# Patient Record
Sex: Female | Born: 1980 | Hispanic: No
Health system: Southern US, Academic
[De-identification: ages and names within clinical notes are randomized; demographics above are authoritative.]

## PROBLEM LIST (undated history)

## (undated) ENCOUNTER — Telehealth

## (undated) ENCOUNTER — Ambulatory Visit

## (undated) ENCOUNTER — Encounter

## (undated) ENCOUNTER — Encounter: Attending: Dermatology | Primary: Dermatology

## (undated) ENCOUNTER — Ambulatory Visit: Payer: MEDICAID | Attending: Dermatology | Primary: Dermatology

## (undated) ENCOUNTER — Ambulatory Visit: Payer: PRIVATE HEALTH INSURANCE

## (undated) ENCOUNTER — Ambulatory Visit: Payer: Medicaid (Managed Care)

## (undated) ENCOUNTER — Encounter
Attending: Student in an Organized Health Care Education/Training Program | Primary: Student in an Organized Health Care Education/Training Program

## (undated) ENCOUNTER — Ambulatory Visit: Attending: Gastroenterology | Primary: Gastroenterology

## (undated) ENCOUNTER — Telehealth: Attending: Dermatology | Primary: Dermatology

## (undated) ENCOUNTER — Ambulatory Visit: Payer: MEDICAID

## (undated) ENCOUNTER — Encounter: Attending: Internal Medicine | Primary: Internal Medicine

## (undated) ENCOUNTER — Encounter: Attending: Family | Primary: Family

## (undated) ENCOUNTER — Ambulatory Visit: Attending: Neurology | Primary: Neurology

## (undated) ENCOUNTER — Ambulatory Visit: Payer: MEDICAID | Attending: Retina Specialist | Primary: Retina Specialist

## (undated) ENCOUNTER — Telehealth: Attending: Ambulatory Care | Primary: Ambulatory Care

## (undated) ENCOUNTER — Ambulatory Visit
Payer: PRIVATE HEALTH INSURANCE | Attending: Student in an Organized Health Care Education/Training Program | Primary: Student in an Organized Health Care Education/Training Program

## (undated) ENCOUNTER — Ambulatory Visit
Payer: Medicaid (Managed Care) | Attending: Student in an Organized Health Care Education/Training Program | Primary: Student in an Organized Health Care Education/Training Program

## (undated) ENCOUNTER — Ambulatory Visit: Payer: MEDICAID | Attending: Rheumatology | Primary: Rheumatology

## (undated) ENCOUNTER — Ambulatory Visit: Payer: Medicaid (Managed Care) | Attending: Adult Health | Primary: Adult Health

## (undated) ENCOUNTER — Telehealth: Attending: Adult Health | Primary: Adult Health

## (undated) ENCOUNTER — Encounter: Attending: Neurology | Primary: Neurology

## (undated) ENCOUNTER — Ambulatory Visit: Attending: Physical Medicine & Rehabilitation | Primary: Physical Medicine & Rehabilitation

## (undated) ENCOUNTER — Telehealth
Attending: Student in an Organized Health Care Education/Training Program | Primary: Student in an Organized Health Care Education/Training Program

## (undated) ENCOUNTER — Encounter: Attending: Ambulatory Care | Primary: Ambulatory Care

## (undated) DIAGNOSIS — E079 Disorder of thyroid, unspecified: Secondary | ICD-10-CM

## (undated) DIAGNOSIS — F909 Attention-deficit hyperactivity disorder, unspecified type: Secondary | ICD-10-CM

## (undated) DIAGNOSIS — C9590 Leukemia, unspecified not having achieved remission: Secondary | ICD-10-CM

## (undated) DIAGNOSIS — Z22322 Carrier or suspected carrier of Methicillin resistant Staphylococcus aureus: Secondary | ICD-10-CM

## (undated) DIAGNOSIS — D649 Anemia, unspecified: Secondary | ICD-10-CM

## (undated) DIAGNOSIS — J45909 Unspecified asthma, uncomplicated: Secondary | ICD-10-CM

## (undated) DIAGNOSIS — M549 Dorsalgia, unspecified: Secondary | ICD-10-CM

## (undated) DIAGNOSIS — Z90710 Acquired absence of both cervix and uterus: Secondary | ICD-10-CM

## (undated) DIAGNOSIS — G43909 Migraine, unspecified, not intractable, without status migrainosus: Secondary | ICD-10-CM

## (undated) DIAGNOSIS — M419 Scoliosis, unspecified: Secondary | ICD-10-CM

## (undated) DIAGNOSIS — IMO0002 Reserved for concepts with insufficient information to code with codable children: Secondary | ICD-10-CM

## (undated) DIAGNOSIS — L732 Hidradenitis suppurativa: Secondary | ICD-10-CM

## (undated) DIAGNOSIS — R110 Nausea: Secondary | ICD-10-CM

## (undated) DIAGNOSIS — M329 Systemic lupus erythematosus, unspecified: Secondary | ICD-10-CM

## (undated) DIAGNOSIS — R51 Headache: Secondary | ICD-10-CM

## (undated) DIAGNOSIS — M1990 Unspecified osteoarthritis, unspecified site: Secondary | ICD-10-CM

## (undated) DIAGNOSIS — F32A Depression, unspecified: Secondary | ICD-10-CM

## (undated) DIAGNOSIS — F329 Major depressive disorder, single episode, unspecified: Secondary | ICD-10-CM

## (undated) DIAGNOSIS — R197 Diarrhea, unspecified: Secondary | ICD-10-CM

## (undated) DIAGNOSIS — R222 Localized swelling, mass and lump, trunk: Secondary | ICD-10-CM

## (undated) DIAGNOSIS — I10 Essential (primary) hypertension: Secondary | ICD-10-CM

## (undated) HISTORY — DX: Localized swelling, mass and lump, trunk: R22.2

## (undated) HISTORY — DX: Attention-deficit hyperactivity disorder, unspecified type: F90.9

## (undated) HISTORY — DX: Unspecified osteoarthritis, unspecified site: M19.90

## (undated) HISTORY — DX: Leukemia, unspecified not having achieved remission: C95.90

## (undated) HISTORY — DX: Diarrhea, unspecified: R19.7

## (undated) HISTORY — DX: Essential (primary) hypertension: I10

## (undated) HISTORY — PX: AXILLARY HIDRADENITIS EXCISION: SUR522

## (undated) HISTORY — DX: Headache: R51

## (undated) HISTORY — DX: Scoliosis, unspecified: M41.9

## (undated) HISTORY — DX: Migraine, unspecified, not intractable, without status migrainosus: G43.909

## (undated) HISTORY — DX: Dorsalgia, unspecified: M54.9

## (undated) HISTORY — DX: Nausea: R11.0

## (undated) HISTORY — DX: Acquired absence of both cervix and uterus: Z90.710

## (undated) HISTORY — DX: Anemia, unspecified: D64.9

## (undated) HISTORY — DX: Hidradenitis suppurativa: L73.2

---
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
## 2004-02-01 ENCOUNTER — Encounter (INDEPENDENT_AMBULATORY_CARE_PROVIDER_SITE_OTHER): Payer: Self-pay | Admitting: *Deleted

## 2004-02-01 LAB — CONVERTED CEMR LAB

## 2004-03-27 ENCOUNTER — Encounter: Admission: RE | Admit: 2004-03-27 | Discharge: 2004-03-27 | Payer: Self-pay | Admitting: Family Medicine

## 2004-04-25 ENCOUNTER — Encounter: Admission: RE | Admit: 2004-04-25 | Discharge: 2004-04-25 | Payer: Self-pay | Admitting: Sports Medicine

## 2004-04-27 ENCOUNTER — Encounter: Admission: RE | Admit: 2004-04-27 | Discharge: 2004-04-27 | Payer: Self-pay | Admitting: Family Medicine

## 2004-05-04 ENCOUNTER — Ambulatory Visit (HOSPITAL_COMMUNITY): Admission: RE | Admit: 2004-05-04 | Discharge: 2004-05-04 | Payer: Self-pay | Admitting: Sports Medicine

## 2004-05-29 ENCOUNTER — Encounter: Admission: RE | Admit: 2004-05-29 | Discharge: 2004-05-29 | Payer: Self-pay | Admitting: Family Medicine

## 2004-06-05 ENCOUNTER — Emergency Department (HOSPITAL_COMMUNITY): Admission: EM | Admit: 2004-06-05 | Discharge: 2004-06-05 | Payer: Self-pay | Admitting: Family Medicine

## 2004-06-07 ENCOUNTER — Encounter: Admission: RE | Admit: 2004-06-07 | Discharge: 2004-06-07 | Payer: Self-pay | Admitting: Family Medicine

## 2004-06-08 ENCOUNTER — Ambulatory Visit (HOSPITAL_COMMUNITY): Admission: RE | Admit: 2004-06-08 | Discharge: 2004-06-08 | Payer: Self-pay | Admitting: Sports Medicine

## 2004-06-13 ENCOUNTER — Encounter: Admission: RE | Admit: 2004-06-13 | Discharge: 2004-06-13 | Payer: Self-pay | Admitting: Family Medicine

## 2004-06-19 ENCOUNTER — Ambulatory Visit (HOSPITAL_COMMUNITY): Admission: RE | Admit: 2004-06-19 | Discharge: 2004-06-19 | Payer: Self-pay

## 2004-06-26 ENCOUNTER — Emergency Department (HOSPITAL_COMMUNITY): Admission: EM | Admit: 2004-06-26 | Discharge: 2004-06-26 | Payer: Self-pay | Admitting: Family Medicine

## 2004-07-10 ENCOUNTER — Encounter: Admission: RE | Admit: 2004-07-10 | Discharge: 2004-07-10 | Payer: Self-pay | Admitting: Sports Medicine

## 2004-07-11 ENCOUNTER — Emergency Department (HOSPITAL_COMMUNITY): Admission: EM | Admit: 2004-07-11 | Discharge: 2004-07-11 | Payer: Self-pay | Admitting: Family Medicine

## 2004-08-12 ENCOUNTER — Emergency Department (HOSPITAL_COMMUNITY): Admission: EM | Admit: 2004-08-12 | Discharge: 2004-08-13 | Payer: Self-pay

## 2005-01-22 ENCOUNTER — Emergency Department (HOSPITAL_COMMUNITY): Admission: EM | Admit: 2005-01-22 | Discharge: 2005-01-22 | Payer: Self-pay | Admitting: Family Medicine

## 2005-04-17 ENCOUNTER — Emergency Department (HOSPITAL_COMMUNITY): Admission: EM | Admit: 2005-04-17 | Discharge: 2005-04-17 | Payer: Self-pay | Admitting: Family Medicine

## 2005-07-27 ENCOUNTER — Emergency Department (HOSPITAL_COMMUNITY): Admission: EM | Admit: 2005-07-27 | Discharge: 2005-07-27 | Payer: Self-pay | Admitting: Family Medicine

## 2005-09-18 ENCOUNTER — Emergency Department (HOSPITAL_COMMUNITY): Admission: EM | Admit: 2005-09-18 | Discharge: 2005-09-18 | Payer: Self-pay | Admitting: Emergency Medicine

## 2005-10-30 ENCOUNTER — Emergency Department (HOSPITAL_COMMUNITY): Admission: EM | Admit: 2005-10-30 | Discharge: 2005-10-31 | Payer: Self-pay | Admitting: Emergency Medicine

## 2005-11-23 ENCOUNTER — Ambulatory Visit (HOSPITAL_COMMUNITY): Admission: RE | Admit: 2005-11-23 | Discharge: 2005-11-23 | Payer: Self-pay | Admitting: *Deleted

## 2006-01-07 ENCOUNTER — Ambulatory Visit (HOSPITAL_COMMUNITY): Admission: RE | Admit: 2006-01-07 | Discharge: 2006-01-07 | Payer: Self-pay | Admitting: *Deleted

## 2006-01-23 ENCOUNTER — Emergency Department (HOSPITAL_COMMUNITY): Admission: EM | Admit: 2006-01-23 | Discharge: 2006-01-23 | Payer: Self-pay | Admitting: Family Medicine

## 2006-04-14 ENCOUNTER — Emergency Department (HOSPITAL_COMMUNITY): Admission: AD | Admit: 2006-04-14 | Discharge: 2006-04-14 | Payer: Self-pay | Admitting: Family Medicine

## 2006-04-22 ENCOUNTER — Emergency Department (HOSPITAL_COMMUNITY): Admission: EM | Admit: 2006-04-22 | Discharge: 2006-04-22 | Payer: Self-pay | Admitting: *Deleted

## 2006-05-23 ENCOUNTER — Emergency Department (HOSPITAL_COMMUNITY): Admission: EM | Admit: 2006-05-23 | Discharge: 2006-05-23 | Payer: Self-pay | Admitting: Family Medicine

## 2006-06-16 ENCOUNTER — Inpatient Hospital Stay (HOSPITAL_COMMUNITY): Admission: AD | Admit: 2006-06-16 | Discharge: 2006-06-16 | Payer: Self-pay | Admitting: Obstetrics

## 2006-07-17 ENCOUNTER — Emergency Department (HOSPITAL_COMMUNITY): Admission: EM | Admit: 2006-07-17 | Discharge: 2006-07-17 | Payer: Self-pay | Admitting: Emergency Medicine

## 2006-07-28 ENCOUNTER — Inpatient Hospital Stay (HOSPITAL_COMMUNITY): Admission: AD | Admit: 2006-07-28 | Discharge: 2006-07-28 | Payer: Self-pay | Admitting: Obstetrics

## 2006-08-16 ENCOUNTER — Inpatient Hospital Stay (HOSPITAL_COMMUNITY): Admission: AD | Admit: 2006-08-16 | Discharge: 2006-08-17 | Payer: Self-pay | Admitting: Obstetrics

## 2006-08-30 ENCOUNTER — Inpatient Hospital Stay (HOSPITAL_COMMUNITY): Admission: AD | Admit: 2006-08-30 | Discharge: 2006-09-05 | Payer: Self-pay | Admitting: Obstetrics

## 2006-08-31 ENCOUNTER — Encounter (INDEPENDENT_AMBULATORY_CARE_PROVIDER_SITE_OTHER): Payer: Self-pay | Admitting: *Deleted

## 2006-11-05 ENCOUNTER — Emergency Department (HOSPITAL_COMMUNITY): Admission: EM | Admit: 2006-11-05 | Discharge: 2006-11-05 | Payer: Self-pay | Admitting: Family Medicine

## 2007-02-28 ENCOUNTER — Encounter (INDEPENDENT_AMBULATORY_CARE_PROVIDER_SITE_OTHER): Payer: Self-pay | Admitting: *Deleted

## 2007-03-31 ENCOUNTER — Emergency Department (HOSPITAL_COMMUNITY): Admission: EM | Admit: 2007-03-31 | Discharge: 2007-03-31 | Payer: Self-pay | Admitting: Family Medicine

## 2007-05-12 ENCOUNTER — Emergency Department (HOSPITAL_COMMUNITY): Admission: EM | Admit: 2007-05-12 | Discharge: 2007-05-12 | Payer: Self-pay | Admitting: Emergency Medicine

## 2007-10-26 ENCOUNTER — Emergency Department (HOSPITAL_COMMUNITY): Admission: EM | Admit: 2007-10-26 | Discharge: 2007-10-26 | Payer: Self-pay | Admitting: Emergency Medicine

## 2008-02-05 ENCOUNTER — Emergency Department (HOSPITAL_COMMUNITY): Admission: EM | Admit: 2008-02-05 | Discharge: 2008-02-05 | Payer: Self-pay | Admitting: Family Medicine

## 2008-03-26 ENCOUNTER — Emergency Department (HOSPITAL_COMMUNITY): Admission: EM | Admit: 2008-03-26 | Discharge: 2008-03-26 | Payer: Self-pay | Admitting: Family Medicine

## 2008-06-13 ENCOUNTER — Emergency Department (HOSPITAL_COMMUNITY): Admission: EM | Admit: 2008-06-13 | Discharge: 2008-06-13 | Payer: Self-pay | Admitting: Emergency Medicine

## 2008-06-17 ENCOUNTER — Other Ambulatory Visit: Admission: RE | Admit: 2008-06-17 | Discharge: 2008-06-17 | Payer: Self-pay | Admitting: Obstetrics & Gynecology

## 2008-06-23 ENCOUNTER — Emergency Department (HOSPITAL_COMMUNITY): Admission: EM | Admit: 2008-06-23 | Discharge: 2008-06-23 | Payer: Self-pay | Admitting: Emergency Medicine

## 2008-06-30 ENCOUNTER — Encounter: Payer: Self-pay | Admitting: Obstetrics & Gynecology

## 2008-06-30 ENCOUNTER — Inpatient Hospital Stay (HOSPITAL_COMMUNITY): Admission: RE | Admit: 2008-06-30 | Discharge: 2008-07-02 | Payer: Self-pay | Admitting: Obstetrics & Gynecology

## 2008-06-30 HISTORY — PX: ABDOMINAL HYSTERECTOMY: SHX81

## 2008-07-03 ENCOUNTER — Inpatient Hospital Stay (HOSPITAL_COMMUNITY): Admission: AD | Admit: 2008-07-03 | Discharge: 2008-07-03 | Payer: Self-pay | Admitting: Obstetrics & Gynecology

## 2008-07-24 ENCOUNTER — Emergency Department (HOSPITAL_COMMUNITY): Admission: EM | Admit: 2008-07-24 | Discharge: 2008-07-24 | Payer: Self-pay | Admitting: Emergency Medicine

## 2008-07-25 ENCOUNTER — Emergency Department (HOSPITAL_COMMUNITY): Admission: EM | Admit: 2008-07-25 | Discharge: 2008-07-26 | Payer: Self-pay | Admitting: Emergency Medicine

## 2008-08-30 ENCOUNTER — Emergency Department (HOSPITAL_COMMUNITY): Admission: EM | Admit: 2008-08-30 | Discharge: 2008-08-30 | Payer: Self-pay | Admitting: Family Medicine

## 2008-09-21 ENCOUNTER — Emergency Department (HOSPITAL_COMMUNITY): Admission: EM | Admit: 2008-09-21 | Discharge: 2008-09-21 | Payer: Self-pay | Admitting: Family Medicine

## 2008-10-12 ENCOUNTER — Emergency Department (HOSPITAL_COMMUNITY): Admission: EM | Admit: 2008-10-12 | Discharge: 2008-10-12 | Payer: Self-pay | Admitting: Emergency Medicine

## 2008-12-07 ENCOUNTER — Emergency Department (HOSPITAL_COMMUNITY): Admission: EM | Admit: 2008-12-07 | Discharge: 2008-12-07 | Payer: Self-pay | Admitting: Emergency Medicine

## 2008-12-31 DIAGNOSIS — Z22322 Carrier or suspected carrier of Methicillin resistant Staphylococcus aureus: Secondary | ICD-10-CM

## 2008-12-31 HISTORY — DX: Carrier or suspected carrier of methicillin resistant Staphylococcus aureus: Z22.322

## 2009-02-07 ENCOUNTER — Emergency Department (HOSPITAL_COMMUNITY): Admission: EM | Admit: 2009-02-07 | Discharge: 2009-02-07 | Payer: Self-pay | Admitting: Emergency Medicine

## 2009-02-11 ENCOUNTER — Inpatient Hospital Stay (HOSPITAL_COMMUNITY): Admission: EM | Admit: 2009-02-11 | Discharge: 2009-02-15 | Payer: Self-pay | Admitting: Internal Medicine

## 2009-02-12 ENCOUNTER — Ambulatory Visit: Payer: Self-pay | Admitting: Internal Medicine

## 2009-05-24 ENCOUNTER — Emergency Department (HOSPITAL_COMMUNITY): Admission: EM | Admit: 2009-05-24 | Discharge: 2009-05-24 | Payer: Self-pay | Admitting: Family Medicine

## 2009-07-19 ENCOUNTER — Emergency Department (HOSPITAL_COMMUNITY): Admission: EM | Admit: 2009-07-19 | Discharge: 2009-07-19 | Payer: Self-pay | Admitting: Emergency Medicine

## 2009-07-26 ENCOUNTER — Emergency Department (HOSPITAL_COMMUNITY): Admission: EM | Admit: 2009-07-26 | Discharge: 2009-07-26 | Payer: Self-pay | Admitting: Emergency Medicine

## 2009-08-25 ENCOUNTER — Emergency Department (HOSPITAL_COMMUNITY): Admission: EM | Admit: 2009-08-25 | Discharge: 2009-08-25 | Payer: Self-pay | Admitting: Emergency Medicine

## 2009-10-04 ENCOUNTER — Emergency Department (HOSPITAL_COMMUNITY): Admission: EM | Admit: 2009-10-04 | Discharge: 2009-10-04 | Payer: Self-pay | Admitting: Emergency Medicine

## 2009-12-02 ENCOUNTER — Ambulatory Visit (HOSPITAL_COMMUNITY): Admission: RE | Admit: 2009-12-02 | Discharge: 2009-12-02 | Payer: Self-pay | Admitting: General Surgery

## 2010-01-19 ENCOUNTER — Encounter: Payer: Self-pay | Admitting: Family Medicine

## 2010-02-15 ENCOUNTER — Ambulatory Visit (HOSPITAL_BASED_OUTPATIENT_CLINIC_OR_DEPARTMENT_OTHER): Admission: RE | Admit: 2010-02-15 | Discharge: 2010-02-15 | Payer: Self-pay | Admitting: General Surgery

## 2010-02-19 ENCOUNTER — Emergency Department (HOSPITAL_COMMUNITY): Admission: EM | Admit: 2010-02-19 | Discharge: 2010-02-19 | Payer: Self-pay | Admitting: Emergency Medicine

## 2010-03-17 ENCOUNTER — Encounter: Payer: Self-pay | Admitting: Family Medicine

## 2010-03-17 ENCOUNTER — Ambulatory Visit: Payer: Self-pay | Admitting: Family Medicine

## 2010-03-17 DIAGNOSIS — K219 Gastro-esophageal reflux disease without esophagitis: Secondary | ICD-10-CM | POA: Insufficient documentation

## 2010-04-13 ENCOUNTER — Encounter: Payer: Self-pay | Admitting: Family Medicine

## 2010-04-18 ENCOUNTER — Encounter: Payer: Self-pay | Admitting: Family Medicine

## 2010-04-18 ENCOUNTER — Ambulatory Visit: Payer: Self-pay | Admitting: Family Medicine

## 2010-04-18 DIAGNOSIS — F17200 Nicotine dependence, unspecified, uncomplicated: Secondary | ICD-10-CM | POA: Insufficient documentation

## 2010-04-18 LAB — CONVERTED CEMR LAB
BUN: 14 mg/dL (ref 6–23)
CO2: 21 meq/L (ref 19–32)
Calcium: 8.9 mg/dL (ref 8.4–10.5)
Chlamydia, DNA Probe: NEGATIVE
Chloride: 103 meq/L (ref 96–112)
Creatinine, Ser: 0.66 mg/dL (ref 0.40–1.20)
GC Probe Amp, Genital: NEGATIVE
Glucose, Bld: 91 mg/dL (ref 70–99)
HCV Ab: NEGATIVE
Hep B S Ab: NEGATIVE
Hepatitis B Surface Ag: NEGATIVE
Pap Smear: NEGATIVE
Potassium: 3.7 meq/L (ref 3.5–5.3)
Sodium: 135 meq/L (ref 135–145)

## 2010-04-20 ENCOUNTER — Ambulatory Visit: Payer: Self-pay | Admitting: Family Medicine

## 2010-04-23 ENCOUNTER — Emergency Department (HOSPITAL_COMMUNITY): Admission: EM | Admit: 2010-04-23 | Discharge: 2010-04-23 | Payer: Self-pay | Admitting: Emergency Medicine

## 2010-04-25 ENCOUNTER — Encounter: Payer: Self-pay | Admitting: Family Medicine

## 2010-05-01 ENCOUNTER — Encounter: Payer: Self-pay | Admitting: Family Medicine

## 2010-05-01 ENCOUNTER — Ambulatory Visit: Payer: Self-pay | Admitting: Family Medicine

## 2010-05-01 DIAGNOSIS — F909 Attention-deficit hyperactivity disorder, unspecified type: Secondary | ICD-10-CM | POA: Insufficient documentation

## 2010-05-08 ENCOUNTER — Telehealth: Payer: Self-pay | Admitting: *Deleted

## 2010-05-15 ENCOUNTER — Telehealth: Payer: Self-pay | Admitting: *Deleted

## 2010-05-31 ENCOUNTER — Ambulatory Visit: Payer: Self-pay | Admitting: Family Medicine

## 2010-06-28 ENCOUNTER — Ambulatory Visit: Payer: Self-pay | Admitting: Family Medicine

## 2010-06-28 DIAGNOSIS — F341 Dysthymic disorder: Secondary | ICD-10-CM | POA: Insufficient documentation

## 2010-07-17 ENCOUNTER — Ambulatory Visit: Payer: Self-pay | Admitting: Family Medicine

## 2010-07-24 ENCOUNTER — Telehealth: Payer: Self-pay | Admitting: Family Medicine

## 2010-08-15 ENCOUNTER — Ambulatory Visit: Payer: Self-pay | Admitting: Family Medicine

## 2010-08-15 ENCOUNTER — Telehealth: Payer: Self-pay | Admitting: Family Medicine

## 2010-08-15 DIAGNOSIS — M549 Dorsalgia, unspecified: Secondary | ICD-10-CM

## 2010-08-15 DIAGNOSIS — G8929 Other chronic pain: Secondary | ICD-10-CM | POA: Insufficient documentation

## 2010-08-28 ENCOUNTER — Telehealth: Payer: Self-pay | Admitting: Family Medicine

## 2010-08-31 ENCOUNTER — Telehealth: Payer: Self-pay | Admitting: Family Medicine

## 2010-09-01 ENCOUNTER — Ambulatory Visit: Payer: Self-pay | Admitting: Family Medicine

## 2010-09-01 ENCOUNTER — Encounter: Payer: Self-pay | Admitting: Family Medicine

## 2010-09-01 DIAGNOSIS — R609 Edema, unspecified: Secondary | ICD-10-CM | POA: Insufficient documentation

## 2010-09-01 DIAGNOSIS — R000 Tachycardia, unspecified: Secondary | ICD-10-CM | POA: Insufficient documentation

## 2010-09-01 DIAGNOSIS — I10 Essential (primary) hypertension: Secondary | ICD-10-CM | POA: Insufficient documentation

## 2010-09-01 LAB — CONVERTED CEMR LAB
ALT: <8 units/L (ref 0–35)
AST: 11 units/L (ref 0–37)
Albumin: 4.3 g/dL (ref 3.5–5.2)
Alkaline Phosphatase: 62 units/L (ref 39–117)
BUN: 13 mg/dL (ref 6–23)
CO2: 22 meq/L (ref 19–32)
Calcium: 9.1 mg/dL (ref 8.4–10.5)
Chloride: 106 meq/L (ref 96–112)
Cholesterol: 141 mg/dL (ref 0–200)
Creatinine, Ser: 0.76 mg/dL (ref 0.40–1.20)
Glucose, Bld: 86 mg/dL (ref 70–99)
HCT: 44.8 % (ref 36.0–46.0)
HDL: 29 mg/dL — ABNORMAL LOW (ref >39)
Hemoglobin: 15.1 g/dL — ABNORMAL HIGH (ref 12.0–15.0)
LDL Cholesterol: 93 mg/dL (ref 0–99)
MCHC: 33.7 g/dL (ref 30.0–36.0)
MCV: 85.7 fL (ref 78.0–100.0)
Platelets: 176 10*3/uL (ref 150–400)
Potassium: 3.9 meq/L (ref 3.5–5.3)
RBC: 5.23 M/uL — ABNORMAL HIGH (ref 3.87–5.11)
RDW: 14.1 % (ref 11.5–15.5)
Sodium: 136 meq/L (ref 135–145)
TSH: 1.906 microintl units/mL (ref 0.350–4.500)
Total Bilirubin: 0.3 mg/dL (ref 0.3–1.2)
Total CHOL/HDL Ratio: 4.9
Total Protein: 7 g/dL (ref 6.0–8.3)
Triglycerides: 96 mg/dL (ref <150)
VLDL: 19 mg/dL (ref 0–40)
WBC: 6.3 10*3/uL (ref 4.0–10.5)

## 2010-09-06 ENCOUNTER — Encounter: Payer: Self-pay | Admitting: Family Medicine

## 2010-09-06 ENCOUNTER — Ambulatory Visit: Payer: Self-pay | Admitting: Family Medicine

## 2010-09-06 LAB — CONVERTED CEMR LAB
BUN: 12 mg/dL (ref 6–23)
CO2: 22 meq/L (ref 19–32)
Calcium: 8.9 mg/dL (ref 8.4–10.5)
Chloride: 105 meq/L (ref 96–112)
Creatinine, Ser: 0.76 mg/dL (ref 0.40–1.20)
Glucose, Bld: 73 mg/dL (ref 70–99)
Potassium: 4 meq/L (ref 3.5–5.3)
Sodium: 137 meq/L (ref 135–145)

## 2010-09-08 ENCOUNTER — Telehealth: Payer: Self-pay | Admitting: Family Medicine

## 2010-09-11 ENCOUNTER — Encounter
Admission: RE | Admit: 2010-09-11 | Discharge: 2010-11-01 | Payer: Self-pay | Source: Home / Self Care | Admitting: Family Medicine

## 2010-09-27 ENCOUNTER — Encounter: Payer: Self-pay | Admitting: Family Medicine

## 2010-09-28 ENCOUNTER — Ambulatory Visit: Payer: Self-pay | Admitting: Family Medicine

## 2010-11-16 ENCOUNTER — Emergency Department (HOSPITAL_COMMUNITY): Admission: EM | Admit: 2010-11-16 | Discharge: 2010-11-16 | Payer: Self-pay | Admitting: Emergency Medicine

## 2010-12-05 ENCOUNTER — Encounter: Payer: Self-pay | Admitting: Family Medicine

## 2010-12-07 ENCOUNTER — Ambulatory Visit: Payer: Self-pay

## 2010-12-25 ENCOUNTER — Emergency Department (HOSPITAL_COMMUNITY)
Admission: EM | Admit: 2010-12-25 | Discharge: 2010-12-26 | Payer: Self-pay | Source: Home / Self Care | Admitting: Emergency Medicine

## 2011-01-08 ENCOUNTER — Ambulatory Visit: Admit: 2011-01-08 | Payer: Self-pay

## 2011-01-21 ENCOUNTER — Encounter: Payer: Self-pay | Admitting: Sports Medicine

## 2011-01-30 NOTE — Progress Notes (Signed)
Summary: Lab Res  Phone Note Call from Patient Call back at Home Phone [phone]   Caller: Patient Summary of Call: Checking on lab work from Wednesday. Initial call taken by: Clydell Hakim,  September 08, 2010 9:26 AM  Follow-up for Phone Call        will forward to MD. Follow-up by: Theresia Lo RN,  September 08, 2010 12:53 PM  Additional Follow-up for Phone Call Additional follow up Details #1::        all normal. Additional Follow-up by: Helane Rima DO,  September 08, 2010 3:21 PM    Additional Follow-up for Phone Call Additional follow up Details #2::    patient notified. Follow-up by: Theresia Lo RN,  September 11, 2010 10:17 AM

## 2011-01-30 NOTE — Miscellaneous (Signed)
Summary: ROI  ROI   Imported By: De Nurse 05/19/2010 16:42:33  _____________________________________________________________________  External Attachment:    Type:   Image     Comment:   External Document

## 2011-01-30 NOTE — Progress Notes (Signed)
Summary: Rx Prob  Phone Note Call from Patient   Caller: Patient Summary of Call: Pt at pharmacy and says that rx is not there that Dr. Edmonia James sent in to [facility]. Initial call taken by: Clydell Hakim,  August 15, 2010 4:32 PM  Follow-up for Phone Call        to pcp to complete Follow-up by: Golden Circle RN,  August 15, 2010 4:56 PM  Additional Follow-up for Phone Call Additional follow up Details #1::        Rx sent electronically- now signed.

## 2011-01-30 NOTE — Miscellaneous (Signed)
  Clinical Lists Changes  Problems: Removed problem of ANXIETY STATE, UNSPECIFIED (ICD-300.00) Removed problem of FATIGUE (ICD-780.79) Removed problem of ANXIETY (ICD-300.00)      Allergies: 1)  ! Sulfa 2)  Percocet
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
## 2011-01-30 NOTE — Progress Notes (Signed)
Summary: triage  Phone Note Call from Patient Call back at Home Phone [phone]   Caller: Patient Summary of Call: feet and ankles swollen x 1 wk.  not sure if she needs to come in Initial call taken by: De Nurse,  August 31, 2010 11:51 AM  Follow-up for Phone Call        LM Follow-up by: Golden Circle RN,  August 31, 2010 12:10 PM  Additional Follow-up for Phone Call Additional follow up Details #1::        states both feet & ankles are swollen. her bp was up she states. cannot come in today. appt at 8:30am. advised no salt or salty foods. drink water not soda & keep feet elevated Additional Follow-up by: Golden Circle RN,  August 31, 2010 12:28 PM

## 2011-01-30 NOTE — Consult Note (Signed)
Summary: [facility]   Imported By: Clydell Hakim 04/20/2010 13:46:13  _____________________________________________________________________  External Attachment:    Type:   Image     Comment:   External Document

## 2011-01-30 NOTE — Progress Notes (Signed)
Summary: rx req  Phone Note Call from Patient Call back at Home Phone [phone]   Caller: Patient Summary of Call: Needs new rx for Ritalin dosage was upped to 10mg s. Initial call taken by: Clydell Hakim,  July 24, 2010 10:25 AM  Follow-up for Phone Call        fwd. to dr.Milah Recht Follow-up by: Arlyss Repress CMA,,  July 24, 2010 11:22 AM  Additional Follow-up for Phone Call Additional follow up Details #1::        rx refilled.New Rx: METHYLPHENIDATE HCL 10 MG TABS (METHYLPHENIDATE HCL) Take 1 tablet in AM and 1 tablet at 2pm for ADHD  #60 x 0      New/Updated Medications: METHYLPHENIDATE HCL 10 MG TABS (METHYLPHENIDATE HCL) Take 1 tablet in AM and 1 tablet at 2pm for ADHD Prescriptions: METHYLPHENIDATE HCL 10 MG TABS (METHYLPHENIDATE HCL) Take 1 tablet in AM and 1 tablet at 2pm for ADHD  #60 x 0   Entered and Authorized by:   Ellin Mayhew MD   Signed by:   Ellin Mayhew MD on 07/27/2010   Method used:   Print then Give to Patient   RxID:   [id number]

## 2011-01-30 NOTE — Progress Notes (Signed)
Summary: refill request  Phone Note Refill Request Message from:  Patient  Refills Requested: Medication #1:  METHYLPHENIDATE HCL 10 MG TABS Take 1 tablet in AM and 1 tablet at 2pm for ADHD Please call when ready  Initial call taken by: De Nurse,  August 28, 2010 9:06 AM  Follow-up for Phone Call        dosage decreased at visit on 9/2. Rx refilled at that visit. Ellin Mayhew MD  September 07, 2010 4:25 PM

## 2011-01-30 NOTE — Consult Note (Signed)
Summary: [facility]   Imported By: De Nurse 11/21/2010 10:50:22  _____________________________________________________________________  External Attachment:    Type:   Image     Comment:   External Document
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
## 2011-01-30 NOTE — Assessment & Plan Note (Signed)
Summary: ADHD/anxiety   Vital Signs:  Patient profile:   30 year old female Height:      60.25 inches Weight:      141.9 pounds BMI:     27.58 Temp:     98.2 degrees F oral Pulse rate:   98 / minute BP sitting:   123 / 88  (left arm) Cuff size:   regular  Vitals Entered By: Garen Grams LPN (September 28, 2010 9:56 AM) CC: f/u meds Is Patient Diabetic? No Pain Assessment Patient in pain? no        Primary Care Provider:  Ellin Mayhew MD  CC:  f/u meds.  History of Present Illness: ADHD: Pt states that she initially thought that the medicine was helping but now she is convinced that it is not really helping her concentration.  Since starting the ritilan she has increased anxiety, especially at school.  She has racing thoughts and has a harder time focusing.  She also endorses irritablility.  Her spouse wants her to stop taking the medicine because he feels like this all got worse when she started on the ritalin. She is concerned that the ritalin is the medicine causing the increase in the heart rate.  Pt would like to stop the ritalin.  Anxiety/depression: Pt states that she has always had an anxiety and depression problem.  Has never taking any medication except for the amitriptyline.  She feels like the amitriptyline has really helped her depression and that her depression is really not an issue for her right now.  Yet, her anxiety has become acutely worsened since she started school and in particular since she started the ritalin.  pt has never had therapy for her anxiety or depression.  Habits & Providers  Alcohol-Tobacco-Diet     Alcohol drinks/day: 0     Tobacco Status: current     Tobacco Counseling: to quit use of tobacco products     Cigarette Packs/Day: 1.0  Current Medications (verified): 1)  Fioricet 50-325-40 Mg Tabs (Butalbital-Apap-Caffeine) .Marland Kitchen.. 1 Tab By Mouth Q 6 Hrs As Needed Headache 2)  Amitriptyline Hcl 50 Mg Tabs (Amitriptyline Hcl) .Marland Kitchen.. 1 Tab By  Mouth Qhs For Depression 3)  Flexeril 5 Mg Tabs (Cyclobenzaprine Hcl) .... Take 1 Tablet Three Times A Day As Needed For Back Pain 4)  Lisinopril 10 Mg  Tabs (Lisinopril) .... Take 1 Tab By Mouth Daily  Allergies (verified): 1)  ! Sulfa 2)  Percocet  Review of Systems       No fever. No SOB. No Si or Hi. as per hpi  Physical Exam  General:  VSS Well-developed,well-nourished,in no acute distress; alert,appropriate and cooperative throughout examination Lungs:  Normal respiratory effort, chest expands symmetrically.  Heart:  Normal rate and regular rhythm. S1 and S2 normal without gallop, murmur, click, rub or other extra sounds. Extremities:  no edema Skin:  Intact without suspicious lesions or rashes Psych:  Cognition and judgment appear intact. Alert and cooperative with normal attention span and concentration. No apparent delusions, illusions, hallucinations   Impression & Recommendations:  Problem # 1:  ADHD (ICD-314.01) Pt requests to stop ritalin.  I think that this may be a good idea since she is having increased anxiety, increased hr (98 today, also see previous visits), and increased irratability with very minimal improvement in concentration per pt.  Talked with pharmacy who said it would be safe for pt to stop without wean at this dosage.  Pt to stop taking today.  Pt  plans to contact Dr. Pascal Lux for an appt to discuss coping skills for test anxiety.  And for general mental health eval since she has never seen a psychologist for anxiety/depression or ADHD diagnosis.   Pt would like to focus on behavioral change since she feels her situation has worsened with the ritalin.   Orders: [facility]- Est  Level 4 ([address])  Problem # 2:  ANXIETY (ICD-300.00) Pt will continue to take amitriptyline since she feels this has helped her depression that she has had in the past.  If elevated hr continues may consider switching her to another antidepressant.   Her updated medication list for this  problem includes:    Amitriptyline Hcl 50 Mg Tabs (Amitriptyline hcl) .Marland Kitchen... 1 tab by mouth qhs for depression  Orders: [facility]- Est  Level 4 ([address])  Complete Medication List: 1)  Fioricet 50-325-40 Mg Tabs (Butalbital-apap-caffeine) .Marland Kitchen.. 1 tab by mouth q 6 hrs as needed headache 2)  Amitriptyline Hcl 50 Mg Tabs (Amitriptyline hcl) .Marland Kitchen.. 1 tab by mouth qhs for depression 3)  Flexeril 5 Mg Tabs (Cyclobenzaprine hcl) .... Take 1 tablet three times a day as needed for back pain 4)  Lisinopril 10 Mg Tabs (Lisinopril) .... Take 1 tab by mouth daily  Patient Instructions: 1)  I recommend you make an appt with Dr. Pascal Lux, our therapist here at the clinic,  to work on study skills in the setting of your ADHD, anxiety, and depression. 2)  GET A GOOD NIGHTS REST!!! 3)  Stop taking medication for ADHD.

## 2011-01-30 NOTE — Assessment & Plan Note (Signed)
Summary: f/up ritalin   Vital Signs:  Patient profile:   30 year old female Weight:      141.4 pounds Pulse rate:   88 / minute BP sitting:   116 / 80  (right arm)  Vitals Entered By: Arlyss Repress CMA, (July 17, 2010 1:33 PM) CC: discuss Ritalin. started on 5mg  06-28-10. feels no difference. Is Patient Diabetic? No Pain Assessment Patient in pain? no        Primary Care Provider:  Ardeen Garland  MD  CC:  discuss Ritalin. started on 5mg  06-28-10. feels no difference.Marland Kitchen  History of Present Illness: Pt states that she is here to follow up on ritalin dosage.  She statest that she has not had any improvement in focus or ability to concentrate.  She also denies any adverse side effect of the medication. No h/a, no insomnia, no irritablity, no decrease in appetite.   Habits & Providers  Alcohol-Tobacco-Diet     Tobacco Status: current     Tobacco Counseling: to quit use of tobacco products  Current Medications (verified): 1)  Fioricet 50-325-40 Mg Tabs (Butalbital-Apap-Caffeine) .Marland Kitchen.. 1 Tab By Mouth Q 6 Hrs As Needed Headache 2)  Amitriptyline Hcl 50 Mg Tabs (Amitriptyline Hcl) .Marland Kitchen.. 1 Tab By Mouth Qhs For Depression 3)  Methylphenidate Hcl 5 Mg Tabs (Methylphenidate Hcl) .Marland Kitchen.. 1 Tab By Mouth Q Am and 1 Tab By Mouth Q 2pm For Adhd  Allergies (verified): No Known Drug Allergies  Review of Systems       ros per hpi  Physical Exam  General:  VSS Well-developed,well-nourished,in no acute distress; alert,appropriate and cooperative throughout examination Lungs:  Normal respiratory effort, chest expands symmetrically. Msk:  normal gait Extremities:  no edema Psych:  Oriented X3, memory intact for recent and remote, normally interactive, good eye contact, not anxious appearing, not depressed appearing, not agitated, not suicidal, and not homicidal.     Impression & Recommendations:  Problem # 1:  ADHD (ICD-314.01) Increased doseage of ritalin to 10mg  at 8am and again mid  afternoon.  Will have pt return in 2-3 weeks to reassess how she is doing with this dosage.  If we are able to find a dosage that works well for her, i will consider switching her to something more long acting like concerta.  Will reconsider this once we find a dosage that works for her.   Orders: [facility]- Est Level  3 ([address])  Complete Medication List: 1)  Fioricet 50-325-40 Mg Tabs (Butalbital-apap-caffeine) .Marland Kitchen.. 1 tab by mouth q 6 hrs as needed headache 2)  Amitriptyline Hcl 50 Mg Tabs (Amitriptyline hcl) .Marland Kitchen.. 1 tab by mouth qhs for depression 3)  Methylphenidate Hcl 5 Mg Tabs (Methylphenidate hcl) .Marland Kitchen.. 1 tab by mouth q am and 1 tab by mouth q 2pm for adhd  Patient Instructions: 1)  return in 2-3 weeks for follow up appt. 2)  Take ritalin 10mg - once at 8am and again mid afternoon. 3)  Look out for any side effects or benefits in order to report back to me.  4)  Very nice meeting you today!

## 2011-01-30 NOTE — Assessment & Plan Note (Signed)
Summary: back pain/adhd   Vital Signs:  Patient profile:   30 year old female Height:      60.25 inches Weight:      141 pounds BMI:     27.41 Temp:     98.4 degrees F oral Pulse rate:   92 / minute BP sitting:   134 / 97  (left arm) Cuff size:   regular  Vitals Entered By: Tessie Fass CMA (August 15, 2010 2:10 PM) CC: F/U back pain Is Patient Diabetic? No Pain Assessment Patient in pain? yes     Location: back Intensity: 8   Primary Care Provider:  Ardeen Garland  MD  CC:  F/U back pain.  History of Present Illness: chronic back pain: Pt reports back pain x years.  Recently moved to another home and back pain increased.  Has seen Dr. Georgiana Shore in past and it was mentioned that she might be a good canidate for PT.  Pt is requesting a PT consult today.  no fever.  No urinary or stool incontience.    Habits & Providers  Alcohol-Tobacco-Diet     Tobacco Status: current     Tobacco Counseling: to quit use of tobacco products     Cigarette Packs/Day: 1.5  Current Medications (verified): 1)  Fioricet 50-325-40 Mg Tabs (Butalbital-Apap-Caffeine) .Marland Kitchen.. 1 Tab By Mouth Q 6 Hrs As Needed Headache 2)  Amitriptyline Hcl 50 Mg Tabs (Amitriptyline Hcl) .Marland Kitchen.. 1 Tab By Mouth Qhs For Depression 3)  Methylphenidate Hcl 10 Mg Tabs (Methylphenidate Hcl) .... Take 1 Tablet in Am and 1 Tablet At 2pm For Adhd 4)  Flexeril 5 Mg Tabs (Cyclobenzaprine Hcl) .... Take 1 Tablet Three Times A Day As Needed For Back Pain  Allergies (verified): No Known Drug Allergies  Social History: Packs/Day:  1.5  Review of Systems       as per hpi  Physical Exam  General:  Well-developed,well-nourished,in no acute distress; alert,appropriate and cooperative throughout examination Lungs:  Normal respiratory effort, chest expands symmetrically. Lungs are clear to auscultation, no crackles or wheezes. Heart:  Normal rate and regular rhythm. S1 and S2 normal without gallop, murmur, click, rub or other extra  sounds. Msk:  tenderness to palpation over lower back paraspinal muscles.  normal rom.  strength, sensation, and reflexes equal bilateral.   Impression & Recommendations:  Problem # 1:  BACK PAIN (ICD-724.5) pt can take motrin for pain as she has been as well as flexeril for muscle relaxation.  reviewed side effects of flexeril.  Pt states understanding.  This is for short term use only for acute relief until pt can be established in PT and work on Therapist, art the weakened areas.  Pt consult placed.   Her updated medication list for this problem includes:    Fioricet 50-325-40 Mg Tabs (Butalbital-apap-caffeine) .Marland Kitchen... 1 tab by mouth q 6 hrs as needed headache    Flexeril 5 Mg Tabs (Cyclobenzaprine hcl) .Marland Kitchen... Take 1 tablet three times a day as needed for back pain  Orders: [facility]- Est  Level 4 ([address]) Physical Therapy Referral (PT)  Problem # 2:  TOBACCO USER (ICD-305.1) Pt couseled to stop smoking.  Showed some interest in smoking cessation classes.  pt given handout.  Orders: [facility]- Est  Level 4 ([address]8119)  Problem # 3:  ADHD (ICD-314.01) pt doing well with current ritalin dosage.  Talked about switching to long acting concerta but pt concerned that insurance won't cover and it will be more expensive.  Since pt is happy  with symptom relief currently will continue with current regimen.    Orders: [facility]- Est  Level 4 ([address])  Complete Medication List: 1)  Fioricet 50-325-40 Mg Tabs (Butalbital-apap-caffeine) .Marland Kitchen.. 1 tab by mouth q 6 hrs as needed headache 2)  Amitriptyline Hcl 50 Mg Tabs (Amitriptyline hcl) .Marland Kitchen.. 1 tab by mouth qhs for depression 3)  Methylphenidate Hcl 10 Mg Tabs (Methylphenidate hcl) .... Take 1 tablet in am and 1 tablet at 2pm for adhd 4)  Flexeril 5 Mg Tabs (Cyclobenzaprine hcl) .... Take 1 tablet three times a day as needed for back pain  Patient Instructions: 1)  Will contact you with physical therapy appt. 2)  Take flexeril as directed until your PT appt.  Remember  side effects of drowsiness.  3)  return as needed for follow up. Prescriptions: FLEXERIL 5 MG TABS (CYCLOBENZAPRINE HCL) take 1 tablet three times a day as needed for back pain  #45 x 0   Entered and Authorized by:   Ellin Mayhew MD   Signed by:   Ellin Mayhew MD on 08/16/2010   Method used:   Electronically to        [facility] (retail)       [address]       [place], [place]  [address]       Ph: [phone]       Fax: [phone]   RxID:   [id number]    Prevention & Chronic Care Immunizations   Influenza vaccine: Not documented    Tetanus booster: 07/01/2003: Done.    Pneumococcal vaccine: Not documented  Other Screening   Pap smear: NEGATIVE FOR INTRAEPITHELIAL LESIONS OR MALIGNANCY.  (04/18/2010)   Pap smear due: 04/19/2011   Smoking status: current  (08/15/2010)   Smoking cessation counseling: yes  (08/15/2010)

## 2011-01-30 NOTE — Assessment & Plan Note (Signed)
Summary: needs meds,df   Vital Signs:  Patient profile:   30 year old female Weight:      137 pounds Pulse rate:   83 / minute BP sitting:   129 / 93  (right arm)  Vitals Entered By: Arlyss Repress CMA, (May 01, 2010 11:13 AM) CC: f/up psychologist. recommendation for meds. ADHD. very stressed due to job loss. Is Patient Diabetic? No Pain Assessment Patient in pain? no        Primary Care Provider:  Ardeen Garland  MD  CC:  f/up psychologist. recommendation for meds. ADHD. very stressed due to job loss.Marland Kitchen  History of Present Illness: [name] comes in to discuss ADHD, follow-up BP and poison ivy. 1) ADHD - states was diagnosed with this at [facility] by [employer] Cowboy.  States she called their office asking them to fax records here but they have not yet been received.  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 2) BP - hypertensive after last pregnancy.  Lisinopril-HCTZ stopped at last appt because she hadn't been taking it and her BP was good.  Here for recheck.  Diastolic borderline but otherwise okay. 3) Poison ivy - seen her for poison ivy.  GIven 5 d steroid burst.  Helped some but not completely so went to urgent care where she got a "shot" and it is almost gone now.   Habits & Providers  Alcohol-Tobacco-Diet     Tobacco Status: current     Tobacco Counseling: to quit use of tobacco products     Cigarette Packs/Day: 1.0  Allergies: No Known Drug Allergies  Physical Exam  General:  VS noted. thin, alert, NAD Skin:  resolving erythematous rash on flexor areas of forearms.  FAce clear Psych:  Oriented X3, memory intact for recent and remote, normally interactive, good eye contact, not anxious appearing, and not depressed appearing.     Impression & Recommendations:  Problem # 1:  ADHD (ICD-314.01) Assessment New  Still awaiting records.  SHe filled out ROI which will be faxed to their office.  She will erturn for  discussion of medication after I receive the records.   Orders: [facility]- Est Level  3 ([address])  Problem # 2:  CONTACT DERMATITIS&OTHER ECZEMA DUE TO PLANTS (ICD-692.6) Assessment: Improved  IMproved Her updated medication list for this problem includes:    Prednisone 50 Mg Tabs (Prednisone) .Marland Kitchen... 1 by mouth daily x 5 days  Orders: [facility]- Est Level  3 ([address])  Problem # 3:  ELEVATED BLOOD PRESSURE (ICD-796.2) Assessment: Unchanged  Still at normal range.  REcheck again at next visit.   Orders: [facility]- Est Level  3 ([address])  Complete Medication List: 1)  Omeprazole 40 Mg Cpdr (Omeprazole) .Marland Kitchen.. 1 tab by mouth daily 2)  Ondansetron Hcl 4 Mg Tabs (Ondansetron hcl) .Marland Kitchen.. 1 tab by mouth q 6 hrs as needed nausea 3)  Prednisone 50 Mg Tabs (Prednisone) .Marland Kitchen.. 1 by mouth daily x 5 days

## 2011-01-30 NOTE — Assessment & Plan Note (Signed)
Summary: F/U VISIT/[facility]   Vital Signs:  Patient profile:   30 year old female Height:      60.25 inches Weight:      133 pounds BMI:     25.85 Temp:     98.4 degrees F oral Pulse rate:   103 / minute BP sitting:   116 / 85  (left arm) Cuff size:   regular  Vitals Entered By: San Morelle, SMA CC: cpe and pap smear Pain Assessment Patient in pain? yes     Location: back Intensity: 5   Primary Care Provider:  Ardeen Garland  MD  CC:  cpe and pap smear.  History of Present Illness: [name] comes in today for CPE with pap.  She was seen as a new patient last month.  Her stomach was bothering her then.  She was started on omeprazole and zofran and she is feeling better.  She has been taking HCTZ 12.5 since her last child for high blood pressure but she has not taken it in several days and her BP is good and would like to know if she can come off of it. 1) CPE - Had hysterectomy because her uterus was "worn out".  Cannot tell me more about this.  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  Her cousin was diagnosed with cervical cancer.  No pap since her hyst.  Wants checked for all StDs.   Habits & Providers  Alcohol-Tobacco-Diet     Tobacco Status: current     Tobacco Counseling: to quit use of tobacco products     Cigarette Packs/Day: 1.0  Past History:  Past Medical History: Last updated: 03/17/2010 Depression - sees Rob Programmer, multimedia at [employer] H/O kidney stones H/O ulcers Migraines H/O Hidradenitis ADHD anxiety Leukemia at age 8.5  Past Surgical History: Last updated: 03/17/2010 exploratory lap 6/03 - endometriosis - 03/27/2004, I+D - groin abscess 3/05 - 03/27/2004, pelvic CT - wnl - 06/20/2004, renal U/S - simple cyst  R kidney - 06/20/2004  BTL in 2008 Hysterectomy for endometriosis in 2009  Family History: Last updated: 02/27/2007 brother - MI age 85 from crack use, dad - CAD MI age 70, HTN, mom - CAD, CABG  age 40, DM, HTN, sister - crack use  Social History: Last updated: 03/17/2010 Lives w/ husband and her two kids.  +tob - 1.5ppd since 1999.  No EtOH.  No street drugs but h/o ecstasy use.  Social History: Packs/Day:  1.0  Physical Exam  General:  thin, alert, NAD vitals reviewed Eyes:  pupils equal, pupils round, corneas and lenses clear, and no injection.   Ears:  R ear normal and L ear normal.   Nose:  mucosa edematous and pale. clear rhinorrhea Mouth:  OP clear and moist Neck:  No deformities, masses, or tenderness noted. Lungs:  Normal respiratory effort, chest expands symmetrically. Lungs are clear to auscultation, no crackles or wheezes. Heart:  Normal rate and regular rhythm. S1 and S2 normal without gallop, murmur, click, rub or other extra sounds. Abdomen:  Bowel sounds positive,abdomen soft and non-tender without masses, organomegaly or hernias noted. Genitalia:  scar tissue evident at vaginal cuff.  No cervix.  No uterus.  Unable to palpate adnexae.  No pain with exam.  Pulses:  2+ radial and dp Extremities:  no edema Cervical Nodes:  No lymphadenopathy noted   Impression & Recommendations:  Problem # 1:  ROUTINE GYNECOLOGICAL EXAMINATION (ICD-V72.31)  Pap obtained from  vaginal cuff.  No need to do yearly.  Needs pelvic exam yearly, but pap infrequently, if at all as long as pelvic exam normal.    Orders: [facility] - Est  18-39 yrs ([address])  Problem # 2:  COMMUNICABLE DISEASE, EXPOSURE TO (ICD-V01.9) STD tests pending.  Orders: GC/Chlamydia-[facility] ([id number]) Wet Prep- [facility] [id number]) RPR-[facility] [phone]) HIV-[facility] [phone]) Hep Bs Ab-[facility] [phone]) Hep Bs Ag-[facility] ([id number]) Hep C Ab-[facility] ([id number])  Problem # 3:  ENCOUNTER FOR LONG-TERM USE OF OTHER MEDICATIONS (ICD-V58.69) Has been taking HCTZ.  Will check BMET today.   Orders: Basic Met-[facility] ([id number])  Problem # 4:  HTN H/O hypertension - likely related to pregnancy.  Okay to stop meds as BP is  normal off of them.  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  Will monitor herself and if begins to elevate she will return.   Problem # 5:  TOBACCO USER (ICD-305.1) Desires to quit.  Applauded!!  REferred to our free smoking cessation class.   Complete Medication List: 1)  Omeprazole 40 Mg Cpdr (Omeprazole) .Marland Kitchen.. 1 tab by mouth daily 2)  Ondansetron Hcl 4 Mg Tabs (Ondansetron hcl) .Marland Kitchen.. 1 tab by mouth q 6 hrs as needed nausea  Other Orders: Pap Smear-[facility] ([id number])  Appended Document: wet prep    Lab Visit  Laboratory Results  Date/Time Received: April 18, 2010 4:31 PM  Date/Time Reported: April 18, 2010 4:50 PM   [employer] Source: vag WBC/hpf: rare Bacteria/hpf: 3+  Rods Clue cells/hpf: none  Negative whiff Yeast/hpf: none Trichomonas/hpf: none Comments: ...............test performed by......Marland KitchenBonnie A. [place], MLS (ASCP)cm   Orders Today:

## 2011-01-30 NOTE — Progress Notes (Signed)
Summary: re: records from [employer] Note Call from Patient Call back at [facility] Phone [phone]   Caller: Patient Summary of Call: Checking to see if Dr. Roslynn Amble got her records from [facility]. Initial call taken by: Clydell Hakim,  May 08, 2010 12:22 PM  Follow-up for Phone Call        FWD. TO DR.MAYANS Follow-up by: Arlyss Repress CMA,,  May 08, 2010 5:10 PM  Additional Follow-up for Phone Call Additional follow up Details #1::        Not yet.  We faxed request.  She could call their office again. Give her our fax number so she can give it to them.   Additional Follow-up by: Lamar Laundry, MD May 10th, 2011    Additional Follow-up for Phone Call Additional follow up Details #2::    called pt and lmvm .. see message from dr.mayans. Follow-up by: Arlyss Repress CMA,,  May 12, 2010 11:56 AM

## 2011-01-30 NOTE — Letter (Signed)
Summary: Generic Letter  [facility]  [address]   [place], [place] [address]   Phone: [phone]  Fax: [phone]    04/25/2010  [name] [address] [place], [place]  [address]  Dear Ms. [name],  I am happy to inform you that your pap smear was normal.  Given you have a hysterectomy, you will not need another pap smear next year, just a pelvic exam and the rest of your complete physical. Also, all of your STD tests were negative.  If you have any questions, please call our office.          Sincerely,   Ardeen Garland  MD  Appended Document: Generic Letter mailed

## 2011-01-30 NOTE — Assessment & Plan Note (Signed)
Summary: poison ivey,tcb   Vital Signs:  Patient profile:   30 year old female Height:      60.25 inches Weight:      135.2 pounds Temp:     98.4 degrees F oral BP sitting:   119 / 89  (right arm) Cuff size:   regular  Vitals Entered By: San Morelle, SMA CC: Pt believe she has been exposed to poision oak or ivey. It is on both arms and eye lids Pain Assessment Patient in pain? yes     Location: head Intensity: 10   Primary Care Provider:  Ardeen Garland  MD  CC:  Pt believe she has been exposed to poision oak or ivey. It is on both arms and eye lids.  History of Present Illness: RASH Location: Bilateral flexeral elbows, and forarms, as well as bilateral eyelids. Onset: This morning Description: Itchy and red Modifying factors: Has not been directly exposed to poision ivy. However hier children's clothes may have been contanimated.   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 1 Tab By Mouth Daily 2)  Ondansetron Hcl 4 Mg Tabs (Ondansetron Hcl) .Marland Kitchen.. 1 Tab By Mouth Q 6 Hrs As Needed Nausea 3)  Prednisone 50 Mg Tabs (Prednisone) .Marland Kitchen.. 1 By Mouth Daily X 5  Days  Allergies (verified): No Known Drug Allergies  Past History:  Past Medical History: Last updated: 03/17/2010 Depression - sees Rob Programmer, multimedia at [facility] H/O kidney stones H/O ulcers Migraines H/O Hidradenitis ADHD anxiety Leukemia at age [age]  Past Surgical History: Last updated: 03/17/2010 exploratory lap 6/03 - endometriosis - 03/27/2004, I+D - groin abscess 3/05 - 03/27/2004, pelvic CT - wnl - 06/20/2004, renal U/S - simple cyst  R kidney - 06/20/2004  BTL in 2008 Hysterectomy for endometriosis in 2009  Family History: Last updated: 02/27/2007 brother - MI age 88 from crack use, dad - CAD MI age 40, HTN, mom - CAD, CABG age 70, DM, HTN, sister - crack use  Social History: Last updated: 03/17/2010 Lives w/ husband and her two kids.  +tob - 1.5ppd since 1999.  No EtOH.  No street drugs but h/o ecstasy use.  Risk Factors: Smoking Status: current (04/20/2010) Packs/Day: 1.0 (04/20/2010)  Review of Systems       The patient complains of headaches.  The patient denies fever, weight loss, weight gain, vision loss, hoarseness, chest pain, syncope, peripheral edema, abdominal pain, severe indigestion/heartburn, muscle weakness, and suspicious skin lesions.    Physical Exam  General:  VS noted. Uncomfortable appearing woman Eyes:  Upper eyelids are mildly erythemetus and edemetus.  PERRL, No conjenctival or scleral injection or inflimation. Mouth:  OP clear and moist Neck:  No deformities, masses, or tenderness  noted. Lungs:  Normal respiratory effort, chest expands symmetrically. Lungs are clear to auscultation, no crackles or wheezes. Msk:  No elbow, hand or knee tenderness to palpation. Skin:  Eythemia in the flexural elbows and decending down into the volar forarms.  Some linear streaks and individual areas. No vesicular lesions noted. No other rashes.    Impression & Recommendations:  Problem # 1:  CONTACT DERMATITIS&OTHER ECZEMA DUE TO PLANTS  (ICD-692.6)  Likely was exposed to poision ivy via her children's clothing.  As she has involvement of the eyelids will avoid topicals.  Will use a steroid burst for 5 days.  If symptoms return following the burst plan for a trial of a 2-3 week taper.  Will also use PPIs or H2 blockers with steroids to avoid gastritis or ulcers.  Red flags provided.  Pt will return to clinic or go to the hospital for trouble breathing or systemic illness.    Her updated medication list for this problem includes:    Prednisone 50 Mg Tabs (Prednisone) .Marland Kitchen... 1 by mouth daily x 5 days  Orders: [facility]- Est Level  3 (09811)  Complete Medication List: 1)  Omeprazole 40 Mg Cpdr (Omeprazole) .Marland Kitchen.. 1 tab by mouth daily 2)  Ondansetron Hcl 4 Mg Tabs (Ondansetron hcl) .Marland Kitchen.. 1 tab by mouth q 6 hrs as needed nausea 3)  Prednisone 50 Mg Tabs (Prednisone) .Marland Kitchen.. 1 by mouth daily x 5 days  Patient Instructions: 1)  Thank you for seeing me today. 2)  Please take the prednisone for 5 days. 3)  If you are not feeling better in 1-2 days or get worse or it comes back after the meds please come back. 4)  If you have trouble breathing please go to the hospital.  5)  If you start feeling really bad please let me know.  6)  Please continue take omeprazole or ranitidine (Zantac) while taking the staroids.  Prescriptions: PREDNISONE 50 MG TABS (PREDNISONE) 1 by mouth daily x 5 days  #5 x 0   Entered and Authorized by:   Clementeen Graham MD   Signed by:   Clementeen Graham MD on 04/20/2010   Method used:   Print then Give to Patient   RxID:   [id number]
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
## 2011-01-30 NOTE — Progress Notes (Signed)
Summary: pt needs appt/ts  ---- Converted from flag ---- ---- 05/15/2010 3:07 PM, Ardeen Garland  MD wrote: Can you please let Ms. Andrey Campanile know we have received the records from [facility] and she may schedule an appt to discuss these resutls at any time.  THanks! ------------------------------ called pt and lmvm to sched. ov with dr.[email or web address] Repress CMA,  May 15, 2010 4:07 PM

## 2011-01-30 NOTE — Assessment & Plan Note (Signed)
Summary: NP,tcb   Vital Signs:  Patient profile:   30 year old female Height:      60.25 inches Temp:     97.9 degrees F oral Pulse rate:   105 / minute BP sitting:   121 / 89  (left arm) Cuff size:   regular  Vitals Entered By: Tessie Fass CMA (March 17, 2010 1:58 PM) CC: NP Is Patient Diabetic? No Pain Assessment Patient in pain? no        CC:  NP.  History of Present Illness: Ms. [name] is a new patient to our clinic.  She was being seen at the [facility] but has lost her medicaid.  She is in the process of applying with [facility].  She is here today to establish care as well as discuss stomach issues.  Meds, PMH, FH, SH all updated in [facility].   1) Stomach- History of bleeding ulcers at age 30.  Has problems with heartburn off and on but has not been on anything.  Occassionally takes her husband's "acid reducer" which helps teh heartburn sensation but still feels nauseated often.  Heartburn is worse with spicy foods.  Has been bothering her again for 6 months, almost everyday.  Occassionally has diarrhea but no blood or mucus.  Occassionally vomits but no blood.   Habits & Providers  Alcohol-Tobacco-Diet     Tobacco Status: current     Cigarette Packs/Day: 1.5  Past History:  Past Medical History: Depression - sees Lucky Cowboy at [facility] H/O kidney stones H/O ulcers Migraines H/O Hidradenitis ADHD anxiety Leukemia at age 30  Past Surgical History: exploratory lap 6/03 - endometriosis - 03/27/2004, I+D - groin abscess 3/05 - 03/27/2004, pelvic CT - wnl - 06/20/2004, renal U/S - simple cyst  R kidney - 06/20/2004  BTL in 2008 Hysterectomy for endometriosis in 2009  Social History: Lives w/ husband and her two kids.  +tob - 1.5ppd since 1999.  No EtOH.  No street drugs but h/o ecstasy use.Smoking Status:  current Packs/Day:  1.5   Impression & Recommendations:  Problem # 1:  GERD (ICD-530.81) Assessment New Try  Omeprazole.  Her updated medication list for this problem includes:    Omeprazole 40 Mg Cpdr (Omeprazole) .Marland Kitchen... 1 tab by mouth daily  Problem # 2:  NAUSEA (ICD-787.02) Assessment: New Zofran as needed until omeprazole kicks in.  Her updated medication list for this problem includes:    Ondansetron Hcl 4 Mg Tabs (Ondansetron hcl) .Marland Kitchen... 1 tab by mouth q 6 hrs as needed nausea  Complete Medication List: 1)  Omeprazole 40 Mg Cpdr (Omeprazole) .Marland Kitchen.. 1 tab by mouth daily 2)  Ondansetron Hcl 4 Mg Tabs (Ondansetron hcl) .Marland Kitchen.. 1 tab by mouth q 6 hrs as needed nausea  Patient Instructions: 1)  It was nice to meet you today. 2)  Please try the omeprazole for your stomach and the ondansetron for you nausea. 3)  I would like to see you back in 4-6 weeks for a full physical and to see how your stomach is doing.  4)  In the meantime, please make an appointment with [facility].  If you need help from [place] to get set up back over at [facility] please let me know.  They may still let you keep your previous referral/appointment though.  Prescriptions: ONDANSETRON HCL 4 MG TABS (ONDANSETRON HCL) 1 tab by mouth q 6 hrs as needed nausea  #21 x 2   Entered and  Authorized by:   Ardeen Garland  MD   Signed by:   Ardeen Garland  MD on 03/17/2010   Method used:   Print then Give to Patient   RxID:   [id number] OMEPRAZOLE 40 MG CPDR (OMEPRAZOLE) 1 tab by mouth daily  #30 x 2   Entered and Authorized by:   Ardeen Garland  MD   Signed by:   Ardeen Garland  MD on 03/17/2010   Method used:   Print then Give to Patient   RxID:   [id number]

## 2011-02-02 NOTE — Miscellaneous (Signed)
Summary: ROI  ROI   Imported By: Clydell Hakim 03/21/2010 16:11:36  _____________________________________________________________________  External Attachment:    Type:   Image     Comment:   External Document

## 2011-02-02 NOTE — Consult Note (Signed)
Summary: Pysch Eval  Pysch Eval   Imported By: De Nurse 05/19/2010 16:42:16  _____________________________________________________________________  External Attachment:    Type:   Image     Comment:   External Document

## 2011-02-06 ENCOUNTER — Encounter: Payer: Self-pay | Admitting: *Deleted

## 2011-03-07 ENCOUNTER — Ambulatory Visit: Payer: Self-pay | Admitting: Family Medicine

## 2011-03-12 ENCOUNTER — Ambulatory Visit: Payer: Self-pay | Admitting: Family Medicine

## 2011-03-12 LAB — CBC
MCH: 28.2 pg (ref 26.0–34.0)
Platelets: 177 10*3/uL (ref 150–400)
RBC: 5.24 MIL/uL — ABNORMAL HIGH (ref 3.87–5.11)
WBC: 6.4 10*3/uL (ref 4.0–10.5)

## 2011-03-12 LAB — URINALYSIS, ROUTINE W REFLEX MICROSCOPIC
Hgb urine dipstick: NEGATIVE
Nitrite: NEGATIVE
Specific Gravity, Urine: 1.017 (ref 1.005–1.030)
Urobilinogen, UA: 0.2 mg/dL (ref 0.0–1.0)

## 2011-03-12 LAB — POCT I-STAT, CHEM 8
Creatinine, Ser: 0.9 mg/dL (ref 0.4–1.2)
HCT: 49 % — ABNORMAL HIGH (ref 36.0–46.0)
Hemoglobin: 16.7 g/dL — ABNORMAL HIGH (ref 12.0–15.0)
Potassium: 4 mEq/L (ref 3.5–5.1)
Sodium: 138 mEq/L (ref 135–145)

## 2011-03-12 LAB — DIFFERENTIAL
Basophils Absolute: 0.1 10*3/uL (ref 0.0–0.1)
Eosinophils Absolute: 0.1 10*3/uL (ref 0.0–0.7)
Lymphs Abs: 2.3 10*3/uL (ref 0.7–4.0)
Neutrophils Relative %: 54 % (ref 43–77)

## 2011-03-13 LAB — POCT URINALYSIS DIPSTICK
Nitrite: POSITIVE — AB
Protein, ur: NEGATIVE mg/dL
pH: 5.5 (ref 5.0–8.0)

## 2011-03-13 LAB — URINE CULTURE: Culture  Setup Time: 201111172243

## 2011-03-13 LAB — URINALYSIS, ROUTINE W REFLEX MICROSCOPIC
Glucose, UA: NEGATIVE mg/dL
Protein, ur: NEGATIVE mg/dL
Specific Gravity, Urine: 1.019 (ref 1.005–1.030)
pH: 6 (ref 5.0–8.0)

## 2011-03-13 LAB — URINE MICROSCOPIC-ADD ON

## 2011-03-21 LAB — DIFFERENTIAL
Lymphs Abs: 1.9 10*3/uL (ref 0.7–4.0)
Monocytes Relative: 7 % (ref 3–12)
Neutro Abs: 5.2 10*3/uL (ref 1.7–7.7)
Neutrophils Relative %: 67 % (ref 43–77)

## 2011-03-21 LAB — CBC
MCV: 86.5 fL (ref 78.0–100.0)
RBC: 4.37 MIL/uL (ref 3.87–5.11)
WBC: 7.8 10*3/uL (ref 4.0–10.5)

## 2011-03-21 LAB — BASIC METABOLIC PANEL
Calcium: 8.8 mg/dL (ref 8.4–10.5)
Chloride: 101 mEq/L (ref 96–112)
Creatinine, Ser: 0.63 mg/dL (ref 0.4–1.2)
GFR calc Af Amer: >60 mL/min (ref >60)

## 2011-03-21 LAB — PROTIME-INR: INR: 1.11 (ref 0.00–1.49)

## 2011-03-27 ENCOUNTER — Inpatient Hospital Stay (INDEPENDENT_AMBULATORY_CARE_PROVIDER_SITE_OTHER)
Admission: RE | Admit: 2011-03-27 | Discharge: 2011-03-27 | Disposition: A | Discharge status: Home / Self Care | Payer: Self-pay | Source: Ambulatory Visit | Attending: Family Medicine | Admitting: Family Medicine

## 2011-03-27 DIAGNOSIS — S93409A Sprain of unspecified ligament of unspecified ankle, initial encounter: Secondary | ICD-10-CM

## 2011-03-28 ENCOUNTER — Ambulatory Visit (INDEPENDENT_AMBULATORY_CARE_PROVIDER_SITE_OTHER): Payer: Self-pay | Admitting: Family Medicine

## 2011-03-28 ENCOUNTER — Encounter: Payer: Self-pay | Admitting: Family Medicine

## 2011-03-28 VITALS — BP 120/86 | HR 88 | Temp 98.2°F | Wt 139.5 lb

## 2011-03-28 DIAGNOSIS — S93409A Sprain of unspecified ligament of unspecified ankle, initial encounter: Secondary | ICD-10-CM

## 2011-03-28 DIAGNOSIS — S93402A Sprain of unspecified ligament of left ankle, initial encounter: Secondary | ICD-10-CM | POA: Insufficient documentation

## 2011-03-28 MED ORDER — TRAMADOL HCL 50 MG PO TABS: 50.0000 mg | ORAL_TABLET | Freq: Four times a day (QID) | ORAL | Status: DC | PRN

## 2011-03-28 NOTE — Progress Notes (Signed)
  Subjective:    Patient ID: [name], female    DOB: 24-Dec-1981, 30 y.o.   MRN: [id number]  HPI 1. Left ankle injury:  Pt fell though a vent at home yesterday.  The vent fell down quite a bit and she scraped the inside of her knee on the edge.  She thinks that she twisted her ankle when her leg fell through the vent.  She describes an eversion injury but she is unsure.  [facility] where [facility] her with "an ankle sprain and torn ligaments".  She was put in an ankle brace and advised to follow up here in 2-3 weeks.  She was not given anything for pain.  She comes to [facility] today because she is still in pain.  The pain is in the left ankle.  Located in the lateral / posterior part of the ankle.  She has been wearing the ankle brace and has been able to ambulate, but it has been painful.   Review of Systems Denies bruising, cyanosis, foot pain, swelling, redness    Objective:   Physical Exam  Constitutional: She appears well-nourished. No distress.  Cardiovascular: Normal rate and regular rhythm.   Pulmonary/Chest: Effort normal and breath sounds normal.  Musculoskeletal:       Left ankle:  No swelling, redness, or bruising.  Normal ROM.  Tendons appear intact.  Full strength in all direction.  N/V intact.  No bony tenderness.  Able to ambulate with an antalgic gait.  Left knee:  Small scrape on the medial part of her knee.  Full ROM.  Non tender  Left hip:  Full ROM  Skin:       No open areas          Assessment & Plan:

## 2011-03-28 NOTE — Patient Instructions (Signed)
I think that you sprained your ankle There is no sign of torn ligaments from what I can tell.  If there is, they will definitely heal on their own I have provided a prescription for crutches because of the pain with walking.  I think that you will feel a lot better in 1 week Please schedule a follow up appointment in 1 week

## 2011-03-28 NOTE — Assessment & Plan Note (Signed)
Appears to be a mild sprain.  Full ROM, full strength.  No bruising, swelling or redness.  Nontender.  Able to ambulate.  I think that she is exaggerating her pain.  Her main problem is with ambulation so will write for crutches for the next week or so.  Will also give her some Ultram for the pain.

## 2011-04-03 ENCOUNTER — Ambulatory Visit (INDEPENDENT_AMBULATORY_CARE_PROVIDER_SITE_OTHER): Payer: Self-pay | Admitting: Family Medicine

## 2011-04-03 ENCOUNTER — Encounter: Payer: Self-pay | Admitting: Family Medicine

## 2011-04-03 DIAGNOSIS — S93409A Sprain of unspecified ligament of unspecified ankle, initial encounter: Secondary | ICD-10-CM

## 2011-04-03 DIAGNOSIS — T7491XA Unspecified adult maltreatment, confirmed, initial encounter: Secondary | ICD-10-CM

## 2011-04-03 DIAGNOSIS — IMO0002 Reserved for concepts with insufficient information to code with codable children: Secondary | ICD-10-CM

## 2011-04-03 DIAGNOSIS — S93402A Sprain of unspecified ligament of left ankle, initial encounter: Secondary | ICD-10-CM

## 2011-04-03 LAB — DIFFERENTIAL
Eosinophils Absolute: 0.1 10*3/uL (ref 0.0–0.7)
Eosinophils Relative: 2 % (ref 0–5)
Lymphs Abs: 1.8 10*3/uL (ref 0.7–4.0)
Monocytes Relative: 10 % (ref 3–12)

## 2011-04-03 LAB — CBC
HCT: 46.3 % — ABNORMAL HIGH (ref 36.0–46.0)
MCV: 84.4 fL (ref 78.0–100.0)
RBC: 5.49 MIL/uL — ABNORMAL HIGH (ref 3.87–5.11)
WBC: 5.6 10*3/uL (ref 4.0–10.5)

## 2011-04-03 LAB — BASIC METABOLIC PANEL
Chloride: 105 mEq/L (ref 96–112)
GFR calc Af Amer: >60 mL/min (ref >60)
Potassium: 4.3 mEq/L (ref 3.5–5.1)
Sodium: 135 mEq/L (ref 135–145)

## 2011-04-03 NOTE — Patient Instructions (Signed)
Continue to move ankle- do alphabet exercises Use the ankle brace as needed for a couple of more days then try to walk without it. Use motrin or tylenol as needed.

## 2011-04-04 DIAGNOSIS — IMO0002 Reserved for concepts with insufficient information to code with codable children: Secondary | ICD-10-CM | POA: Insufficient documentation
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
## 2011-04-04 NOTE — Assessment & Plan Note (Signed)
Healing well.  Use air cast only 1-2 more days then walk/ambulate without if able.  Pt to do ankle PT at home (see pt instructions).  Return if new or worsening symptoms
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
## 2011-04-07 LAB — CBC
MCHC: 33.2 g/dL (ref 30.0–36.0)
MCV: 83.7 fL (ref 78.0–100.0)
RBC: 4.83 MIL/uL (ref 3.87–5.11)

## 2011-04-07 LAB — DIFFERENTIAL
Basophils Relative: 1 % (ref 0–1)
Eosinophils Absolute: 0.1 10*3/uL (ref 0.0–0.7)
Eosinophils Relative: 2 % (ref 0–5)
Monocytes Relative: 5 % (ref 3–12)
Neutrophils Relative %: 64 % (ref 43–77)

## 2011-04-07 LAB — POCT I-STAT, CHEM 8
Calcium, Ion: 1.11 mmol/L — ABNORMAL LOW (ref 1.12–1.32)
Glucose, Bld: 82 mg/dL (ref 70–99)
HCT: 44 % (ref 36.0–46.0)
Hemoglobin: 15 g/dL (ref 12.0–15.0)
Potassium: 3.5 mEq/L (ref 3.5–5.1)
TCO2: 22 mmol/L (ref 0–100)

## 2011-04-10 LAB — CULTURE, ROUTINE-ABSCESS

## 2011-04-17 LAB — WOUND CULTURE: Gram Stain: NONE SEEN

## 2011-04-17 LAB — DIFFERENTIAL
Basophils Relative: 1 % (ref 0–1)
Eosinophils Absolute: 0.2 10*3/uL (ref 0.0–0.7)
Lymphs Abs: 1.8 10*3/uL (ref 0.7–4.0)
Monocytes Absolute: 0.2 10*3/uL (ref 0.1–1.0)
Monocytes Relative: 4 % (ref 3–12)

## 2011-04-17 LAB — CULTURE, BLOOD (ROUTINE X 2)
Culture: NO GROWTH
Culture: NO GROWTH

## 2011-04-17 LAB — BASIC METABOLIC PANEL
CO2: 25 mEq/L (ref 19–32)
Chloride: 107 mEq/L (ref 96–112)
GFR calc Af Amer: >60 mL/min (ref >60)
Potassium: 3.3 mEq/L — ABNORMAL LOW (ref 3.5–5.1)
Sodium: 138 mEq/L (ref 135–145)

## 2011-04-17 LAB — CBC
HCT: 39.4 % (ref 36.0–46.0)
Hemoglobin: 12.8 g/dL (ref 12.0–15.0)
MCHC: 32.5 g/dL (ref 30.0–36.0)
MCV: 82 fL (ref 78.0–100.0)
RBC: 4.81 MIL/uL (ref 3.87–5.11)
WBC: 5.5 10*3/uL (ref 4.0–10.5)

## 2011-05-15 NOTE — Discharge Summary (Signed)
[name]               ACCOUNT NO.:  [id number]   MEDICAL RECORD NO.:  [id number]          PATIENT TYPE:  INP   LOCATION:  1307                         FACILITY:  [facility]   PHYSICIAN:  Charlestine Massed, MDDATE OF BIRTH:  Apr 03, 1981   DATE OF ADMISSION:  02/11/2009  DATE OF DISCHARGE:  02/14/2009                               DISCHARGE SUMMARY   REASON FOR ADMISSION:  Drainage from right ear, sent by PMD, Dr.Harvette  jenkins   HISTORY OF PRESENTING ILLNESS:  Ms. [name] is a 30 year old  female who came to the emergency room after being sent by the primary  care doctor, Dr. Della Goo, for complaints of worsening redness,  swelling, and drainage from her right TM which started in September  2009.  Patient has had a total of 4 episodes of drainage from the right  ear and every time she takes antibiotics it decreases but it comes back.  She denies any fever, no shortness of breath.  There is tenderness on  earlobe but patient does not have any hearing deficiency in both ears.   Patient has prior history of hidradenitis suppurativa on the body for  which she has had antibiotics and has been treated before.  She was  suspected to have MRSA cellulitis and so she was sent to the emergency  room.   In the emergency room, patient did not have any fever.  She had some  generalized weakness.  She had fevers and chills on and off as per the  history and physical but she did not have any fevers in the hospital.   Patient was evaluated and admitted for otitis externa, recurrent, to  rule out any ear issues.  She was started on vancomycin and  ciprofloxacin and then Ciprodex drops were also started on her.  She  improved considerably on that.   ID consult was sought in view of this existing illness, was seen by Dr.  Orvan Falconer who advised to continue the current medications and follow up  the cultures.   Patient had one of the cultures coming back as one of the ears  showing  few Staphylococcus aureus but sensitivity is still pending.   I discussed this with Dr. Orvan Falconer who said in view of the patient  having lack of insurance and improper followup possibility we will  discharge the patient on doxycycline p.o. and there is no need for Zyvox  as the patient can have high side effects and diminished proper followup  all the time so patient will be discharged on doxycycline.  I am still  waiting for the sensitivity of the organism and once that is seen  tomorrow we will decide on either doxycycline versus if it is  methicillin-sensitive type can be discharged on Augmentin.  We will  decide on that tomorrow.   DISCHARGE DIAGNOSES:  1. Otitis externa, right ear.  2. Hidradenitis suppurativa.  3. History of anemia and migraine.  4. History of hysterectomy and C-section.   ALLERGIES:  SHE IS ALLERGIC TO:  1. SULFA.  2. PERCOCET.   DISCHARGE MEDICATIONS:  Will be dictated  at the time of discharge.   DISCHARGE INSTRUCTIONS:  1. To follow up with primary care doctor in 1 week.  2. To see Dr. Pollyann Kennedy at [facility] as outpatient before Wednesday.      I have spoken to Dr. Pollyann Kennedy and he has instructed patient to call      and see as soon as she gets out because ENT consult was called and      because of the unavailability of her advanced instruments to be for      evaluation here.  Dr. Pollyann Kennedy has agreed to see her in the office and      he feels that office visit is enough for this and there is no      urgent necessity for an inpatient consult so to call [phone] and      as secretary to let Dr. Pollyann Kennedy see her and patient will be      instructed at the time of discharge.   FOLLOWUP:  1. To follow up with Dr. Della Goo.  2. To follow up with Dr. Pollyann Kennedy after discharge and antibiotics for a      total of 14 days.      Charlestine Massed, MD  Electronically Signed     UT/MEDQ  D:  02/14/2009  T:  02/14/2009  Job:  [id number]   cc:    Della Goo, M.D.  Fax: [phone]

## 2011-05-15 NOTE — H&P (Signed)
[name], [name]               ACCOUNT NO.:  [id number]   MEDICAL RECORD NO.:  [id number]          PATIENT TYPE:  INP   LOCATION:  1307                         FACILITY:  [facility]   PHYSICIAN:  Della Goo, M.D. DATE OF BIRTH:  07/29/1981   DATE OF ADMISSION:  02/11/2009  DATE OF DISCHARGE:                              HISTORY & PHYSICAL   PRIMARY CARE PHYSICIAN:  Dr. Della Goo.   CHIEF COMPLAINT:  Worsening infection, right ear and all over body.   HISTORY OF PRESENT ILLNESS:  This is a 30 year old female who presents  to my office and was seen with complaints of worsening redness,  swelling, drainage from her right ear.  The patient had been seen at the  emergency department recently was placed on doxycycline therapy for  treatment and told that she had a MRSA cellulitis.  The patient reports  the redness, swelling and pain have increased.  She also reports having  areas on her body, back, and chest areas which are pustular and also has  an increasing area under her right arm pit.  The patient reports having  generalized weakness and malaise and states that she has had fevers and  chills off and on.   The patient states that she has had problems with the right ear since  September 2009 and has been on multiple courses of antibiotics as an  outpatient and states that temporarily the symptoms will improve, but  then return.   PAST MEDICAL HISTORY:  Significant for history of recurrent ear  infections of the right ear, history of anemia and migraine headaches.   PAST SURGICAL HISTORY:  History of a hysterectomy in 2007, 2 previous C-  sections in 2007 and 2008, laparoscopic surgery in 2008, and open  reduction and internal fixation of the left arm at age 78.   Her medications at this time include doxycycline 100 mg 1 p.o. b.i.d.   ALLERGIES:  To SULFA and to PERCOCET.   SOCIAL HISTORY:  The patient is married with 2 children ages, 16-1/2  years old and 01/01/28 years  old.  Positive tobacco history 1 pack daily.  Alcohol history, denies. Illicit drug usage, the patient denies.   FAMILY HISTORY:  Positive for hypertension and diabetes mellitus type 2  and coronary artery disease in her mother and cancer in her maternal  grandmother, who had pancreatic cancer.   REVIEW OF SYSTEMS:  Pertinents are mentioned above.  The patient does  report having headache, chills, dizziness. Also reports having chest  discomfort, constipation and arthralgias and myalgias..  She also  reports having nausea, no vomiting.  No diarrhea.  No constipation.  No  dysuria.  No urinary retention.  She denies having any syncope or  seizures.   PHYSICAL EXAMINATION FINDINGS:  GENERAL APPEARANCE:  This is a 27-year-  old, sickly-appearing, well-nourished, well-developed female in  discomfort but no acute distress currently.  VITAL SIGNS:  Her vital signs in the office were 98.4, blood pressure  120/72, heart rate 86, respirations 20.  Her height is 62 inches.  Her  weight is 126 pounds.  HEENT: Normocephalic, atraumatic.  Pupils are equally round and reactive  to light.  Extraocular movements are intact. Funduscopic benign.  There  is no scleral icterus. The right external ear is erythematous and  inflamed and scaling with purulent drainage.  Tympanic membranes  visualized and revealed no visual perforation. Nares are patent  bilaterally.  Oropharynx is clear.  NECK: Supple with full range of motion.  No thyromegaly, adenopathy, or  jugular venous distention.  CARDIOVASCULAR:  Regular rate and rhythm.  No murmurs, gallops or rubs.  LUNGS: Clear to auscultation bilaterally.  ABDOMEN: Positive bowel sounds.  There are sparse, discrete, pustular  areas across the abdomen. Also on the back area.  Soft, nontender,  nondistended.  There is a surgical scar which is horizontal consistent  with her GYN surgeries.  NEUROLOGIC:  Alert and oriented x3.  There are no focal deficits on   examination.   ASSESSMENT:  A 30 year old female being directly admitted for treatment  for methicillin-resistant Staphylococcus aureus  cellulitis which is  worsening despite outpatient antibiotic therapy of doxycycline.   PLAN:  The patient will be admitted and IV vancomycin and IV Unasyn  therapy will be ordered. Case management will be consulted to assist  this patient and getting antibiotic therapy as an outpatient which will  have to be continued.  Consultations by ear, nose, throat and possibly  infectious diseases will be considered.  The patient will be placed on  IV fluids, and DVT and GI prophylaxis have been ordered along with other  p.r.n. medications for pain or fever.  Further workup will ensue pending  results of the patient's clinical course.      Della Goo, M.D.  Electronically Signed     HJ/MEDQ  D:  02/11/2009  T:  02/11/2009  Job:  [id number]   cc:   Della Goo, M.D.  Fax: [phone]

## 2011-05-15 NOTE — Op Note (Signed)
[name], [name]               ACCOUNT NO.:  [id number]   MEDICAL RECORD NO.:  [id number]          PATIENT TYPE:  INP   LOCATION:  A313                          FACILITY:  [facility]   PHYSICIAN:  Lazaro Arms, M.D.   DATE OF BIRTH:  1981/10/21   DATE OF PROCEDURE:  06/30/2008  DATE OF DISCHARGE:                               OPERATIVE REPORT   PREOPERATIVE DIAGNOSES:  1. Midline chronic pelvic pain with no lateral pain.  2. Midline dyspareunia.  3. Menometrorrhagia.  4. Dysmenorrhea.   POSTOPERATIVE DIAGNOSES:  1. Midline chronic pelvic pain with no lateral pain.  2. Midline dyspareunia.  3. Menometrorrhagia.  4. Dysmenorrhea.   PROCEDURE:  Abdominal hysterectomy.   SURGEON:  Lazaro Arms, MD   ANESTHESIA:  General endotracheal.   FINDINGS:  The patient had some adhesions of the bladder to the lower  uterine segment.  There was a little bit of old scar and endometriosis  in that.  There was no active endometriosis anywhere.  The ovaries  appeared perfectly normal.  There was no peritoneal endometriosis.  There was no posterior cul-de-sac endometriosis.   DESCRIPTION OF OPERATION:  The patient was taken to the operating room  and placed in supine position where she underwent general endotracheal  anesthesia.  The vagina was prepped.  Foley catheter was placed.  Abdomen was prepped and draped in usual sterile fashion.  A transverse  minilap incision was made and carried down sharply to rectus fascia,  which was scored in midline and extended laterally.  Fascia was taken  off the muscle superiorly and inferiorly without difficulty.  The  muscles were divided.  Peritoneal cavity was entered.  An Alexis self-  retaining wound retractor was placed.  The upper abdomen was packed  away.  The uterine cornu were grasped.  The left round ligament was  suture ligated and cut.  The utero-ovarian ligament on the left was  clamped, cut, and suture ligated.  The vesicouterine serosal  flap was  created on the left.  The right round ligament was suture ligated and  cut.  The utero-ovarian ligament was clamped, cut, and suture ligated.  The vesicouterine serosal flap was created on the right and the bladder  was taken off the lower uterine segment without difficulty.  The uterine  vessels were clamped, cut, and suture ligated bilaterally.  Serial  pedicles were taken through the cardinal ligament down the cervix with  pedicle being clamped, cut, and suture ligated.  Vagina was  crossclamped.  Specimen was removed.  Vaginal angle sutures were placed  and the vagina was closed with interrupted figure-of-eight sutures.  Pelvis was irrigated vigorously.  All pedicles were found to be  hemostatic.  Interceed was placed over the vaginal cuff to prevent  postoperative adhesions.  The Alexis self-retaining wound retractor and  the packs were removed.  The muscles and peritoneum were reapproximated  loosely.  The fascia was closed using 0 Vicryl running subcutaneous  tissues, made hemostatic and irrigated.  The skin was closed using skin  staples.  The patient tolerated the procedure well.  She experienced 100  mL of blood loss and was taken to the recovery room in good and stable  condition.  All counts were correct x3.      Lazaro Arms, M.D.  Electronically Signed     LHE/MEDQ  D:  06/30/2008  T:  06/30/2008  Job:  [id number]

## 2011-05-15 NOTE — Discharge Summary (Signed)
NAMEMATEYA, TORTI               ACCOUNT NO.:  [id number]   MEDICAL RECORD NO.:  [id number]          PATIENT TYPE:  INP   LOCATION:  A313                          FACILITY:  [facility]   PHYSICIAN:  Lazaro Arms, M.D.   DATE OF BIRTH:  [birth year]   DATE OF ADMISSION:  06/30/2008  DATE OF DISCHARGE:  07/03/2009LH                               DISCHARGE SUMMARY   DISCHARGE DIAGNOSES:  1. Status post total abdominal hysterectomy.  2. Unremarkable post hospital course.   PROCEDURE:  TAH.   Please refer to the admission physical as well as the op note for  details of admission to the hospital.   HOSPITAL COURSE:  The patient was admitted postop to tolerate clear  liquids and a regular diet.  She voided without symptoms and was  increasing ambulatory.  Her incision was clean, dry, and intact.  Her  abdominal exam was benign.  Normal postop.  She was passing flatus,  voiding without problems.  Hemoglobin and hematocrit were stable.  She  was 11 postop day #1, 10.7 postop day #2.  As a result she was  discharged to home on the morning of postop day #2 in good stable  condition to follow up next week to have her incision evaluated.  She  was discharged on Lortab 5/500 #30 1-2 every six hours as needed for  pain and also Chantix for smoking cessation.      Lazaro Arms, M.D.  Electronically Signed     LHE/MEDQ  D:  07/02/2008  T:  07/02/2008  Job:  [id number]

## 2011-05-18 NOTE — H&P (Signed)
NAMESMITH, MCNICHOLAS               ACCOUNT NO.:  [id number]   MEDICAL RECORD NO.:  [id number]          PATIENT TYPE:  INP   LOCATION:  9169                          FACILITY:  [facility]   PHYSICIAN:  Kathreen Cosier, M.D.DATE OF BIRTH:  [birth year]   DATE OF ADMISSION:  08/30/2006  DATE OF DISCHARGE:                                HISTORY & PHYSICAL   HISTORY OF PRESENT ILLNESS:  The patient is a 30 year old gravida 1, EDC  September 21, 2006, who came with contractions and with a blood pressure of  170/112, hemoglobin 13, platelets 190,000, uric acid 5, cervix 1 cm, 50%,  vertex -2.  Membranes were ruptured artificially; fluid was clear.  IUPC was  inserted.  She has a positive GBS and was started on ampicillin.  She was  started on low-dose Pitocin and also magnesium sulfate, 4 g loading, 2 g per  hour.  The patient received labetalol 4 times, 20 mg IV, because of  diastolic blood pressures between 112 and 118.  She progressed  satisfactorily to 4 cm and with adequate labor, did not progress beyond 4 cm  over 4 hours and it was decided she would be delivered by C-section because  of failure to progress in labor.   PHYSICAL EXAMINATION:  GENERAL:  Physical exam revealed a well-developed  female in labor.  HEENT:  Negative.  LUNGS:  Clear.  HEART:  Regular rhythm.  No murmurs.  No gallops.  BREASTS:  No masses.  ABDOMEN:  Thirty-six-week size.  EXTREMITIES:  Edema 1+.           ______________________________  Kathreen Cosier, M.D.     BAM/MEDQ  D:  08/31/2006  T:  08/31/2006  Job:  [id number]

## 2011-05-18 NOTE — Discharge Summary (Signed)
[name], [name]               ACCOUNT NO.:  [id number]   MEDICAL RECORD NO.:  [id number]          PATIENT TYPE:  INP   LOCATION:  9124                          FACILITY:  [facility]   PHYSICIAN:  Kathreen Cosier, M.D.DATE OF BIRTH:  November 13, 1981   DATE OF ADMISSION:  08/30/2006  DATE OF DISCHARGE:  09/05/2006                                 DISCHARGE SUMMARY   The patient is a 30 year old primigravida, [facility] September 21, 2006, admitted  with a blood pressure of 170/112, hemoglobin 13, platelets 190, uric acid 5.  Cervix 1 cm, 50% vertex, -2. Membranes were ruptured artificially. Fluid was  clear. IUPC was inserted. She had a positive GBS. She was started on  penicillin and low-dose Pitocin. She was also on magnesium sulfate 4 g  loading, 2 g per hour, and received labetalol 20 mg IV on admission.  Estimated fetal weight was 5 pounds 5 ounces, and the patient had to receive  3 doses of labetalol because of her blood pressures. The patient was  developed by cesarean section for failure to progress. She had female,  Apgars 8 and 8, from the OP position, weight 5 pounds 14 ounces.  Postoperatively, she was continued on magnesium sulfate, and she was also  started on labetalol postoperative day #2 at 200 mg p.o. b.i.d.,  hydrochlorothiazide 50 mg p.o. daily, and by day #3, she had been spiking a  temperature, and her diastolics were between 94 and 104. She had 2+ edema,  hemoglobin of 3.8. Chest x-ray, EKG, CMET normal. The patient was discharged  day #5 on hydrochlorothiazide 50 mg p.o. daily, labetalol 200 mg p.o.  b.i.d., Norvasc 10 mg daily, Tylox for pain, ferrous sulfate for anemia to  see me in one week.   DISCHARGE DIAGNOSES:  Primary low transverse cesarean section for failure to  progress at 36 weeks because of severe preeclampsia.           ______________________________  Kathreen Cosier, M.D.     BAM/MEDQ  D:  10/02/2006  T:  10/03/2006  Job:  [id number]

## 2011-05-18 NOTE — Op Note (Signed)
NAMESHAQUELA, WEICHERT               ACCOUNT NO.:  [id number]   MEDICAL RECORD NO.:  [id number]          PATIENT TYPE:  INP   LOCATION:  9124                          FACILITY:  [facility]   PHYSICIAN:  Kathreen Cosier, M.D.DATE OF BIRTH:  [birth year]   DATE OF PROCEDURE:  08/31/2006  DATE OF DISCHARGE:                                 OPERATIVE REPORT   PREOPERATIVE DIAGNOSES:  1. Failure to progress in labor.  2. Preeclampsia.   POSTOPERATIVE DIAGNOSES:  1. Failure to progress in labor.  2. Preeclampsia.   OPERATION PERFORMED:   SURGEON:  Kathreen Cosier, M.D.   ANESTHESIA:  Epidural.   DESCRIPTION OF THE OPERATION:  The patient was placed on the operating table  in the supine position.  The abdomen was prepped and draped.  The bladder  was emptied with a Foley catheter.  A transverse suprapubic incision was  made and carried down to the rectus fascia.  The fascia was incised the  length of the incision.  The rectus muscles were retracted laterally.  The  peritoneum was incised.  A transverse incision was made in the visceral  peritoneum above the bladder.  The bladder was mobilized.  A transverse  lower uterine incision was made.   The patient infant was delivered from the occiput posterior position, female  with Apgars 8 and 8, and weight 5 pounds 14 ounces. The team was in  attendance.   The placenta was anterior and removed manually, and was sent to pathology.   The uterine cavity was cleaned with a dry lap.  The uterine incision was  closed in one layer with  __________  suture of #1 chromic.  Hemostasis was  satisfactory.  Bladder flap was closed with retention sutures of 2-0  chromic.  The uterus was intact.  The tubes and ovaries were normal.  The  abdomen was closed in layers; peritoneum with 2-0 chromic, fascia was  reapproximated with 2-0 Dexon and the skin was closed with a subcuticular  stitch of 4-0 Monocryl.   ESTIMATED BLOOD LOSS:  The blood loss was 800  mL.           ______________________________  Kathreen Cosier, M.D.    BAM/MEDQ  D:  08/31/2006  T:  09/02/2006  Job:  [id number]

## 2011-05-18 NOTE — Discharge Summary (Signed)
[name], [name]               ACCOUNT NO.:  [id number]   MEDICAL RECORD NO.:  [id number]          PATIENT TYPE:  INP   LOCATION:  1307                         [facility]ACILITY[facility]   PHYSICIAN:  Charlestine Massed, MDDATE OF BIRTH:  [birth year]   DATE OF ADMISSION:  02/11/2009  DATE OF DISCHARGE:  02/15/2009                               DISCHARGE SUMMARY   ADDENDUM:  Patient was discharged on February 15, 2009.  There were no  issues within the past 24 hours after the previous discharge summary.   DISCHARGE MEDICATIONS:  1. Doxycycline 100 mg p.o. b.i.d. for 7 days.  2. Ciprodex Otic drops 4 drops each ear b.i.d. for 3 weeks.  3. Nicotine patch 20 mg per day for 5 days and then 14 mg per day for      1 week and then 7 mg per day for 1 week.   Contact precautions to be observed by patient at home and outside.   Proper body hygiene advised.   Patient was advised to see Dr. Pollyann Kennedy, ENT physician, as soon as she is  discharged.  I spoke with Dr. Lucky Rathke office and they were ready to see  her at 1:30 p.m. that day so patient was discharged to see ENT and  follow up with primary care doctor.  Instructions for primary care were  given.  Patient was advised to follow up with Dr. Della Goo who  is her primary care doctor.      Charlestine Massed, MD  Electronically Signed     UT/MEDQ  D:  03/12/2009  T:  03/12/2009  Job:  [id number]   cc:   Della Goo, M.D.  Fax: [phone]   Jefry H. Pollyann Kennedy, MD  Fax: [phone]

## 2011-06-06 ENCOUNTER — Encounter: Payer: Self-pay | Admitting: Family Medicine

## 2011-06-06 ENCOUNTER — Ambulatory Visit (INDEPENDENT_AMBULATORY_CARE_PROVIDER_SITE_OTHER): Payer: Self-pay | Admitting: Family Medicine

## 2011-06-06 VITALS — BP 132/94 | HR 75 | Temp 98.9°F | Ht 61.0 in | Wt 145.0 lb

## 2011-06-06 DIAGNOSIS — S93402A Sprain of unspecified ligament of left ankle, initial encounter: Secondary | ICD-10-CM

## 2011-06-06 DIAGNOSIS — S93409A Sprain of unspecified ligament of unspecified ankle, initial encounter: Secondary | ICD-10-CM

## 2011-06-06 DIAGNOSIS — T7491XA Unspecified adult maltreatment, confirmed, initial encounter: Secondary | ICD-10-CM

## 2011-06-06 DIAGNOSIS — I10 Essential (primary) hypertension: Secondary | ICD-10-CM

## 2011-06-06 DIAGNOSIS — IMO0002 Reserved for concepts with insufficient information to code with codable children: Secondary | ICD-10-CM

## 2011-06-06 DIAGNOSIS — M549 Dorsalgia, unspecified: Secondary | ICD-10-CM

## 2011-06-06 MED ORDER — ACETAMINOPHEN ER 650 MG PO TBCR: 650.0000 mg | EXTENDED_RELEASE_TABLET | Freq: Two times a day (BID) | ORAL | Status: AC

## 2011-06-06 MED ORDER — TRAMADOL HCL 50 MG PO TABS: 50.0000 mg | ORAL_TABLET | Freq: Three times a day (TID) | ORAL | Status: DC | PRN

## 2011-06-06 NOTE — Patient Instructions (Signed)
Take tylenol 650mg  by mouth 2 x day- for pain control Then take tramadol up to 3 x per day as needed for breakthrough pain not controlled by tylenol.  Make a f/up appointment for full physical.

## 2011-06-06 NOTE — Assessment & Plan Note (Signed)
Chronic back pain- prescribed tylenol bid for controller and tramadol for breakthrough

## 2011-06-06 NOTE — Assessment & Plan Note (Signed)
Pt has not been taking bp medication.  sbp 130 today.  Will recheck at full physical exam.  If bp elevated will consider restart of bp medication.
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
## 2011-06-25 ENCOUNTER — Ambulatory Visit (INDEPENDENT_AMBULATORY_CARE_PROVIDER_SITE_OTHER): Payer: Self-pay | Admitting: Family Medicine

## 2011-06-25 ENCOUNTER — Encounter: Payer: Self-pay | Admitting: Family Medicine

## 2011-06-25 VITALS — BP 126/86 | HR 87 | Temp 99.0°F | Ht 61.0 in | Wt 142.0 lb

## 2011-06-25 DIAGNOSIS — IMO0002 Reserved for concepts with insufficient information to code with codable children: Secondary | ICD-10-CM | POA: Insufficient documentation

## 2011-06-25 DIAGNOSIS — Z202 Contact with and (suspected) exposure to infections with a predominantly sexual mode of transmission: Secondary | ICD-10-CM

## 2011-06-25 DIAGNOSIS — F17200 Nicotine dependence, unspecified, uncomplicated: Secondary | ICD-10-CM

## 2011-06-25 DIAGNOSIS — M549 Dorsalgia, unspecified: Secondary | ICD-10-CM

## 2011-06-25 DIAGNOSIS — I10 Essential (primary) hypertension: Secondary | ICD-10-CM

## 2011-06-25 DIAGNOSIS — N760 Acute vaginitis: Secondary | ICD-10-CM

## 2011-06-25 DIAGNOSIS — Z124 Encounter for screening for malignant neoplasm of cervix: Secondary | ICD-10-CM

## 2011-06-25 DIAGNOSIS — C9591 Leukemia, unspecified, in remission: Secondary | ICD-10-CM | POA: Insufficient documentation

## 2011-06-25 DIAGNOSIS — Z90710 Acquired absence of both cervix and uterus: Secondary | ICD-10-CM

## 2011-06-25 LAB — BASIC METABOLIC PANEL
Calcium: 9 mg/dL (ref 8.4–10.5)
Sodium: 139 mEq/L (ref 135–145)

## 2011-06-25 LAB — CBC
MCH: 27.8 pg (ref 26.0–34.0)
MCHC: 32.2 g/dL (ref 30.0–36.0)
Platelets: 187 10*3/uL (ref 150–400)

## 2011-06-25 NOTE — Assessment & Plan Note (Signed)
Encouraged to stop smoking

## 2011-06-25 NOTE — Progress Notes (Signed)
  Subjective:    Patient ID: [name], female    DOB: Jan 07, 1981, 30 y.o.   MRN: [id number]  HPI 1)BP- Not currently on bp medication.  bp 126/86.  2)back pain- "about the same"- improves with muscle relaxers.  Tylenol- wasn't able to afford at pharmacy.  Taking tramadol 1 x day.  No loss of bowel or bladder control.  No fever.  3)pain with intercourse - has "always had" x 13 years.  Sometimes hurts, sometimes no pain.  Tried lubrication and seemed to help some.  Uses cetaphil soap for bathing- concerned that this may be causing dryness.   SH: pt states she has been having sex with husband who has been unfaithful in past. Would like to be checked for std's  PMH: updated pmh tab- h/o childhood leukemia.   Review of Systems As per above. No fever. No n/v/d.  No vaginal discharge. No easy bruising.     Objective:   Physical Exam  Constitutional: She is oriented to person, place, and time. She appears well-developed and well-nourished.  Cardiovascular: Normal rate, regular rhythm and normal heart sounds.   No murmur heard. Pulmonary/Chest: Effort normal and breath sounds normal. She has no wheezes.  Genitourinary:       External GU exam normal.  Internal exam showed normal vaginal wall.  Unable to visualize cervix.  On bimanual exam felt end to cervix with line of thickening through middle (possible suture line s/p hysterectomy?)  Musculoskeletal: Normal range of motion. She exhibits no edema.       + tenderness to palp in lower back bilateral.  Neurological: She is alert and oriented to person, place, and time.  Skin: No rash noted.  Psychiatric: She has a normal mood and affect. Her behavior is normal. Judgment and thought content normal.          Assessment & Plan:

## 2011-06-25 NOTE — Assessment & Plan Note (Signed)
BP wnl today.  Do not consider this an active issue.

## 2011-06-25 NOTE — Assessment & Plan Note (Signed)
Will order CBC at this time.

## 2011-06-25 NOTE — Assessment & Plan Note (Signed)
Pt requests to be checked for STD's.  Appropriate labs ordered.  Pt to return in 6-8 weeks for HIV recheck.

## 2011-06-25 NOTE — Assessment & Plan Note (Signed)
Reinforced treatment plan of tylenol and tramadol for chronic back pain. Discussed with pt that  I will not prescribe flexeril for chronic back pain issues at this time.  Pt states understanding.

## 2011-06-25 NOTE — Patient Instructions (Signed)
For back pain: Take tylenol 650mg  po bid.   Take tramadol 50mg  po 3 x day.  For pain with sex: Use lubrication- [place] silky-- Most likely your pain is coming from surgical scar from hysterectomy.  If no improvement, I can send you for eval to [facility].

## 2011-06-26 ENCOUNTER — Encounter: Payer: Self-pay | Admitting: Family Medicine

## 2011-06-26 DIAGNOSIS — Z90710 Acquired absence of both cervix and uterus: Secondary | ICD-10-CM

## 2011-06-26 HISTORY — DX: Acquired absence of both cervix and uterus: Z90.710

## 2011-06-26 LAB — GC/CHLAMYDIA PROBE AMP, URINE: Chlamydia, Swab/Urine, PCR: NEGATIVE

## 2011-06-26 LAB — RPR

## 2011-06-26 NOTE — Assessment & Plan Note (Signed)
No longer indicated.  S/P total abdominal hysterectomy in 2009.

## 2011-07-11 ENCOUNTER — Ambulatory Visit: Payer: Self-pay | Admitting: Family Medicine

## 2011-07-13 ENCOUNTER — Ambulatory Visit (INDEPENDENT_AMBULATORY_CARE_PROVIDER_SITE_OTHER): Payer: Self-pay | Admitting: Family Medicine

## 2011-07-13 VITALS — BP 137/98 | HR 84 | Temp 98.2°F | Wt 142.6 lb

## 2011-07-13 DIAGNOSIS — L732 Hidradenitis suppurativa: Secondary | ICD-10-CM | POA: Insufficient documentation

## 2011-07-13 DIAGNOSIS — L210 Seborrhea capitis: Secondary | ICD-10-CM

## 2011-07-13 DIAGNOSIS — L218 Other seborrheic dermatitis: Secondary | ICD-10-CM

## 2011-07-13 HISTORY — DX: Hidradenitis suppurativa: L73.2

## 2011-07-13 MED ORDER — DOXYCYCLINE HYCLATE 100 MG PO TABS: 100.0000 mg | ORAL_TABLET | Freq: Two times a day (BID) | ORAL | Status: AC

## 2011-07-13 NOTE — Progress Notes (Signed)
  Subjective:    Patient ID: [name], female    DOB: [birth year], 30 y.o.   MRN: [id number]  HPI  Boils- long hx of them, now have recurred x 2 weeks.  Has had surgeries in past to drain, remove affected tissue.  No fevers or sweats.  No drainage.    Itching scalp-  Mom with tinea, concerned she has the same.  Has not tried anything for it.  Some hair loss, diffuse.  Review of Systems Denies CP, SOB, HA, N/V/D, fever     Objective:   Physical Exam    Vital signs reviewed General appearance - alert, well appearing, and in no distress and oriented to person, place, and time--flat affect Heart - normal rate, regular rhythm, normal S1, S2, no murmurs, rubs, clicks or gallops Chest - clear to auscultation, no wheezes, rales or rhonchi, symmetric air entry, no tachypnea, retractions or cyanosis SKin- dime sized non flucuant lesion left under arm.  2 small (1cm) lesions under right arm, one red lesion with some crust under left breast.   Hair-  Diffuse areas of redness and scale in scalp. No localized areas of hair loss.  Hair does not appear thin     Assessment & Plan:

## 2011-07-13 NOTE — Assessment & Plan Note (Signed)
Pt concerned for tinea but on exam looks more like dandruff.  Asked her to try dandruff shampoo, rtc if no better.

## 2011-07-13 NOTE — Patient Instructions (Signed)
For your hair: Try the dandruff shampoo, leave it on about 5 minutes while you are in the shower If the itching isn't better in 2 weeks, call us back and we will take another look  For the boils: Take doxycycline for 10 days twice a day If any of them look increasingly red and streaky, call for an appt

## 2011-07-13 NOTE — Assessment & Plan Note (Signed)
Dime sized boil under left arm, small boils under right arm and one small boil on left breast.  Treat with doxy x 10 days.  Consider suppression with doxy if PCP agrees.

## 2011-07-19 ENCOUNTER — Ambulatory Visit (INDEPENDENT_AMBULATORY_CARE_PROVIDER_SITE_OTHER): Payer: Self-pay | Admitting: Family Medicine

## 2011-07-19 ENCOUNTER — Encounter: Payer: Self-pay | Admitting: Family Medicine

## 2011-07-19 DIAGNOSIS — F341 Dysthymic disorder: Secondary | ICD-10-CM

## 2011-07-19 DIAGNOSIS — Z638 Other specified problems related to primary support group: Secondary | ICD-10-CM

## 2011-07-19 DIAGNOSIS — Z6379 Other stressful life events affecting family and household: Secondary | ICD-10-CM

## 2011-07-19 MED ORDER — CITALOPRAM HYDROBROMIDE 20 MG PO TABS: 20.0000 mg | ORAL_TABLET | Freq: Every day | ORAL | Status: DC
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
## 2011-07-19 NOTE — Patient Instructions (Addendum)
For the abscesses: Finish course of doxy-   follow up in 1 month for recheck of areas ADD: Get an appointment before school starts to reevaluate Stressors: Take celexa as directed.  Consider meeting with Dr. Pascal Lux.

## 2011-07-22 DIAGNOSIS — Z6379 Other stressful life events affecting family and household: Secondary | ICD-10-CM | POA: Insufficient documentation

## 2011-07-22 NOTE — Assessment & Plan Note (Signed)
Pt started on celexa. Pt given Dr. Carola Rhine card - pt is to consider therapy.  Pt is to return in 2 weeks for follow up visit.

## 2011-07-22 NOTE — Assessment & Plan Note (Signed)
encouraged pt to seek out therapy in order to develop coping mechanisms- gave pt Dr. Carola Rhine card.

## 2011-08-01 ENCOUNTER — Telehealth: Payer: Self-pay | Admitting: Family Medicine

## 2011-08-01 NOTE — Telephone Encounter (Signed)
Message left to return call.

## 2011-08-01 NOTE — Telephone Encounter (Signed)
States she took first celexa last Thursday  07/26/2011. The next day she felt chest pain and felt her heart racing. She called pharmacist and was told to stop medication. She has continued having some chest pain and she is not sure but feels it may be anxiety. She has a follow up appointment with MD 08/15/2011. She is concerned about having a heart attack. Advised her to go to ED if she is having chest pain. Offered work in appointment tomorrow but she declines. States she will probably go to ED

## 2011-08-01 NOTE — Telephone Encounter (Signed)
Patient called back and I attempted to return her call and again  Left message on voicemail.

## 2011-08-01 NOTE — Telephone Encounter (Signed)
Pt stopped taking Celexa since it gives her chest pains and pharm told her to stop taking.  Would like to talk to nurse

## 2011-08-06 ENCOUNTER — Inpatient Hospital Stay (INDEPENDENT_AMBULATORY_CARE_PROVIDER_SITE_OTHER)
Admission: RE | Admit: 2011-08-06 | Discharge: 2011-08-06 | Disposition: A | Discharge status: Home / Self Care | Payer: Self-pay | Source: Ambulatory Visit | Attending: Emergency Medicine | Admitting: Emergency Medicine

## 2011-08-06 DIAGNOSIS — T6391XA Toxic effect of contact with unspecified venomous animal, accidental (unintentional), initial encounter: Secondary | ICD-10-CM

## 2011-08-13 ENCOUNTER — Telehealth: Payer: Self-pay | Admitting: Family Medicine

## 2011-08-13 NOTE — Telephone Encounter (Signed)
Ms. [name] is needing to know what month and year she started taking Lexapro.  I did not see that medication listed for her.  She would like the nurse to call her back.

## 2011-08-13 NOTE — Telephone Encounter (Signed)
Called patient and told her I do not see where she has ever taking lexapro, she says it was several years ago. Told patient to call her pharmacy and or insurance company to see if they would have the information.Cherilynn Schomburg, [name]

## 2011-08-15 ENCOUNTER — Ambulatory Visit (INDEPENDENT_AMBULATORY_CARE_PROVIDER_SITE_OTHER): Payer: Self-pay | Admitting: Family Medicine

## 2011-08-15 ENCOUNTER — Other Ambulatory Visit: Payer: Self-pay

## 2011-08-15 ENCOUNTER — Encounter: Payer: Self-pay | Admitting: Family Medicine

## 2011-08-15 ENCOUNTER — Ambulatory Visit (HOSPITAL_COMMUNITY)
Admission: RE | Admit: 2011-08-15 | Discharge: 2011-08-15 | Disposition: A | Discharge status: Home / Self Care | Payer: Self-pay | Source: Ambulatory Visit | Attending: Family Medicine | Admitting: Family Medicine

## 2011-08-15 DIAGNOSIS — R079 Chest pain, unspecified: Secondary | ICD-10-CM | POA: Insufficient documentation

## 2011-08-15 DIAGNOSIS — R0789 Other chest pain: Secondary | ICD-10-CM

## 2011-08-15 DIAGNOSIS — F341 Dysthymic disorder: Secondary | ICD-10-CM

## 2011-08-15 MED ORDER — FLUOXETINE HCL 20 MG PO CAPS: 20.0000 mg | ORAL_CAPSULE | Freq: Every day | ORAL | Status: DC

## 2011-08-15 NOTE — Patient Instructions (Signed)
Take prozac as directed- must take it daily to work.  Please return in 2 weeks so we can follow up on depression and chest pain.  Or sooner if needed

## 2011-08-22 DIAGNOSIS — R0789 Other chest pain: Secondary | ICD-10-CM | POA: Insufficient documentation

## 2011-08-22 NOTE — Assessment & Plan Note (Signed)
Pt did not like the way she felt on celexa.  Requested medication change.  Started on prozac.  Pt given Dr. Carola Rhine card - pt is to consider therapy.  PHQ-9: 08/15/11---21

## 2011-08-22 NOTE — Progress Notes (Signed)
  Subjective:    Patient ID: [name], female    DOB: 29-Aug-1981, 29 y.o.   MRN: [id number]  HPI Chest pain: Comes and goes during past month, located in left chest, is a pressure, sometimes sharp in nature.  Has occurred 3-4 x this week.  Sometimes lasts a few minutes sometimes lasts up to an hour.  No sob with chest pain.  Not worsened with walking or relived with rest.  Usually has h/a at the same time with chest pain. CP seems to occur when she has increased stress during he day.  States she is currently having a lot of stress, problems at work. No chest pain at today's appt.  No diaphoresis. No n/v with cp.    Depression: PHQ-9 score of 21.  Little interest in hobbies, feels depressed, unable to sleep, decreased energy, decreased appetite, problems with focus.  + feelings of guilt.  Finds it very difficult to function in the setting of these symptoms.        Review of Systems    as per above Objective:   Physical Exam  Constitutional: She is oriented to person, place, and time. She appears well-developed and well-nourished.  HENT:  Head: Normocephalic and atraumatic.  Cardiovascular: Normal rate, regular rhythm and normal heart sounds.   No murmur heard. Pulmonary/Chest: Effort normal and breath sounds normal. No respiratory distress. She has no wheezes. She has no rales.  Musculoskeletal: She exhibits no edema.  Neurological: She is alert and oriented to person, place, and time.  Skin: No rash noted.  Psychiatric: She has a normal mood and affect. Her behavior is normal.       PHQ9 score of 21.  No SI or HI.          Assessment & Plan:

## 2011-08-22 NOTE — Assessment & Plan Note (Signed)
Most likely part of stress reaction, EKG -NSR with no st segment changes.  Gave pt red flags for return.  Pt does have mother with history of heart disease in 40's.  Pt to return in 1-2 weeks for follow up on this issue.

## 2011-08-24 ENCOUNTER — Encounter: Payer: Self-pay | Admitting: Family Medicine

## 2011-08-24 ENCOUNTER — Telehealth: Payer: Self-pay | Admitting: Family Medicine

## 2011-08-24 ENCOUNTER — Ambulatory Visit (INDEPENDENT_AMBULATORY_CARE_PROVIDER_SITE_OTHER): Payer: Self-pay | Admitting: Family Medicine

## 2011-08-24 VITALS — BP 119/81 | HR 99 | Temp 98.0°F | Wt 139.0 lb

## 2011-08-24 DIAGNOSIS — H6690 Otitis media, unspecified, unspecified ear: Secondary | ICD-10-CM

## 2011-08-24 MED ORDER — ACETIC ACID 2 % OT SOLN: 4.0000 [drp] | Freq: Every day | OTIC | Status: AC

## 2011-08-24 MED ORDER — NEOMYCIN-POLYMYXIN-HC 1 % OT SOLN: 3.0000 [drp] | Freq: Four times a day (QID) | OTIC | Status: DC

## 2011-08-24 NOTE — Telephone Encounter (Signed)
Will forward to [facility]

## 2011-08-24 NOTE — Telephone Encounter (Signed)
Ms. [name] was in earlier and needs a note for when she can go back to work due to having MRSA.  Please give her a call when it is ready.

## 2011-08-24 NOTE — Assessment & Plan Note (Signed)
Will start on drops, will have pt back in 1 month,  Consider suppression drops.  Consider ENT consult but no insurance

## 2011-08-24 NOTE — Patient Instructions (Addendum)
I am giving you two drops Antibiotic to use until you stop the discharge and the acetic acid to use daily to help you not getting this again.  See your regular doctor in 1 month to make sure everything is better.

## 2011-08-24 NOTE — Telephone Encounter (Signed)
Ms. [name] has called back and wanted to be sure that the MD knew that she needed the note in order to return to work tomorrow.  She did not mention that before.

## 2011-08-25 NOTE — Progress Notes (Signed)
  Subjective:    Patient ID: [name], female    DOB: 11-22-1981, 30 y.o.   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 Pt doe snot take any immunocompromised medicines, does have long nails.  Does have some pain with the ears as well. Pt denies any swimming.    Review of Systems Denies headache, visual changes, trouble swallowing,  No hx f having ear tubes in the past.     Objective:   Physical Exam Gen: NAD, pleasant Ears: right TM has mild rupture in the 4 oclock position with what appears to be dried blood and possible impetigo. Mild erythema of the TM, no active draining occuring. Left TM intact with no true discoloration only mild erythema of the external canal.     Assessment & Plan:

## 2011-08-27 ENCOUNTER — Emergency Department (HOSPITAL_COMMUNITY)
Admission: EM | Admit: 2011-08-27 | Discharge: 2011-08-27 | Disposition: A | Discharge status: Home / Self Care | Payer: Self-pay | Attending: Emergency Medicine | Admitting: Emergency Medicine

## 2011-08-27 DIAGNOSIS — R51 Headache: Secondary | ICD-10-CM | POA: Insufficient documentation

## 2011-08-27 DIAGNOSIS — R5381 Other malaise: Secondary | ICD-10-CM | POA: Insufficient documentation

## 2011-08-27 DIAGNOSIS — R5383 Other fatigue: Secondary | ICD-10-CM | POA: Insufficient documentation

## 2011-08-27 DIAGNOSIS — R110 Nausea: Secondary | ICD-10-CM | POA: Insufficient documentation

## 2011-08-27 DIAGNOSIS — J3489 Other specified disorders of nose and nasal sinuses: Secondary | ICD-10-CM | POA: Insufficient documentation

## 2011-08-27 DIAGNOSIS — H9210 Otorrhea, unspecified ear: Secondary | ICD-10-CM | POA: Insufficient documentation

## 2011-08-27 DIAGNOSIS — H9209 Otalgia, unspecified ear: Secondary | ICD-10-CM | POA: Insufficient documentation

## 2011-08-27 LAB — POCT I-STAT, CHEM 8
BUN: 8 mg/dL (ref 6–23)
Calcium, Ion: 1.23 mmol/L (ref 1.12–1.32)
TCO2: 28 mmol/L (ref 0–100)

## 2011-08-27 NOTE — Telephone Encounter (Signed)
If she needs note for work when she was seen that would be fine, but pt can go back to work now.

## 2011-08-27 NOTE — Telephone Encounter (Signed)
To MD. Leontae Bostock Dawn  

## 2011-08-27 NOTE — Telephone Encounter (Signed)
Unable to reach patient at any of her numbers.  If she calls back please inform her she can have a note for the 24 when she had a appt, but per MD she should miss no other work.  I will write and place up front if this is what she wants.

## 2011-08-31 ENCOUNTER — Encounter: Payer: Self-pay | Admitting: Family Medicine

## 2011-08-31 ENCOUNTER — Ambulatory Visit (INDEPENDENT_AMBULATORY_CARE_PROVIDER_SITE_OTHER): Payer: Self-pay | Admitting: Family Medicine

## 2011-08-31 DIAGNOSIS — H6690 Otitis media, unspecified, unspecified ear: Secondary | ICD-10-CM

## 2011-08-31 DIAGNOSIS — F17200 Nicotine dependence, unspecified, uncomplicated: Secondary | ICD-10-CM

## 2011-08-31 NOTE — Patient Instructions (Signed)
It was nice to meet you.  I am sorry you have been through so much with your ears.  Keep taking the antibiotic drops until the drainage stops.  The best way to help your immune system is to quit smoking.  Please be sure to follow up with your regular doctor in about one month to make sure everything is getting better.

## 2011-09-03 LAB — MRSA CULTURE: Organism ID, Bacteria: NO GROWTH

## 2011-09-05 ENCOUNTER — Telehealth: Payer: Self-pay | Admitting: Family Medicine

## 2011-09-05 NOTE — Telephone Encounter (Signed)
Please call patient back with lab results

## 2011-09-05 NOTE — Assessment & Plan Note (Signed)
Discussed smoking as bad for the immune system, pt unreceptive to advise.

## 2011-09-05 NOTE — Assessment & Plan Note (Signed)
Seems to be improving.  Will swab ears but low yield considering already on antibiotics.  Explained to pt that even if ear swabs were + for MRSA she has had it before, and people carry the bacteria, it would be impossible to prove she got it at work.  She insists she wants the swabs done anyway.

## 2011-09-05 NOTE — Telephone Encounter (Signed)
Ms. [name] is calling back because she has not heard back from anyone on her lab results.

## 2011-09-05 NOTE — Progress Notes (Signed)
  Subjective:    Patient ID: [name], female    DOB: [birth year], 30 y.o.   MRN: [id number]  HPI  Pt presents for follow up because her ears are still draining pus.  She says she saw Dr. Katrinka Blazing a few days ago and got antibiotic drops to put in her ears.  She says she was seen at urgent care and had her ears swabbed for MRSA because she felt she got the MRSA ear infections from work.  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  She has quit her job and is trying to get work-man's comp for getting MRSA from work.  She says that the urgent care swabbed her ears and it was negative, but she wants it done again because she does not trust them.  Prior to this incident at the nursing home, pt has had MRSA infections.  She says she needs to know if she has it because she has children.    Patient wants to know why her immune system is so bad and she get sick so much.  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  Pt smokes 1ppd.   Review of Systems Negative except HPI.     Objective:   Physical Exam BP 113/80  Pulse 85  Temp(Src) 98 F (36.7 C) (Oral)  Wt 142 lb (64.411 kg) General appearance: alert, cooperative and no distress Eyes: conjunctivae/corneas clear. PERRL, EOM's intact. Fundi benign. Ears: TM's without erythema, but do have clear drainage from inside.  No canal erythema.  Nose: Nares normal. Septum midline. Mucosa normal. No drainage or sinus tenderness. Throat: lips, mucosa, and tongue normal; teeth and gums normal Neck: no adenopathy, supple, symmetrical, trachea midline and thyroid not enlarged, symmetric, no tenderness/mass/nodules Lungs: clear to auscultation bilaterally Heart: regular rate and rhythm, S1, S2 normal, no murmur, click, rub or gallop       Assessment & Plan:  Ear infection Seems to be improving.  Will swab  ears but low yield considering already on antibiotics.  Explained to pt that even if ear swabs were + for MRSA she has had it before, and people carry the bacteria, it would be impossible to prove she got it at work.  She insists she wants the swabs done anyway.   TOBACCO USER Discussed smoking as bad for the immune system, pt unreceptive to advise.     ? History of Leukemia as child- no records of this in our system.  Did advise patient to talk with parents and find out more about this.

## 2011-09-06 NOTE — Telephone Encounter (Signed)
Pt informed. .[name]  

## 2011-09-06 NOTE — Telephone Encounter (Signed)
Pt is calling again to see about her results- she is concerned about it being MRSA

## 2011-09-06 NOTE — Telephone Encounter (Signed)
Called pt and left message to call back. Please tell pt: No MERSA on culture. Thanks. Lorenda Hatchet, Renato Battles

## 2011-09-17 ENCOUNTER — Encounter: Payer: Self-pay | Admitting: Family Medicine

## 2011-09-19 ENCOUNTER — Ambulatory Visit: Payer: Self-pay | Admitting: Family Medicine

## 2011-09-24 LAB — POCT URINALYSIS DIP (DEVICE)
Bilirubin Urine: NEGATIVE
Nitrite: NEGATIVE
Specific Gravity, Urine: 1.01
pH: 6.5

## 2011-09-27 LAB — URINALYSIS, ROUTINE W REFLEX MICROSCOPIC
Bilirubin Urine: NEGATIVE
Glucose, UA: NEGATIVE
Glucose, UA: NEGATIVE
Hgb urine dipstick: NEGATIVE
Hgb urine dipstick: NEGATIVE
Hgb urine dipstick: NEGATIVE
Ketones, ur: NEGATIVE
Protein, ur: NEGATIVE
Protein, ur: NEGATIVE
Specific Gravity, Urine: 1.01
Specific Gravity, Urine: 1.01
Urobilinogen, UA: 0.2
pH: 5.5

## 2011-09-27 LAB — DIFFERENTIAL
Basophils Absolute: 0
Basophils Absolute: 0
Basophils Relative: 1
Eosinophils Relative: 4
Eosinophils Relative: 4
Lymphocytes Relative: 27
Lymphocytes Relative: 22
Lymphocytes Relative: 22
Lymphs Abs: 1.7
Lymphs Abs: 1.6
Monocytes Absolute: 0.3
Monocytes Relative: 5
Monocytes Relative: 8
Neutro Abs: 3.6
Neutro Abs: 4.7
Neutro Abs: 4.8
Neutrophils Relative %: 65

## 2011-09-27 LAB — COMPREHENSIVE METABOLIC PANEL
ALT: 8
AST: 14
AST: 14
Albumin: 3.7
Albumin: 3.4 — ABNORMAL LOW
Alkaline Phosphatase: 62
BUN: 8
Chloride: 109
Chloride: 103
Creatinine, Ser: 0.6
GFR calc Af Amer: >60
GFR calc Af Amer: >60
Potassium: 3.9
Total Bilirubin: 0.4
Total Bilirubin: 0.8
Total Protein: 6.7

## 2011-09-27 LAB — CBC
HCT: 33 — ABNORMAL LOW
HCT: 33.9 — ABNORMAL LOW
Hemoglobin: 12.1
MCV: 74.3 — ABNORMAL LOW
Platelets: 225
Platelets: 196
Platelets: 222
Platelets: 246
RBC: 4.8
RBC: 4.55
RDW: 15.9 — ABNORMAL HIGH
RDW: 15.4
WBC: 6.3
WBC: 7.4
WBC: 7.3
WBC: 8.7

## 2011-09-27 LAB — WET PREP, GENITAL
Trich, Wet Prep: NONE SEEN
Yeast Wet Prep HPF POC: NONE SEEN

## 2011-09-27 LAB — RPR: RPR Ser Ql: NONREACTIVE

## 2011-09-27 LAB — BASIC METABOLIC PANEL
CO2: 27
Calcium: 8.9
GFR calc Af Amer: >60
GFR calc non Af Amer: >60
Sodium: 138

## 2011-09-27 LAB — GC/CHLAMYDIA PROBE AMP, GENITAL
Chlamydia, DNA Probe: NEGATIVE
GC Probe Amp, Genital: NEGATIVE

## 2011-09-27 LAB — TYPE AND SCREEN
ABO/RH(D): A POS
Antibody Screen: NEGATIVE

## 2011-09-28 LAB — URINALYSIS, ROUTINE W REFLEX MICROSCOPIC
Glucose, UA: NEGATIVE
Ketones, ur: NEGATIVE
Protein, ur: NEGATIVE

## 2011-09-28 LAB — URINE MICROSCOPIC-ADD ON

## 2011-10-01 LAB — POCT URINALYSIS DIP (DEVICE)
Bilirubin Urine: NEGATIVE
Ketones, ur: NEGATIVE
Nitrite: NEGATIVE
Operator id: 235561
Protein, ur: NEGATIVE

## 2011-10-09 ENCOUNTER — Encounter: Payer: Self-pay | Admitting: Family Medicine

## 2011-10-09 ENCOUNTER — Ambulatory Visit (INDEPENDENT_AMBULATORY_CARE_PROVIDER_SITE_OTHER): Payer: Self-pay | Admitting: Family Medicine

## 2011-10-09 DIAGNOSIS — I10 Essential (primary) hypertension: Secondary | ICD-10-CM

## 2011-10-09 DIAGNOSIS — K0889 Other specified disorders of teeth and supporting structures: Secondary | ICD-10-CM

## 2011-10-09 DIAGNOSIS — K089 Disorder of teeth and supporting structures, unspecified: Secondary | ICD-10-CM

## 2011-10-09 DIAGNOSIS — T7491XA Unspecified adult maltreatment, confirmed, initial encounter: Secondary | ICD-10-CM

## 2011-10-09 DIAGNOSIS — F909 Attention-deficit hyperactivity disorder, unspecified type: Secondary | ICD-10-CM

## 2011-10-09 DIAGNOSIS — F341 Dysthymic disorder: Secondary | ICD-10-CM

## 2011-10-09 DIAGNOSIS — IMO0002 Reserved for concepts with insufficient information to code with codable children: Secondary | ICD-10-CM

## 2011-10-09 NOTE — Patient Instructions (Signed)
[facility] [phone] Crisis line- [phone]  Call today to make an appointment with a psychiatrist.   Return in 1 month for follow up.  I will send a referral to the dental clinic- I do not know how long the wait list is.

## 2011-10-09 NOTE — Progress Notes (Signed)
  Subjective:    Patient ID: [name], female    DOB: 1981-12-14, 30 y.o.   MRN: [id number]  HPI tooth pain: Had filling in right lower back to in February, fill out 30 days later. Now having pain. Cannot go back to Dr. Uvaldo Rising since she had had a pocket and no longer has any money for this. No pus. No drainage. No pain along gumline. Pain with touching tooth.  Question of hypertension: Blood pressure trends in 140s per patient. Blood pressure 126/88. Not taking any medication.  Depression/anxiety: Started on Prozac. Did not return for followup. After taking 4 weeks of 20 mg of Prozac felt like it was not helping. So she stopped the medication. Did not return for titration or followup. Patient continues to have troubles with sleep. Decreased energy at times. Excess energy at other times it keeps her from sleeping. Poor appetite. Feels bad about herself. Problems concentrating. Feels that she's moving faster than others. Sometimes has flight of ideas. Sometimes have lots of anger. Increase talkativeness. Denies auditory or visual hallucinations. Denies any grandiose ideation. Has lots of stress in life between husband who she is separated from, other family issues, son with ADHD and possible autism. Has not seen a therapist. PHQ-9 of20. No SI or HI.  Review of Systems As per above. No fever    Objective:   Physical Exam  Constitutional: She is oriented to person, place, and time. She appears well-developed and well-nourished.  HENT:  Head: Normocephalic and atraumatic.       Dental exam: Multiple metal fillings present, no gum edema, fluctuance, no areas of tenderness along gumline, + tenderness with pressure on right lower back tooth. No drainage, no pus, no redness, no edema- of gums  Cardiovascular: Normal rate, regular rhythm and normal heart sounds.   No murmur heard. Pulmonary/Chest: Effort normal. No respiratory distress.  Abdominal: Soft. She exhibits no distension.    Musculoskeletal: Normal range of motion. She exhibits no edema.  Neurological: She is alert and oriented to person, place, and time.  Skin: No rash noted.  Psychiatric: She has a normal mood and affect. Her behavior is normal.       PHQ-9 score of 20.           Assessment & Plan:

## 2011-10-10 LAB — URINALYSIS, ROUTINE W REFLEX MICROSCOPIC
Ketones, ur: NEGATIVE
Nitrite: NEGATIVE
Specific Gravity, Urine: 1.008
Urobilinogen, UA: 1
pH: 7

## 2011-10-10 LAB — I-STAT 8, (EC8 V) (CONVERTED LAB)
BUN: 9
Bicarbonate: 18 — ABNORMAL LOW
Glucose, Bld: 83
Hemoglobin: 10.9 — ABNORMAL LOW
pCO2, Ven: 20.8 — ABNORMAL LOW

## 2011-10-10 LAB — PROTIME-INR: Prothrombin Time: 15.1

## 2011-10-10 LAB — URINE MICROSCOPIC-ADD ON

## 2011-10-10 LAB — CBC
HCT: 28.1 — ABNORMAL LOW
Hemoglobin: 8.6 — ABNORMAL LOW
MCHC: 30.7
Platelets: 235
RDW: 22.7 — ABNORMAL HIGH

## 2011-10-10 LAB — DIFFERENTIAL
Basophils Absolute: 0
Basophils Relative: 1
Eosinophils Absolute: 0.2
Eosinophils Relative: 4
Lymphocytes Relative: 32
Monocytes Absolute: 0.4

## 2011-10-10 LAB — WET PREP, GENITAL
Clue Cells Wet Prep HPF POC: NONE SEEN
WBC, Wet Prep HPF POC: NONE SEEN
Yeast Wet Prep HPF POC: NONE SEEN

## 2011-10-10 LAB — POCT I-STAT CREATININE: Creatinine, Ser: 0.7

## 2011-10-14 ENCOUNTER — Encounter: Payer: Self-pay | Admitting: Family Medicine

## 2011-10-14 DIAGNOSIS — K0889 Other specified disorders of teeth and supporting structures: Secondary | ICD-10-CM | POA: Insufficient documentation

## 2011-10-14 NOTE — Assessment & Plan Note (Signed)
bp wnl. No longer a problem. May have been medication induced.  Need to look back and see if 2/2 stimulants for adhd.

## 2011-10-14 NOTE — Assessment & Plan Note (Signed)
pt stopped Prozac after taking low dose x 4-6 weeks.  States that it didn't help, but she didn't come to f/up to allow for titration up of medication.  Also did not f/up with Dr. Pascal Lux or other therapist as directed.  Recommended that pt f/up with psychiatrist to rule out other mood disorders prior to attempt for other medical treatment. Gave pt a list of resources.

## 2011-10-14 NOTE — Assessment & Plan Note (Signed)
Will refer pt to adult dental services- may not see her since no antibiotics are narcotics needed.

## 2011-10-23 ENCOUNTER — Ambulatory Visit (INDEPENDENT_AMBULATORY_CARE_PROVIDER_SITE_OTHER): Payer: Self-pay | Admitting: Family Medicine

## 2011-10-23 VITALS — BP 144/92 | HR 100 | Ht 61.0 in | Wt 142.9 lb

## 2011-10-23 DIAGNOSIS — M62838 Other muscle spasm: Secondary | ICD-10-CM

## 2011-10-23 MED ORDER — KETOROLAC TROMETHAMINE 60 MG/2ML IJ SOLN
60.0000 mg | Freq: Once | INTRAMUSCULAR | Status: AC
  Administered 2011-10-23: 60 mg via INTRAMUSCULAR

## 2011-10-23 MED ORDER — CYCLOBENZAPRINE HCL 5 MG PO TABS: 5.0000 mg | ORAL_TABLET | Freq: Three times a day (TID) | ORAL | Status: DC | PRN

## 2011-10-23 NOTE — Patient Instructions (Signed)
Muscle Strain A muscle strain, or pulled muscle, occurs when a muscle is over-stretched. A small number of muscle fibers may also be torn. This is especially common in athletes. This happens when a sudden violent force placed on a muscle pushes it past its capacity. Usually, recovery from a pulled muscle takes 1 to 2 weeks. But complete healing will take 5 to 6 weeks. There are millions of muscle fibers. Following injury, your body will usually return to normal quickly. HOME CARE INSTRUCTIONS   While awake, apply ice to the sore muscle for 15 to 20 minutes each hour for the first 2 days. Put ice in a plastic bag and place a towel between the bag of ice and your skin.   Do not use the pulled muscle for several days. Do not use the muscle if you have pain.   You may wrap the injured area with an elastic bandage for comfort. Be careful not to bind it too tightly. This may interfere with blood circulation.   Only take over-the-counter or prescription medicines for pain, discomfort, or fever as directed by your caregiver. Do not use aspirin as this will increase bleeding (bruising) at injury site.   Warming up before exercise helps prevent muscle strains.  SEEK MEDICAL CARE IF:  There is increased pain or swelling in the affected area. MAKE SURE YOU:   Understand these instructions.   Will watch your condition.   Will get help right away if you are not doing well or get worse.  Document Released: 12/17/2005 Document Revised: 08/29/2011 Document Reviewed: 07/16/2007 [employer] Patient Information 2012 [employer]. 
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
## 2011-11-01 ENCOUNTER — Telehealth: Payer: Self-pay | Admitting: Family Medicine

## 2011-11-01 NOTE — Telephone Encounter (Signed)
Ms. [name] is having an outbreak of her hydradenitis and need refill on the doxycycline.  Please call [facility] on [address] and let pt know when sent

## 2011-11-01 NOTE — Telephone Encounter (Signed)
Fwd. To PCP for refill please. .Levon Boettcher  

## 2011-11-04 NOTE — Telephone Encounter (Signed)
Please let pt know that I would like to see her and examen her to determine the best medication/treatment for her.  Please have pt make an appointment.

## 2011-11-05 NOTE — Telephone Encounter (Signed)
Waiting for pt to call back. See message. Pt needs appt .[name]

## 2011-11-21 ENCOUNTER — Emergency Department (INDEPENDENT_AMBULATORY_CARE_PROVIDER_SITE_OTHER)
Admission: EM | Admit: 2011-11-21 | Discharge: 2011-11-21 | Disposition: A | Discharge status: Home / Self Care | Payer: Self-pay | Source: Home / Self Care | Attending: Emergency Medicine | Admitting: Emergency Medicine

## 2011-11-21 ENCOUNTER — Encounter (HOSPITAL_COMMUNITY): Payer: Self-pay | Admitting: *Deleted

## 2011-11-21 DIAGNOSIS — L089 Local infection of the skin and subcutaneous tissue, unspecified: Secondary | ICD-10-CM

## 2011-11-21 DIAGNOSIS — K0510 Chronic gingivitis, plaque induced: Secondary | ICD-10-CM

## 2011-11-21 MED ORDER — HYDROCODONE-ACETAMINOPHEN 5-325 MG PO TABS: 2.0000 | ORAL_TABLET | ORAL | Status: AC | PRN

## 2011-11-21 MED ORDER — PENICILLIN V POTASSIUM 500 MG PO TABS: 500.0000 mg | ORAL_TABLET | Freq: Four times a day (QID) | ORAL | Status: AC

## 2011-11-21 NOTE — ED Provider Notes (Addendum)
History     CSN: [id number] Arrival date & time: 11/21/2011  1:53 PM   First MD Initiated Contact with Patient 11/21/11 1359      Chief Complaint  Patient presents with  . Dental Pain    (Consider location/radiation/quality/duration/timing/severity/associated sxs/prior treatment) HPI Comments: The dentist removed my lower tooth, and its oozing this yellow stuff and throbbing in pain, he is not back until Wednesday"..  Patient is a 30 y.o. female presenting with tooth pain.  Dental PainThe primary symptoms include dental injury. Primary symptoms do not include oral bleeding or fever. The symptoms began 12 to 24 hours ago. The symptoms are worsening.  Additional symptoms include: gum swelling and gum tenderness. Additional symptoms do not include: pain with swallowing. Medical issues do not include: alcohol problem.    Past Medical History  Diagnosis Date  . Anemia   . ADHD (attention deficit hyperactivity disorder)   . Back pain   . Scoliosis   . 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 Headache   . Endometriosis   . Hydradenitis   . S/P total abdominal hysterectomy 06/26/2011    Past Surgical History  Procedure Date  . Abdominal hysterectomy   . Laparoscopic endometriosis fulguration   . Cesarean section     Family History  Problem Relation Age of Onset  . Heart disease Mother   . Diabetes Mother   . Hypertension Mother   . Heart disease Father   . Diabetes Father   . Hypertension Father   . Bipolar disorder Sister   . Bipolar disorder Brother   . Pancreatic cancer Maternal Grandmother     History  Substance Use Topics  . Smoking status: Current Everyday Smoker -- 1.0 packs/day    Types: Cigarettes  . Smokeless tobacco: Not on file  . Alcohol Use: No    OB History    Grav Para Term Preterm Abortions TAB SAB Ect Mult Living                  Review of Systems  Constitutional: Negative for fever.  HENT: Negative for neck pain.     Allergies  Oxycodone-acetaminophen  and Sulfonamide derivatives  Home Medications   Current Outpatient Rx  Name Route Sig Dispense Refill  . CYCLOBENZAPRINE HCL 5 MG PO TABS Oral Take 1 tablet (5 mg total) by mouth every 8 (eight) hours as needed for muscle spasms. 10 tablet 1  . FLUOXETINE HCL 20 MG PO CAPS Oral Take 1 capsule (20 mg total) by mouth daily. 30 capsule 1  . NEOMYCIN-POLYMYXIN-HC 1 % OT SOLN Both Ears Place 3 drops into both ears every 6 (six) hours. 10 mL 1  . TRAMADOL HCL 50 MG PO TABS Oral Take 1 tablet (50 mg total) by mouth every 8 (eight) hours as needed for pain. 50 tablet 3    BP 120/86  Pulse 96  Temp(Src) 99.3 F (37.4 C) (Oral)  Resp 20  SpO2 96%  Physical Exam  Constitutional: She appears well-developed and well-nourished. No distress.  HENT:  Mouth/Throat:    Neck: Carotid bruit is not present.    ED Course  Procedures (including critical care time)  Labs Reviewed - No data to display No results found.   No diagnosis found.    MDM  Dental-post-procedure, with marked erythema and active exudate-        Jimmie Molly, MD 11/21/11 1436  Jimmie Molly, MD 11/21/11 1732

## 2011-11-21 NOTE — ED Notes (Signed)
Pt  Had r  Lower  Molar  Extraction  6  Days  Ago    -  Developed  Toothace  X  2-3  Days    unreleived  By  [employer]     -  Unable  To  See  Dentist this  Week

## 2011-11-29 ENCOUNTER — Telehealth: Payer: Self-pay | Admitting: Clinical

## 2011-11-29 NOTE — Telephone Encounter (Signed)
Clinical Child psychotherapist (CSW) received referral to assist pt in finding a dentist for self pay pt. CSW contacted pt over the phone and informed pt of clinic in [place] that will begin accepting new pts in January. 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 Pt states she will stick with that dentist but appreciates CSW suggestion. No further needs addressed. Theresia Bough, MSW, Theresia Majors [phone]

## 2012-02-21 ENCOUNTER — Encounter (HOSPITAL_COMMUNITY): Payer: Self-pay | Admitting: Emergency Medicine

## 2012-02-21 ENCOUNTER — Emergency Department (HOSPITAL_COMMUNITY)
Admission: EM | Admit: 2012-02-21 | Discharge: 2012-02-21 | Disposition: A | Discharge status: Home / Self Care | Payer: Self-pay | Attending: Emergency Medicine | Admitting: Emergency Medicine

## 2012-02-21 DIAGNOSIS — F909 Attention-deficit hyperactivity disorder, unspecified type: Secondary | ICD-10-CM | POA: Insufficient documentation

## 2012-02-21 DIAGNOSIS — M4120 Other idiopathic scoliosis, site unspecified: Secondary | ICD-10-CM | POA: Insufficient documentation

## 2012-02-21 DIAGNOSIS — IMO0002 Reserved for concepts with insufficient information to code with codable children: Secondary | ICD-10-CM | POA: Insufficient documentation

## 2012-02-21 DIAGNOSIS — L0291 Cutaneous abscess, unspecified: Secondary | ICD-10-CM

## 2012-02-21 HISTORY — DX: Carrier or suspected carrier of methicillin resistant Staphylococcus aureus: Z22.322

## 2012-02-21 HISTORY — DX: Disorder of thyroid, unspecified: E07.9

## 2012-02-21 MED ORDER — HYDROCODONE-ACETAMINOPHEN 5-500 MG PO TABS: 1.0000 | ORAL_TABLET | Freq: Four times a day (QID) | ORAL | Status: AC | PRN

## 2012-02-21 MED ORDER — DOXYCYCLINE MONOHYDRATE 100 MG PO CAPS: 100.0000 mg | ORAL_CAPSULE | Freq: Two times a day (BID) | ORAL | Status: DC

## 2012-02-21 NOTE — Discharge Instructions (Signed)
Abscess An abscess (boil or furuncle) is an infected area that contains a collection of pus.  SYMPTOMS Signs and symptoms of an abscess include pain, tenderness, redness, or hardness. You may feel a moveable soft area under your skin. An abscess can occur anywhere in the body.  TREATMENT  A surgical cut (incision) may be made over your abscess to drain the pus. Gauze may be packed into the space or a drain may be looped through the abscess cavity (pocket). This provides a drain that will allow the cavity to heal from the inside outwards. The abscess may be painful for a few days, but should feel much better if it was drained.  Your abscess, if seen early, may not have localized and may not have been drained. If not, another appointment may be required if it does not get better on its own or with medications. HOME CARE INSTRUCTIONS   Only take over-the-counter or prescription medicines for pain, discomfort, or fever as directed by your caregiver.   Take your antibiotics as directed if they were prescribed. Finish them even if you start to feel better.   Keep the skin and clothes clean around your abscess.   If the abscess was drained, you will need to use gauze dressing to collect any draining pus. Dressings will typically need to be changed 3 or more times a day.   The infection may spread by skin contact with others. Avoid skin contact as much as possible.   Practice good hygiene. This includes regular hand washing, cover any draining skin lesions, and do not share personal care items.   If you participate in sports, do not share athletic equipment, towels, whirlpools, or personal care items. Shower after every practice or tournament.   If a draining area cannot be adequately covered:   Do not participate in sports.   Children should not participate in day care until the wound has healed or drainage stops.   If your caregiver has given you a follow-up appointment, it is very important  to keep that appointment. Not keeping the appointment could result in a much worse infection, chronic or permanent injury, pain, and disability. If there is any problem keeping the appointment, you must call back to this facility for assistance.  SEEK MEDICAL CARE IF:   You develop increased pain, swelling, redness, drainage, or bleeding in the wound site.   You develop signs of generalized infection including muscle aches, chills, fever, or a general ill feeling.   You have an oral temperature above 102 F (38.9 C).  MAKE SURE YOU:   Understand these instructions.   Will watch your condition.   Will get help right away if you are not doing well or get worse.  Document Released: 09/26/2005 Document Revised: 08/29/2011 Document Reviewed: 07/20/2008 [facility] Patient Information 2012 [place], [place].Abscess An abscess (boil or furuncle) is an infected area that contains a collection of pus.  SYMPTOMS Signs and symptoms of an abscess include pain, tenderness, redness, or hardness. You may feel a moveable soft area under your skin. An abscess can occur anywhere in the body.  TREATMENT  A surgical cut (incision) may be made over your abscess to drain the pus. Gauze may be packed into the space or a drain may be looped through the abscess cavity (pocket). This provides a drain that will allow the cavity to heal from the inside outwards. The abscess may be painful for a few days, but should feel much better if it was drained.  Your abscess, if seen early, may not have localized and may not have been drained. If not, another appointment may be required if it does not get better on its own or with medications. HOME CARE INSTRUCTIONS   Only take over-the-counter or prescription medicines for pain, discomfort, or fever as directed by your caregiver.   Take your antibiotics as directed if they were prescribed. Finish them even if you start to feel better.   Keep the skin and clothes clean around  your abscess.   If the abscess was drained, you will need to use gauze dressing to collect any draining pus. Dressings will typically need to be changed 3 or more times a day.   The infection may spread by skin contact with others. Avoid skin contact as much as possible.   Practice good hygiene. This includes regular hand washing, cover any draining skin lesions, and do not share personal care items.   If you participate in sports, do not share athletic equipment, towels, whirlpools, or personal care items. Shower after every practice or tournament.   If a draining area cannot be adequately covered:   Do not participate in sports.   Children should not participate in day care until the wound has healed or drainage stops.   If your caregiver has given you a follow-up appointment, it is very important to keep that appointment. Not keeping the appointment could result in a much worse infection, chronic or permanent injury, pain, and disability. If there is any problem keeping the appointment, you must call back to this facility for assistance.  SEEK MEDICAL CARE IF:   You develop increased pain, swelling, redness, drainage, or bleeding in the wound site.   You develop signs of generalized infection including muscle aches, chills, fever, or a general ill feeling.   You have an oral temperature above 102 F (38.9 C).  MAKE SURE YOU:   Understand these instructions.   Will watch your condition.   Will get help right away if you are not doing well or get worse.  Document Released: 09/26/2005 Document Revised: 08/29/2011 Document Reviewed: 07/20/2008 [facility] Patient Information 2012 [place], [place].

## 2012-02-21 NOTE — ED Notes (Signed)
EDP at bedside for I&D 

## 2012-02-21 NOTE — ED Notes (Signed)
Discharged home with rx and instructions. Verbalized understanding of same

## 2012-02-21 NOTE — ED Notes (Signed)
Pt has h/o MRSA, has a large abscess to left axilla, also has rash on face

## 2012-02-21 NOTE — ED Provider Notes (Signed)
History     CSN: [id number]  Arrival date & time 02/21/12  6578   First MD Initiated Contact with Patient 02/21/12 1117      Chief Complaint  Patient presents with  . Wound Infection    abcess under left axilla    (Consider location/radiation/quality/duration/timing/severity/associated sxs/prior treatment) HPI Patient with abscess under left arm for three days.  Patient with history of hydradenitis.  She states she has had surgery but still gets abscess.  No fever or chills.  Patient without any discharge.   Past Medical History  Diagnosis Date  . Anemia   . ADHD (attention deficit hyperactivity disorder)   . Back pain   . Scoliosis   . Leukemia at age 73  . Headache   . Endometriosis   . Hydradenitis   . S/P total abdominal hysterectomy 06/26/2011  . Thyroid disease   . MRSA (methicillin resistant staph aureus) culture positive     Past Surgical History  Procedure Date  . Abdominal hysterectomy   . Laparoscopic endometriosis fulguration   . Cesarean section     Family History  Problem Relation Age of Onset  . Heart disease Mother   . Diabetes Mother   . Hypertension Mother   . Heart disease Father   . Diabetes Father   . Hypertension Father   . Bipolar disorder Sister   . Bipolar disorder Brother   . Pancreatic cancer Maternal Grandmother     History  Substance Use Topics  . Smoking status: Current Everyday Smoker -- 1.0 packs/day    Types: Cigarettes  . Smokeless tobacco: Not on file  . Alcohol Use: No    OB History    Grav Para Term Preterm Abortions TAB SAB Ect Mult Living                  Review of Systems  All other systems reviewed and are negative.    Allergies  Oxycodone-acetaminophen and Sulfonamide derivatives  Home Medications  No current outpatient prescriptions on file.  BP 111/75  Pulse 79  Temp(Src) 97.5 F (36.4 C) (Oral)  Resp 18  SpO2 98%  Physical Exam  Nursing note and vitals reviewed. Constitutional: She is  oriented to person, place, and time. She appears well-developed and well-nourished.  HENT:  Head: Normocephalic and atraumatic.  Eyes: Conjunctivae are normal. Pupils are equal, round, and reactive to light.  Neck: Normal range of motion.  Cardiovascular: Normal rate.   Pulmonary/Chest: Effort normal.  Neurological: She is alert and oriented to person, place, and time.  Skin:       Swelling and tenderness 3x3 cm under left axilla with eythema, no adenopathy noted.     ED Course  INCISION AND DRAINAGE Date/Time: 02/21/2012 12:04 PM Performed by: Hilario Quarry Authorized by: Hilario Quarry Consent: Verbal consent obtained. Written consent not obtained. Risks and benefits: risks, benefits and alternatives were discussed Consent given by: patient Patient identity confirmed: verbally with patient and arm band Time out: Immediately prior to procedure a "time out" was called to verify the correct patient, procedure, equipment, support staff and site/side marked as required. Type: abscess Body area: upper extremity Anesthesia: local infiltration Local anesthetic: lidocaine 1% with epinephrine Anesthetic total: 2 ml Patient sedated: no Risk factor: underlying major vessel Scalpel size: 11 Needle gauge: 22 Incision type: single straight Complexity: simple Drainage: purulent Drainage amount: moderate Wound treatment: wound left open Patient tolerance: Patient tolerated the procedure well with no immediate complications.   (including  critical care time)  Labs Reviewed - No data to display No results found.   No diagnosis found.    MDM          Hilario Quarry, MD 02/21/12 [phone]

## 2012-03-11 ENCOUNTER — Emergency Department (HOSPITAL_COMMUNITY)
Admission: EM | Admit: 2012-03-11 | Discharge: 2012-03-11 | Disposition: A | Discharge status: Home Health | Payer: Self-pay | Attending: Emergency Medicine | Admitting: Emergency Medicine

## 2012-03-11 ENCOUNTER — Encounter (HOSPITAL_COMMUNITY): Payer: Self-pay | Admitting: Emergency Medicine

## 2012-03-11 DIAGNOSIS — Z8614 Personal history of Methicillin resistant Staphylococcus aureus infection: Secondary | ICD-10-CM | POA: Insufficient documentation

## 2012-03-11 DIAGNOSIS — T148XXA Other injury of unspecified body region, initial encounter: Secondary | ICD-10-CM

## 2012-03-11 DIAGNOSIS — E079 Disorder of thyroid, unspecified: Secondary | ICD-10-CM | POA: Insufficient documentation

## 2012-03-11 DIAGNOSIS — D649 Anemia, unspecified: Secondary | ICD-10-CM | POA: Insufficient documentation

## 2012-03-11 DIAGNOSIS — F909 Attention-deficit hyperactivity disorder, unspecified type: Secondary | ICD-10-CM | POA: Insufficient documentation

## 2012-03-11 DIAGNOSIS — M549 Dorsalgia, unspecified: Secondary | ICD-10-CM

## 2012-03-11 DIAGNOSIS — IMO0002 Reserved for concepts with insufficient information to code with codable children: Secondary | ICD-10-CM | POA: Insufficient documentation

## 2012-03-11 DIAGNOSIS — M4120 Other idiopathic scoliosis, site unspecified: Secondary | ICD-10-CM | POA: Insufficient documentation

## 2012-03-11 DIAGNOSIS — X58XXXA Exposure to other specified factors, initial encounter: Secondary | ICD-10-CM | POA: Insufficient documentation

## 2012-03-11 MED ORDER — CYCLOBENZAPRINE HCL 10 MG PO TABS: 10.0000 mg | ORAL_TABLET | Freq: Three times a day (TID) | ORAL | Status: AC | PRN

## 2012-03-11 MED ORDER — NAPROXEN 500 MG PO TABS: 500.0000 mg | ORAL_TABLET | Freq: Two times a day (BID) | ORAL | Status: DC

## 2012-03-11 NOTE — ED Provider Notes (Signed)
History     CSN: [id number]  Arrival date & time 03/11/12  1947   First MD Initiated Contact with Patient 03/11/12 2110      Chief Complaint  Patient presents with  . Back Pain     HPI  History provided by the patient. Patient is a 31 year old female with history of chronic back pains and scoliosis presents with increased soreness in back pain over the past several days. 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 Patient also reports having 2 young children at home that require a lot of lifting and extra work to increase her back pain and soreness. Patient has been taking over-the-counter Excedrin this regularly without significant improvements. Pain is made worse with certain positions and bending. Pain is improved some with rest and after sleep. Patient denies any other aggravating or alleviating factors. Patient denies any associated fever, chills, dysuria, urinary frequency or hematuria. She denies any abdominal pains, vaginal discharge or vaginal bleeding.    Past Medical History  Diagnosis Date  . Anemia   . ADHD (attention deficit hyperactivity disorder)   . Back pain   . Scoliosis   . Leukemia at age 46  . Headache   . Endometriosis   . Hydradenitis   . S/P total abdominal hysterectomy 06/26/2011  . Thyroid disease   . MRSA (methicillin resistant staph aureus) culture positive     Past Surgical History  Procedure Date  . Abdominal hysterectomy   . Laparoscopic endometriosis fulguration   . Cesarean section     Family History  Problem Relation Age of Onset  . Heart disease Mother   . Diabetes Mother   . Hypertension Mother   . Heart disease Father   . Diabetes Father   . Hypertension Father   . Bipolar disorder Sister   . Bipolar disorder Brother   . Pancreatic cancer Maternal Grandmother     History  Substance Use Topics  . Smoking status: Current Everyday Smoker -- 1.0 packs/day    Types: Cigarettes    . Smokeless tobacco: Not on file  . Alcohol Use: No    OB History    Grav Para Term Preterm Abortions TAB SAB Ect Mult Living                  Review of Systems  All other systems reviewed and are negative.    Allergies  Oxycodone-acetaminophen and Sulfonamide derivatives  Home Medications   Current Outpatient Rx  Name Route Sig Dispense Refill  . ASPIRIN-ACETAMINOPHEN-CAFFEINE 250-250-65 MG PO TABS Oral Take 1 tablet by mouth every 6 (six) hours as needed.    Marland Kitchen DOXYCYCLINE MONOHYDRATE 100 MG PO CAPS Oral Take 1 capsule (100 mg total) by mouth 2 (two) times daily. 14 capsule 0    BP 123/88  Pulse 83  Temp(Src) 98.5 F (36.9 C) (Oral)  Resp 16  SpO2 98%  Physical Exam  Nursing note and vitals reviewed. Constitutional: She is oriented to person, place, and time. She appears well-developed and well-nourished. No distress.  HENT:  Head: Normocephalic.  Cardiovascular: Normal rate and regular rhythm.   Pulmonary/Chest: Effort normal and breath sounds normal. No respiratory distress. She has no wheezes. She has no rales.  Abdominal: Soft. She exhibits no distension. There is no tenderness.  Musculoskeletal:       Cervical back: She exhibits no bony tenderness.       Thoracic back: She exhibits tenderness. She  exhibits no bony tenderness.       Lumbar back: She exhibits no bony tenderness.       Back:       Slight scoliosis  Neurological: She is alert and oriented to person, place, and time. She has normal strength.  Skin: Skin is warm and dry. No rash noted.  Psychiatric: She has a normal mood and affect. Her behavior is normal.    ED Course  Procedures    1. Muscle strain   2. Back pain       MDM  9:50 PM patient seen and evaluated. Patient no acute distress.        Angus Seller, [place] 03/12/12 [phone]

## 2012-03-11 NOTE — ED Notes (Signed)
PT. REPORTS MID/LOW BACK PAIN FOR SEVERAL YEARS ( SCOLIOSIS ) , DENIES RECENT INJURY OR FALL , PAIN WORSE PAST SEVERAL DAYS , AMBULATORY.

## 2012-03-11 NOTE — Discharge Instructions (Signed)
You were seen and evaluated today for your complaints of back pain. Your providers today do not feel your symptoms are caused from any emergent or concerning condition. Please followup with a primary care provider for continued evaluation and treatment of your symptoms.  Back Pain, Adult Low back pain is very common. About 1 in 5 people have back pain.The cause of low back pain is rarely dangerous. The pain often gets better over time.About half of people with a sudden onset of back pain feel better in just 2 weeks. About 8 in 10 people feel better by 6 weeks.  CAUSES Some common causes of back pain include:  Strain of the muscles or ligaments supporting the spine.   Wear and tear (degeneration) of the spinal discs.   Arthritis.   Direct injury to the back.  DIAGNOSIS Most of the time, the direct cause of low back pain is not known.However, back pain can be treated effectively even when the exact cause of the pain is unknown.Answering your caregiver's questions about your overall health and symptoms is one of the most accurate ways to make sure the cause of your pain is not dangerous. If your caregiver needs more information, he or she may order lab work or imaging tests (X-rays or MRIs).However, even if imaging tests show changes in your back, this usually does not require surgery. HOME CARE INSTRUCTIONS For many people, back pain returns.Since low back pain is rarely dangerous, it is often a condition that people can learn to [facility] their own.   Remain active. It is stressful on the back to sit or stand in one place. Do not sit, drive, or stand in one place for more than 30 minutes at a time. Take short walks on level surfaces as soon as pain allows.Try to increase the length of time you walk each day.   Do not stay in bed.Resting more than 1 or 2 days can delay your recovery.   Do not avoid exercise or work.Your body is made to move.It is not dangerous to be active, even though  your back may hurt.Your back will likely heal faster if you return to being active before your pain is gone.   Pay attention to your body when you bend and lift. Many people have less discomfortwhen lifting if they bend their knees, keep the load close to their bodies,and avoid twisting. Often, the most comfortable positions are those that put less stress on your recovering back.   Find a comfortable position to sleep. Use a firm mattress and lie on your side with your knees slightly bent. If you lie on your back, put a pillow under your knees.   Only take over-the-counter or prescription medicines as directed by your caregiver. Over-the-counter medicines to reduce pain and inflammation are often the most helpful.Your caregiver may prescribe muscle relaxant drugs.These medicines help dull your pain so you can more quickly return to your normal activities and healthy exercise.   Put ice on the injured area.   Put ice in a plastic bag.   Place a towel between your skin and the bag.   Leave the ice on for 15 to 20 minutes, 3 to 4 times a day for the first 2 to 3 days. After that, ice and heat may be alternated to reduce pain and spasms.   Ask your caregiver about trying back exercises and gentle massage. This may be of some benefit.   Avoid feeling anxious or stressed.Stress increases muscle tension and can  worsen back pain.It is important to recognize when you are anxious or stressed and learn ways to manage it.Exercise is a great option.  SEEK MEDICAL CARE IF:  You have pain that is not relieved with rest or medicine.   You have pain that does not improve in 1 week.   You have new symptoms.   You are generally not feeling well.  SEEK IMMEDIATE MEDICAL CARE IF:   You have pain that radiates from your back into your legs.   You develop new bowel or bladder control problems.   You have unusual weakness or numbness in your arms or legs.   You develop nausea or vomiting.    You develop abdominal pain.   You feel faint.  Document Released: 12/17/2005 Document Revised: 12/06/2011 Document Reviewed: 05/07/2011 [facility] Patient Information 2012 [place], [place].    Back Exercises Back exercises help treat and prevent back injuries. The goal of back exercises is to increase the strength of your abdominal and back muscles and the flexibility of your back. These exercises should be started when you no longer have back pain. Back exercises include:  Pelvic Tilt. Lie on your back with your knees bent. Tilt your pelvis until the lower part of your back is against the floor. Hold this position 5 to 10 sec and repeat 5 to 10 times.   Knee to Chest. Pull first 1 knee up against your chest and hold for 20 to 30 seconds, repeat this with the other knee, and then both knees. This may be done with the other leg straight or bent, whichever feels better.   Sit-Ups or Curl-Ups. Bend your knees 90 degrees. Start with tilting your pelvis, and do a partial, slow sit-up, lifting your trunk only 30 to 45 degrees off the floor. Take at least 2 to 3 seconds for each sit-up. Do not do sit-ups with your knees out straight. If partial sit-ups are difficult, simply do the above but with only tightening your abdominal muscles and holding it as directed.   Hip-Lift. Lie on your back with your knees flexed 90 degrees. Push down with your feet and shoulders as you raise your hips a couple inches off the floor; hold for 10 seconds, repeat 5 to 10 times.   Back arches. Lie on your stomach, propping yourself up on bent elbows. Slowly press on your hands, causing an arch in your low back. Repeat 3 to 5 times. Any initial stiffness and discomfort should lessen with repetition over time.   Shoulder-Lifts. Lie face down with arms beside your body. Keep hips and torso pressed to floor as you slowly lift your head and shoulders off the floor.  Do not overdo your exercises, especially in the beginning.  Exercises may cause you some mild back discomfort which lasts for a few minutes; however, if the pain is more severe, or lasts for more than 15 minutes, do not continue exercises until you see your caregiver. Improvement with exercise therapy for back problems is slow.  See your caregivers for assistance with developing a proper back exercise program. Document Released: 01/24/2005 Document Revised: 12/06/2011 Document Reviewed: 12/17/2005 [facility] Patient Information 2012 [place], [place].    RESOURCE GUIDE  Dental Problems  Patients with Medicaid: [facility] [phone] [address]  [address] Phone:  [phone]                                                  Phone:  [phone]  If unable to pay or uninsured, contact:  [facility] or [facility]. to become qualified for the adult dental clinic.  Chronic Pain Problems Contact [facility]  [phone] Patients need to be referred by their primary care doctor.  Insufficient Money for Medicine Contact [employer]:  call "211" or [facility] 418-871-7508.  No Primary Care Doctor Call [facility]  972-046-3288 Other agencies that provide inexpensive medical care    [facility]  [phone]    [facility]  [phone]    [facility]  [phone]    [facility]  [phone]    [facility]  437-848-8504    [facility]  [phone]  Psychological Services [facility]  [phone] [facility]  [phone] [facility] Mental Health   ([phone] (emergency services 507-052-8198)  Substance Abuse Resources [facility]  620-868-4874 [facility] 934-585-9413 [facility] [place] 367-875-1000 Floydene Flock [phone] Residential & Outpatient Substance Abuse Program  671-656-9534  Abuse/Neglect [facility] Child  Abuse Hotline 781-212-9935 [facility] Child Abuse Hotline (620) 531-9949 (After Hours)  Emergency Shelter [facility] 816-264-7427  M[facility] [phone] [facility] [phone]  MRSA Hotline #:   772-612-2764    [facility] Resources  [facility]     [employer]                          [facility] Dept. [address] [place]                       [address]      [address]  Blondell Reveal Phone:  [phone]                                   Phone:  [phone]                 Phone:  [phone]  [facility] Mental Health Phone:  [phone]  [facility] Child Abuse Hotline (281)776-4454 873-205-4374 (After Hours)

## 2012-03-13 NOTE — ED Provider Notes (Signed)
History/physical exam/procedure(s) were performed by non-physician practitioner and as supervising physician I was immediately available for consultation/collaboration. I have reviewed all notes and am in agreement with care and plan.   Abbas Beyene S Marybeth Dandy, MD 03/13/12 1302 

## 2012-03-22 ENCOUNTER — Emergency Department (HOSPITAL_COMMUNITY)
Admission: EM | Admit: 2012-03-22 | Discharge: 2012-03-22 | Disposition: A | Discharge status: Home / Self Care | Payer: Self-pay | Attending: Emergency Medicine | Admitting: Emergency Medicine

## 2012-03-22 ENCOUNTER — Emergency Department (HOSPITAL_COMMUNITY): Payer: Self-pay

## 2012-03-22 ENCOUNTER — Encounter (HOSPITAL_COMMUNITY): Payer: Self-pay

## 2012-03-22 DIAGNOSIS — R1900 Intra-abdominal and pelvic swelling, mass and lump, unspecified site: Secondary | ICD-10-CM | POA: Insufficient documentation

## 2012-03-22 DIAGNOSIS — F17200 Nicotine dependence, unspecified, uncomplicated: Secondary | ICD-10-CM | POA: Insufficient documentation

## 2012-03-22 DIAGNOSIS — Z856 Personal history of leukemia: Secondary | ICD-10-CM | POA: Insufficient documentation

## 2012-03-22 DIAGNOSIS — Z8614 Personal history of Methicillin resistant Staphylococcus aureus infection: Secondary | ICD-10-CM | POA: Insufficient documentation

## 2012-03-22 DIAGNOSIS — R222 Localized swelling, mass and lump, trunk: Secondary | ICD-10-CM

## 2012-03-22 DIAGNOSIS — M4120 Other idiopathic scoliosis, site unspecified: Secondary | ICD-10-CM | POA: Insufficient documentation

## 2012-03-22 DIAGNOSIS — R1031 Right lower quadrant pain: Secondary | ICD-10-CM | POA: Insufficient documentation

## 2012-03-22 DIAGNOSIS — Z9079 Acquired absence of other genital organ(s): Secondary | ICD-10-CM | POA: Insufficient documentation

## 2012-03-22 DIAGNOSIS — Z8742 Personal history of other diseases of the female genital tract: Secondary | ICD-10-CM | POA: Insufficient documentation

## 2012-03-22 LAB — URINALYSIS, ROUTINE W REFLEX MICROSCOPIC
Glucose, UA: NEGATIVE mg/dL
Hgb urine dipstick: NEGATIVE
Leukocytes, UA: NEGATIVE
Specific Gravity, Urine: 1.026 (ref 1.005–1.030)
Urobilinogen, UA: 1 mg/dL (ref 0.0–1.0)

## 2012-03-22 LAB — POCT I-STAT, CHEM 8
Creatinine, Ser: 0.7 mg/dL (ref 0.50–1.10)
Hemoglobin: 15 g/dL (ref 12.0–15.0)
Potassium: 3.7 mEq/L (ref 3.5–5.1)
Sodium: 142 mEq/L (ref 135–145)

## 2012-03-22 MED ORDER — HYDROCODONE-ACETAMINOPHEN 5-325 MG PO TABS
2.0000 | ORAL_TABLET | Freq: Once | ORAL | Status: AC
  Administered 2012-03-22: 2 via ORAL
  Filled 2012-03-22: qty 2

## 2012-03-22 MED ORDER — IOHEXOL 300 MG/ML  SOLN
100.0000 mL | Freq: Once | INTRAMUSCULAR | Status: AC | PRN
  Administered 2012-03-22: 100 mL via INTRAVENOUS

## 2012-03-22 MED ORDER — HYDROCODONE-ACETAMINOPHEN 5-500 MG PO TABS: 1.0000 | ORAL_TABLET | Freq: Four times a day (QID) | ORAL | Status: DC | PRN

## 2012-03-22 NOTE — Discharge Instructions (Signed)
Pelvic Mass  A "mass" is a lump that either your caregiver found during an examination or you found before seeing your caregiver. The "pelvis" is the lower portion of the trunk in between the hip bones. There are many possible reasons why a lump has appeared. Testing will help determine the cause and the steps to a solution.  CAUSES   Before complete testing is done, it may be difficult or impossible for your caregiver to know if the lump is truly in one of the pelvic organs (such as the uterus or ovaries) or is coming from one of many organs that are near the pelvis. Problems in the colon or kidney can also lead to a lump that might seem to be in the pelvis.  If testing shows that the mass is in the pelvis, there are still many possible causes:   Tumors and cancers. These problems are relatively common and are the greatest source of worry for patients. Cancerous lumps in the pelvis may be due to cancers that started in the uterus or ovaries or due to cancers that started in other areas and then spread to the pelvis. Many cancers are very treatable when found early.   Non-cancerous tumors and masses. There are a large number of common and uncommon non-cancerous problems that can lead to a mass in the pelvis. Two very common ones are fibroids of the uterus and ovarian cysts. Before testing and/or surgery, it may be impossible to tell the difference between these problems and a cancer.   Infection. Certain types of infections can produce a mass in the pelvis. The infection might be caused by bacteria. If there is an infection treatment might include antibiotics. Masses from infection can also be caused by certain viruses, and in rare cases, by fungi or parasites. If infection is the cause, your caregiver will be able to determine the type of germ responsible for the mass by doing appropriate testing.   Inflammatory bowel disease. These are diseases thought to be caused by a defect in the immune system of the  intestine. There are two inflammatory bowel diseases: Ulcerative Colitis and Crohn's Disease. They are lifelong problems with symptoms that can come and go. Sometimes, patients with these diseases will develop a mass in the lower part of the colon that can make it seem as though there is a mass in the pelvis.   Past Surgery. If there has been pelvic surgery in the past, and there is a lot of scarring that forms during the process of healing, this can eventually fell like a mass when examined by your caregiver. As with the other problems described above, this may or may not be associated with symptoms or feeling badly.   Ectopic Pregnancy. This is a condition where the growing fetus is growing outside of the uterus. This is a common cause for a pelvic mass and may become a serious or life-threatening problem that requires immediate surgery.  SYMPTOMS   In people with a pelvic mass, there may be a large variety of associated symptoms including:    No symptoms, other than the appearance of the mass itself.   Cramping, nausea, diarrhea.   Fever, vomiting, weakness.   Pelvic, side, and/or back pain.   Weight loss.   Constipation.   Problems with vaginal bleeding. This can be very variable. Bleeding might be very light or very heavy. Bleeding may be mixed with large clots. Menstruation may be very frequent and may seem to almost completely stop.   There may be varying levels of pain with menstruation.   Urinary symptoms including frequent urination, inability to empty the bladder completely, or urinating very small amounts.  DIAGNOSIS   Because of the large number of causes of a mass in the pelvis, your caregiver will ask you to undergo testing in order to get a clear diagnosis in a timely manner. The tests might include some or all of the following:   Blood tests such as a blood count, measurement of common minerals in the blood, kidney/liver/pancreas function, pregnancy test, and others.   X-rays. Plain x-rays  and special x-rays may be requested except if you are pregnant.   Ultrasound. This is a test that uses sound waves to "paint a picture" of the mass. The type of "sound picture" that is seen can help to narrow the diagnosis.   CAT scan and MRI imaging. Each can provide additional information as to the different characteristics of the mass and can help to develop a final plan for diagnostics and treatment. If cancer is suspected, these special tests can also help to show any spread of the cancer to other parts of the body. It is possible that these tests may not be ordered if you are pregnant.   Laparoscopy. This is a special exam of the inside of the pelvic area using a slim, flexible, lighted tube. This allows your caregiver to get a direct look at the mass. Sometimes, this allows getting a very small piece of the mass (a biopsy). This piece of tissue can then be examined in a lab that will frequently lead to a clear diagnosis. In some cases, your caregiver can use a laparoscope to completely remove the mass after it has been examined.   Surgery. Sometimes, a diagnosis can only be made by carrying out an operation and obtaining a biopsy (as noted above). Many times, the biopsy is obtained and the mass is removed during the same operation.  These are the most common ways for determining the exact cause of the mass. Your caregiver may recommend other tests that are not listed here.  TREATMENT   Treatment(s) can only be recommended after a diagnosis is made. Your caregiver will discuss your test results with you, the meanings of the tests, and the recommended steps to begin treatment. He/she will also recommend whether you need to be examined by specialists as you go through the steps of diagnosis and a treatment plan is developed.  HOME CARE INSTRUCTIONS    Test preparation. Carefully follow instructions when preparing for certain tests. This may involve many things such as:   Drinking fluids to fill the bladder  before a pelvic ultrasound.   Fasting before certain blood tests.   Drinking special "contrast" fluids that are necessary for obtaining the best CAT scan and MRI images.   Medications. Your caregiver may prescribe medications to help relieve symptoms while you undergo testing. It is important that your current medications (prescription, non-prescription, herbal, vitamins, etc.) be kept in mind when new prescriptions are recommended.   Diet. There may be a need for changes in diet in order to help with symptom relief while testing is being done. If this applies to you, your caregiver will discuss these changes with you.  SEEK MEDICAL CARE IF:    You cannot hold down any of the recommended fluids used to prepare for tests such as CAT scan MRI.   You feel that you are having trouble with any new prescriptions.   You   develop new symptoms of pain, vomiting, diarrhea, fever, or other problems that you did not feel since your last exam.   You experience inability to empty your bladder completely or develop painful and/or bloody urination.  SEEK IMMEDIATE MEDICAL CARE IF:    You vomit bright red blood, or a coffee ground appearing material.   You have blood in your stools, or the stools turn black and tarry.   You have an abnormal or increased amount of vaginal bleeding.   You have a fever.   You develop easy bruising or bleeding.   You develop pain that is not controlled by your medication.   You feel worsening weakness or you have a fainting episode.   You feel that the mass has suddenly gotten larger.   You develop severe bloating in the abdomen and/or pelvis.   You cannot pass any urine.  MAKE SURE YOU:    Understand these instructions.   Will watch your condition.   Will get help right away if you are not doing well or get worse.  Document Released: 03/26/2007 Document Revised: 12/06/2011 Document Reviewed: 12/02/2007  [employer] Patient Information 2012 [employer].

## 2012-03-22 NOTE — ED Provider Notes (Signed)
History     CSN: [id number]  Arrival date & time 03/22/12  1643   First MD Initiated Contact with Patient 03/22/12 1659      Chief Complaint  Patient presents with  . Groin Pain    growth to rt groin x 1 year PCP aware, increased pain x 2 days- norco 1/2 tablet 1hour ago    (Consider location/radiation/quality/duration/timing/severity/associated sxs/prior treatment) HPI Comments: Pt has had a knot in her right lower abd for about one year.  Over last week, has been enlarging and has become more painful.  No n/v.  No fevers.  No vag bleeding or discharge.  Patient is a 31 y.o. female presenting with abdominal pain. The history is provided by the patient.  Abdominal Pain The primary symptoms of the illness include abdominal pain. The primary symptoms of the illness do not include fever, fatigue, shortness of breath, nausea, vomiting or diarrhea. Episode onset: one year ago. The onset of the illness was gradual. The problem has been gradually worsening.  The patient states that she believes she is currently not pregnant. The patient has not had a change in bowel habit. Symptoms associated with the illness do not include chills, diaphoresis, constipation, urgency, hematuria, frequency or back pain.    Past Medical History  Diagnosis Date  . Anemia   . ADHD (attention deficit hyperactivity disorder)   . Back pain   . Scoliosis   . Leukemia at age 33  . Headache   . Endometriosis   . Hydradenitis   . S/P total abdominal hysterectomy 06/26/2011  . Thyroid disease   . MRSA (methicillin resistant staph aureus) culture positive     Past Surgical History  Procedure Date  . Abdominal hysterectomy   . Laparoscopic endometriosis fulguration   . Cesarean section     Family History  Problem Relation Age of Onset  . Heart disease Mother   . Diabetes Mother   . Hypertension Mother   . Heart disease Father   . Diabetes Father   . Hypertension Father   . Bipolar disorder Sister   .  Bipolar disorder Brother   . Pancreatic cancer Maternal Grandmother     History  Substance Use Topics  . Smoking status: Current Everyday Smoker -- 1.0 packs/day    Types: Cigarettes  . Smokeless tobacco: Not on file  . Alcohol Use: No    OB History    Grav Para Term Preterm Abortions TAB SAB Ect Mult Living                  Review of Systems  Constitutional: Negative for fever, chills, diaphoresis and fatigue.  HENT: Negative for congestion, rhinorrhea and sneezing.   Eyes: Negative.   Respiratory: Negative for cough, chest tightness and shortness of breath.   Cardiovascular: Negative for chest pain and leg swelling.  Gastrointestinal: Positive for abdominal pain. Negative for nausea, vomiting, diarrhea, constipation and blood in stool.  Genitourinary: Negative for urgency, frequency, hematuria, flank pain and difficulty urinating.  Musculoskeletal: Negative for back pain and arthralgias.  Skin: Negative for rash.  Neurological: Negative for dizziness, speech difficulty, weakness, numbness and headaches.    Allergies  Oxycodone-acetaminophen and Sulfonamide derivatives  Home Medications   Current Outpatient Rx  Name Route Sig Dispense Refill  . ASPIRIN-ACETAMINOPHEN-CAFFEINE 250-250-65 MG PO TABS Oral Take 1 tablet by mouth every 6 (six) hours as needed. For pain    . DOXYCYCLINE MONOHYDRATE 100 MG PO CAPS Oral Take 1 capsule (100 mg  total) by mouth 2 (two) times daily. 14 capsule 0  . HYDROCODONE-ACETAMINOPHEN 5-500 MG PO TABS Oral Take 1 tablet by mouth every 6 (six) hours as needed.    . CYCLOBENZAPRINE HCL 10 MG PO TABS Oral Take 1 tablet (10 mg total) by mouth 3 (three) times daily as needed for muscle spasms. 30 tablet 0  . NAPROXEN 500 MG PO TABS Oral Take 1 tablet (500 mg total) by mouth 2 (two) times daily. 30 tablet 0    BP 124/92  Pulse 92  Temp(Src) 98.9 F (37.2 C) (Oral)  Resp 16  Ht 5\' 1"  (1.549 m)  Wt 130 lb (58.968 kg)  BMI 24.56 kg/m2  SpO2  99%  Physical Exam  Constitutional: She is oriented to person, place, and time. She appears well-developed and well-nourished.  HENT:  Head: Normocephalic and atraumatic.  Eyes: Pupils are equal, round, and reactive to light.  Neck: Normal range of motion. Neck supple.  Cardiovascular: Normal rate, regular rhythm and normal heart sounds.   Pulmonary/Chest: Effort normal and breath sounds normal. No respiratory distress. She has no wheezes. She has no rales. She exhibits no tenderness.  Abdominal: Soft. Bowel sounds are normal. There is no tenderness. There is no rebound and no guarding.       2cm firm knot in right lower abd wall.  +TTP at this area  Musculoskeletal: Normal range of motion. She exhibits no edema.  Lymphadenopathy:    She has no cervical adenopathy.  Neurological: She is alert and oriented to person, place, and time.  Skin: Skin is warm and dry. No rash noted.  Psychiatric: She has a normal mood and affect.    ED Course  Procedures (including critical care time)  Results for orders placed during the hospital encounter of 03/22/12  URINALYSIS, ROUTINE W REFLEX MICROSCOPIC      Component Value Range   Color, Urine YELLOW  YELLOW    APPearance CLOUDY (*) CLEAR    Specific Gravity, Urine 1.026  1.005 - 1.030    pH 6.0  5.0 - 8.0    Glucose, UA NEGATIVE  NEGATIVE (mg/dL)   Hgb urine dipstick NEGATIVE  NEGATIVE    Bilirubin Urine NEGATIVE  NEGATIVE    Ketones, ur NEGATIVE  NEGATIVE (mg/dL)   Protein, ur NEGATIVE  NEGATIVE (mg/dL)   Urobilinogen, UA 1.0  0.0 - 1.0 (mg/dL)   Nitrite NEGATIVE  NEGATIVE    Leukocytes, UA NEGATIVE  NEGATIVE   POCT I-STAT, CHEM 8      Component Value Range   Sodium 142  135 - 145 (mEq/L)   Potassium 3.7  3.5 - 5.1 (mEq/L)   Chloride 106  96 - 112 (mEq/L)   BUN 8  6 - 23 (mg/dL)   Creatinine, Ser 1.61  0.50 - 1.10 (mg/dL)   Glucose, Bld 80  70 - 99 (mg/dL)   Calcium, Ion 0.96  0.45 - 1.32 (mmol/L)   TCO2 26  0 - 100 (mmol/L)    Hemoglobin 15.0  12.0 - 15.0 (g/dL)   HCT 40.9  81.1 - 91.4 (%)   Ct Abdomen Pelvis W Contrast  03/22/2012  *RADIOLOGY REPORT*  Clinical Data: Right lower quadrant pain and palpable abnormality. Previous hysterectomy.  CT ABDOMEN AND PELVIS WITH CONTRAST  Technique:  Multidetector CT imaging of the abdomen and pelvis was performed following the standard protocol during bolus administration of intravenous contrast.  Contrast:  100 ml Omnipaque-300 and oral contrast  Comparison: 07/03/2008  Findings: The abdominal  parenchymal organs are normal appearance except for a simple cyst in the upper pole of the right kidney measuring 3.7 cm which is decreased in size since previous study. Gallbladder is unremarkable.  No evidence of hydronephrosis.  No abdominal soft tissue masses or lymphadenopathy identified.  Previous hysterectomy noted.  A small cyst is seen in the right adnexa measuring 2.4 cm, likely representing a physiologic cyst in a reproductive age female. In addition, there is a solid enhancing mass seen in the subcutaneous tissues of the right lower quadrant anterior abdominal wall which measures 1.7 x 2.0 cm.  This is suspicious for endometriosis, at site of previous surgical incision.  No evidence of abdominal wall hernia.  No inflammatory process or lymphadenopathy identified within the abdomen or pelvis.  No evidence of abnormal fluid collections.  No evidence of bowel wall thickening or dilatation.  IMPRESSION:  1.  2 cm enhancing mass in the deep subcutaneous tissues of the right lower quadrant anterior abdominal wall, suspicious for endometriosis at site of previous surgical incision.  Consideration of biopsy or surgical removal is recommended. 2.  2.4 cm right adnexal cyst, most likely representing a physiologic ovarian cyst in a reproductive age female.  Original Report Authenticated By: Danae Orleans, M.D.     Ct Abdomen Pelvis W Contrast  03/22/2012  *RADIOLOGY REPORT*  Clinical Data: Right  lower quadrant pain and palpable abnormality. Previous hysterectomy.  CT ABDOMEN AND PELVIS WITH CONTRAST  Technique:  Multidetector CT imaging of the abdomen and pelvis was performed following the standard protocol during bolus administration of intravenous contrast.  Contrast:  100 ml Omnipaque-300 and oral contrast  Comparison: 07/03/2008  Findings: The abdominal parenchymal organs are normal appearance except for a simple cyst in the upper pole of the right kidney measuring 3.7 cm which is decreased in size since previous study. Gallbladder is unremarkable.  No evidence of hydronephrosis.  No abdominal soft tissue masses or lymphadenopathy identified.  Previous hysterectomy noted.  A small cyst is seen in the right adnexa measuring 2.4 cm, likely representing a physiologic cyst in a reproductive age female. In addition, there is a solid enhancing mass seen in the subcutaneous tissues of the right lower quadrant anterior abdominal wall which measures 1.7 x 2.0 cm.  This is suspicious for endometriosis, at site of previous surgical incision.  No evidence of abdominal wall hernia.  No inflammatory process or lymphadenopathy identified within the abdomen or pelvis.  No evidence of abnormal fluid collections.  No evidence of bowel wall thickening or dilatation.  IMPRESSION:  1.  2 cm enhancing mass in the deep subcutaneous tissues of the right lower quadrant anterior abdominal wall, suspicious for endometriosis at site of previous surgical incision.  Consideration of biopsy or surgical removal is recommended. 2.  2.4 cm right adnexal cyst, most likely representing a physiologic ovarian cyst in a reproductive age female.  Original Report Authenticated By: Danae Orleans, M.D.     1. Abdominal wall mass       MDM  Will refer to gyn for possible biopsy.        Rolan Bucco, MD 03/22/12 [id number]

## 2012-03-22 NOTE — ED Notes (Signed)
Rt groin pain R/T ? Growth to lower abdomin.  PCP aware x 1 year - recent increase in pain x 2 days.  Denies injury.

## 2012-03-22 NOTE — ED Notes (Signed)
MD at bedside. 

## 2012-03-24 ENCOUNTER — Telehealth: Payer: Self-pay | Admitting: Family Medicine

## 2012-03-24 NOTE — Telephone Encounter (Signed)
Please have pt come in to see me for a ER follow up appointment so we can discuss in more detail

## 2012-03-24 NOTE — Telephone Encounter (Signed)
Patient has been scheduled for this Wednesday.  She wanted to let you know that even though they gave her Hydrocodone, she is still in a lot of pain.

## 2012-03-24 NOTE — Telephone Encounter (Signed)
Patient is calling after going to ER at [facility].  She was told she had a mass on her ovary and referred her to an MD in [place].  She needs a referral to an OB/GYN that takes the [employer].

## 2012-03-24 NOTE — Telephone Encounter (Signed)
Patient calling back about referral for OB/Gyn.  She is very anxious about her diagnosis and would like a call as soon as possible.

## 2012-03-26 ENCOUNTER — Ambulatory Visit (INDEPENDENT_AMBULATORY_CARE_PROVIDER_SITE_OTHER): Payer: Self-pay | Admitting: Family Medicine

## 2012-03-26 ENCOUNTER — Encounter: Payer: Self-pay | Admitting: Family Medicine

## 2012-03-26 DIAGNOSIS — R109 Unspecified abdominal pain: Secondary | ICD-10-CM

## 2012-03-26 MED ORDER — HYDROCODONE-ACETAMINOPHEN 5-500 MG PO TABS: 1.0000 | ORAL_TABLET | Freq: Four times a day (QID) | ORAL | Status: DC | PRN

## 2012-03-26 NOTE — Progress Notes (Signed)
  Subjective:    Patient ID: [name], female    DOB: 1981/07/18, 31 y.o.   MRN: [id number]  HPI Abdominal pain followup: Patient evaluated in the ER earlier this week for abdominal pain. Abdominal pain has been constant x1 week. Located in right lower quadrant. CT scan in the ER found to have a subcutaneous mass 2 cm. Also found right adnexal cyst. Patient states she is concerned that she has cancer and wants this mass taken out. States the pain is 10 out of 10. Vicodin seems to help only a little bit. Nothing seems to make it better. Lifting and moving around the house seems to make it worse. States that she did have vaginal spotting for a few days approximately 3 months ago and none since then. In 2009 she had a hysterectomy. She is unsure if the surgeon removed her cervix. Denies dysuria. Occasionally has symptoms of retention and urinary frequency. Occasional back pain off and on-has chronic back pain. UA negative in ER. Lab work negative in the ER this week. No nausea. No vomiting. No diarrhea. No dark stools. No bloody stools. No vaginal bleeding currently.  Review of Systems As per above.    Objective:   Physical Exam  Constitutional: She appears well-developed and well-nourished.  HENT:  Head: Normocephalic and atraumatic.  Cardiovascular: Normal rate, regular rhythm and normal heart sounds.   No murmur heard. Pulmonary/Chest: Effort normal. No respiratory distress. She has no wheezes. She has no rales.  Abdominal: Soft. Bowel sounds are normal. She exhibits mass. There is no rebound and no guarding.       Small quarter sized round mass located subcutaneous tissue in lateral right lower quadrant. + tenderness to palpation in this area.  Otherwise, abdomen non tender.   Musculoskeletal: She exhibits no edema.          Assessment & Plan:

## 2012-03-26 NOTE — Telephone Encounter (Signed)
Discussed her pain at today's appointment- please see office visit note.

## 2012-03-27 ENCOUNTER — Telehealth: Payer: Self-pay | Admitting: Family Medicine

## 2012-03-27 ENCOUNTER — Encounter: Payer: Self-pay | Admitting: *Deleted

## 2012-03-27 ENCOUNTER — Other Ambulatory Visit: Payer: Self-pay | Admitting: Family Medicine

## 2012-03-27 NOTE — Telephone Encounter (Signed)
Patient is calling because the earliest she can get in for the Biopsy will be the beginning of May.  The mass is growing and she is in a lot of pain.  She has to take 2 of Hydrocodone and it does help, but she is concerned that the mass is cancerous and the pain is going to get unmanageable.

## 2012-03-27 NOTE — Telephone Encounter (Signed)
[facility] be [facility] up to within the next week.  I will change the order to stat/urgent.  Please let me know if I can facilitate this in anyway.

## 2012-03-27 NOTE — Telephone Encounter (Signed)
I called [id number] up appt for pt per Dr Edmonia James. They advised me that 5/2 was the !st they had as pt needs to see a surgeon and they only have 4. They advised pt to got to their [facility]U if pain got worse. Pt is advised and voiced understanding.

## 2012-03-28 DIAGNOSIS — R109 Unspecified abdominal pain: Secondary | ICD-10-CM | POA: Insufficient documentation

## 2012-03-28 NOTE — Assessment & Plan Note (Addendum)
Will refer patient to Ob/GYN for further evaluation of right subcutaneous mass- to evaluate for possible biopsy or removal asap.  Pt also to discuss abdominal pain and episode of vaginal spotting that occurred 3 months ago with OB/GYN consult.  Pt to return to see me in 2 weeks.  Will give vicodin for pain relief to get patient through until follow up with OB/GYN.   Greater than 50% of face time spent in counseling patient.

## 2012-04-01 NOTE — Telephone Encounter (Signed)
Patient is calling back asking for a refill on her pain medication.  She would like to speak to Dr. Edmonia James about below.

## 2012-04-01 NOTE — Telephone Encounter (Signed)
Forward to PCP for refill request on pain meds

## 2012-04-02 NOTE — Telephone Encounter (Signed)
Will not refill pain meds, will need to be seen if she needs more.  Please call pt and tell her.

## 2012-04-02 NOTE — Telephone Encounter (Signed)
Called patient and told her she would need office visit for pain meds, she agreed to come in on Friday with cross cover.Osie Merkin, [name]

## 2012-04-04 ENCOUNTER — Encounter: Payer: Self-pay | Admitting: Family Medicine

## 2012-04-04 ENCOUNTER — Ambulatory Visit (INDEPENDENT_AMBULATORY_CARE_PROVIDER_SITE_OTHER): Payer: Self-pay | Admitting: Family Medicine

## 2012-04-04 VITALS — BP 138/98 | HR 69 | Temp 98.4°F | Ht 61.0 in | Wt 142.9 lb

## 2012-04-04 DIAGNOSIS — R300 Dysuria: Secondary | ICD-10-CM

## 2012-04-04 DIAGNOSIS — R109 Unspecified abdominal pain: Secondary | ICD-10-CM

## 2012-04-04 LAB — CBC WITH DIFFERENTIAL/PLATELET
Eosinophils Absolute: 0.1 10*3/uL (ref 0.0–0.7)
Eosinophils Relative: 1 % (ref 0–5)
Hemoglobin: 15.1 g/dL — ABNORMAL HIGH (ref 12.0–15.0)
Lymphocytes Relative: 25 % (ref 12–46)
Lymphs Abs: 2.1 10*3/uL (ref 0.7–4.0)
MCH: 27.9 pg (ref 26.0–34.0)
MCV: 86.7 fL (ref 78.0–100.0)
Monocytes Relative: 7 % (ref 3–12)
Platelets: 194 10*3/uL (ref 150–400)
RBC: 5.42 MIL/uL — ABNORMAL HIGH (ref 3.87–5.11)
WBC: 8.5 10*3/uL (ref 4.0–10.5)

## 2012-04-04 LAB — COMPLETE METABOLIC PANEL WITH GFR
ALT: 10 U/L (ref 0–35)
AST: 14 U/L (ref 0–37)
Alkaline Phosphatase: 61 U/L (ref 39–117)
BUN: 9 mg/dL (ref 6–23)
Chloride: 107 mEq/L (ref 96–112)
Creat: 0.72 mg/dL (ref 0.50–1.10)
Total Bilirubin: 0.4 mg/dL (ref 0.3–1.2)

## 2012-04-04 LAB — POCT URINALYSIS DIPSTICK
Glucose, UA: NEGATIVE
Leukocytes, UA: NEGATIVE
Nitrite, UA: NEGATIVE
Protein, UA: NEGATIVE
Spec Grav, UA: 1.025
Urobilinogen, UA: 0.2

## 2012-04-04 MED ORDER — HYDROCODONE-ACETAMINOPHEN 5-500 MG PO TABS: 1.0000 | ORAL_TABLET | Freq: Four times a day (QID) | ORAL | Status: DC | PRN

## 2012-04-04 MED ORDER — DOXYCYCLINE MONOHYDRATE 100 MG PO CAPS: 100.0000 mg | ORAL_CAPSULE | Freq: Two times a day (BID) | ORAL | Status: DC

## 2012-04-04 NOTE — Assessment & Plan Note (Signed)
Seems several different causes of abdominal pain. 1) left flank pain: May be early kidney stone but urinalysis failed to show blood leukocyte Estrace or nitrite.  Plan that she would pain medicine and followup with primary care doctor. Additionally we'll culture her urine and obtain CBC and has a metabolic panel.   2) right lower cauldron/inguinal pain: Located at the subcutaneous mass. CT read said likely endometritis which I think is the most likely explanation here. I doubt this is a malignant tumor.  Regardless she is a biopsy scheduled in one month.  I see no reason at this point to accelerate or urgent refer to biopsy.  Masses not appear to be enlarging based on my exam and review of prior reports and CT scan.   3) possible hidradenitis versus cellulitis.  Possible perhaps doubtful. Regardless will initiate doxycycline therapy for one week Discussed warning signs for a return to health care. Patient expresses understanding.

## 2012-04-04 NOTE — Progress Notes (Signed)
[name] is a 31 y.o. female who presents to [facility] today for   1) left flank pain started this morning associated with bladder pressure and urinary frequency. No fevers or chills vomiting or diarrhea. Mild nausea. The pain radiates to the left groin. This is consistent with prior kidney stones.   2) exacerbation of chronic right lower quadrant pain. Patient has right lower quadrant pain localized to a subcutaneous mass that was evaluated recently on CT scan. She has an appointment scheduled in one month with OB/GYN for biopsy of this mass. The mass is mildly tender. And the patient feels like it is growing.  3) hidradenitis: Patient takes doxycycline intermittent for hidradenitis.  She thinks she is having a recurrence of groin hidradenitis and wishes to refill her doxycycline.    PMH, SH reviewed: Significant for chronic abdominal pain, anxiety and smoking ROS as above otherwise neg. No Chest pain, palpitations, SOB, Fever, Chills, , V/D.  Medications reviewed. Current Outpatient Prescriptions  Medication Sig Dispense Refill  . aspirin-acetaminophen-caffeine (EXCEDRIN MIGRAINE) 250-250-65 MG per tablet Take 1 tablet by mouth every 6 (six) hours as needed. For pain      . doxycycline (MONODOX) 100 MG capsule Take 1 capsule (100 mg total) by mouth 2 (two) times daily.  14 capsule  0  . naproxen (NAPROSYN) 500 MG tablet Take 1 tablet (500 mg total) by mouth 2 (two) times daily.  30 tablet  0  . HYDROcodone-acetaminophen (VICODIN) 5-500 MG per tablet Take 1-2 tablets by mouth every 6 (six) hours as needed for pain.  40 tablet  0    Exam:  BP 138/98  Pulse 69  Temp(Src) 98.4 F (36.9 C) (Oral)  Ht 5\' 1"  (1.549 m)  Wt 142 lb 14.4 oz (64.819 kg)  BMI 27.00 kg/m2 Gen: Well NAD, , anxious-appearing HEENT: EOMI,  MMM Lungs: CTABL Nl WOB Heart: RRR no MRG Abd: NABS, nondistended. Nontender to palpation.  No masses palpated in the abdomen.  Mild left costovertebral angle tenderness.   Skin:  Small subcutaneous mass approximately 1 cm palpated in the right inguinal area. Mildly tender.   Small erythema mildly tender in the groin superficially. No significant induration or fluctuance noted. Exts: Non edematous BL  LE, warm and well perfused.   Results for orders placed in visit on 04/04/12 (from the past 72 hour(s))  POCT URINALYSIS DIPSTICK     Status: Normal   Collection Time   04/04/12 10:03 AM      Component Value Range Comment   Color, UA yellow      Clarity, UA clear      Glucose, UA negative      Bilirubin, UA negative      Ketones, UA negative      Spec Grav, UA 1.025      Blood, UA negative      pH, UA 6.0      Protein, UA negative      Urobilinogen, UA 0.2      Nitrite, UA negative      Leukocytes, UA Negative

## 2012-04-04 NOTE — Patient Instructions (Signed)
Thank you for coming in today. I am not sure where your pain is coming from.  We are doing some tests today.  I am refilling your doxy and your pain medicine.  Follow up with Dr. Edmonia James within 1 month.  Let me know if your pain worsens or you develop vomiting, fever or chills.  Come back or go to the emergency room if you notice new weakness new numbness problems walking or bowel or bladder problems.

## 2012-04-06 LAB — URINE CULTURE
Colony Count: NO GROWTH
Organism ID, Bacteria: NO GROWTH

## 2012-04-10 ENCOUNTER — Encounter: Payer: Self-pay | Admitting: Family Medicine

## 2012-04-10 ENCOUNTER — Ambulatory Visit (INDEPENDENT_AMBULATORY_CARE_PROVIDER_SITE_OTHER): Payer: Self-pay | Admitting: Family Medicine

## 2012-04-10 DIAGNOSIS — IMO0002 Reserved for concepts with insufficient information to code with codable children: Secondary | ICD-10-CM

## 2012-04-10 DIAGNOSIS — T7491XA Unspecified adult maltreatment, confirmed, initial encounter: Secondary | ICD-10-CM

## 2012-04-10 DIAGNOSIS — R109 Unspecified abdominal pain: Secondary | ICD-10-CM

## 2012-04-10 MED ORDER — HYDROCODONE-ACETAMINOPHEN 5-500 MG PO TABS: 1.0000 | ORAL_TABLET | Freq: Two times a day (BID) | ORAL | Status: DC
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
## 2012-04-11 NOTE — Assessment & Plan Note (Addendum)
No changes on examination of small subcutaneous mass.  No changes in size. Will give vicodin- 2 weeks supply- to get patient through until f/up appointment.  Pt has appt scheduled at [facility] for evaluation of area first week of may. Return sooner if new or worsening of symptoms.

## 2012-04-17 ENCOUNTER — Encounter: Payer: Self-pay | Admitting: Family Medicine

## 2012-04-17 ENCOUNTER — Ambulatory Visit (INDEPENDENT_AMBULATORY_CARE_PROVIDER_SITE_OTHER): Payer: Self-pay | Admitting: Family Medicine

## 2012-04-17 VITALS — BP 138/100 | HR 77 | Temp 98.3°F | Ht 61.0 in | Wt 138.0 lb

## 2012-04-17 DIAGNOSIS — G43909 Migraine, unspecified, not intractable, without status migrainosus: Secondary | ICD-10-CM | POA: Insufficient documentation

## 2012-04-17 DIAGNOSIS — I10 Essential (primary) hypertension: Secondary | ICD-10-CM

## 2012-04-17 MED ORDER — KETOROLAC TROMETHAMINE 30 MG/ML IJ SOLN
30.0000 mg | Freq: Once | INTRAMUSCULAR | Status: AC
  Administered 2012-04-17: 30 mg via INTRAMUSCULAR

## 2012-04-17 MED ORDER — PROPRANOLOL HCL 40 MG PO TABS: 40.0000 mg | ORAL_TABLET | Freq: Two times a day (BID) | ORAL | Status: DC

## 2012-04-17 MED ORDER — PROMETHAZINE HCL 25 MG/ML IJ SOLN
25.0000 mg | Freq: Once | INTRAMUSCULAR | Status: AC
  Administered 2012-04-17: 25 mg via INTRAMUSCULAR

## 2012-04-17 MED ORDER — DEXAMETHASONE SODIUM PHOSPHATE 4 MG/ML IJ SOLN
10.0000 mg | Freq: Once | INTRAMUSCULAR | Status: DC
  Administered 2012-04-17: 10 mg via INTRAMUSCULAR

## 2012-04-17 MED ORDER — DEXAMETHASONE SOD PHOSPHATE PF 10 MG/ML IJ SOLN
10.0000 mg | Freq: Once | INTRAMUSCULAR | Status: AC
  Administered 2012-04-17: 10 mg via INTRAMUSCULAR

## 2012-04-17 NOTE — Patient Instructions (Signed)
Thank you for coming in today, it was good to see you I would like for you to try the propranolol to see if this helps with your migraines and your blood pressure. Please make an appointment with your PCP within the next couple of weeks to follow up your migraines

## 2012-04-20 NOTE — Assessment & Plan Note (Signed)
Diastolic has been persistently above 90 in the past few office visits.  Also getting high readings at home.  Start propranolol for migraine prevention, will likely have a positive effect on BP as well

## 2012-04-20 NOTE — Progress Notes (Signed)
  Subjective:    Patient ID: [name], female    DOB: 1981-09-12, 31 y.o.   MRN: [id number]  HPI 1. Headache:  Patient here with complaint of headache.  States that she began having headache last night and has been gradually worsening today.  States she has had migraines for years.  She has been using excedrin with no relief.  The pain is located on the L side of her head and she does have sensitivity to light along with nausea.  She states she vomited on the ride to the clinic.  She has never been on a preventative medicine  2.  Elevated blood pressure:  Concerned about elevated blood pressure.  Has been elevated in clinic the past few times and elevated to 140's/100 when she checks at [employer].  Thinks this may be contributing to her headaches.  She denies chest pain, weakness or change in sensation, palpitations.     Review of Systems Per HPI    Objective:   Physical Exam  Constitutional: She is oriented to person, place, and time. She appears well-nourished.       In darkened room on laying on exam table with sheet over her head, appears moderately uncomfortable   HENT:  Head: Normocephalic and atraumatic.  Mouth/Throat: Oropharynx is clear and moist.  Eyes: Conjunctivae and EOM are normal. Pupils are equal, round, and reactive to light.  Neck: Normal range of motion. No thyromegaly present.  Cardiovascular: Normal rate, regular rhythm and normal heart sounds.   Pulmonary/Chest: Effort normal and breath sounds normal.  Musculoskeletal: She exhibits no edema.  Neurological: She is alert and oriented to person, place, and time. No cranial nerve deficit. Coordination normal.  Psychiatric: She has a normal mood and affect.          Assessment & Plan:

## 2012-04-20 NOTE — Assessment & Plan Note (Signed)
Headache sounds typical of migraine.  Will treat acutely with toradol/decadron/phenergan.  Will also start propranolol for prevention, as well as blood pressure control

## 2012-04-23 ENCOUNTER — Ambulatory Visit (INDEPENDENT_AMBULATORY_CARE_PROVIDER_SITE_OTHER): Payer: Self-pay | Admitting: Family Medicine

## 2012-04-23 ENCOUNTER — Encounter: Payer: Self-pay | Admitting: Family Medicine

## 2012-04-23 DIAGNOSIS — R1031 Right lower quadrant pain: Secondary | ICD-10-CM

## 2012-04-23 DIAGNOSIS — G8929 Other chronic pain: Secondary | ICD-10-CM | POA: Insufficient documentation

## 2012-04-23 DIAGNOSIS — T7491XA Unspecified adult maltreatment, confirmed, initial encounter: Secondary | ICD-10-CM

## 2012-04-23 DIAGNOSIS — H538 Other visual disturbances: Secondary | ICD-10-CM | POA: Insufficient documentation

## 2012-04-23 DIAGNOSIS — IMO0002 Reserved for concepts with insufficient information to code with codable children: Secondary | ICD-10-CM

## 2012-04-23 MED ORDER — HYDROCODONE-ACETAMINOPHEN 5-500 MG PO TABS: 1.0000 | ORAL_TABLET | Freq: Two times a day (BID) | ORAL | Status: DC
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
## 2012-04-23 NOTE — Assessment & Plan Note (Addendum)
After assesing the patient's complaints and physical findings, the blurry vision may be related to her migraine.  Symptoms now resolved and CN exam wnl.  Although she has a history of migraines, she stated that this was the first time she's ever experienced blurry vision.  She was advised to continue monitoring any changes in vision and return for recheck if any return of vision problems.
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
## 2012-04-23 NOTE — Assessment & Plan Note (Addendum)
Patient referred to [facility] for a small mass in the rt lower quadrant of her abdomen.  The mass remains the same size but continues to give her pain.  She will receive a refill on her pain medication and see her OBGYN next week for treatment.  Pt states that she "just wants it cut out"... I told her that she will need to discuss her concerns with the surgeon.

## 2012-04-30 ENCOUNTER — Encounter: Payer: Self-pay | Admitting: Obstetrics & Gynecology

## 2012-05-01 ENCOUNTER — Ambulatory Visit (INDEPENDENT_AMBULATORY_CARE_PROVIDER_SITE_OTHER): Payer: Self-pay | Admitting: Obstetrics & Gynecology

## 2012-05-01 ENCOUNTER — Encounter: Payer: Self-pay | Admitting: Obstetrics & Gynecology

## 2012-05-01 VITALS — BP 120/87 | HR 86 | Temp 98.2°F | Ht 61.0 in | Wt 142.8 lb

## 2012-05-01 DIAGNOSIS — R1900 Intra-abdominal and pelvic swelling, mass and lump, unspecified site: Secondary | ICD-10-CM

## 2012-05-01 DIAGNOSIS — R222 Localized swelling, mass and lump, trunk: Secondary | ICD-10-CM

## 2012-05-01 NOTE — Patient Instructions (Signed)

## 2012-05-01 NOTE — Progress Notes (Signed)
Subjective:    Patient ID: [name], female    DOB: 1981/04/06, 31 y.o.   MRN: [id number]  [id number] No LMP recorded. Patient has had a hysterectomy. Patient was referred by Dr. Ellin Mayhew due to a tender right lower quadrant nodule that measured 2 cm on CT scan. Says that this has been growing over the last 2 years since she had a abdominal hysterectomy and 2009. She had a diagnosis of endometriosis but the pathology report from her hysterectomy does not confirmed endometriosis. Endometriosis was suspected on the CT report. The site is tender. She tries to wear loose clothing so as to not worsen the pain. Says she also has bloating and diarrhea. She has been a patient of Dr. Freida Busman for excision of hidadrenitis.  Past Medical History  Diagnosis Date  . Anemia   . ADHD (attention deficit hyperactivity disorder)   . Back pain   . Scoliosis   . Leukemia at age 65  . Headache   . Endometriosis   . Hydradenitis   . S/P total abdominal hysterectomy 06/26/2011  . Thyroid disease   . MRSA (methicillin resistant staph aureus) culture positive    Past Surgical History  Procedure Date  . Abdominal hysterectomy   . Laparoscopic endometriosis fulguration   . Cesarean section    Current Outpatient Prescriptions on File Prior to Visit  Medication Sig Dispense Refill  . aspirin-acetaminophen-caffeine (EXCEDRIN MIGRAINE) 250-250-65 MG per tablet Take 1 tablet by mouth every 6 (six) hours as needed. For pain      . doxycycline (MONODOX) 100 MG capsule Take 1 capsule (100 mg total) by mouth 2 (two) times daily.  14 capsule  0  . HYDROcodone-acetaminophen (VICODIN) 5-500 MG per tablet Take 1-2 tablets by mouth 2 (two) times daily.  60 tablet  0  . propranolol (INDERAL) 40 MG tablet Take 1 tablet (40 mg total) by mouth 2 (two) times daily.  60 tablet  1  . naproxen (NAPROSYN) 500 MG tablet Take 1 tablet (500 mg total) by mouth 2 (two) times daily.  30 tablet  0   Allergies  Allergen Reactions   . Oxycodone-Acetaminophen Itching  . Sulfonamide Derivatives Hives and Other (See Comments)    High fever.   History   Social History  . Marital Status: Married    Spouse Name: N/A    Number of Children: N/A  . Years of Education: N/A   Occupational History  . Not on file.   Social History Main Topics  . Smoking status: Current Everyday Smoker -- 1.0 packs/day for 11 years    Types: Cigarettes  . Smokeless tobacco: Never Used   Comment: too much stress  . Alcohol Use: No  . Drug Use: No  . Sexually Active: Not on file   Other Topics Concern  . 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  Plans to apply 2/13.     Family History  Problem Relation Age of Onset  . Heart disease Mother   . Diabetes Mother   . Hypertension Mother   . Heart disease Father   . Diabetes Father   . Hypertension Father   . Bipolar disorder Sister   . Bipolar disorder Brother   . Pancreatic cancer Maternal Grandmother      Review of Systems Right lower quadrant pain bloating and diarrhea. Palpable mass in the right lower quadrant abdominal wall. She feels that it is about  the size of a golf ball    Objective:   Physical Exam  Filed Vitals:   05/01/12 1327  BP: 120/87  Pulse: 86  Temp: 98.2 F (36.8 C)  Height: 5\' 1"  (1.549 m)  Weight: 142 lb 12.8 oz (64.774 kg)    No acute distress normal affect Abdomen: Nondistended, firm tender nodule in the abdominal wall the right lower quadrant approximately 1-1/2-2 cm. No defect consistent with a hernia is palpated. Pelvic was deferred  *RADIOLOGY REPORT*  Clinical Data: Right lower quadrant pain and palpable abnormality.  Previous hysterectomy.  CT ABDOMEN AND PELVIS WITH CONTRAST  Technique: Multidetector CT imaging of the abdomen and pelvis was  performed following the standard protocol during bolus  administration of intravenous contrast.  Contrast: 100 ml Omnipaque-300 and oral  contrast  Comparison: 07/03/2008  Findings: The abdominal parenchymal organs are normal appearance  except for a simple cyst in the upper pole of the right kidney  measuring 3.7 cm which is decreased in size since previous study.  Gallbladder is unremarkable. No evidence of hydronephrosis. No  abdominal soft tissue masses or lymphadenopathy identified.  Previous hysterectomy noted. A small cyst is seen in the right  adnexa measuring 2.4 cm, likely representing a physiologic cyst in  a reproductive age female. In addition, there is a solid enhancing  mass seen in the subcutaneous tissues of the right lower quadrant  anterior abdominal wall which measures 1.7 x 2.0 cm. This is  suspicious for endometriosis, at site of previous surgical  incision. No evidence of abdominal wall hernia.  No inflammatory process or lymphadenopathy identified within the  abdomen or pelvis. No evidence of abnormal fluid collections. No  evidence of bowel wall thickening or dilatation.  IMPRESSION:  1. 2 cm enhancing mass in the deep subcutaneous tissues of the  right lower quadrant anterior abdominal wall, suspicious for  endometriosis at site of previous surgical incision. Consideration  of biopsy or surgical removal is recommended.  2. 2.4 cm right adnexal cyst, most likely representing a  physiologic ovarian cyst in a reproductive age female.  Original Report Authenticated By: Danae Orleans, M.D.       Assessment & Plan:  2 cm mass in the subcutaneous tissues the right lower quadrant abdominal wall which may be involved in the previous surgical site. This this with the patient and recommend that this be evaluated by general surgeon. She would like to be referred back to Dr. Freida Busman.  [name] 05/01/2012 2:09 PM

## 2012-05-01 NOTE — Progress Notes (Signed)
Patient referred to [facility] with Dr. Carolynne Edouard ( Dr. Freida Busman no longer with them) on May 28th, 2013 at 11AM. Patient notified and agrees.

## 2012-05-06 ENCOUNTER — Encounter: Payer: Self-pay | Admitting: Family Medicine

## 2012-05-06 ENCOUNTER — Ambulatory Visit (INDEPENDENT_AMBULATORY_CARE_PROVIDER_SITE_OTHER): Payer: Self-pay | Admitting: Family Medicine

## 2012-05-06 VITALS — BP 100/62 | HR 76 | Temp 98.0°F | Ht 61.0 in | Wt 141.0 lb

## 2012-05-06 DIAGNOSIS — R05 Cough: Secondary | ICD-10-CM

## 2012-05-06 DIAGNOSIS — F17200 Nicotine dependence, unspecified, uncomplicated: Secondary | ICD-10-CM

## 2012-05-06 DIAGNOSIS — R222 Localized swelling, mass and lump, trunk: Secondary | ICD-10-CM

## 2012-05-06 DIAGNOSIS — R059 Cough, unspecified: Secondary | ICD-10-CM

## 2012-05-06 DIAGNOSIS — T7491XA Unspecified adult maltreatment, confirmed, initial encounter: Secondary | ICD-10-CM

## 2012-05-06 DIAGNOSIS — R1900 Intra-abdominal and pelvic swelling, mass and lump, unspecified site: Secondary | ICD-10-CM

## 2012-05-06 DIAGNOSIS — IMO0002 Reserved for concepts with insufficient information to code with codable children: Secondary | ICD-10-CM

## 2012-05-06 MED ORDER — HYDROCODONE-ACETAMINOPHEN 5-500 MG PO TABS: 1.0000 | ORAL_TABLET | Freq: Two times a day (BID) | ORAL | Status: DC

## 2012-05-06 MED ORDER — ALBUTEROL SULFATE HFA 108 (90 BASE) MCG/ACT IN AERS: 2.0000 | INHALATION_SPRAY | Freq: Four times a day (QID) | RESPIRATORY_TRACT | Status: DC | PRN

## 2012-05-06 NOTE — Assessment & Plan Note (Signed)
Encouraged smoking cessation.  Pt states that she is not ready to quit at this time.
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
## 2012-05-06 NOTE — Assessment & Plan Note (Signed)
Pt to f/up with surgery on May 28th as scheduled for evaluation and their recommendation.  Will continue to prescribe vicodin until appt with surgery for discomfort in this area.  Pt is aware that this will not be prescribed long term.
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
## 2012-05-06 NOTE — Assessment & Plan Note (Signed)
Most likely viral cause in the setting of tobacco abuse.  Will prescribe albuterol for any worsening cough, sob, or wheezing that may occur since pt is a smoker. None of these identified on exam today.  Pt to continue to take mucinex prn.  Return if no improvement or if new or worsening of symptoms.
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
## 2012-05-20 ENCOUNTER — Telehealth: Payer: Self-pay | Admitting: Family Medicine

## 2012-05-20 NOTE — Telephone Encounter (Signed)
Patient is calling for a refill on her Hydrocodone 500 mg.  Please call when ready for pick up.

## 2012-05-20 NOTE — Telephone Encounter (Signed)
Returned call to patient.  She is out of meds and does not have an appt with surgeon until 05/27/12.  Counseled patient on calling for refills 24-48 hours before she runs out of med.  Will page Dr. Edmonia James and call patient back.  Gaylene Brooks, RN

## 2012-05-20 NOTE — Telephone Encounter (Signed)
Patient is calling back to check on pain medication.

## 2012-05-20 NOTE — Telephone Encounter (Signed)
Patient is calling back again.

## 2012-05-21 ENCOUNTER — Other Ambulatory Visit: Payer: Self-pay | Admitting: Family Medicine

## 2012-05-21 ENCOUNTER — Encounter: Payer: Self-pay | Admitting: *Deleted

## 2012-05-21 MED ORDER — HYDROCODONE-ACETAMINOPHEN 5-500 MG PO TABS: 1.0000 | ORAL_TABLET | Freq: Two times a day (BID) | ORAL | Status: DC

## 2012-05-21 NOTE — Telephone Encounter (Signed)
Patient called to see if the Rx has been sent yet.  I did not see it, but Molly Maduro will check with Dr. Edmonia James when she comes out of the exam room.

## 2012-05-21 NOTE — Telephone Encounter (Signed)
Faxed Rx to pharmacy. Called and notified patient.[name], [name]

## 2012-05-21 NOTE — Telephone Encounter (Signed)
Will fax refill to [facility]- rx refill complete for 7 day supply of vicodin to get pt through until surgery appt/evaluation.  rx placed in the "to be faxed" pile in front office.

## 2012-05-27 ENCOUNTER — Encounter (INDEPENDENT_AMBULATORY_CARE_PROVIDER_SITE_OTHER): Payer: Self-pay | Admitting: General Surgery

## 2012-05-27 ENCOUNTER — Ambulatory Visit (INDEPENDENT_AMBULATORY_CARE_PROVIDER_SITE_OTHER): Payer: PRIVATE HEALTH INSURANCE | Admitting: General Surgery

## 2012-05-27 VITALS — Ht 61.0 in | Wt 141.5 lb

## 2012-05-27 DIAGNOSIS — R1900 Intra-abdominal and pelvic swelling, mass and lump, unspecified site: Secondary | ICD-10-CM

## 2012-05-27 DIAGNOSIS — R222 Localized swelling, mass and lump, trunk: Secondary | ICD-10-CM

## 2012-05-27 NOTE — Progress Notes (Signed)
Subjective:     Patient ID: [name], female   DOB: 01/09/1981, 30 y.o.   MRN: [id number]  HPI We are asked to see the patient in consultation by Dr. Caviness to evaluate her for a mass of her abdominal wall. The patient is a 30-year-old white female who has been experiencing pain and burning in her right lower quadrant for the last 6 months. She has been able to feel a mass in this area. She has also experienced some nausea and diarrhea. She denies any fevers or chills.  Review of Systems  Constitutional: Negative.   HENT: Negative.   Eyes: Negative.   Respiratory: Negative.   Cardiovascular: Negative.   Gastrointestinal: Positive for nausea and diarrhea.  Genitourinary: Positive for vaginal pain.  Musculoskeletal: Negative.   Skin: Negative.   Neurological: Negative.   Hematological: Negative.   Psychiatric/Behavioral: Negative.        Objective:   Physical Exam  Constitutional: She is oriented to person, place, and time. She appears well-developed and well-nourished.  HENT:  Head: Normocephalic and atraumatic.  Eyes: Conjunctivae and EOM are normal. Pupils are equal, round, and reactive to light.  Neck: Normal range of motion. Neck supple.  Cardiovascular: Normal rate, regular rhythm and normal heart sounds.   Pulmonary/Chest: Effort normal and breath sounds normal.  Abdominal: Soft. Bowel sounds are normal.       There is a small palpable mass about 2 cm in diameter in the right lower quadrant subcutaneous tissue. No overlying skin changes. The mass is tender.  Musculoskeletal: Normal range of motion.  Neurological: She is alert and oriented to person, place, and time.  Skin: Skin is warm and dry.  Psychiatric: She has a normal mood and affect. Her behavior is normal.       Assessment:     The patient is a small mass in the right lower quadrant subcutaneous tissue. It is not clear what the mass he is but it seems to be isolated in the subcutaneous tissue and  sitting on the rectus muscle. Since the mass is causing pain and should not be there I think it would be reasonable to remove it. I think this could be done in an outpatient setting since we should not have to enter the abdominal cavity. I discussed her in detail the risks and benefits of the operation remove this mass as well as some of the technical aspects and she understands and wishes to proceed.    Plan:     Plan for excision of mass in the subcutaneous tissue of the right lower quadrant abdominal wall.      

## 2012-05-29 ENCOUNTER — Encounter: Payer: Self-pay | Admitting: Family Medicine

## 2012-05-29 ENCOUNTER — Ambulatory Visit (INDEPENDENT_AMBULATORY_CARE_PROVIDER_SITE_OTHER): Payer: Self-pay | Admitting: Family Medicine

## 2012-05-29 VITALS — BP 127/79 | HR 73 | Ht 61.0 in | Wt 141.0 lb

## 2012-05-29 DIAGNOSIS — R222 Localized swelling, mass and lump, trunk: Secondary | ICD-10-CM

## 2012-05-29 DIAGNOSIS — R1900 Intra-abdominal and pelvic swelling, mass and lump, unspecified site: Secondary | ICD-10-CM

## 2012-05-29 MED ORDER — HYDROCODONE-ACETAMINOPHEN 5-500 MG PO TABS: 1.0000 | ORAL_TABLET | Freq: Four times a day (QID) | ORAL | Status: AC | PRN
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
## 2012-05-29 NOTE — Patient Instructions (Signed)
F/up with Dr. Carolynne Edouard for surgical intervention.  Dr. Carolynne Edouard will manage pain meds if any are needed post-op.   Return as needed.   I want you to return to see me after your surgery for f/up.

## 2012-05-30 ENCOUNTER — Other Ambulatory Visit (INDEPENDENT_AMBULATORY_CARE_PROVIDER_SITE_OTHER): Payer: Self-pay | Admitting: General Surgery

## 2012-06-02 NOTE — Assessment & Plan Note (Signed)
Will give refill of vicodin to get patient through to surgery-  Pt to have surgery done on June 20th by Dr. Carolynne Edouard.  Hopefully this will relieve pain and no further pain meds will be needed. Pt states that vicodin is controlling pain well and that it improves her ability to function.  Pt to return post/op for f/up.

## 2012-06-13 ENCOUNTER — Encounter (HOSPITAL_BASED_OUTPATIENT_CLINIC_OR_DEPARTMENT_OTHER): Payer: Self-pay | Admitting: *Deleted

## 2012-06-17 ENCOUNTER — Encounter (HOSPITAL_BASED_OUTPATIENT_CLINIC_OR_DEPARTMENT_OTHER)
Admission: RE | Admit: 2012-06-17 | Discharge: 2012-06-17 | Disposition: A | Discharge status: Home / Self Care | Payer: Self-pay | Source: Ambulatory Visit | Attending: Orthopedic Surgery | Admitting: Orthopedic Surgery

## 2012-06-17 LAB — BASIC METABOLIC PANEL
BUN: 10 mg/dL (ref 6–23)
GFR calc non Af Amer: >90 mL/min (ref >90)
Glucose, Bld: 92 mg/dL (ref 70–99)
Potassium: 4.3 mEq/L (ref 3.5–5.1)

## 2012-06-18 ENCOUNTER — Encounter: Payer: Self-pay | Admitting: Family Medicine

## 2012-06-18 ENCOUNTER — Other Ambulatory Visit (HOSPITAL_COMMUNITY)
Admission: RE | Admit: 2012-06-18 | Discharge: 2012-06-18 | Disposition: A | Discharge status: Home / Self Care | Payer: Self-pay | Source: Ambulatory Visit | Attending: Family Medicine | Admitting: Family Medicine

## 2012-06-18 ENCOUNTER — Ambulatory Visit (INDEPENDENT_AMBULATORY_CARE_PROVIDER_SITE_OTHER): Payer: Self-pay | Admitting: Family Medicine

## 2012-06-18 VITALS — BP 147/100 | HR 67 | Temp 98.4°F | Ht 61.0 in | Wt 144.0 lb

## 2012-06-18 DIAGNOSIS — Z113 Encounter for screening for infections with a predominantly sexual mode of transmission: Secondary | ICD-10-CM | POA: Insufficient documentation

## 2012-06-18 DIAGNOSIS — N760 Acute vaginitis: Secondary | ICD-10-CM

## 2012-06-18 DIAGNOSIS — Z20828 Contact with and (suspected) exposure to other viral communicable diseases: Secondary | ICD-10-CM

## 2012-06-18 LAB — POCT WET PREP (WET MOUNT)
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
## 2012-06-18 NOTE — Patient Instructions (Addendum)
I have tested you for infections today I will call if anything is abnormal or send a letter if everything is fine  Please followup with your regular doctor to discuss what to do about your pain

## 2012-06-18 NOTE — Progress Notes (Signed)
  Subjective:    Patient ID: [name], female    DOB: 02-08-1981, 31 y.o.   MRN: [id number]  HPI  Patient here today for STD checks. She states that she has some vaginal discharge x1 week. This is thin with some itching. She is concerned about STDs because her husband has been unfaithful multiple times. She says that in order to even the score, she was also unfaithful. She says that she use condoms with her second partner but does not think her husband is using condoms with his other partners. She says that she will use condoms in the future with her husband. She does not want to leave her husband at this time.  Review of Systems No nausea, vomiting, diarrhea, abdominal pain    Objective:   Physical Exam Vital signs reviewed General appearance - alert, well appearing, and in no distress GYN- external genetalia with small cystic lesion left labia.  Vagina normal color, rugations, without discharge.  No cervix present.        Assessment & Plan:

## 2012-06-19 ENCOUNTER — Ambulatory Visit (HOSPITAL_BASED_OUTPATIENT_CLINIC_OR_DEPARTMENT_OTHER): Payer: Self-pay | Admitting: *Deleted

## 2012-06-19 ENCOUNTER — Encounter (HOSPITAL_BASED_OUTPATIENT_CLINIC_OR_DEPARTMENT_OTHER)
Admission: RE | Disposition: A | Discharge status: Home / Self Care | Payer: Self-pay | Source: Ambulatory Visit | Attending: General Surgery

## 2012-06-19 ENCOUNTER — Encounter (HOSPITAL_BASED_OUTPATIENT_CLINIC_OR_DEPARTMENT_OTHER): Payer: Self-pay | Admitting: *Deleted

## 2012-06-19 ENCOUNTER — Ambulatory Visit (HOSPITAL_BASED_OUTPATIENT_CLINIC_OR_DEPARTMENT_OTHER)
Admission: RE | Admit: 2012-06-19 | Discharge: 2012-06-19 | Disposition: A | Discharge status: Home / Self Care | Payer: Self-pay | Source: Ambulatory Visit | Attending: General Surgery | Admitting: General Surgery

## 2012-06-19 DIAGNOSIS — N806 Endometriosis in cutaneous scar: Secondary | ICD-10-CM

## 2012-06-19 DIAGNOSIS — N803 Endometriosis of pelvic peritoneum, unspecified: Secondary | ICD-10-CM | POA: Insufficient documentation

## 2012-06-19 DIAGNOSIS — R222 Localized swelling, mass and lump, trunk: Secondary | ICD-10-CM

## 2012-06-19 DIAGNOSIS — Z01812 Encounter for preprocedural laboratory examination: Secondary | ICD-10-CM | POA: Insufficient documentation

## 2012-06-19 HISTORY — DX: Major depressive disorder, single episode, unspecified: F32.9

## 2012-06-19 HISTORY — DX: Depression, unspecified: F32.A

## 2012-06-19 HISTORY — DX: Unspecified asthma, uncomplicated: J45.909

## 2012-06-19 HISTORY — PX: MASS EXCISION: SHX2000

## 2012-06-19 SURGERY — EXCISION MASS
Anesthesia: General | Site: Abdomen | Laterality: Right | Wound class: Clean

## 2012-06-19 MED ORDER — FENTANYL CITRATE 0.05 MG/ML IJ SOLN
INTRAMUSCULAR | Status: DC | PRN
  Administered 2012-06-19 (×2): 50 ug via INTRAVENOUS

## 2012-06-19 MED ORDER — DEXAMETHASONE SODIUM PHOSPHATE 4 MG/ML IJ SOLN
INTRAMUSCULAR | Status: DC | PRN
  Administered 2012-06-19: 10 mg via INTRAVENOUS

## 2012-06-19 MED ORDER — CEFAZOLIN SODIUM 1-5 GM-% IV SOLN
1.0000 g | INTRAVENOUS | Status: AC
  Administered 2012-06-19: 1 g via INTRAVENOUS

## 2012-06-19 MED ORDER — LIDOCAINE HCL (CARDIAC) 20 MG/ML IV SOLN
INTRAVENOUS | Status: DC | PRN
  Administered 2012-06-19: 50 mg via INTRAVENOUS

## 2012-06-19 MED ORDER — BUPIVACAINE-EPINEPHRINE 0.25% -1:200000 IJ SOLN
INTRAMUSCULAR | Status: DC | PRN
  Administered 2012-06-19: 20 mL

## 2012-06-19 MED ORDER — HYDROCODONE-ACETAMINOPHEN 5-325 MG PO TABS: 1.0000 | ORAL_TABLET | ORAL | Status: AC | PRN

## 2012-06-19 MED ORDER — CHLORHEXIDINE GLUCONATE 4 % EX LIQD: 1.0000 "application " | Freq: Once | CUTANEOUS | Status: DC

## 2012-06-19 MED ORDER — FENTANYL CITRATE 0.05 MG/ML IJ SOLN
25.0000 ug | INTRAMUSCULAR | Status: DC | PRN
  Administered 2012-06-19: 25 ug via INTRAVENOUS

## 2012-06-19 MED ORDER — ONDANSETRON HCL 4 MG/2ML IJ SOLN: 4.0000 mg | Freq: Four times a day (QID) | INTRAMUSCULAR | Status: DC | PRN

## 2012-06-19 MED ORDER — MIDAZOLAM HCL 5 MG/5ML IJ SOLN
INTRAMUSCULAR | Status: DC | PRN
  Administered 2012-06-19: 2 mg via INTRAVENOUS

## 2012-06-19 MED ORDER — ONDANSETRON HCL 4 MG/2ML IJ SOLN
INTRAMUSCULAR | Status: DC | PRN
  Administered 2012-06-19: 4 mg via INTRAVENOUS

## 2012-06-19 MED ORDER — PROPOFOL 10 MG/ML IV EMUL
INTRAVENOUS | Status: DC | PRN
  Administered 2012-06-19: 200 mg via INTRAVENOUS

## 2012-06-19 MED ORDER — LACTATED RINGERS IV SOLN
INTRAVENOUS | Status: DC
  Administered 2012-06-19 (×2): via INTRAVENOUS

## 2012-06-19 SURGICAL SUPPLY — 50 items
ADH SKN CLS APL DERMABOND .7 (GAUZE/BANDAGES/DRESSINGS) ×1
BLADE SURG 10 STRL SS (BLADE) ×2 IMPLANT
BLADE SURG 15 STRL LF DISP TIS (BLADE) ×1 IMPLANT
BLADE SURG 15 STRL SS (BLADE) ×2
CANISTER SUCTION 1200CC (MISCELLANEOUS) ×1 IMPLANT
CHLORAPREP W/TINT 26ML (MISCELLANEOUS) ×2 IMPLANT
CLOTH BEACON ORANGE TIMEOUT ST (SAFETY) ×2 IMPLANT
COVER MAYO STAND STRL (DRAPES) ×2 IMPLANT
COVER TABLE BACK 60X90 (DRAPES) ×2 IMPLANT
DECANTER SPIKE VIAL GLASS SM (MISCELLANEOUS) IMPLANT
DERMABOND ADVANCED (GAUZE/BANDAGES/DRESSINGS) ×1
DERMABOND ADVANCED .7 DNX12 (GAUZE/BANDAGES/DRESSINGS) ×1 IMPLANT
DRAPE PED LAPAROTOMY (DRAPES) ×2 IMPLANT
DRAPE UTILITY XL STRL (DRAPES) ×2 IMPLANT
ELECT COATED BLADE 2.86 ST (ELECTRODE) ×2 IMPLANT
ELECT REM PT RETURN 9FT ADLT (ELECTROSURGICAL) ×2
ELECTRODE REM PT RTRN 9FT ADLT (ELECTROSURGICAL) ×1 IMPLANT
GAUZE SPONGE 4X4 16PLY XRAY LF (GAUZE/BANDAGES/DRESSINGS) IMPLANT
GLOVE BIO SURGEON STRL SZ7.5 (GLOVE) ×2 IMPLANT
GLOVE SKINSENSE NS SZ7.0 (GLOVE) ×1
GLOVE SKINSENSE STRL SZ7.0 (GLOVE) IMPLANT
GOWN PREVENTION PLUS XLARGE (GOWN DISPOSABLE) ×3 IMPLANT
NDL HYPO 25X1 1.5 SAFETY (NEEDLE) IMPLANT
NEEDLE HYPO 25X1 1.5 SAFETY (NEEDLE) ×2 IMPLANT
NS IRRIG 1000ML POUR BTL (IV SOLUTION) ×2 IMPLANT
PACK BASIN DAY SURGERY FS (CUSTOM PROCEDURE TRAY) ×2 IMPLANT
PENCIL BUTTON HOLSTER BLD 10FT (ELECTRODE) ×2 IMPLANT
SLEEVE SCD COMPRESS KNEE MED (MISCELLANEOUS) ×2 IMPLANT
SPONGE LAP 18X18 X RAY DECT (DISPOSABLE) ×2 IMPLANT
SUT CHROMIC 3 0 SH 27 (SUTURE) IMPLANT
SUT ETHILON 3 0 PS 1 (SUTURE) IMPLANT
SUT MON AB 4-0 PC3 18 (SUTURE) ×1 IMPLANT
SUT PROLENE 3 0 PS 2 (SUTURE) IMPLANT
SUT SILK 2 0 FS (SUTURE) IMPLANT
SUT VIC AB 2-0 SH 18 (SUTURE) ×1 IMPLANT
SUT VIC AB 3-0 54X BRD REEL (SUTURE) IMPLANT
SUT VIC AB 3-0 BRD 54 (SUTURE)
SUT VIC AB 3-0 FS2 27 (SUTURE) IMPLANT
SUT VIC AB 3-0 SH 27 (SUTURE)
SUT VIC AB 3-0 SH 27X BRD (SUTURE) IMPLANT
SUT VIC AB 4-0 RB1 27 (SUTURE)
SUT VIC AB 4-0 RB1 27X BRD (SUTURE) IMPLANT
SUT VICRYL 4-0 PS2 18IN ABS (SUTURE) IMPLANT
SUT VICRYL AB 3 0 TIES (SUTURE) IMPLANT
SYR CONTROL 10ML LL (SYRINGE) ×1 IMPLANT
TOWEL OR 17X24 6PK STRL BLUE (TOWEL DISPOSABLE) ×3 IMPLANT
TOWEL OR NON WOVEN STRL DISP B (DISPOSABLE) ×2 IMPLANT
TUBE CONNECTING 20X1/4 (TUBING) ×1 IMPLANT
WATER STERILE IRR 1000ML POUR (IV SOLUTION) ×2 IMPLANT
YANKAUER SUCT BULB TIP NO VENT (SUCTIONS) ×1 IMPLANT

## 2012-06-19 NOTE — Anesthesia Procedure Notes (Signed)
Procedure Name: LMA Insertion Date/Time: 06/19/2012 11:04 AM Performed by: Zenia Resides D Pre-anesthesia Checklist: Patient identified, Emergency Drugs available, Suction available, Patient being monitored and Timeout performed Patient Re-evaluated:Patient Re-evaluated prior to inductionOxygen Delivery Method: Circle System Utilized Preoxygenation: Pre-oxygenation with 100% oxygen Intubation Type: IV induction Ventilation: Mask ventilation without difficulty LMA: LMA inserted LMA Size: 4.0 Number of attempts: 1 Airway Equipment and Method: bite block Placement Confirmation: positive ETCO2 and breath sounds checked- equal and bilateral Tube secured with: Tape Dental Injury: Teeth and Oropharynx as per pre-operative assessment

## 2012-06-19 NOTE — Op Note (Signed)
06/19/2012  11:43 AM  PATIENT:  [name]  31 y.o. female  PRE-OPERATIVE DIAGNOSIS:  mass right abd wall  POST-OPERATIVE DIAGNOSIS:  mass right abd wall  PROCEDURE:  Procedure(s) (LRB): EXCISION MASS (Right)  SURGEON:  Surgeon(s) and Role:    * Robyne Askew, MD - Primary  PHYSICIAN ASSISTANT:   ASSISTANTS: none   ANESTHESIA:   general  EBL:  Total I/O In: 1100 [I.V.:1100] Out: -   BLOOD ADMINISTERED:none  DRAINS: none   LOCAL MEDICATIONS USED:  MARCAINE     SPECIMEN:  Source of Specimen:  mass of right abd wall  DISPOSITION OF SPECIMEN:  PATHOLOGY  COUNTS:  YES  TOURNIQUET:  * No tourniquets in log *  DICTATION: .Dragon Dictation After informed consent was obtained patient brought to the operating room placed in the supine position on the operating room table. After adequate induction of general anesthesia the patient's abdomen was prepped with ChloraPrep, wet-to-dry, and draped in usual sterile manner. The patient had a palpable mass about 2 cm in diameter in the right lower quadrant of the abdominal wall. The area overlying this mass was infiltrated with quarter percent Marcaine with epinephrine. A small transversely oriented incision was made with a 15 blade knife overlying the palpable mass. The incision was carried through the skin and subcutaneous tissue sharply with electrocautery until the mass was identified. The mass was then excised sharply with the electrocautery. Once the mass was removed it was sent to pathology for further evaluation. Hemostasis was achieved using the Bovie electrocautery. The wound was irrigated with copious amounts of saline. The wound was then infiltrated with 1/4% Marcaine. The deep layer the wound was closed with interrupted 2-0 Vicryl stitches. The skin was then closed with interrupted 4-0 Monocryl subcuticular stitches. Dermabond dressings were applied. The patient tolerated the procedure well. At the end of the case on needle  sponge and instrument counts were correct. The patient was then awakened and taken to recovery in stable condition.  PLAN OF CARE: Discharge to home after PACU  PATIENT DISPOSITION:  PACU - hemodynamically stable.   Delay start of Pharmacological VTE agent (>24hrs) due to surgical blood loss or risk of bleeding: not applicable

## 2012-06-19 NOTE — Anesthesia Preprocedure Evaluation (Signed)
Anesthesia Evaluation  Patient identified by MRN, date of birth, ID band Patient awake    Reviewed: Allergy & Precautions, H&P , NPO status , Patient's Chart, lab work & pertinent test results  Airway Mallampati: II  Neck ROM: full    Dental   Pulmonary asthma , Current Smoker,          Cardiovascular hypertension,     Neuro/Psych  Headaches,    GI/Hepatic GERD-  ,  Endo/Other    Renal/GU      Musculoskeletal   Abdominal   Peds  Hematology   Anesthesia Other Findings   Reproductive/Obstetrics                           Anesthesia Physical Anesthesia Plan  ASA: II  Anesthesia Plan: General   Post-op Pain Management:    Induction: Intravenous  Airway Management Planned: LMA  Additional Equipment:   Intra-op Plan:   Post-operative Plan:   Informed Consent: I have reviewed the patients History and Physical, chart, labs and discussed the procedure including the risks, benefits and alternatives for the proposed anesthesia with the patient or authorized representative who has indicated his/her understanding and acceptance.     Plan Discussed with: CRNA and Surgeon  Anesthesia Plan Comments:         Anesthesia Quick Evaluation

## 2012-06-19 NOTE — Transfer of Care (Signed)
Immediate Anesthesia Transfer of Care Note  Patient: [name]  Procedure(s) Performed: Procedure(s) (LRB): EXCISION MASS (Right)  Patient Location: PACU  Anesthesia Type: General  Level of Consciousness: awake, alert  and oriented  Airway & Oxygen Therapy: Patient Spontanous Breathing and Patient connected to face mask oxygen  Post-op Assessment: Report given to PACU RN and Post -op Vital signs reviewed and stable  Post vital signs: Reviewed and stable  Complications: No apparent anesthesia complications

## 2012-06-19 NOTE — H&P (View-Only) (Signed)
Subjective:     Patient ID: [name], female   DOB: 10/25/1981, 31 y.o.   MRN: [id number]  HPI We are asked to see the patient in consultation by Dr. Edmonia James to evaluate her for a mass of her abdominal wall. The patient is a 31 year old white female who has been experiencing pain and burning in her right lower quadrant for the last 6 months. She has been able to feel a mass in this area. She has also experienced some nausea and diarrhea. She denies any fevers or chills.  Review of Systems  Constitutional: Negative.   HENT: Negative.   Eyes: Negative.   Respiratory: Negative.   Cardiovascular: Negative.   Gastrointestinal: Positive for nausea and diarrhea.  Genitourinary: Positive for vaginal pain.  Musculoskeletal: Negative.   Skin: Negative.   Neurological: Negative.   Hematological: Negative.   Psychiatric/Behavioral: Negative.        Objective:   Physical Exam  Constitutional: She is oriented to person, place, and time. She appears well-developed and well-nourished.  HENT:  Head: Normocephalic and atraumatic.  Eyes: Conjunctivae and EOM are normal. Pupils are equal, round, and reactive to light.  Neck: Normal range of motion. Neck supple.  Cardiovascular: Normal rate, regular rhythm and normal heart sounds.   Pulmonary/Chest: Effort normal and breath sounds normal.  Abdominal: Soft. Bowel sounds are normal.       There is a small palpable mass about 2 cm in diameter in the right lower quadrant subcutaneous tissue. No overlying skin changes. The mass is tender.  Musculoskeletal: Normal range of motion.  Neurological: She is alert and oriented to person, place, and time.  Skin: Skin is warm and dry.  Psychiatric: She has a normal mood and affect. Her behavior is normal.       Assessment:     The patient is a small mass in the right lower quadrant subcutaneous tissue. It is not clear what the mass he is but it seems to be isolated in the subcutaneous tissue and  sitting on the rectus muscle. Since the mass is causing pain and should not be there I think it would be reasonable to remove it. I think this could be done in an outpatient setting since we should not have to enter the abdominal cavity. I discussed her in detail the risks and benefits of the operation remove this mass as well as some of the technical aspects and she understands and wishes to proceed.    Plan:     Plan for excision of mass in the subcutaneous tissue of the right lower quadrant abdominal wall.

## 2012-06-19 NOTE — Interval H&P Note (Signed)
History and Physical Interval Note:  06/19/2012 9:47 AM  [name]  has presented today for surgery, with the diagnosis of mass right abd wall  The various methods of treatment have been discussed with the patient and family. After consideration of risks, benefits and other options for treatment, the patient has consented to  Procedure(s) (LRB): EXCISION MASS (Right) as a surgical intervention .  The patient's history has been reviewed, patient examined, no change in status, stable for surgery.  I have reviewed the patients' chart and labs.  Questions were answered to the patient's satisfaction.     TOTH III,[name]

## 2012-06-19 NOTE — Discharge Instructions (Signed)

## 2012-06-19 NOTE — Anesthesia Postprocedure Evaluation (Signed)
Anesthesia Post Note  Patient: [name]  Procedure(s) Performed: Procedure(s) (LRB): EXCISION MASS (Right)  Anesthesia type: General  Patient location: PACU  Post pain: Pain level controlled and Adequate analgesia  Post assessment: Post-op Vital signs reviewed, Patient's Cardiovascular Status Stable, Respiratory Function Stable, Patent Airway and Pain level controlled  Last Vitals:  Filed Vitals:   06/19/12 1215  BP: 127/82  Pulse: 72  Temp:   Resp: 17    Post vital signs: Reviewed and stable  Level of consciousness: awake, alert  and oriented  Complications: No apparent anesthesia complications

## 2012-06-20 ENCOUNTER — Encounter: Payer: Self-pay | Admitting: Family Medicine

## 2012-06-20 LAB — POCT HEMOGLOBIN-HEMACUE: Hemoglobin: 14.8 g/dL (ref 12.0–15.0)

## 2012-06-23 ENCOUNTER — Telehealth (INDEPENDENT_AMBULATORY_CARE_PROVIDER_SITE_OTHER): Payer: Self-pay | Admitting: General Surgery

## 2012-06-23 ENCOUNTER — Encounter (HOSPITAL_BASED_OUTPATIENT_CLINIC_OR_DEPARTMENT_OTHER): Payer: Self-pay | Admitting: General Surgery

## 2012-06-23 NOTE — Telephone Encounter (Signed)
Called pt and gave her path over the phone and told her to keep appt with Dr Carolynne Edouard on 07-10-12

## 2012-06-24 ENCOUNTER — Encounter (HOSPITAL_BASED_OUTPATIENT_CLINIC_OR_DEPARTMENT_OTHER): Payer: Self-pay

## 2012-06-27 ENCOUNTER — Encounter: Payer: Self-pay | Admitting: Family Medicine

## 2012-06-27 ENCOUNTER — Ambulatory Visit (INDEPENDENT_AMBULATORY_CARE_PROVIDER_SITE_OTHER): Payer: Self-pay | Admitting: Family Medicine

## 2012-06-27 VITALS — BP 122/80 | HR 70 | Temp 99.5°F | Ht 61.0 in | Wt 141.0 lb

## 2012-06-27 DIAGNOSIS — H6093 Unspecified otitis externa, bilateral: Secondary | ICD-10-CM

## 2012-06-27 DIAGNOSIS — H60399 Other infective otitis externa, unspecified ear: Secondary | ICD-10-CM

## 2012-06-27 NOTE — Patient Instructions (Addendum)
Continue to use antibiotic drops as directed.  Use hydrocortisone cream on external ear canal to decrease inflammation 2 x per day.    Return in 1week for recheck

## 2012-06-27 NOTE — Progress Notes (Signed)
  Subjective:    Patient ID: [name], female    DOB: 03-02-1981, 31 y.o.   MRN: [id number]  HPI Ear discharge bilateral: Patient reports yellow/green drainage from ears bilateral x2-3 days. Some mild ear pain. Some mild itching. No fever. No headache. States she's been using ear antibiotics at she has had the past. Using drops 2 times a day. Not sure what the name of the medicine is. States no improvement after using the antibiotic drops x2 days to she came here for evaluation. On review of record has had ear infections and external otitis multiple times during the past 2 years. Patient usually responds to regular treatment regimen. Did see in chart in 2010 patient was admitted for MRSA in right ear area. Patient had to be admitted for antibiotics at that time. Patient states that the symptoms are not consistent with the time she was admitted.no problems with hearing.   Smoking status reviewed.   Review of Systems As per above.    Objective:   Physical Exam  Constitutional: She appears well-developed and well-nourished. No distress.  HENT:  Right Ear: External ear normal.  Left Ear: External ear normal.       Yellow thick discharge in ear canal bilateral.  TM appear intact bilateral, but + scarring of TM bilateral.  No bulging or erythema of TM bilateral.   Cardiovascular: Normal rate.   Pulmonary/Chest: Effort normal. No respiratory distress.  Neurological: She is alert.  Skin: No rash noted.  Psychiatric: She has a normal mood and affect.          Assessment & Plan:

## 2012-06-29 DIAGNOSIS — H6093 Unspecified otitis externa, bilateral: Secondary | ICD-10-CM | POA: Insufficient documentation

## 2012-06-29 NOTE — Assessment & Plan Note (Signed)
Pt to continue antibiotic drops as directed with medication that she has at home (pt states she believes that the drops are cipro- and this is the antibiotic that helped her last similar ear infection.  Pt to use these drops consistently x 1 week and return in 1 week if not improved, or sooner if new or worsening of symptoms.

## 2012-07-01 ENCOUNTER — Ambulatory Visit (INDEPENDENT_AMBULATORY_CARE_PROVIDER_SITE_OTHER): Payer: Self-pay | Admitting: Family Medicine

## 2012-07-01 ENCOUNTER — Ambulatory Visit: Payer: Self-pay

## 2012-07-01 ENCOUNTER — Encounter: Payer: Self-pay | Admitting: Family Medicine

## 2012-07-01 VITALS — BP 118/81 | HR 92 | Ht 61.0 in | Wt 141.3 lb

## 2012-07-01 DIAGNOSIS — H6093 Unspecified otitis externa, bilateral: Secondary | ICD-10-CM

## 2012-07-01 DIAGNOSIS — H60399 Other infective otitis externa, unspecified ear: Secondary | ICD-10-CM

## 2012-07-01 MED ORDER — CIPROFLOXACIN-DEXAMETHASONE 0.3-0.1 % OT SUSP: 4.0000 [drp] | Freq: Two times a day (BID) | OTIC | Status: AC

## 2012-07-01 NOTE — Patient Instructions (Addendum)
Take antibiotic drops as prescribed.   We will call you with your ENT referral appointment.

## 2012-07-01 NOTE — Assessment & Plan Note (Addendum)
Rx sent for patient to pharmacy for antibiotic/antiinflammatory drops- cipro/dexamethasone-  Since I feel that this is a chronic issue that did not respond well to first antibiotic drops and pt may have some antibiotic resistance since this is a recurrent issue, plus patient having significant discomfort with significant volume of discharge- will refer to ENT at this time.

## 2012-07-01 NOTE — Progress Notes (Signed)
  Subjective:    Patient ID: [name], female    DOB: September 28, 1981, 31 y.o.   MRN: [id number]  HPI Ear pain bilateral: Patient reports continued ear drainage bilateral. Also having pain in ears bilateral. Did not remember to bring antibiotic drops with her today's appointment- this patient has been using the antibiotic drops daily.   She states she is concerned because it does not seem to be getting better. Pain inside ear canal. Drainage yellow in color. No fever. No changes in hearing. No rash. No cold symptoms. Occasional cold chills.  Smoking status reviewed.   Review of Systems As per above.    Objective:   Physical Exam  Constitutional: She appears well-developed and well-nourished.  Cardiovascular: Normal rate.   Pulmonary/Chest: Effort normal. No respiratory distress.  Neurological: She is alert.  Skin: No rash noted.  Psychiatric: She has a normal mood and affect.  ear exam: Yellow thick discharge in ear canal bilateral. TM appear intact bilateral- but only partial view- posterior portion only, + scarring of TM bilateral. No bulging or erythema of TM bilateral.         Assessment & Plan:

## 2012-07-07 ENCOUNTER — Other Ambulatory Visit (INDEPENDENT_AMBULATORY_CARE_PROVIDER_SITE_OTHER): Payer: Self-pay | Admitting: General Surgery

## 2012-07-07 ENCOUNTER — Telehealth: Payer: Self-pay | Admitting: Family Medicine

## 2012-07-07 ENCOUNTER — Other Ambulatory Visit: Payer: Self-pay | Admitting: *Deleted

## 2012-07-07 DIAGNOSIS — G43909 Migraine, unspecified, not intractable, without status migrainosus: Secondary | ICD-10-CM

## 2012-07-07 MED ORDER — PROPRANOLOL HCL 40 MG PO TABS: 40.0000 mg | ORAL_TABLET | Freq: Two times a day (BID) | ORAL | Status: DC

## 2012-07-07 NOTE — Telephone Encounter (Signed)
Can this pt have this Rx 

## 2012-07-07 NOTE — Telephone Encounter (Signed)
1.) called ENT. Pt needs to pay her bill or make payments and they will schedule OV for her. 2.) called pt. Pt is aware of it and will pay her due of $86 and make an appointment with them. Fwd. To Dr.Caviness for info. Lorenda Hatchet, Renato Battles

## 2012-07-07 NOTE — Telephone Encounter (Signed)
Is asking about referral to ENT - her ears are still draining.

## 2012-07-08 ENCOUNTER — Ambulatory Visit: Payer: Self-pay | Admitting: Family Medicine

## 2012-07-09 ENCOUNTER — Telehealth: Payer: Self-pay | Admitting: Family Medicine

## 2012-07-09 NOTE — Telephone Encounter (Signed)
Pt cannot afford the ear drops and needs something cheaper.  [facility] [employer]

## 2012-07-10 ENCOUNTER — Encounter (INDEPENDENT_AMBULATORY_CARE_PROVIDER_SITE_OTHER): Payer: PRIVATE HEALTH INSURANCE | Admitting: General Surgery

## 2012-07-17 ENCOUNTER — Encounter (INDEPENDENT_AMBULATORY_CARE_PROVIDER_SITE_OTHER): Payer: PRIVATE HEALTH INSURANCE | Admitting: General Surgery

## 2012-07-23 ENCOUNTER — Encounter (HOSPITAL_COMMUNITY): Payer: Self-pay | Admitting: Emergency Medicine

## 2012-07-23 ENCOUNTER — Emergency Department (HOSPITAL_COMMUNITY)
Admission: EM | Admit: 2012-07-23 | Discharge: 2012-07-23 | Disposition: A | Discharge status: Home / Self Care | Payer: Self-pay | Attending: Emergency Medicine | Admitting: Emergency Medicine

## 2012-07-23 DIAGNOSIS — F909 Attention-deficit hyperactivity disorder, unspecified type: Secondary | ICD-10-CM | POA: Insufficient documentation

## 2012-07-23 DIAGNOSIS — L0291 Cutaneous abscess, unspecified: Secondary | ICD-10-CM

## 2012-07-23 DIAGNOSIS — Z833 Family history of diabetes mellitus: Secondary | ICD-10-CM | POA: Insufficient documentation

## 2012-07-23 DIAGNOSIS — F3289 Other specified depressive episodes: Secondary | ICD-10-CM | POA: Insufficient documentation

## 2012-07-23 DIAGNOSIS — M4120 Other idiopathic scoliosis, site unspecified: Secondary | ICD-10-CM | POA: Insufficient documentation

## 2012-07-23 DIAGNOSIS — Z90710 Acquired absence of both cervix and uterus: Secondary | ICD-10-CM | POA: Insufficient documentation

## 2012-07-23 DIAGNOSIS — Z8614 Personal history of Methicillin resistant Staphylococcus aureus infection: Secondary | ICD-10-CM | POA: Insufficient documentation

## 2012-07-23 DIAGNOSIS — F17200 Nicotine dependence, unspecified, uncomplicated: Secondary | ICD-10-CM | POA: Insufficient documentation

## 2012-07-23 DIAGNOSIS — Z8249 Family history of ischemic heart disease and other diseases of the circulatory system: Secondary | ICD-10-CM | POA: Insufficient documentation

## 2012-07-23 DIAGNOSIS — M129 Arthropathy, unspecified: Secondary | ICD-10-CM | POA: Insufficient documentation

## 2012-07-23 DIAGNOSIS — Z818 Family history of other mental and behavioral disorders: Secondary | ICD-10-CM | POA: Insufficient documentation

## 2012-07-23 DIAGNOSIS — J45909 Unspecified asthma, uncomplicated: Secondary | ICD-10-CM | POA: Insufficient documentation

## 2012-07-23 DIAGNOSIS — L02219 Cutaneous abscess of trunk, unspecified: Secondary | ICD-10-CM | POA: Insufficient documentation

## 2012-07-23 DIAGNOSIS — Z801 Family history of malignant neoplasm of trachea, bronchus and lung: Secondary | ICD-10-CM | POA: Insufficient documentation

## 2012-07-23 DIAGNOSIS — L02419 Cutaneous abscess of limb, unspecified: Secondary | ICD-10-CM | POA: Insufficient documentation

## 2012-07-23 DIAGNOSIS — F329 Major depressive disorder, single episode, unspecified: Secondary | ICD-10-CM | POA: Insufficient documentation

## 2012-07-23 DIAGNOSIS — I10 Essential (primary) hypertension: Secondary | ICD-10-CM | POA: Insufficient documentation

## 2012-07-23 MED ORDER — DOXYCYCLINE HYCLATE 100 MG PO CAPS: 100.0000 mg | ORAL_CAPSULE | Freq: Two times a day (BID) | ORAL | Status: AC

## 2012-07-23 MED ORDER — HYDROCODONE-ACETAMINOPHEN 5-325 MG PO TABS: 1.0000 | ORAL_TABLET | ORAL | Status: DC | PRN

## 2012-07-23 NOTE — ED Notes (Signed)
MD at bedside. 

## 2012-07-23 NOTE — ED Provider Notes (Signed)
Medical screening examination/treatment/procedure(s) were performed by non-physician practitioner and as supervising physician I was immediately available for consultation/collaboration.  Helaina Stefano K Linker, MD 07/23/12 1847 

## 2012-07-23 NOTE — ED Notes (Signed)
Pt presenting to ed with abscess to her left breast, left axilla, and left thigh x 3 days with positive chills. Pt states she has history of mrsa.

## 2012-07-23 NOTE — ED Provider Notes (Signed)
History     CSN: [id number]  Arrival date & time 07/23/12  1533   First MD Initiated Contact with Patient 07/23/12 1635      Chief Complaint  Patient presents with  . Abscess    (Consider location/radiation/quality/duration/timing/severity/associated sxs/prior treatment) Patient is a 31 y.o. female presenting with abscess. The history is provided by the patient.  Abscess  This is a recurrent problem. The current episode started less than one week ago. The onset was gradual. The problem occurs continuously. The problem has been gradually worsening. The abscess is present on the left upper leg (left axilla, left chest wall). The problem is moderate. The abscess is characterized by painfulness, swelling and redness. The abscess first occurred at home. Pertinent negatives include no fever and no vomiting. She has received no recent medical care.    Past Medical History  Diagnosis Date  . Anemia   . ADHD (attention deficit hyperactivity disorder)   . Back pain   . Scoliosis   . Leukemia at age 59  . Headache   . Endometriosis   . Hydradenitis   . S/P total abdominal hysterectomy 06/26/2011  . Thyroid disease   . MRSA (methicillin resistant staph aureus) culture positive   . Hydradenitis 07/13/2011  . Abdominal wall mass   . Arthritis   . Chest pain   . Migraines   . Diarrhea   . Nausea   . Asthma   . Hypertension     on inderal  . Depression     tried several meds, not on any now    Past Surgical History  Procedure Date  . Laparoscopic endometriosis fulguration 06/1999  . Axillary hidradenitis excision     bilateral  . Cesarean section 08/31/2006, 08/26/2007  . Abdominal hysterectomy 06/30/2008    partial  . Mass excision 06/19/2012    Procedure: EXCISION MASS;  Surgeon: Robyne Askew, MD;  Location: [facility];  Service: General;  Laterality: Right;  excision mass right abdominal wall    Family History  Problem Relation Age of Onset  . Heart disease  Mother   . Diabetes Mother   . Hypertension Mother   . Heart disease Father   . Diabetes Father   . Hypertension Father   . Bipolar disorder Sister   . Bipolar disorder Brother   . Cancer Brother     prostate  . Pancreatic cancer Maternal Grandmother   . Cancer Maternal Grandmother     pancreatic  . Cancer Maternal Uncle     lung  . Cancer Paternal Aunt     History  Substance Use Topics  . Smoking status: Current Everyday Smoker -- 1.0 packs/day for 11 years    Types: Cigarettes  . Smokeless tobacco: Never Used   Comment: too much stress  . Alcohol Use: No    OB History    Grav Para Term Preterm Abortions TAB SAB Ect Mult Living   2 2  2      2       Review of Systems  Constitutional: Negative for fever.  Gastrointestinal: Negative for nausea and vomiting.  Skin:       See HPI, otherwise negative    Allergies  Oxycodone-acetaminophen; Percocet; Sulfonamide derivatives; and Morphine and related  Home Medications   Current Outpatient Rx  Name Route Sig Dispense Refill  . ALBUTEROL SULFATE HFA 108 (90 BASE) MCG/ACT IN AERS Inhalation Inhale 2 puffs into the lungs every 6 (six) hours as needed. Wheezing.    Marland Kitchen  HYDROCODONE-ACETAMINOPHEN 5-325 MG PO TABS Oral Take 1-2 tablets by mouth every 4 (four) hours as needed. Pain.    Marland Kitchen OVER THE COUNTER MEDICATION Both Ears Place 4 drops into both ears 2 (two) times daily. Ear drops for ear infection.    Marland Kitchen PROPRANOLOL HCL 40 MG PO TABS Oral Take 40 mg by mouth 2 (two) times daily.      BP 104/72  Pulse 88  Temp 98.4 F (36.9 C) (Oral)  Resp 18  SpO2 98%  Physical Exam  Nursing note reviewed. Constitutional: She appears well-developed and well-nourished. No distress.       Vital signs are reviewed and are normal.  HENT:  Head: Normocephalic and atraumatic.       MMM  Eyes: Conjunctivae are normal.  Neck: Neck supple.  Cardiovascular: Normal rate and regular rhythm.   Pulmonary/Chest: Effort normal. No respiratory  distress.  Abdominal: Soft. She exhibits no distension. There is no tenderness.  Musculoskeletal: She exhibits no edema.  Neurological: She is alert.  Skin: Skin is warm and dry.       1cm area of induration with erythema to left axilla, no central fluctuance. 1 cm area of fluctuance to upper left thigh with small area surrounding erythema and induration 1cm area of fluctuance to left anterior inferior chest wall, 1cm surrounding erythema and induration    ED Course  Procedures (including critical care time) INCISION AND DRAINAGE Performed by: Lorenz Coaster Consent: Verbal consent obtained. Risks and benefits: risks, benefits and alternatives were discussed Type: abscess  Body area: left proximal anterior thigh  Anesthesia: local infiltration  Local anesthetic: lidocaine 1% with epinephrine  Anesthetic total: 0.5 ml  Complexity: complex Blunt dissection to break up loculations  Drainage: purulent  Drainage amount: small  Packing material: none  Patient tolerance: Patient tolerated the procedure well with no immediate complications.   INCISION AND DRAINAGE Performed by: Lorenz Coaster Consent: Verbal consent obtained. Risks and benefits: risks, benefits and alternatives were discussed Type: abscess  Body area: left lower anterior chest wall  Anesthesia: local infiltration  Local anesthetic: lidocaine 1% with epinephrine  Anesthetic total: 1 ml  Complexity: complex Blunt dissection to break up loculations  Drainage: purulent  Drainage amount: moderate  Packing material: none  Patient tolerance: Patient tolerated the procedure well with no immediate complications.       Labs Reviewed - No data to display No results found.   1. Abscess of multiple sites       MDM  Abscess x 3. 2 amenable to drainage at this time. Neither large enough to warrant packing. Given hx of poor recovery, will give abx though no cellulitis on exam and no signs of  systemic illness. Pain rx.        Shaaron Adler, [place] 07/23/12 Rickey Primus

## 2012-07-28 ENCOUNTER — Encounter (INDEPENDENT_AMBULATORY_CARE_PROVIDER_SITE_OTHER): Payer: Self-pay | Admitting: General Surgery

## 2012-07-28 ENCOUNTER — Ambulatory Visit (INDEPENDENT_AMBULATORY_CARE_PROVIDER_SITE_OTHER): Payer: PRIVATE HEALTH INSURANCE | Admitting: General Surgery

## 2012-07-28 VITALS — BP 115/64 | HR 82 | Temp 98.3°F | Resp 14 | Ht 61.0 in | Wt 139.6 lb

## 2012-07-28 DIAGNOSIS — R222 Localized swelling, mass and lump, trunk: Secondary | ICD-10-CM

## 2012-07-28 DIAGNOSIS — R1900 Intra-abdominal and pelvic swelling, mass and lump, unspecified site: Secondary | ICD-10-CM

## 2012-07-28 NOTE — Progress Notes (Signed)
Subjective:     Patient ID: [name], female   DOB: September 27, 1981, 31 y.o.   MRN: [id number]  HPI The patient is a 31 year old white female who is several weeks out from excision of an abdominal wall mass from her right lower quadrant. Her pathology revealed this to be an area of endometriosis. She feels much better and does not have any pain at that location anymore. Her only other complaint is that she may be having some areas of hidradenitis recur.  Review of Systems     Objective:   Physical Exam On physical exam her right lower quadrant incision has healed nicely. There is no sign of infection. The areas of hidradenitis did not appear to be acutely inflamed infected or draining.    Assessment:     Status post excision of endometriosis from her right lower quadrant abdominal wall    Plan:     At this point she can return on her normal activities without any restrictions. We will plan to see her back on a p.r.n. basis.

## 2012-07-28 NOTE — Patient Instructions (Signed)
Wash with Hibiclens soap regularly

## 2012-08-17 ENCOUNTER — Encounter (HOSPITAL_COMMUNITY): Payer: Self-pay | Admitting: Emergency Medicine

## 2012-08-17 ENCOUNTER — Emergency Department (HOSPITAL_COMMUNITY): Payer: Medicaid Other

## 2012-08-17 ENCOUNTER — Emergency Department (HOSPITAL_COMMUNITY)
Admission: EM | Admit: 2012-08-17 | Discharge: 2012-08-17 | Disposition: A | Discharge status: Home / Self Care | Payer: Medicaid Other | Attending: Emergency Medicine | Admitting: Emergency Medicine

## 2012-08-17 DIAGNOSIS — M4120 Other idiopathic scoliosis, site unspecified: Secondary | ICD-10-CM | POA: Insufficient documentation

## 2012-08-17 DIAGNOSIS — M25551 Pain in right hip: Secondary | ICD-10-CM

## 2012-08-17 DIAGNOSIS — M545 Low back pain, unspecified: Secondary | ICD-10-CM | POA: Insufficient documentation

## 2012-08-17 DIAGNOSIS — Z8614 Personal history of Methicillin resistant Staphylococcus aureus infection: Secondary | ICD-10-CM | POA: Insufficient documentation

## 2012-08-17 DIAGNOSIS — F17200 Nicotine dependence, unspecified, uncomplicated: Secondary | ICD-10-CM | POA: Insufficient documentation

## 2012-08-17 DIAGNOSIS — F909 Attention-deficit hyperactivity disorder, unspecified type: Secondary | ICD-10-CM | POA: Insufficient documentation

## 2012-08-17 DIAGNOSIS — M25559 Pain in unspecified hip: Secondary | ICD-10-CM | POA: Insufficient documentation

## 2012-08-17 DIAGNOSIS — Z886 Allergy status to analgesic agent status: Secondary | ICD-10-CM | POA: Insufficient documentation

## 2012-08-17 DIAGNOSIS — I10 Essential (primary) hypertension: Secondary | ICD-10-CM | POA: Insufficient documentation

## 2012-08-17 DIAGNOSIS — Z882 Allergy status to sulfonamides status: Secondary | ICD-10-CM | POA: Insufficient documentation

## 2012-08-17 DIAGNOSIS — Z856 Personal history of leukemia: Secondary | ICD-10-CM | POA: Insufficient documentation

## 2012-08-17 DIAGNOSIS — Z8742 Personal history of other diseases of the female genital tract: Secondary | ICD-10-CM | POA: Insufficient documentation

## 2012-08-17 MED ORDER — IBUPROFEN 600 MG PO TABS: 600.0000 mg | ORAL_TABLET | Freq: Four times a day (QID) | ORAL | Status: AC | PRN

## 2012-08-17 MED ORDER — IBUPROFEN 600 MG PO TABS: 600.0000 mg | ORAL_TABLET | Freq: Four times a day (QID) | ORAL | Status: DC | PRN

## 2012-08-17 MED ORDER — HYDROCODONE-ACETAMINOPHEN 5-500 MG PO TABS: 1.0000 | ORAL_TABLET | Freq: Four times a day (QID) | ORAL | Status: AC | PRN

## 2012-08-17 MED ORDER — HYDROCODONE-ACETAMINOPHEN 5-500 MG PO TABS: 1.0000 | ORAL_TABLET | Freq: Four times a day (QID) | ORAL | Status: DC | PRN

## 2012-08-17 MED ORDER — KETOROLAC TROMETHAMINE 30 MG/ML IJ SOLN
30.0000 mg | Freq: Once | INTRAMUSCULAR | Status: AC
  Administered 2012-08-17: 30 mg via INTRAMUSCULAR
  Filled 2012-08-17: qty 1

## 2012-08-17 NOTE — ED Notes (Signed)
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 Concerned that may be at risk for osteoporosis, due to partial hysterectomy at 31 y/o.
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
## 2012-08-19 NOTE — ED Provider Notes (Signed)
I saw and evaluated the patient, reviewed the resident's note and I agree with the findings and plan.   .Face to face Exam:  General:  Awake HEENT:  Atraumatic Resp:  Normal effort Abd:  Nondistended Neuro:No focal weakness Lymph: No adenopathy   [name], MD 08/19/12 2251

## 2012-08-26 IMAGING — CT CT ABD-PELV W/ CM
1 of 2 series · 15 of 32 positions shown, 19 images · IV contrast (APPLIED)
Comparison: 07/03/2008

CLINICAL DATA: Right lower quadrant pain and palpable abnormality.
Previous hysterectomy.

CT ABDOMEN AND PELVIS WITH CONTRAST
TECHNIQUE: Multidetector CT imaging of the abdomen and pelvis was
performed following the standard protocol during bolus
administration of intravenous contrast.
Contrast:  100 ml Lmnipaque-V55 and oral contrast

[Series 2: abd/pel with · axial · 0.69mm/px · z∈[+808,+1213]mm · 15 of 89 slices shown, 19 images]
[im 4/89  soft-tissue]
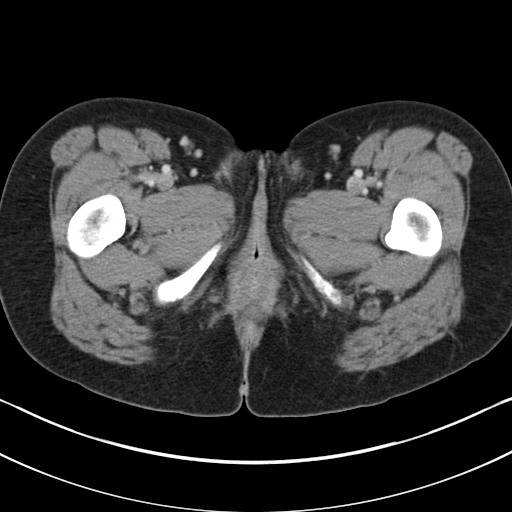
[im 4/89  bone]
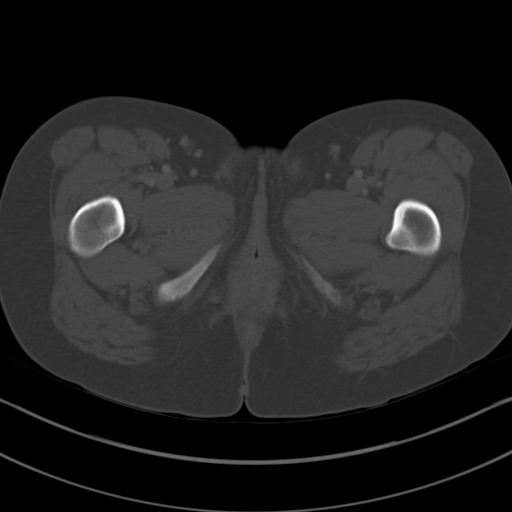
[im 11/89  soft-tissue]
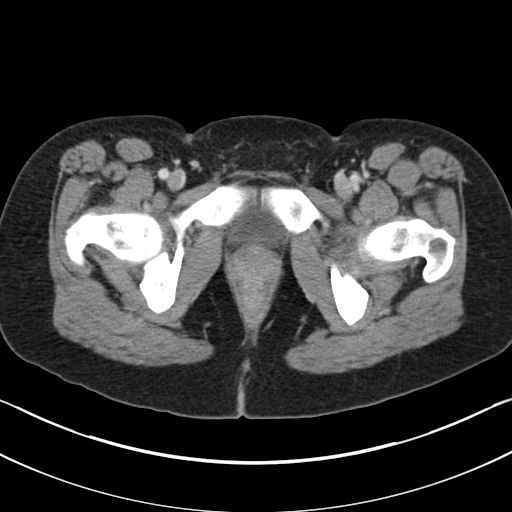
[im 17/89  soft-tissue]
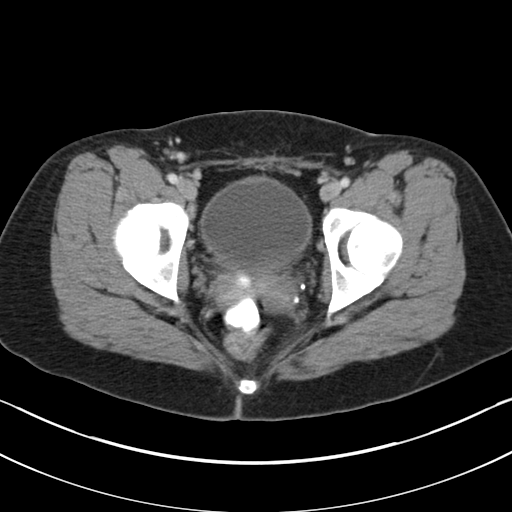
[im 24/89  soft-tissue]
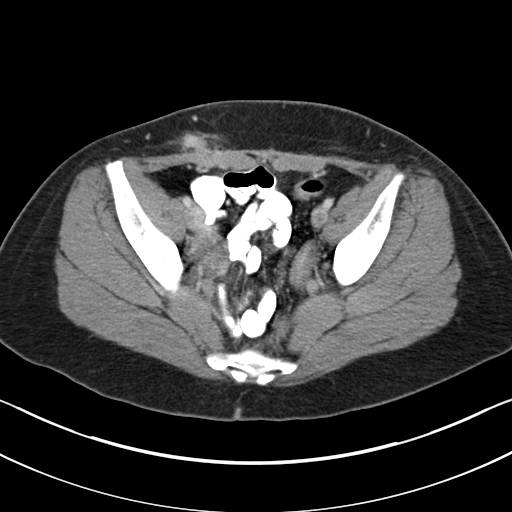
[im 31/89  soft-tissue]
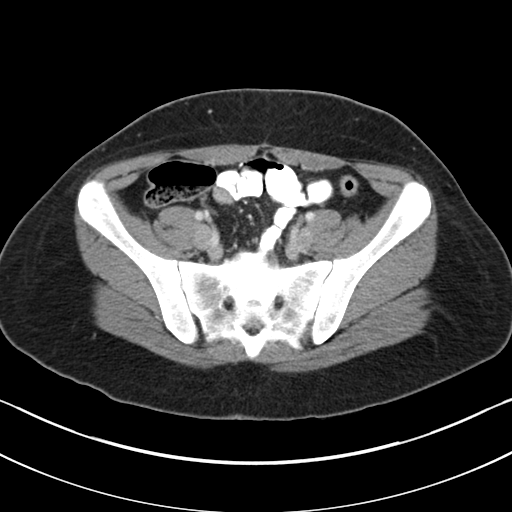
[im 38/89  soft-tissue]
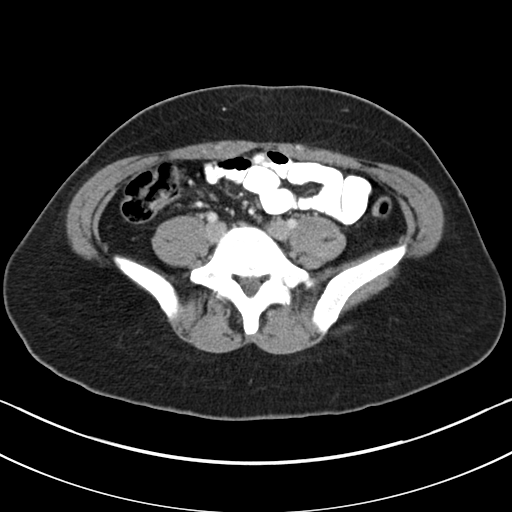
[im 45/89  soft-tissue]
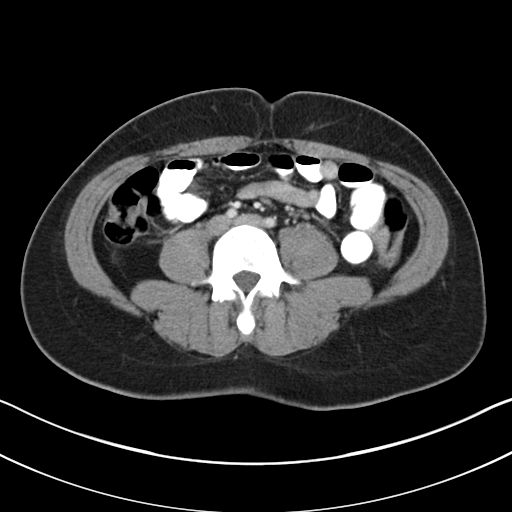
[im 51/89  soft-tissue]
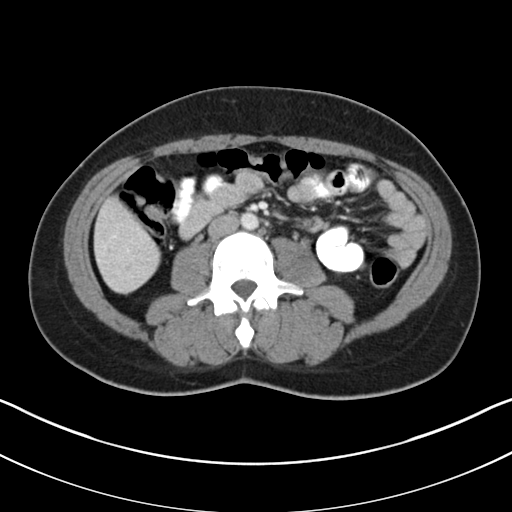
[im 58/89  soft-tissue]
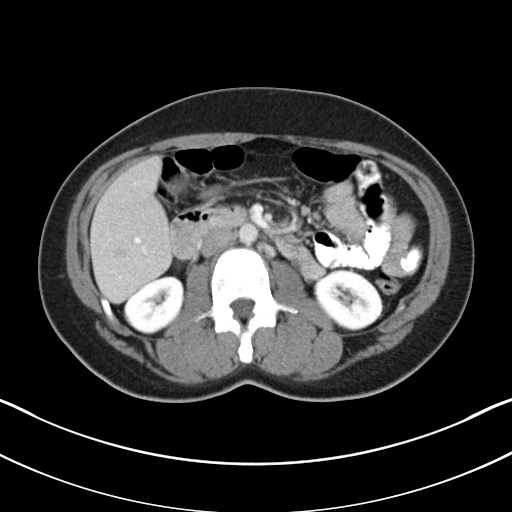
[im 58/89  bone]
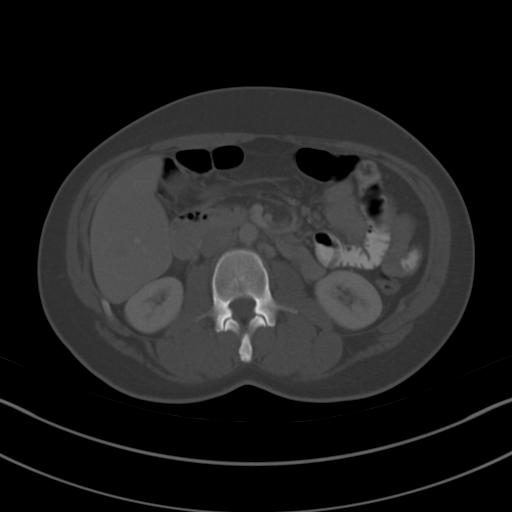
[im 65/89  soft-tissue]
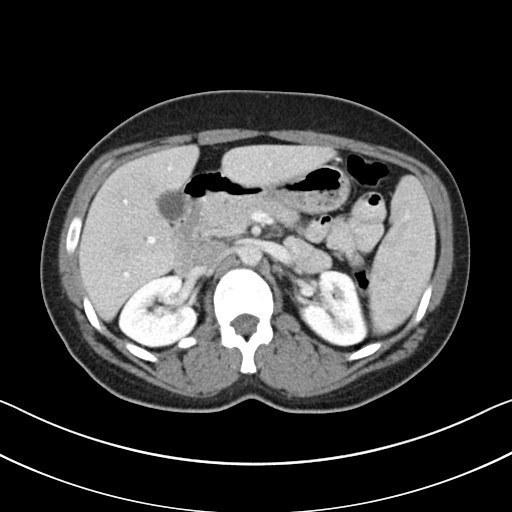
[im 72/89  soft-tissue]
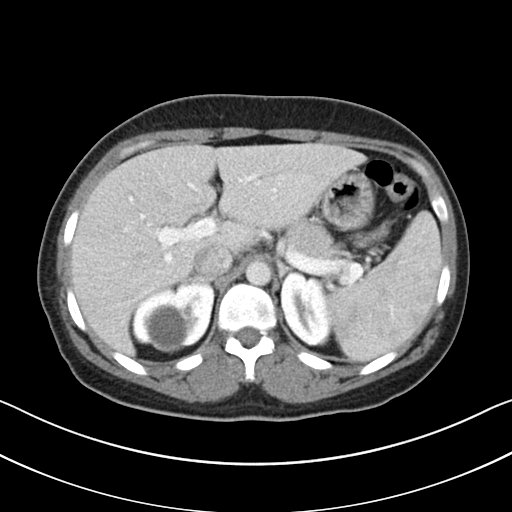
[im 75/89  lung]
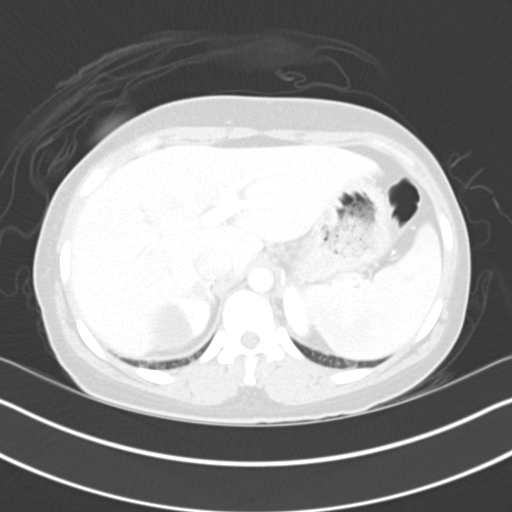
[im 78/89  soft-tissue]
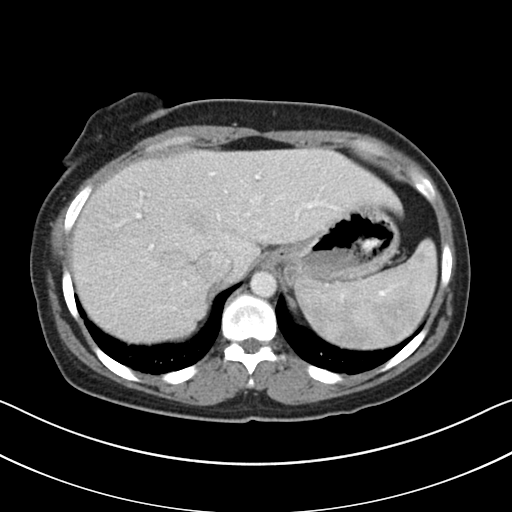
[im 78/89  lung]
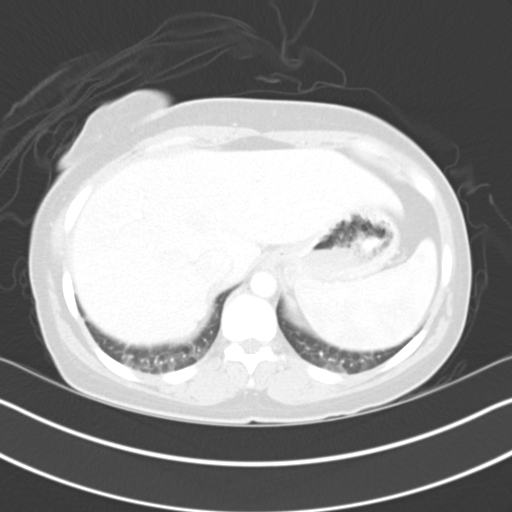
[im 82/89  lung]
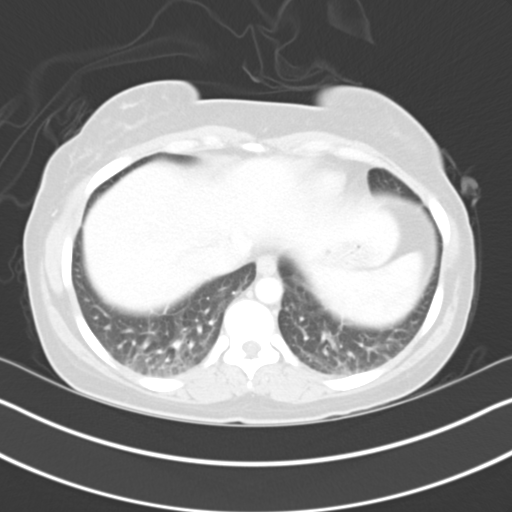
[im 85/89  soft-tissue]
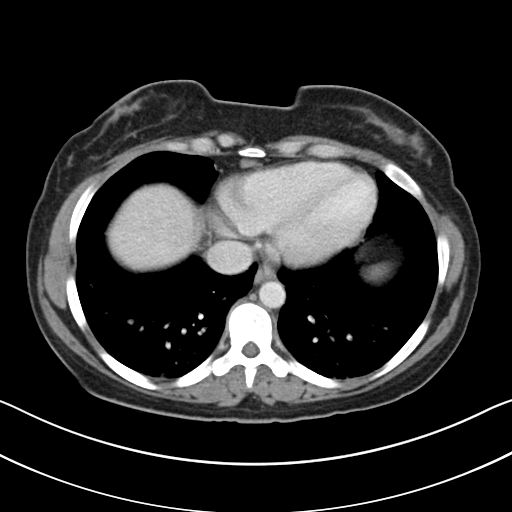
[im 85/89  lung]
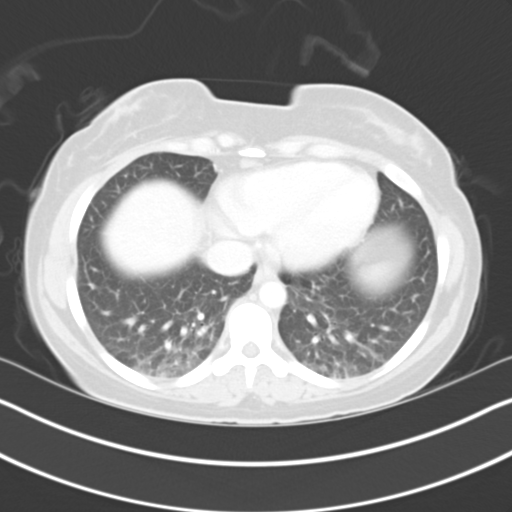

[15 of 32 positions shown; findings below may reference images not displayed]

FINDINGS: The abdominal parenchymal organs are normal appearance
except for a simple cyst in the upper pole of the right kidney
measuring 3.7 cm which is decreased in size since previous study.
Gallbladder is unremarkable.  No evidence of hydronephrosis.  No
abdominal soft tissue masses or lymphadenopathy identified.

Previous hysterectomy noted.  A small cyst is seen in the right
adnexa measuring 2.4 cm, likely representing a physiologic cyst in
a reproductive age female. In addition, there is a solid enhancing
mass seen in the subcutaneous tissues of the right lower quadrant
anterior abdominal wall which measures 1.7 x 2.0 cm.  This is
suspicious for endometriosis, at site of previous surgical
incision.  No evidence of abdominal wall hernia.

No inflammatory process or lymphadenopathy identified within the
abdomen or pelvis.  No evidence of abnormal fluid collections.  No
evidence of bowel wall thickening or dilatation.
IMPRESSION: 1.  2 cm enhancing mass in the deep subcutaneous tissues of the
right lower quadrant anterior abdominal wall, suspicious for
endometriosis at site of previous surgical incision.  Consideration
of biopsy or surgical removal is recommended.
2.  2.4 cm right adnexal cyst, most likely representing a
physiologic ovarian cyst in a reproductive age female.

## 2012-09-10 ENCOUNTER — Emergency Department (HOSPITAL_COMMUNITY): Payer: Self-pay

## 2012-09-10 ENCOUNTER — Emergency Department (HOSPITAL_COMMUNITY)
Admission: EM | Admit: 2012-09-10 | Discharge: 2012-09-10 | Disposition: A | Discharge status: Home / Self Care | Payer: Self-pay | Attending: Emergency Medicine | Admitting: Emergency Medicine

## 2012-09-10 ENCOUNTER — Encounter: Payer: Self-pay | Admitting: Family Medicine

## 2012-09-10 ENCOUNTER — Encounter (HOSPITAL_COMMUNITY): Payer: Self-pay | Admitting: *Deleted

## 2012-09-10 ENCOUNTER — Ambulatory Visit (INDEPENDENT_AMBULATORY_CARE_PROVIDER_SITE_OTHER): Payer: Self-pay | Admitting: Family Medicine

## 2012-09-10 VITALS — BP 148/100 | HR 86 | Ht 61.0 in | Wt 139.0 lb

## 2012-09-10 DIAGNOSIS — E079 Disorder of thyroid, unspecified: Secondary | ICD-10-CM | POA: Insufficient documentation

## 2012-09-10 DIAGNOSIS — D649 Anemia, unspecified: Secondary | ICD-10-CM | POA: Insufficient documentation

## 2012-09-10 DIAGNOSIS — IMO0002 Reserved for concepts with insufficient information to code with codable children: Secondary | ICD-10-CM

## 2012-09-10 DIAGNOSIS — S022XXA Fracture of nasal bones, initial encounter for closed fracture: Secondary | ICD-10-CM | POA: Insufficient documentation

## 2012-09-10 DIAGNOSIS — M129 Arthropathy, unspecified: Secondary | ICD-10-CM | POA: Insufficient documentation

## 2012-09-10 DIAGNOSIS — T7491XA Unspecified adult maltreatment, confirmed, initial encounter: Secondary | ICD-10-CM

## 2012-09-10 DIAGNOSIS — Z8614 Personal history of Methicillin resistant Staphylococcus aureus infection: Secondary | ICD-10-CM | POA: Insufficient documentation

## 2012-09-10 DIAGNOSIS — Z856 Personal history of leukemia: Secondary | ICD-10-CM | POA: Insufficient documentation

## 2012-09-10 DIAGNOSIS — F17200 Nicotine dependence, unspecified, uncomplicated: Secondary | ICD-10-CM | POA: Insufficient documentation

## 2012-09-10 DIAGNOSIS — M4120 Other idiopathic scoliosis, site unspecified: Secondary | ICD-10-CM | POA: Insufficient documentation

## 2012-09-10 DIAGNOSIS — I10 Essential (primary) hypertension: Secondary | ICD-10-CM | POA: Insufficient documentation

## 2012-09-10 DIAGNOSIS — F909 Attention-deficit hyperactivity disorder, unspecified type: Secondary | ICD-10-CM | POA: Insufficient documentation

## 2012-09-10 MED ORDER — TRAMADOL HCL 50 MG PO TABS: 50.0000 mg | ORAL_TABLET | Freq: Four times a day (QID) | ORAL | Status: AC | PRN

## 2012-09-10 MED ORDER — HYDROCODONE-ACETAMINOPHEN 5-500 MG PO TABS: 1.0000 | ORAL_TABLET | Freq: Three times a day (TID) | ORAL | Status: AC | PRN

## 2012-09-10 MED ORDER — NAPROXEN 500 MG PO TABS: 500.0000 mg | ORAL_TABLET | Freq: Two times a day (BID) | ORAL | Status: DC

## 2012-09-10 MED ORDER — IBUPROFEN 600 MG PO TABS: 600.0000 mg | ORAL_TABLET | Freq: Four times a day (QID) | ORAL | Status: AC | PRN

## 2012-09-10 MED ORDER — HYDROCODONE-ACETAMINOPHEN 5-325 MG PO TABS
1.0000 | ORAL_TABLET | Freq: Once | ORAL | Status: AC
  Administered 2012-09-10: 1 via ORAL
  Filled 2012-09-10: qty 1
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
## 2012-09-10 NOTE — Patient Instructions (Addendum)
Naproxen sent to your pharmacy  Hydrocodone short term  You are doing the right thing for yourself and your kids
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
## 2012-09-10 NOTE — ED Provider Notes (Signed)
Medical screening examination/treatment/procedure(s) were performed by non-physician practitioner and as supervising physician I was immediately available for consultation/collaboration.   Maryn Freelove H Valdez Brannan, MD 09/10/12 1851 
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
## 2012-10-08 ENCOUNTER — Other Ambulatory Visit: Payer: Self-pay | Admitting: Family Medicine

## 2012-11-07 ENCOUNTER — Encounter (HOSPITAL_COMMUNITY): Payer: Self-pay | Admitting: Emergency Medicine

## 2012-11-07 ENCOUNTER — Emergency Department (HOSPITAL_COMMUNITY)
Admission: EM | Admit: 2012-11-07 | Discharge: 2012-11-07 | Disposition: A | Discharge status: Home / Self Care | Payer: Medicaid Other | Attending: Emergency Medicine | Admitting: Emergency Medicine

## 2012-11-07 DIAGNOSIS — F988 Other specified behavioral and emotional disorders with onset usually occurring in childhood and adolescence: Secondary | ICD-10-CM | POA: Insufficient documentation

## 2012-11-07 DIAGNOSIS — I10 Essential (primary) hypertension: Secondary | ICD-10-CM | POA: Insufficient documentation

## 2012-11-07 DIAGNOSIS — R110 Nausea: Secondary | ICD-10-CM | POA: Insufficient documentation

## 2012-11-07 DIAGNOSIS — Z862 Personal history of diseases of the blood and blood-forming organs and certain disorders involving the immune mechanism: Secondary | ICD-10-CM | POA: Insufficient documentation

## 2012-11-07 DIAGNOSIS — M129 Arthropathy, unspecified: Secondary | ICD-10-CM | POA: Insufficient documentation

## 2012-11-07 DIAGNOSIS — G43909 Migraine, unspecified, not intractable, without status migrainosus: Secondary | ICD-10-CM

## 2012-11-07 DIAGNOSIS — E079 Disorder of thyroid, unspecified: Secondary | ICD-10-CM | POA: Insufficient documentation

## 2012-11-07 DIAGNOSIS — Z79899 Other long term (current) drug therapy: Secondary | ICD-10-CM | POA: Insufficient documentation

## 2012-11-07 DIAGNOSIS — Z856 Personal history of leukemia: Secondary | ICD-10-CM | POA: Insufficient documentation

## 2012-11-07 DIAGNOSIS — R209 Unspecified disturbances of skin sensation: Secondary | ICD-10-CM | POA: Insufficient documentation

## 2012-11-07 DIAGNOSIS — Z8614 Personal history of Methicillin resistant Staphylococcus aureus infection: Secondary | ICD-10-CM | POA: Insufficient documentation

## 2012-11-07 DIAGNOSIS — F3289 Other specified depressive episodes: Secondary | ICD-10-CM | POA: Insufficient documentation

## 2012-11-07 DIAGNOSIS — IMO0001 Reserved for inherently not codable concepts without codable children: Secondary | ICD-10-CM

## 2012-11-07 DIAGNOSIS — Z872 Personal history of diseases of the skin and subcutaneous tissue: Secondary | ICD-10-CM | POA: Insufficient documentation

## 2012-11-07 DIAGNOSIS — Z87448 Personal history of other diseases of urinary system: Secondary | ICD-10-CM | POA: Insufficient documentation

## 2012-11-07 DIAGNOSIS — J45909 Unspecified asthma, uncomplicated: Secondary | ICD-10-CM | POA: Insufficient documentation

## 2012-11-07 DIAGNOSIS — F17200 Nicotine dependence, unspecified, uncomplicated: Secondary | ICD-10-CM | POA: Insufficient documentation

## 2012-11-07 DIAGNOSIS — Z7982 Long term (current) use of aspirin: Secondary | ICD-10-CM | POA: Insufficient documentation

## 2012-11-07 DIAGNOSIS — R079 Chest pain, unspecified: Secondary | ICD-10-CM | POA: Insufficient documentation

## 2012-11-07 DIAGNOSIS — F329 Major depressive disorder, single episode, unspecified: Secondary | ICD-10-CM | POA: Insufficient documentation

## 2012-11-07 DIAGNOSIS — Z8739 Personal history of other diseases of the musculoskeletal system and connective tissue: Secondary | ICD-10-CM | POA: Insufficient documentation

## 2012-11-07 LAB — COMPREHENSIVE METABOLIC PANEL
ALT: 7 U/L (ref 0–35)
AST: 10 U/L (ref 0–37)
Albumin: 3.8 g/dL (ref 3.5–5.2)
Alkaline Phosphatase: 69 U/L (ref 39–117)
BUN: 8 mg/dL (ref 6–23)
CO2: 22 mEq/L (ref 19–32)
Calcium: 9 mg/dL (ref 8.4–10.5)
Chloride: 103 mEq/L (ref 96–112)
Creatinine, Ser: 0.63 mg/dL (ref 0.50–1.10)
GFR calc Af Amer: >90 mL/min (ref >90)
GFR calc non Af Amer: >90 mL/min (ref >90)
Glucose, Bld: 78 mg/dL (ref 70–99)
Potassium: 3.6 mEq/L (ref 3.5–5.1)
Sodium: 135 mEq/L (ref 135–145)
Total Bilirubin: 0.3 mg/dL (ref 0.3–1.2)
Total Protein: 7 g/dL (ref 6.0–8.3)

## 2012-11-07 LAB — URINALYSIS, ROUTINE W REFLEX MICROSCOPIC
Bilirubin Urine: NEGATIVE
Glucose, UA: NEGATIVE mg/dL
Hgb urine dipstick: NEGATIVE
Ketones, ur: NEGATIVE mg/dL
Leukocytes, UA: NEGATIVE
Nitrite: NEGATIVE
Protein, ur: NEGATIVE mg/dL
Specific Gravity, Urine: 1.012 (ref 1.005–1.030)
Urobilinogen, UA: 1 mg/dL (ref 0.0–1.0)
pH: 6 (ref 5.0–8.0)

## 2012-11-07 LAB — CBC WITH DIFFERENTIAL/PLATELET
Basophils Absolute: 0.1 10*3/uL (ref 0.0–0.1)
Basophils Relative: 1 % (ref 0–1)
Eosinophils Absolute: 0.2 10*3/uL (ref 0.0–0.7)
Eosinophils Relative: 2 % (ref 0–5)
HCT: 44.8 % (ref 36.0–46.0)
Hemoglobin: 15.4 g/dL — ABNORMAL HIGH (ref 12.0–15.0)
Lymphocytes Relative: 34 % (ref 12–46)
Lymphs Abs: 2.7 10*3/uL (ref 0.7–4.0)
MCH: 29.1 pg (ref 26.0–34.0)
MCHC: 34.4 g/dL (ref 30.0–36.0)
MCV: 84.5 fL (ref 78.0–100.0)
Monocytes Absolute: 0.7 10*3/uL (ref 0.1–1.0)
Monocytes Relative: 9 % (ref 3–12)
Neutro Abs: 4.4 10*3/uL (ref 1.7–7.7)
Neutrophils Relative %: 55 % (ref 43–77)
Platelets: 191 10*3/uL (ref 150–400)
RBC: 5.3 MIL/uL — ABNORMAL HIGH (ref 3.87–5.11)
RDW: 13 % (ref 11.5–15.5)
WBC: 7.9 10*3/uL (ref 4.0–10.5)

## 2012-11-07 MED ORDER — SODIUM CHLORIDE 0.9 % IV SOLN
Freq: Once | INTRAVENOUS | Status: AC
  Administered 2012-11-07: 16:00:00 via INTRAVENOUS

## 2012-11-07 MED ORDER — ONDANSETRON HCL 4 MG/2ML IJ SOLN
4.0000 mg | Freq: Once | INTRAMUSCULAR | Status: AC
  Administered 2012-11-07: 4 mg via INTRAVENOUS
  Filled 2012-11-07: qty 2

## 2012-11-07 NOTE — ED Notes (Signed)
Pt presenting to ed with c/o headache pain x 1 week pt states she was in [employer] and developed numbness and tingling in her bilateral hands. Pt states she has numbness on both sides of her face. Pt states positive nausea and no vomiting. Pt states history of high blood pressure and she has been taking her medications. Pt with no slurred speech, no facial droop noted at triage. Pt with bilateral equal grips. Pt with no neuro deficits noted at this time

## 2012-11-07 NOTE — ED Provider Notes (Signed)
History     CSN: [id number]  Arrival date & time 11/07/12  1414   First MD Initiated Contact with Patient 11/07/12 1540      Chief Complaint  Patient presents with  . Headache    (Consider location/radiation/quality/duration/timing/severity/associated sxs/prior treatment) HPI Comments: Patient presents today with a chief complaint of headache.  She reports that the headache has been intermittent over the past week.  Headache typically resolved with Excedrin.  Headache gradual in onset.  She reports that she has had similar headaches in the past.  PMH significant for migraine headaches.  She reports that earlier today she felt numbness of both of her hands and both sides of her face.  Symptoms lasted a few minutes and then resolved.  Her headache has also resolved at this time.  She reports that she has had some nausea associated with HA, but no vomiting.  Nausea has also resolved at this time.  Patient is a 31 y.o. female presenting with headaches. The history is provided by the patient.  Headache  Associated symptoms include nausea. Pertinent negatives include no fever, no chest pressure, no near-syncope, no palpitations, no syncope, no shortness of breath and no vomiting.    Past Medical History  Diagnosis Date  . Anemia   . ADHD (attention deficit hyperactivity disorder)   . Back pain   . Scoliosis   . Leukemia at age 84  . Headache   . Endometriosis   . Hydradenitis   . S/P total abdominal hysterectomy 06/26/2011  . Thyroid disease   . MRSA (methicillin resistant staph aureus) culture positive 2010    ear infection that cultured out MRSA   . Hydradenitis 07/13/2011  . Abdominal wall mass   . Arthritis   . Chest pain   . Migraines   . Diarrhea   . Nausea   . Asthma   . Hypertension     on inderal  . Depression     tried several meds, not on any now    Past Surgical History  Procedure Date  . Laparoscopic endometriosis fulguration 06/1999  . Axillary hidradenitis  excision     bilateral  . Cesarean section 08/31/2006, 08/26/2007  . Abdominal hysterectomy 06/30/2008    partial  . Mass excision 06/19/2012    Procedure: EXCISION MASS;  Surgeon: Robyne Askew, MD;  Location: [facility];  Service: General;  Laterality: Right;  excision mass right abdominal wall    Family History  Problem Relation Age of Onset  . Heart disease Mother   . Diabetes Mother   . Hypertension Mother   . Heart disease Father   . Diabetes Father   . Hypertension Father   . Bipolar disorder Sister   . Bipolar disorder Brother   . Cancer Brother     prostate  . Pancreatic cancer Maternal Grandmother   . Cancer Maternal Grandmother     pancreatic  . Cancer Maternal Uncle     lung  . Cancer Paternal Aunt     History  Substance Use Topics  . Smoking status: Current Every Day Smoker -- 1.0 packs/day for 11 years    Types: Cigarettes  . Smokeless tobacco: Never Used     Comment: too much stress  . Alcohol Use: No    OB History    Grav Para Term Preterm Abortions TAB SAB Ect Mult Living   2 2  2      2       Review of  Systems  Constitutional: Negative for fever and chills.  HENT: Negative for neck pain and neck stiffness.   Respiratory: Negative for shortness of breath.   Cardiovascular: Negative for palpitations, syncope and near-syncope.  Gastrointestinal: Positive for nausea. Negative for vomiting.  Skin: Negative for rash.  Neurological: Positive for numbness and headaches. Negative for dizziness, syncope, facial asymmetry, speech difficulty, weakness and light-headedness.  Psychiatric/Behavioral: Negative for confusion.    Allergies  Oxycodone-acetaminophen; Percocet; Sulfonamide derivatives; and Morphine and related  Home Medications   Current Outpatient Rx  Name  Route  Sig  Dispense  Refill  . ALBUTEROL SULFATE HFA 108 (90 BASE) MCG/ACT IN AERS   Inhalation   Inhale 2 puffs into the lungs every 6 (six) hours as needed. Wheezing.           . ASPIRIN-ACETAMINOPHEN-CAFFEINE 250-250-65 MG PO TABS   Oral   Take 1 tablet by mouth every 6 (six) hours as needed. Headache         . HYDROCODONE-ACETAMINOPHEN 5-325 MG PO TABS   Oral   Take 1 tablet by mouth every 6 (six) hours as needed. Pain         . PROPRANOLOL HCL 40 MG PO TABS   Oral   Take 40 mg by mouth 2 (two) times daily.           BP 126/83  Pulse 73  Temp 97.9 F (36.6 C) (Oral)  Resp 16  SpO2 100%  Physical Exam  Nursing note and vitals reviewed. Constitutional: She appears well-developed and well-nourished. No distress.  HENT:  Head: Normocephalic and atraumatic.  Mouth/Throat: Oropharynx is clear and moist.  Eyes: EOM are normal. Pupils are equal, round, and reactive to light.  Neck: Normal range of motion. Neck supple.  Cardiovascular: Normal rate, regular rhythm and normal heart sounds.   Pulmonary/Chest: Effort normal and breath sounds normal.  Abdominal: Soft. There is no tenderness.  Musculoskeletal: Normal range of motion.  Neurological: She is alert. She has normal strength. No cranial nerve deficit or sensory deficit. Coordination and gait normal.       Normal finger to nose testing No visual field defect Normal rapid alternating movements  Skin: Skin is warm and dry. No rash noted. She is not diaphoretic.    ED Course  Procedures (including critical care time)  Labs Reviewed  URINALYSIS, ROUTINE W REFLEX MICROSCOPIC - Abnormal; Notable for the following:    APPearance CLOUDY (*)     All other components within normal limits  CBC WITH DIFFERENTIAL - Abnormal; Notable for the following:    RBC 5.30 (*)     Hemoglobin 15.4 (*)     All other components within normal limits  COMPREHENSIVE METABOLIC PANEL   No results found.   No diagnosis found.    MDM  Pt presenting with a chief complaint of headache.  Headache had resolved by the time of my evaluation.  Presentation is like pts typical HA and non concerning for [facility],  ICH, or Meningitis.   Pt is afebrile with no focal neuro deficits, nuchal rigidity, or change in vision.   Normal neurological exam.  Pt is to follow up with PCP to discuss prophylactic medication. Pt verbalizes understanding and is agreeable with plan to dc.         Pascal Lux [place], PA-C 11/08/12 [phone]

## 2012-11-11 NOTE — ED Provider Notes (Signed)
Medical screening examination/treatment/procedure(s) were performed by non-physician practitioner and as supervising physician I was immediately available for consultation/collaboration.  Raeford Razor, MD 11/11/12 [phone]

## 2012-11-24 ENCOUNTER — Encounter: Payer: Self-pay | Admitting: Family Medicine

## 2012-11-24 ENCOUNTER — Ambulatory Visit (INDEPENDENT_AMBULATORY_CARE_PROVIDER_SITE_OTHER): Payer: Medicaid Other | Admitting: Family Medicine

## 2012-11-24 VITALS — BP 135/94 | HR 69 | Ht 61.0 in | Wt 145.8 lb

## 2012-11-24 DIAGNOSIS — I10 Essential (primary) hypertension: Secondary | ICD-10-CM

## 2012-11-24 DIAGNOSIS — F17200 Nicotine dependence, unspecified, uncomplicated: Secondary | ICD-10-CM

## 2012-11-24 DIAGNOSIS — G43909 Migraine, unspecified, not intractable, without status migrainosus: Secondary | ICD-10-CM

## 2012-11-24 NOTE — Patient Instructions (Signed)
It was nice to meet you today.  The best thing you can do for yourself is to quit smoking!  Your blood pressure looks good today.  Keep taking the propranolol for your blood pressure and headaches.  Come back as needed, we can recheck your blood pressure in a few weeks to months.  Watch your salt intake over the holidays!  Have a great Thanksgiving!

## 2012-11-24 NOTE — Progress Notes (Signed)
S: Pt comes in today for follow up.  HEADACHE Seen in ED on 11/8 for migraine.  Patient reports she felt like she was having a stroke (mouth and hands were tingling).  Symptoms have resolved since that time.  Feels like propranolol helps significantly with preventing headaches and Excedrin helps if she gets one.    HYPERTENSION BP: 135/94 today; has been checking BP at [facility]- lowest was 117/80, highest 140s/90s Meds: propranolol 40 BID (migraine ppx) Taking meds: Yes     # of doses missed/week: 0 Symptoms: Headache: Yes  Dizziness: No Vision changes: No SOB:  No Chest pain: No LE swelling: No Tobacco use: Yes- 1ppd     ROS: Per HPI  History  Smoking status  . Current Every Day Smoker -- 1.0 packs/day for 11 years  . Types: Cigarettes  Smokeless tobacco  . Never Used    Comment: too much stress    O:  Filed Vitals:   11/24/12 1122  BP: 135/94  Pulse: 69    Gen: NAD, flat affect CV: RRR, no murmur Pulm: CTA bilat, no wheezes or crackles   A/P: 31 y.o. female p/w migraines, HTN -See problem list -f/u in PRN

## 2012-11-24 NOTE — Assessment & Plan Note (Signed)
Diastolic mildly elevated at 94, taking propranolol for migraine ppx. Pt's biggest concern is heart disease. Advised pt that tobacco use is putting her at higher risk for MI/stroke than BP of 135/94. No need to start any other medicines at this time.

## 2012-11-24 NOTE — Assessment & Plan Note (Signed)
Resolved. Cont propranolol.  Consider adding Effexor/SNRI for ppx if migraines increase in frequency given pt appears to have a flat affect and reports large amount of stress.

## 2012-11-24 NOTE — Assessment & Plan Note (Signed)
Encouraged cessation. Smokes 1ppd. Not ready to quit.

## 2012-11-26 ENCOUNTER — Other Ambulatory Visit: Payer: Self-pay | Admitting: Family Medicine

## 2012-12-19 ENCOUNTER — Ambulatory Visit (INDEPENDENT_AMBULATORY_CARE_PROVIDER_SITE_OTHER): Payer: Medicaid Other | Admitting: Family Medicine

## 2012-12-19 ENCOUNTER — Telehealth: Payer: Self-pay | Admitting: Family Medicine

## 2012-12-19 ENCOUNTER — Encounter: Payer: Self-pay | Admitting: Family Medicine

## 2012-12-19 VITALS — BP 122/78 | HR 82 | Temp 98.1°F | Wt 143.0 lb

## 2012-12-19 DIAGNOSIS — H6093 Unspecified otitis externa, bilateral: Secondary | ICD-10-CM

## 2012-12-19 DIAGNOSIS — H60399 Other infective otitis externa, unspecified ear: Secondary | ICD-10-CM

## 2012-12-19 MED ORDER — NEOMYCIN-POLYMYXIN-HC 3.5-10000-1 OT SOLN: 3.0000 [drp] | Freq: Three times a day (TID) | OTIC | Status: AC

## 2012-12-19 MED ORDER — HYDROCODONE-ACETAMINOPHEN 7.5-750 MG PO TABS: 1.0000 | ORAL_TABLET | Freq: Four times a day (QID) | ORAL | Status: DC | PRN

## 2012-12-19 MED ORDER — DOXYCYCLINE HYCLATE 100 MG PO TABS: 100.0000 mg | ORAL_TABLET | Freq: Two times a day (BID) | ORAL | Status: DC

## 2012-12-19 MED ORDER — HYDROCODONE-ACETAMINOPHEN 5-500 MG PO CAPS: 1.0000 | ORAL_CAPSULE | Freq: Four times a day (QID) | ORAL | Status: DC | PRN

## 2012-12-19 NOTE — Patient Instructions (Addendum)
I believe you have both an outer ear infection (otitis externa or swimmers ear) and a middle ear infection.  I am putting you on antibiotics by mouth and antibiotic drops.  Also, I gave you some pain medications.  I know this hurts.

## 2012-12-19 NOTE — Assessment & Plan Note (Signed)
Doxy and cortisporin otic

## 2012-12-19 NOTE — Telephone Encounter (Signed)
Pharmacist has a question about Lorcet HD vs the strength that was sent in. The 5/500 is Vicodin, Lorcet HD is another strength. Dr. Jennette Kettle notified and she entered in RX again and I called to give to pharmacy but pharmacist states the acetaminophen dose is too high and it is recommended now not to give the 750 mg dose . Dr. Jennette Kettle states to give  Hydrocodone /acetaminophen 7.5/325 mg with same  directions and this is called in.

## 2012-12-19 NOTE — Progress Notes (Signed)
  Subjective:    Patient ID: [name], female    DOB: 1981-10-30, 30 y.o.   MRN: [id number]  HPI  Patient with chronic, recurrent ear infections.  Sounds like both otitis media and otitis externa in that she has had antibiotics PO and drops.  States at one point had a ruptured ear drum.  Hospitalized in past and had culture of ear drainage + for MRSA  Now bilateral ear pain.  No fever.  Feels she is catching early.  No water in ears.  Does use q tips.    Review of Systems     Objective:   Physical Exam Both canals mildly injected - had deep cerumen in both irrigated clear.  TMs with dull, blunted architecture.  Mild redness Throat normal Neck no sig adenopathy       Assessment & Plan:

## 2012-12-19 NOTE — Telephone Encounter (Signed)
[facility] on [address] needs to speak to someone for clarification on the Hydrocodone.  The # is [phone].

## 2012-12-29 ENCOUNTER — Other Ambulatory Visit: Payer: Self-pay | Admitting: Family Medicine

## 2013-02-08 ENCOUNTER — Emergency Department (HOSPITAL_COMMUNITY)
Admission: EM | Admit: 2013-02-08 | Discharge: 2013-02-08 | Disposition: A | Discharge status: Home / Self Care | Payer: No Typology Code available for payment source | Attending: Emergency Medicine | Admitting: Emergency Medicine

## 2013-02-08 ENCOUNTER — Encounter (HOSPITAL_COMMUNITY): Payer: Self-pay | Admitting: *Deleted

## 2013-02-08 DIAGNOSIS — Z79899 Other long term (current) drug therapy: Secondary | ICD-10-CM | POA: Insufficient documentation

## 2013-02-08 DIAGNOSIS — Z8639 Personal history of other endocrine, nutritional and metabolic disease: Secondary | ICD-10-CM | POA: Insufficient documentation

## 2013-02-08 DIAGNOSIS — J45909 Unspecified asthma, uncomplicated: Secondary | ICD-10-CM | POA: Insufficient documentation

## 2013-02-08 DIAGNOSIS — IMO0002 Reserved for concepts with insufficient information to code with codable children: Secondary | ICD-10-CM | POA: Insufficient documentation

## 2013-02-08 DIAGNOSIS — Z8614 Personal history of Methicillin resistant Staphylococcus aureus infection: Secondary | ICD-10-CM | POA: Insufficient documentation

## 2013-02-08 DIAGNOSIS — Z8742 Personal history of other diseases of the female genital tract: Secondary | ICD-10-CM | POA: Insufficient documentation

## 2013-02-08 DIAGNOSIS — Z856 Personal history of leukemia: Secondary | ICD-10-CM | POA: Insufficient documentation

## 2013-02-08 DIAGNOSIS — F17200 Nicotine dependence, unspecified, uncomplicated: Secondary | ICD-10-CM | POA: Insufficient documentation

## 2013-02-08 DIAGNOSIS — M4120 Other idiopathic scoliosis, site unspecified: Secondary | ICD-10-CM | POA: Insufficient documentation

## 2013-02-08 DIAGNOSIS — Y9389 Activity, other specified: Secondary | ICD-10-CM | POA: Insufficient documentation

## 2013-02-08 DIAGNOSIS — Y92410 Unspecified street and highway as the place of occurrence of the external cause: Secondary | ICD-10-CM | POA: Insufficient documentation

## 2013-02-08 DIAGNOSIS — M129 Arthropathy, unspecified: Secondary | ICD-10-CM | POA: Insufficient documentation

## 2013-02-08 DIAGNOSIS — M549 Dorsalgia, unspecified: Secondary | ICD-10-CM

## 2013-02-08 DIAGNOSIS — Z872 Personal history of diseases of the skin and subcutaneous tissue: Secondary | ICD-10-CM | POA: Insufficient documentation

## 2013-02-08 DIAGNOSIS — I10 Essential (primary) hypertension: Secondary | ICD-10-CM | POA: Insufficient documentation

## 2013-02-08 DIAGNOSIS — G43909 Migraine, unspecified, not intractable, without status migrainosus: Secondary | ICD-10-CM | POA: Insufficient documentation

## 2013-02-08 DIAGNOSIS — Z8659 Personal history of other mental and behavioral disorders: Secondary | ICD-10-CM | POA: Insufficient documentation

## 2013-02-08 DIAGNOSIS — Z862 Personal history of diseases of the blood and blood-forming organs and certain disorders involving the immune mechanism: Secondary | ICD-10-CM | POA: Insufficient documentation

## 2013-02-08 MED ORDER — DIAZEPAM 5 MG PO TABS: 5.0000 mg | ORAL_TABLET | Freq: Two times a day (BID) | ORAL | Status: DC
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
## 2013-02-09 NOTE — ED Provider Notes (Signed)
Medical screening examination/treatment/procedure(s) were performed by non-physician practitioner and as supervising physician I was immediately available for consultation/collaboration.   Julieta Rogalski, MD 02/09/13 2249 

## 2013-03-25 DIAGNOSIS — G8929 Other chronic pain: Secondary | ICD-10-CM | POA: Insufficient documentation

## 2013-05-11 ENCOUNTER — Emergency Department (HOSPITAL_COMMUNITY)
Admission: EM | Admit: 2013-05-11 | Discharge: 2013-05-11 | Disposition: A | Discharge status: Home / Self Care | Payer: Self-pay | Attending: Emergency Medicine | Admitting: Emergency Medicine

## 2013-05-11 ENCOUNTER — Encounter (HOSPITAL_COMMUNITY): Payer: Self-pay | Admitting: Emergency Medicine

## 2013-05-11 DIAGNOSIS — F909 Attention-deficit hyperactivity disorder, unspecified type: Secondary | ICD-10-CM | POA: Insufficient documentation

## 2013-05-11 DIAGNOSIS — G43909 Migraine, unspecified, not intractable, without status migrainosus: Secondary | ICD-10-CM | POA: Insufficient documentation

## 2013-05-11 DIAGNOSIS — Z8739 Personal history of other diseases of the musculoskeletal system and connective tissue: Secondary | ICD-10-CM | POA: Insufficient documentation

## 2013-05-11 DIAGNOSIS — Z8614 Personal history of Methicillin resistant Staphylococcus aureus infection: Secondary | ICD-10-CM | POA: Insufficient documentation

## 2013-05-11 DIAGNOSIS — J45909 Unspecified asthma, uncomplicated: Secondary | ICD-10-CM | POA: Insufficient documentation

## 2013-05-11 DIAGNOSIS — M129 Arthropathy, unspecified: Secondary | ICD-10-CM | POA: Insufficient documentation

## 2013-05-11 DIAGNOSIS — Z8659 Personal history of other mental and behavioral disorders: Secondary | ICD-10-CM | POA: Insufficient documentation

## 2013-05-11 DIAGNOSIS — F17200 Nicotine dependence, unspecified, uncomplicated: Secondary | ICD-10-CM | POA: Insufficient documentation

## 2013-05-11 DIAGNOSIS — I10 Essential (primary) hypertension: Secondary | ICD-10-CM | POA: Insufficient documentation

## 2013-05-11 DIAGNOSIS — Z872 Personal history of diseases of the skin and subcutaneous tissue: Secondary | ICD-10-CM | POA: Insufficient documentation

## 2013-05-11 DIAGNOSIS — Z862 Personal history of diseases of the blood and blood-forming organs and certain disorders involving the immune mechanism: Secondary | ICD-10-CM | POA: Insufficient documentation

## 2013-05-11 DIAGNOSIS — N764 Abscess of vulva: Secondary | ICD-10-CM | POA: Insufficient documentation

## 2013-05-11 DIAGNOSIS — Z79899 Other long term (current) drug therapy: Secondary | ICD-10-CM | POA: Insufficient documentation

## 2013-05-11 DIAGNOSIS — Z8742 Personal history of other diseases of the female genital tract: Secondary | ICD-10-CM | POA: Insufficient documentation

## 2013-05-11 MED ORDER — HYDROCODONE-ACETAMINOPHEN 5-325 MG PO TABS: 1.0000 | ORAL_TABLET | ORAL | Status: DC | PRN

## 2013-05-11 MED ORDER — IBUPROFEN 200 MG PO TABS
400.0000 mg | ORAL_TABLET | Freq: Once | ORAL | Status: AC
  Administered 2013-05-11: 400 mg via ORAL
  Filled 2013-05-11: qty 2

## 2013-05-11 MED ORDER — CLINDAMYCIN HCL 150 MG PO CAPS: 300.0000 mg | ORAL_CAPSULE | Freq: Three times a day (TID) | ORAL | Status: DC

## 2013-05-11 NOTE — ED Notes (Signed)
Pt. C/o abscess to left labia. Hx of hidradenitis suppurativa.

## 2013-05-11 NOTE — ED Notes (Signed)
Pt with labial abscess x 3 days with pain and swelling

## 2013-05-11 NOTE — ED Provider Notes (Signed)
History     CSN: [id number]  Arrival date & time 05/11/13  1702   None     Chief Complaint  Patient presents with  . Abscess    (Consider location/radiation/quality/duration/timing/severity/associated sxs/prior treatment) HPI History provided by pt.   Pt has a h/o hydradenitis.  Has had an abscess of her Left labia majora x 3 days.  Severely painful.  Has been sitting in a hot tub and it has not drained.  No associated fever.    Past Medical History  Diagnosis Date  . Anemia   . ADHD (attention deficit hyperactivity disorder)   . Back pain   . Scoliosis   . Leukemia at age 32  . Headache   . Endometriosis   . Hydradenitis   . S/P total abdominal hysterectomy 06/26/2011  . Thyroid disease   . MRSA (methicillin resistant staph aureus) culture positive 2010    ear infection that cultured out MRSA   . Hydradenitis 07/13/2011  . Abdominal wall mass   . Arthritis   . Chest pain   . Migraines   . Diarrhea   . Nausea   . Asthma   . Hypertension     on inderal  . Depression     tried several meds, not on any now    Past Surgical History  Procedure Laterality Date  . Laparoscopic endometriosis fulguration  06/1999  . Axillary hidradenitis excision      bilateral  . Cesarean section  08/31/2006, 08/26/2007  . Abdominal hysterectomy  06/30/2008    partial  . Mass excision  06/19/2012    Procedure: EXCISION MASS;  Surgeon: Robyne Askew, MD;  Location: [facility];  Service: General;  Laterality: Right;  excision mass right abdominal wall    Family History  Problem Relation Age of Onset  . Heart disease Mother   . Diabetes Mother   . Hypertension Mother   . Heart disease Father   . Diabetes Father   . Hypertension Father   . Bipolar disorder Sister   . Bipolar disorder Brother   . Cancer Brother     prostate  . Pancreatic cancer Maternal Grandmother   . Cancer Maternal Grandmother     pancreatic  . Cancer Maternal Uncle     lung  . Cancer Paternal  Aunt     History  Substance Use Topics  . Smoking status: Current Every Day Smoker -- 1.00 packs/day for 11 years    Types: Cigarettes  . Smokeless tobacco: Never Used     Comment: too much stress  . Alcohol Use: No    OB History   Grav Para Term Preterm Abortions TAB SAB Ect Mult Living   2 2  2      2       Review of Systems  All other systems reviewed and are negative.    Allergies  Oxycodone-acetaminophen; Percocet; Sulfonamide derivatives; and Morphine and related  Home Medications   Current Outpatient Rx  Name  Route  Sig  Dispense  Refill  . albuterol (PROVENTIL HFA;VENTOLIN HFA) 108 (90 BASE) MCG/ACT inhaler   Inhalation   Inhale 2 puffs into the lungs every 6 (six) hours as needed. Wheezing.         Marland Kitchen aspirin-acetaminophen-caffeine (EXCEDRIN MIGRAINE) 250-250-65 MG per tablet   Oral   Take 1 tablet by mouth every 6 (six) hours as needed. Headache         . diclofenac sodium (VOLTAREN) 1 % GEL  Topical   Apply 2 g topically 4 (four) times daily.         Marland Kitchen HYDROcodone-acetaminophen (VICODIN) 5-500 MG per tablet   Oral   Take 1 tablet by mouth every 6 (six) hours as needed for pain.         Marland Kitchen lisinopril (PRINIVIL,ZESTRIL) 10 MG tablet   Oral   Take 10 mg by mouth every morning.         . meloxicam (MOBIC) 15 MG tablet   Oral   Take 15 mg by mouth every morning.         . propranolol (INDERAL) 40 MG tablet   Oral   Take 40 mg by mouth 2 (two) times daily.           BP 100/70  Pulse 95  Temp(Src) 98.3 F (36.8 C) (Oral)  Resp 18  SpO2 99%  Physical Exam  Nursing note and vitals reviewed. Constitutional: She is oriented to person, place, and time. She appears well-developed and well-nourished. No distress.  HENT:  Head: Normocephalic and atraumatic.  Eyes:  Normal appearance  Neck: Normal range of motion.  Cardiovascular: Normal rate and regular rhythm.   Pulmonary/Chest: Effort normal and breath sounds normal. No  respiratory distress.  Genitourinary:  3cm erythematous and fluctuant abscess on upper left labia majora.  Very ttp.  No inguinal lymphadenopathy.  Musculoskeletal: Normal range of motion.  Neurological: She is alert and oriented to person, place, and time.  Skin: Skin is warm and dry. No rash noted.  Psychiatric: She has a normal mood and affect. Her behavior is normal.    ED Course  Procedures (including critical care time)  INCISION AND DRAINAGE Performed by: Ruby Cola E Consent: Verbal consent obtained. Risks and benefits: risks, benefits and alternatives were discussed Type: abscess  Body area: L labia majora  Anesthesia: local infiltration  Incision was made with a scalpel.  Local anesthetic: lidocaine 2% w/out epinephrine  Anesthetic total: 4 ml  Complexity: complex Blunt dissection to break up loculations  Drainage: purulent  Drainage amount: large  Packing material: none Patient tolerance: Patient tolerated the procedure well with no immediate complications.    Labs Reviewed - No data to display No results found.   1. Abscess of labia majora       MDM  32yo F w/ h/o hydradenitis presents w/ abscess of L labia majora.  I&D'd and d/c'd home w/ clindamycin (pt states the she always receives abx) as well as percocet.  She has a PCP to f/u with.  Return precautions discussed.         Otilio Miu, PA-C 05/12/13 [phone]  Medical screening examination/treatment/procedure(s) were performed by non-physician practitioner and as supervising physician I was immediately available for consultation/collaboration.  Derwood Kaplan, MD 05/18/13 [phone]

## 2013-05-14 ENCOUNTER — Emergency Department (HOSPITAL_COMMUNITY)
Admission: EM | Admit: 2013-05-14 | Discharge: 2013-05-14 | Disposition: A | Discharge status: Home / Self Care | Payer: Self-pay | Attending: Emergency Medicine | Admitting: Emergency Medicine

## 2013-05-14 ENCOUNTER — Encounter (HOSPITAL_COMMUNITY): Payer: Self-pay | Admitting: Emergency Medicine

## 2013-05-14 DIAGNOSIS — Z90710 Acquired absence of both cervix and uterus: Secondary | ICD-10-CM | POA: Insufficient documentation

## 2013-05-14 DIAGNOSIS — R509 Fever, unspecified: Secondary | ICD-10-CM | POA: Insufficient documentation

## 2013-05-14 DIAGNOSIS — N764 Abscess of vulva: Secondary | ICD-10-CM | POA: Insufficient documentation

## 2013-05-14 DIAGNOSIS — F17200 Nicotine dependence, unspecified, uncomplicated: Secondary | ICD-10-CM | POA: Insufficient documentation

## 2013-05-14 DIAGNOSIS — Z79899 Other long term (current) drug therapy: Secondary | ICD-10-CM | POA: Insufficient documentation

## 2013-05-14 DIAGNOSIS — Z862 Personal history of diseases of the blood and blood-forming organs and certain disorders involving the immune mechanism: Secondary | ICD-10-CM | POA: Insufficient documentation

## 2013-05-14 DIAGNOSIS — L732 Hidradenitis suppurativa: Secondary | ICD-10-CM | POA: Insufficient documentation

## 2013-05-14 DIAGNOSIS — Z8679 Personal history of other diseases of the circulatory system: Secondary | ICD-10-CM | POA: Insufficient documentation

## 2013-05-14 DIAGNOSIS — J45909 Unspecified asthma, uncomplicated: Secondary | ICD-10-CM | POA: Insufficient documentation

## 2013-05-14 DIAGNOSIS — M4120 Other idiopathic scoliosis, site unspecified: Secondary | ICD-10-CM | POA: Insufficient documentation

## 2013-05-14 DIAGNOSIS — R5381 Other malaise: Secondary | ICD-10-CM | POA: Insufficient documentation

## 2013-05-14 DIAGNOSIS — Z8742 Personal history of other diseases of the female genital tract: Secondary | ICD-10-CM | POA: Insufficient documentation

## 2013-05-14 DIAGNOSIS — F909 Attention-deficit hyperactivity disorder, unspecified type: Secondary | ICD-10-CM | POA: Insufficient documentation

## 2013-05-14 DIAGNOSIS — R5383 Other fatigue: Secondary | ICD-10-CM | POA: Insufficient documentation

## 2013-05-14 DIAGNOSIS — M129 Arthropathy, unspecified: Secondary | ICD-10-CM | POA: Insufficient documentation

## 2013-05-14 DIAGNOSIS — Z22322 Carrier or suspected carrier of Methicillin resistant Staphylococcus aureus: Secondary | ICD-10-CM | POA: Insufficient documentation

## 2013-05-14 DIAGNOSIS — I10 Essential (primary) hypertension: Secondary | ICD-10-CM | POA: Insufficient documentation

## 2013-05-14 DIAGNOSIS — E079 Disorder of thyroid, unspecified: Secondary | ICD-10-CM | POA: Insufficient documentation

## 2013-05-14 DIAGNOSIS — F329 Major depressive disorder, single episode, unspecified: Secondary | ICD-10-CM | POA: Insufficient documentation

## 2013-05-14 DIAGNOSIS — L02411 Cutaneous abscess of right axilla: Secondary | ICD-10-CM

## 2013-05-14 DIAGNOSIS — IMO0002 Reserved for concepts with insufficient information to code with codable children: Secondary | ICD-10-CM | POA: Insufficient documentation

## 2013-05-14 DIAGNOSIS — F3289 Other specified depressive episodes: Secondary | ICD-10-CM | POA: Insufficient documentation

## 2013-05-14 DIAGNOSIS — R112 Nausea with vomiting, unspecified: Secondary | ICD-10-CM | POA: Insufficient documentation

## 2013-05-14 MED ORDER — HYDROCODONE-ACETAMINOPHEN 5-325 MG PO TABS
2.0000 | ORAL_TABLET | Freq: Once | ORAL | Status: AC
  Administered 2013-05-14: 2 via ORAL
  Filled 2013-05-14: qty 2

## 2013-05-14 MED ORDER — ONDANSETRON HCL 4 MG PO TABS: 4.0000 mg | ORAL_TABLET | Freq: Three times a day (TID) | ORAL | Status: DC | PRN

## 2013-05-14 NOTE — ED Notes (Signed)
Pt presenting to ed with c/o abscess to right arm pt states she had abscess on her vagina x 3 days ago with swelling pt states she is here today for her right arm abscess. Pt states positive nausea and vomiting with chills.

## 2013-05-14 NOTE — ED Provider Notes (Signed)
History    This chart was scribed for non-physician, Glade Nurse PA-C practitioner working with Gilda Crease, * by Donne Anon, ED Scribe. This patient was seen in room WTR6/WTR6 and the patient's care was started at 1645.   CSN: [id number]  Arrival date & time 05/14/13  1631   First MD Initiated Contact with Patient 05/14/13 1645      Chief Complaint  Patient presents with  . Abscess     The history is provided by the patient. No language interpreter was used.   HPI Comments: [name] is a 32 y.o. female with hx of multiple abscesses, MRSA, bilateral axillary hidradenitis excision  and childhood leukemia, who presents to the Emergency Department complaining of gradual onset, constant, gradually worsening right axilla abscess which began 3 days ago. She reports she was seen in the ED 3 days ago on 05/11/13 and diagnosed with an abscess on her vagina. She was prescribed a 7 day prescription of Clindamycin and 20 pills of Vicodin. She reports associated nausea, vomiting, subjective fever, fatigue, and chills. She has tried warm compresses with moderate relief. She reports she was diagnosed with MRSA in 2010 when she had an abscess was in her ear.  Past Medical History  Diagnosis Date  . Anemia   . ADHD (attention deficit hyperactivity disorder)   . Back pain   . Scoliosis   . Leukemia at age 9  . Headache   . Endometriosis   . Hydradenitis   . S/P total abdominal hysterectomy 06/26/2011  . Thyroid disease   . MRSA (methicillin resistant staph aureus) culture positive 2010    ear infection that cultured out MRSA   . Hydradenitis 07/13/2011  . Abdominal wall mass   . Arthritis   . Chest pain   . Migraines   . Diarrhea   . Nausea   . Asthma   . Hypertension     on inderal  . Depression     tried several meds, not on any now    Past Surgical History  Procedure Laterality Date  . Laparoscopic endometriosis fulguration  06/1999  . Axillary hidradenitis  excision      bilateral  . Cesarean section  08/31/2006, 08/26/2007  . Abdominal hysterectomy  06/30/2008    partial  . Mass excision  06/19/2012    Procedure: EXCISION MASS;  Surgeon: Robyne Askew, MD;  Location: [facility];  Service: General;  Laterality: Right;  excision mass right abdominal wall    Family History  Problem Relation Age of Onset  . Heart disease Mother   . Diabetes Mother   . Hypertension Mother   . Heart disease Father   . Diabetes Father   . Hypertension Father   . Bipolar disorder Sister   . Bipolar disorder Brother   . Cancer Brother     prostate  . Pancreatic cancer Maternal Grandmother   . Cancer Maternal Grandmother     pancreatic  . Cancer Maternal Uncle     lung  . Cancer Paternal Aunt     History  Substance Use Topics  . Smoking status: Current Every Day Smoker -- 1.00 packs/day for 11 years    Types: Cigarettes  . Smokeless tobacco: Never Used     Comment: too much stress  . Alcohol Use: No    OB History   Grav Para Term Preterm Abortions TAB SAB Ect Mult Living   2 2  2  2      Review of Systems  Constitutional: Positive for fever, chills and fatigue. Negative for diaphoresis.       Subjective fever  HENT: Negative for neck pain and neck stiffness.   Eyes: Negative for visual disturbance.  Respiratory: Negative for apnea, chest tightness and shortness of breath.   Cardiovascular: Negative for chest pain and palpitations.  Gastrointestinal: Negative for diarrhea and constipation.  Genitourinary: Negative for dysuria and flank pain.  Musculoskeletal: Negative for gait problem.  Skin: Positive for wound.       Healing abscess on vagina. New abscess on right axilla. Red, swollen, painful.   Neurological: Negative for dizziness, weakness, light-headedness and numbness.    Allergies  Oxycodone-acetaminophen; Percocet; Sulfonamide derivatives; and Morphine and related  Home Medications   Current Outpatient Rx   Name  Route  Sig  Dispense  Refill  . albuterol (PROVENTIL HFA;VENTOLIN HFA) 108 (90 BASE) MCG/ACT inhaler   Inhalation   Inhale 2 puffs into the lungs every 6 (six) hours as needed. Wheezing.         Marland Kitchen aspirin-acetaminophen-caffeine (EXCEDRIN MIGRAINE) 250-250-65 MG per tablet   Oral   Take 1 tablet by mouth every 6 (six) hours as needed. Headache         . clindamycin (CLEOCIN) 150 MG capsule   Oral   Take 2 capsules (300 mg total) by mouth 3 (three) times daily.   42 capsule   0   . diclofenac sodium (VOLTAREN) 1 % GEL   Topical   Apply 2 g topically 4 (four) times daily.         Marland Kitchen HYDROcodone-acetaminophen (NORCO/VICODIN) 5-325 MG per tablet   Oral   Take 1 tablet by mouth every 4 (four) hours as needed for pain.   20 tablet   0   . HYDROcodone-acetaminophen (VICODIN) 5-500 MG per tablet   Oral   Take 1 tablet by mouth every 6 (six) hours as needed for pain.         Marland Kitchen lisinopril (PRINIVIL,ZESTRIL) 10 MG tablet   Oral   Take 10 mg by mouth every morning.         . meloxicam (MOBIC) 15 MG tablet   Oral   Take 15 mg by mouth every morning.         . propranolol (INDERAL) 40 MG tablet   Oral   Take 40 mg by mouth 2 (two) times daily.           BP 125/81  Pulse 89  Temp(Src) 99.2 F (37.3 C) (Oral)  Resp 20  SpO2 98%  Physical Exam  Nursing note and vitals reviewed. Constitutional: She is oriented to person, place, and time. She appears well-developed and well-nourished. No distress.  HENT:  Head: Normocephalic and atraumatic.  Eyes: Conjunctivae and EOM are normal.  Neck: Normal range of motion. Neck supple.  No meningeal signs  Cardiovascular: Normal rate, regular rhythm and normal heart sounds.  Exam reveals no gallop and no friction rub.   No murmur heard. Pulmonary/Chest: Effort normal and breath sounds normal. No respiratory distress. She has no wheezes. She has no rales. She exhibits no tenderness.  Abdominal: Soft. Bowel sounds are  normal. She exhibits no distension. There is no tenderness. There is no rebound and no guarding.  Musculoskeletal: Normal range of motion. She exhibits no edema and no tenderness.  Neurological: She is alert and oriented to person, place, and time. No cranial nerve deficit.  Skin: Skin is  warm and dry. She is not diaphoretic. No erythema.  2 cm erythematous, swollen and tender area to right axilla. Amendable to drainage. Healing abscess on left labia majora, no signs of increased infection  Psychiatric: She has a normal mood and affect.    ED Course  Procedures (including critical care time) DIAGNOSTIC STUDIES: Oxygen Saturation is 98% on room air, normal by my interpretation.    COORDINATION OF CARE: 5:01 PMDiscussed treatment plan which includes I&D and antibiotics with pt at bedside and pt agreed to plan. Advised pt of necessity of continuity of care with a PCP. Will provide a PCP resource guide.   5:16 PM INCISION AND DRAINAGE Performed by: Glade Nurse PA-C Consent: Verbal consent obtained. Risks and benefits: risks, benefits and alternatives were discussed Type: abscess  Body area: right axilla  Anesthesia: local infiltration  Incision was made with a scalpel.  Local anesthetic: lidocaine 1% with epinephrine  Anesthetic total: 3 ml  Complexity: complex Blunt dissection to break up loculations  Drainage: purulent  Drainage amount: copious  Packing material: no packing  Patient tolerance: Patient tolerated the procedure well with no immediate complications.   Medications  HYDROcodone-acetaminophen (NORCO/VICODIN) 5-325 MG per tablet 2 tablet (not administered)     Labs Reviewed - No data to display No results found.  Medications  HYDROcodone-acetaminophen (NORCO/VICODIN) 5-325 MG per tablet 2 tablet (not administered)    New Prescriptions   ONDANSETRON (ZOFRAN) 4 MG TABLET    Take 1 tablet (4 mg total) by mouth every 8 (eight) hours as needed for nausea.     1. Abscess of axilla, right       MDM  31yo F w/ h/o hydradenitis presents w/ abscess of right axilla amenable to incision and drainage. Pt was already on clindamycin after a visit on 05/11/13 for abscess to her vagina. Discussed additional coverage with Dr. Blinda Leatherwood who suggested Bactrim, but pt has sulfa allergy. Decision was made that converge was adequate. Pt was given a script for percocet on that date as well receiving 20 pills. Controlled pt pain in the ED, discussed importance of follow up as well as return precautions. Pt understands diagnosis, importance of follow up (she is re-filing her medicaid), and agrees with discharge.    I personally performed the services described in this documentation, which was scribed in my presence. The recorded information has been reviewed and is accurate.    Glade Nurse, PA-C 05/14/13 1806

## 2013-05-14 NOTE — ED Provider Notes (Signed)
Medical screening examination/treatment/procedure(s) were performed by non-physician practitioner and as supervising physician I was immediately available for consultation/collaboration.  Gilda Crease, MD 05/14/13 [phone]

## 2013-06-12 ENCOUNTER — Encounter (HOSPITAL_COMMUNITY): Payer: Self-pay | Admitting: Emergency Medicine

## 2013-06-12 ENCOUNTER — Emergency Department (HOSPITAL_COMMUNITY)
Admission: EM | Admit: 2013-06-12 | Discharge: 2013-06-12 | Disposition: A | Discharge status: Home / Self Care | Payer: Self-pay | Attending: Emergency Medicine | Admitting: Emergency Medicine

## 2013-06-12 DIAGNOSIS — I10 Essential (primary) hypertension: Secondary | ICD-10-CM | POA: Insufficient documentation

## 2013-06-12 DIAGNOSIS — Z79899 Other long term (current) drug therapy: Secondary | ICD-10-CM | POA: Insufficient documentation

## 2013-06-12 DIAGNOSIS — F329 Major depressive disorder, single episode, unspecified: Secondary | ICD-10-CM | POA: Insufficient documentation

## 2013-06-12 DIAGNOSIS — Z856 Personal history of leukemia: Secondary | ICD-10-CM | POA: Insufficient documentation

## 2013-06-12 DIAGNOSIS — Z90710 Acquired absence of both cervix and uterus: Secondary | ICD-10-CM | POA: Insufficient documentation

## 2013-06-12 DIAGNOSIS — Y939 Activity, unspecified: Secondary | ICD-10-CM | POA: Insufficient documentation

## 2013-06-12 DIAGNOSIS — X500XXA Overexertion from strenuous movement or load, initial encounter: Secondary | ICD-10-CM | POA: Insufficient documentation

## 2013-06-12 DIAGNOSIS — M542 Cervicalgia: Secondary | ICD-10-CM

## 2013-06-12 DIAGNOSIS — Z8614 Personal history of Methicillin resistant Staphylococcus aureus infection: Secondary | ICD-10-CM | POA: Insufficient documentation

## 2013-06-12 DIAGNOSIS — J45909 Unspecified asthma, uncomplicated: Secondary | ICD-10-CM | POA: Insufficient documentation

## 2013-06-12 DIAGNOSIS — Y929 Unspecified place or not applicable: Secondary | ICD-10-CM | POA: Insufficient documentation

## 2013-06-12 DIAGNOSIS — Z8639 Personal history of other endocrine, nutritional and metabolic disease: Secondary | ICD-10-CM | POA: Insufficient documentation

## 2013-06-12 DIAGNOSIS — G43909 Migraine, unspecified, not intractable, without status migrainosus: Secondary | ICD-10-CM | POA: Insufficient documentation

## 2013-06-12 DIAGNOSIS — Z862 Personal history of diseases of the blood and blood-forming organs and certain disorders involving the immune mechanism: Secondary | ICD-10-CM | POA: Insufficient documentation

## 2013-06-12 DIAGNOSIS — Z8659 Personal history of other mental and behavioral disorders: Secondary | ICD-10-CM | POA: Insufficient documentation

## 2013-06-12 DIAGNOSIS — F3289 Other specified depressive episodes: Secondary | ICD-10-CM | POA: Insufficient documentation

## 2013-06-12 DIAGNOSIS — Z8739 Personal history of other diseases of the musculoskeletal system and connective tissue: Secondary | ICD-10-CM | POA: Insufficient documentation

## 2013-06-12 DIAGNOSIS — Z872 Personal history of diseases of the skin and subcutaneous tissue: Secondary | ICD-10-CM | POA: Insufficient documentation

## 2013-06-12 DIAGNOSIS — F17200 Nicotine dependence, unspecified, uncomplicated: Secondary | ICD-10-CM | POA: Insufficient documentation

## 2013-06-12 DIAGNOSIS — S0993XA Unspecified injury of face, initial encounter: Secondary | ICD-10-CM | POA: Insufficient documentation

## 2013-06-12 DIAGNOSIS — Z8742 Personal history of other diseases of the female genital tract: Secondary | ICD-10-CM | POA: Insufficient documentation

## 2013-06-12 MED ORDER — CYCLOBENZAPRINE HCL 5 MG PO TABS: 5.0000 mg | ORAL_TABLET | Freq: Two times a day (BID) | ORAL | Status: DC | PRN

## 2013-06-12 MED ORDER — KETOROLAC TROMETHAMINE 30 MG/ML IJ SOLN
60.0000 mg | Freq: Once | INTRAMUSCULAR | Status: AC
  Administered 2013-06-12: 60 mg via INTRAMUSCULAR
  Filled 2013-06-12: qty 2

## 2013-06-12 MED ORDER — HYDROCODONE-ACETAMINOPHEN 5-325 MG PO TABS: 1.0000 | ORAL_TABLET | ORAL | Status: DC | PRN

## 2013-06-12 NOTE — ED Notes (Signed)
Per pt, woke up with neck pain/stiffness 3 weeks ago-increased pain-no relief with OTC meds, heat,etc-

## 2013-06-12 NOTE — ED Provider Notes (Signed)
History     CSN: [id number]  Arrival date & time 06/12/13  0272   First MD Initiated Contact with Patient 06/12/13 [phone]      Chief Complaint  Patient presents with  . 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 female presenting with neck pain. The history is provided by the patient. No language interpreter was used.  Neck Pain Pain location:  L side Quality:  Aching Pain radiates to:  Does not radiate Pain severity:  Moderate Pain is:  Same all the time Onset quality:  Unable to specify Duration:  3 weeks Timing:  Constant Progression:  Unchanged Context: lifting a heavy object   Context: not fall   Relieved by:  Nothing Worsened by:  Position and twisting Ineffective treatments:  Heat, ice and NSAIDs Associated symptoms: no fever, no tingling and no weakness     Past Medical History  Diagnosis Date  . Anemia   . ADHD (attention deficit hyperactivity disorder)   . Back pain   . Scoliosis   . Leukemia at age 38  . Headache(784.0)   . Endometriosis   . Hydradenitis   . S/P total abdominal hysterectomy 06/26/2011  . Thyroid disease   . MRSA (methicillin resistant staph aureus) culture positive 2010    ear infection that cultured out MRSA   . Hydradenitis 07/13/2011  . Abdominal wall mass   . Arthritis   . Chest pain   . Migraines   . Diarrhea   . Nausea   . Asthma   . Hypertension     on inderal  . Depression     tried several meds, not on any now    Past Surgical History  Procedure Laterality Date  . Laparoscopic endometriosis fulguration   06/1999  . Axillary hidradenitis excision      bilateral  . Cesarean section  08/31/2006, 08/26/2007  . Abdominal hysterectomy  06/30/2008    partial  . Mass excision  06/19/2012    Procedure: EXCISION MASS;  Surgeon: Robyne Askew, MD;  Location: [facility];  Service: General;  Laterality: Right;  excision mass right abdominal wall    Family History  Problem Relation Age of Onset  . Heart disease Mother   . Diabetes Mother   . Hypertension Mother   . Heart disease Father   . Diabetes Father   . Hypertension Father   . Bipolar disorder Sister   . Bipolar disorder Brother   . Cancer Brother     prostate  . Pancreatic cancer Maternal Grandmother   . Cancer Maternal Grandmother     pancreatic  . Cancer Maternal Uncle     lung  . Cancer Paternal Aunt     History  Substance Use Topics  . Smoking status: Current Every Day Smoker -- 1.00 packs/day for 11 years    Types: Cigarettes  . Smokeless tobacco: Never Used     Comment: too much stress  . Alcohol Use: No    OB History   Grav Para Term Preterm Abortions TAB SAB Ect Mult Living  2 2  2      2       Review of Systems  Constitutional: Negative for fever.  HENT: Positive for neck pain.   Respiratory: Negative.   Cardiovascular: Negative.   Neurological: Negative for tingling and weakness.    Allergies  Oxycodone-acetaminophen; Percocet; Sulfonamide derivatives; and Morphine and related  Home Medications   Current Outpatient Rx  Name  Route  Sig  Dispense  Refill  . albuterol (PROVENTIL HFA;VENTOLIN HFA) 108 (90 BASE) MCG/ACT inhaler   Inhalation   Inhale 2 puffs into the lungs every 6 (six) hours as needed. Wheezing.         Marland Kitchen aspirin-acetaminophen-caffeine (EXCEDRIN MIGRAINE) 250-250-65 MG per tablet   Oral   Take 1 tablet by mouth every 6 (six) hours as needed. Headache         . clindamycin (CLEOCIN) 150 MG capsule   Oral   Take 2 capsules (300 mg total) by mouth 3 (three) times  daily.   42 capsule   0   . diclofenac sodium (VOLTAREN) 1 % GEL   Topical   Apply 2 g topically 4 (four) times daily.         Marland Kitchen HYDROcodone-acetaminophen (NORCO/VICODIN) 5-325 MG per tablet   Oral   Take 1 tablet by mouth every 4 (four) hours as needed for pain.   20 tablet   0   . HYDROcodone-acetaminophen (VICODIN) 5-500 MG per tablet   Oral   Take 1 tablet by mouth every 6 (six) hours as needed for pain.         Marland Kitchen lisinopril (PRINIVIL,ZESTRIL) 10 MG tablet   Oral   Take 10 mg by mouth every morning.         . meloxicam (MOBIC) 15 MG tablet   Oral   Take 15 mg by mouth every morning.         . ondansetron (ZOFRAN) 4 MG tablet   Oral   Take 1 tablet (4 mg total) by mouth every 8 (eight) hours as needed for nausea.   10 tablet   0   . propranolol (INDERAL) 40 MG tablet   Oral   Take 40 mg by mouth 2 (two) times daily.           BP 127/90  Pulse 93  Temp(Src) 99.3 F (37.4 C) (Oral)  Resp 16  Ht 5\' 1"  (1.549 m)  Wt 143 lb (64.864 kg)  BMI 27.03 kg/m2  SpO2 100%  Physical Exam  Nursing note and vitals reviewed. Constitutional: She is oriented to person, place, and time. She appears well-developed and well-nourished.  Eyes: Conjunctivae and EOM are normal.  Neck: Normal range of motion. Neck supple.  Cardiovascular: Normal rate and regular rhythm.   Pulmonary/Chest: Effort normal and breath sounds normal.  Musculoskeletal:  Left cervical paraspinal tenderness:pt has full ZOX:WRUEA grip strength  Neurological: She is alert and oriented to person, place, and time. Coordination normal.  Skin: Skin is warm and dry.  Psychiatric: She has a normal mood and affect.    ED Course  Procedures (including critical care time)  Labs Reviewed - No data to display No results found.   1. Neck pain       MDM  Pt not having any neuro deficits:likely strain:doubt meningeal symptoms:will treat symptomatically for the headache        Teressa Lower, NP 06/12/13 1041

## 2013-06-12 NOTE — Progress Notes (Signed)
[facility] has seen patient and provided her with a oc application and a list of primary care resources. °

## 2013-06-12 NOTE — ED Provider Notes (Signed)
  Medical screening examination/treatment/procedure(s) were performed by non-physician practitioner and as supervising physician I was immediately available for consultation/collaboration.   Delfina Schreurs, MD 06/12/13 1630 

## 2013-08-24 ENCOUNTER — Emergency Department (HOSPITAL_COMMUNITY)
Admission: EM | Admit: 2013-08-24 | Discharge: 2013-08-24 | Disposition: A | Discharge status: Home / Self Care | Payer: Self-pay | Attending: Emergency Medicine | Admitting: Emergency Medicine

## 2013-08-24 ENCOUNTER — Encounter (HOSPITAL_COMMUNITY): Payer: Self-pay | Admitting: Emergency Medicine

## 2013-08-24 ENCOUNTER — Emergency Department (HOSPITAL_COMMUNITY): Payer: Self-pay

## 2013-08-24 DIAGNOSIS — J45909 Unspecified asthma, uncomplicated: Secondary | ICD-10-CM | POA: Insufficient documentation

## 2013-08-24 DIAGNOSIS — Z8639 Personal history of other endocrine, nutritional and metabolic disease: Secondary | ICD-10-CM | POA: Insufficient documentation

## 2013-08-24 DIAGNOSIS — F3289 Other specified depressive episodes: Secondary | ICD-10-CM | POA: Insufficient documentation

## 2013-08-24 DIAGNOSIS — N83209 Unspecified ovarian cyst, unspecified side: Secondary | ICD-10-CM | POA: Insufficient documentation

## 2013-08-24 DIAGNOSIS — Z862 Personal history of diseases of the blood and blood-forming organs and certain disorders involving the immune mechanism: Secondary | ICD-10-CM | POA: Insufficient documentation

## 2013-08-24 DIAGNOSIS — F17200 Nicotine dependence, unspecified, uncomplicated: Secondary | ICD-10-CM | POA: Insufficient documentation

## 2013-08-24 DIAGNOSIS — Z8739 Personal history of other diseases of the musculoskeletal system and connective tissue: Secondary | ICD-10-CM | POA: Insufficient documentation

## 2013-08-24 DIAGNOSIS — Z8742 Personal history of other diseases of the female genital tract: Secondary | ICD-10-CM | POA: Insufficient documentation

## 2013-08-24 DIAGNOSIS — Z872 Personal history of diseases of the skin and subcutaneous tissue: Secondary | ICD-10-CM | POA: Insufficient documentation

## 2013-08-24 DIAGNOSIS — Z791 Long term (current) use of non-steroidal anti-inflammatories (NSAID): Secondary | ICD-10-CM | POA: Insufficient documentation

## 2013-08-24 DIAGNOSIS — I10 Essential (primary) hypertension: Secondary | ICD-10-CM | POA: Insufficient documentation

## 2013-08-24 DIAGNOSIS — Z79899 Other long term (current) drug therapy: Secondary | ICD-10-CM | POA: Insufficient documentation

## 2013-08-24 DIAGNOSIS — Z8719 Personal history of other diseases of the digestive system: Secondary | ICD-10-CM | POA: Insufficient documentation

## 2013-08-24 DIAGNOSIS — Z856 Personal history of leukemia: Secondary | ICD-10-CM | POA: Insufficient documentation

## 2013-08-24 DIAGNOSIS — Z8679 Personal history of other diseases of the circulatory system: Secondary | ICD-10-CM | POA: Insufficient documentation

## 2013-08-24 DIAGNOSIS — Z8659 Personal history of other mental and behavioral disorders: Secondary | ICD-10-CM | POA: Insufficient documentation

## 2013-08-24 DIAGNOSIS — Z90710 Acquired absence of both cervix and uterus: Secondary | ICD-10-CM | POA: Insufficient documentation

## 2013-08-24 DIAGNOSIS — Z8614 Personal history of Methicillin resistant Staphylococcus aureus infection: Secondary | ICD-10-CM | POA: Insufficient documentation

## 2013-08-24 DIAGNOSIS — F329 Major depressive disorder, single episode, unspecified: Secondary | ICD-10-CM | POA: Insufficient documentation

## 2013-08-24 DIAGNOSIS — M129 Arthropathy, unspecified: Secondary | ICD-10-CM | POA: Insufficient documentation

## 2013-08-24 LAB — CBC WITH DIFFERENTIAL/PLATELET
Basophils Absolute: 0.1 10*3/uL (ref 0.0–0.1)
Basophils Relative: 1 % (ref 0–1)
Eosinophils Absolute: 0.2 10*3/uL (ref 0.0–0.7)
Eosinophils Relative: 2 % (ref 0–5)
HCT: 44.3 % (ref 36.0–46.0)
Hemoglobin: 15 g/dL (ref 12.0–15.0)
Lymphocytes Relative: 30 % (ref 12–46)
Lymphs Abs: 2.3 10*3/uL (ref 0.7–4.0)
MCH: 28.8 pg (ref 26.0–34.0)
MCHC: 33.9 g/dL (ref 30.0–36.0)
MCV: 85.2 fL (ref 78.0–100.0)
Monocytes Absolute: 0.6 10*3/uL (ref 0.1–1.0)
Monocytes Relative: 7 % (ref 3–12)
Neutro Abs: 4.5 10*3/uL (ref 1.7–7.7)
Neutrophils Relative %: 60 % (ref 43–77)
Platelets: 194 10*3/uL (ref 150–400)
RBC: 5.2 MIL/uL — ABNORMAL HIGH (ref 3.87–5.11)
RDW: 12.9 % (ref 11.5–15.5)
WBC: 7.6 10*3/uL (ref 4.0–10.5)

## 2013-08-24 LAB — BASIC METABOLIC PANEL WITH GFR
BUN: 11 mg/dL (ref 6–23)
CO2: 24 meq/L (ref 19–32)
Calcium: 9.1 mg/dL (ref 8.4–10.5)
Chloride: 103 meq/L (ref 96–112)
Creatinine, Ser: 0.6 mg/dL (ref 0.50–1.10)
GFR calc Af Amer: >90 mL/min
GFR calc non Af Amer: >90 mL/min
Glucose, Bld: 93 mg/dL (ref 70–99)
Potassium: 4.2 meq/L (ref 3.5–5.1)
Sodium: 136 meq/L (ref 135–145)

## 2013-08-24 LAB — URINALYSIS, ROUTINE W REFLEX MICROSCOPIC
Bilirubin Urine: NEGATIVE
Glucose, UA: NEGATIVE mg/dL
Hgb urine dipstick: NEGATIVE
Ketones, ur: NEGATIVE mg/dL
Leukocytes, UA: NEGATIVE
Nitrite: NEGATIVE
Protein, ur: NEGATIVE mg/dL
Specific Gravity, Urine: 1.017 (ref 1.005–1.030)
Urobilinogen, UA: 1 mg/dL (ref 0.0–1.0)
pH: 6.5 (ref 5.0–8.0)

## 2013-08-24 MED ORDER — HYDROCODONE-ACETAMINOPHEN 5-325 MG PO TABS: 2.0000 | ORAL_TABLET | ORAL | Status: DC | PRN

## 2013-08-24 MED ORDER — IOHEXOL 300 MG/ML  SOLN
50.0000 mL | Freq: Once | INTRAMUSCULAR | Status: AC | PRN
  Administered 2013-08-24: 50 mL via ORAL

## 2013-08-24 MED ORDER — IOHEXOL 300 MG/ML  SOLN
100.0000 mL | Freq: Once | INTRAMUSCULAR | Status: AC | PRN
  Administered 2013-08-24: 100 mL via INTRAVENOUS

## 2013-08-24 NOTE — ED Provider Notes (Signed)
CSN: [id number]     Arrival date & time 08/24/13  1202 History     First MD Initiated Contact with Patient 08/24/13 1248     Chief Complaint  Patient presents with  . Pelvic Pain   (Consider location/radiation/quality/duration/timing/severity/associated sxs/prior Treatment) HPI Comments: Patient presents to the ER for evaluation of left-sided pelvic pain. Symptoms have been present for several days. Patient reports that the pain is now constant, sharp moderate to severe. Pain worsens with attempting to have a bowel movement and intercourse.  Patient is a 32 y.o. female presenting with pelvic pain.  Pelvic Pain    Past Medical History  Diagnosis Date  . Anemia   . ADHD (attention deficit hyperactivity disorder)   . Back pain   . Scoliosis   . Leukemia at age 69  . Headache(784.0)   . Endometriosis   . Hydradenitis   . S/P total abdominal hysterectomy 06/26/2011  . Thyroid disease   . MRSA (methicillin resistant staph aureus) culture positive 2010    ear infection that cultured out MRSA   . Hydradenitis 07/13/2011  . Abdominal wall mass   . Arthritis   . Chest pain   . Migraines   . Diarrhea   . Nausea   . Asthma   . Hypertension     on inderal  . Depression     tried several meds, not on any now   Past Surgical History  Procedure Laterality Date  . Laparoscopic endometriosis fulguration  06/1999  . Axillary hidradenitis excision      bilateral  . Cesarean section  08/31/2006, 08/26/2007  . Abdominal hysterectomy  06/30/2008    partial  . Mass excision  06/19/2012    Procedure: EXCISION MASS;  Surgeon: Robyne Askew, MD;  Location: [facility];  Service: General;  Laterality: Right;  excision mass right abdominal wall   Family History  Problem Relation Age of Onset  . Heart disease Mother   . Diabetes Mother   . Hypertension Mother   . Heart disease Father   . Diabetes Father   . Hypertension Father   . Bipolar disorder Sister   . Bipolar disorder  Brother   . Cancer Brother     prostate  . Pancreatic cancer Maternal Grandmother   . Cancer Maternal Grandmother     pancreatic  . Cancer Maternal Uncle     lung  . Cancer Paternal Aunt    History  Substance Use Topics  . Smoking status: Current Every Day Smoker -- 1.00 packs/day for 11 years    Types: Cigarettes  . Smokeless tobacco: Never Used     Comment: too much stress  . Alcohol Use: No   OB History   Grav Para Term Preterm Abortions TAB SAB Ect Mult Living   2 2  2      2      Review of Systems  Genitourinary: Positive for pelvic pain.    Allergies  Oxycodone-acetaminophen; Percocet; Sulfonamide derivatives; and Morphine and related  Home Medications   Current Outpatient Rx  Name  Route  Sig  Dispense  Refill  . albuterol (PROVENTIL HFA;VENTOLIN HFA) 108 (90 BASE) MCG/ACT inhaler   Inhalation   Inhale 2 puffs into the lungs every 6 (six) hours as needed. Wheezing.         . diclofenac sodium (VOLTAREN) 1 % GEL   Topical   Apply 2 g topically 4 (four) times daily.         Marland Kitchen  ibuprofen (ADVIL,MOTRIN) 200 MG tablet   Oral   Take 400 mg by mouth every 6 (six) hours as needed for pain.         Marland Kitchen lisinopril (PRINIVIL,ZESTRIL) 10 MG tablet   Oral   Take 10 mg by mouth every morning.         . propranolol (INDERAL) 40 MG tablet   Oral   Take 40 mg by mouth 2 (two) times daily.          BP 103/76  Pulse 76  Temp(Src) 98.7 F (37.1 C) (Oral)  Resp 14  Ht 5\' 1"  (1.549 m)  Wt 145 lb (65.772 kg)  BMI 27.41 kg/m2  SpO2 100% Physical Exam  Abdominal: Hernia confirmed negative in the right inguinal area and confirmed negative in the left inguinal area.  Genitourinary: Vagina normal. There is no rash or tenderness on the right labia. There is no rash or tenderness on the left labia. Right adnexum displays no mass. Left adnexum displays tenderness. Left adnexum displays no mass.    ED Course   Procedures (including critical care time)  Labs  Reviewed  CBC WITH DIFFERENTIAL - Abnormal; Notable for the following:    RBC 5.20 (*)    All other components within normal limits  URINALYSIS, ROUTINE W REFLEX MICROSCOPIC  BASIC METABOLIC PANEL   Ct Abdomen Pelvis W Contrast  08/24/2013   *RADIOLOGY REPORT*  Clinical Data: Left sided pelvic pain.  3N urination with pressure and discomfort while voiding.  CT ABDOMEN AND PELVIS WITH CONTRAST  Technique:  Multidetector CT imaging of the abdomen and pelvis was performed following the standard protocol during bolus administration of intravenous contrast.  Contrast: 50mL OMNIPAQUE IOHEXOL 300 MG/ML  SOLN, OMNIPAQUE IOHEXOL 300 MG/ML  SOLN  Comparison: 03/22/2012.  Findings: Lung bases show chronic-appearing opacities in the lower lobes.  Heart size normal.  No pericardial or pleural effusion.  Liver and gallbladder are unremarkable.  Probable 1.0 x 1.2 cm low attenuation nodule in the right adrenal gland, unchanged from 03/22/2012.  Left adrenal glands unremarkable.  A 3.4 cm low attenuation lesion in the upper pole right kidney has decreased in size slightly from 3.7 cm.  Kidneys, spleen, pancreas, stomach and bowel are otherwise unremarkable.  Low attenuation lesion in the left adnexa appears to arise from the left ovary, measuring 3.9 cm.  Right ovary is visualized.  No pathologically enlarged lymph nodes.  Circumaortic left renal vein. No free fluid.  No worrisome lytic or sclerotic lesions.  IMPRESSION:  1.  Low attenuation left adnexal lesion likely arises from the left ovary and may represent a physiologic cyst.  No associated free fluid to indicate rupture. 2.  Probable low attenuation lesion in the right adrenal gland, unchanged from 03/22/2012, favoring an adenoma.   Original Report Authenticated By: Leanna Battles, M.D.   Diagnosis: Pelvic Pain; Ovarian Cyst  MDM  Patient presents to the ER with complaints of left sided pelvic pain. Patient reports continuous pain that worsens with  intercourse and bowel movements. Examination revealed mild tenderness in the left lower abdomen pelvic region. Pelvic exam revealed no adnexal masses but there is tenderness in the region of the adnexa. Because of the fact that she has had a hysterectomy, PID is not considered. CAT scan was performed to evaluate for proctitis, colitis, diverticulitis as well as ovary. CAT scan shows evidence of ovarian cyst without any other pathology. Patient will be treated with analgesia and followup with OB/GYN.  Canary Brim.  Blinda Leatherwood, MD 08/24/13 [phone]

## 2013-08-24 NOTE — Progress Notes (Signed)
[facility] provided patient with a [facility] [employer] Card application.  °

## 2013-08-24 NOTE — ED Notes (Addendum)
Pt reports 8/10 left sided pelvic pain that started five days ago. Pt denies vaginal bleeding or discharge. Pt states the pain has progressively gotten worse and feels pressure when she tries to void or cough. Pt reports frequent urination with pressure and discomfort while voiding.  Pt reports having a partial hysterectomy. Pt denies N/V/D.

## 2013-10-07 ENCOUNTER — Emergency Department (HOSPITAL_COMMUNITY): Payer: Medicaid Other

## 2013-10-07 ENCOUNTER — Emergency Department (HOSPITAL_COMMUNITY)
Admission: EM | Admit: 2013-10-07 | Discharge: 2013-10-07 | Disposition: A | Discharge status: Home / Self Care | Payer: Medicaid Other | Attending: Emergency Medicine | Admitting: Emergency Medicine

## 2013-10-07 ENCOUNTER — Encounter (HOSPITAL_COMMUNITY): Payer: Self-pay | Admitting: Emergency Medicine

## 2013-10-07 DIAGNOSIS — Z8639 Personal history of other endocrine, nutritional and metabolic disease: Secondary | ICD-10-CM | POA: Insufficient documentation

## 2013-10-07 DIAGNOSIS — J069 Acute upper respiratory infection, unspecified: Secondary | ICD-10-CM | POA: Diagnosis present

## 2013-10-07 DIAGNOSIS — R1110 Vomiting, unspecified: Secondary | ICD-10-CM | POA: Insufficient documentation

## 2013-10-07 DIAGNOSIS — Z862 Personal history of diseases of the blood and blood-forming organs and certain disorders involving the immune mechanism: Secondary | ICD-10-CM | POA: Insufficient documentation

## 2013-10-07 DIAGNOSIS — Z79899 Other long term (current) drug therapy: Secondary | ICD-10-CM | POA: Insufficient documentation

## 2013-10-07 DIAGNOSIS — Z8614 Personal history of Methicillin resistant Staphylococcus aureus infection: Secondary | ICD-10-CM | POA: Insufficient documentation

## 2013-10-07 DIAGNOSIS — Z8742 Personal history of other diseases of the female genital tract: Secondary | ICD-10-CM | POA: Insufficient documentation

## 2013-10-07 DIAGNOSIS — I10 Essential (primary) hypertension: Secondary | ICD-10-CM | POA: Insufficient documentation

## 2013-10-07 DIAGNOSIS — Z8659 Personal history of other mental and behavioral disorders: Secondary | ICD-10-CM | POA: Insufficient documentation

## 2013-10-07 DIAGNOSIS — J45901 Unspecified asthma with (acute) exacerbation: Secondary | ICD-10-CM | POA: Insufficient documentation

## 2013-10-07 DIAGNOSIS — F17200 Nicotine dependence, unspecified, uncomplicated: Secondary | ICD-10-CM | POA: Insufficient documentation

## 2013-10-07 DIAGNOSIS — Z8739 Personal history of other diseases of the musculoskeletal system and connective tissue: Secondary | ICD-10-CM | POA: Insufficient documentation

## 2013-10-07 MED ORDER — AZITHROMYCIN 250 MG PO TABS: ORAL_TABLET | ORAL | Status: DC

## 2013-10-07 NOTE — ED Notes (Signed)
Pt c/o cough, fever, Shob x 4 weeks, pt has been treating with OTC treatments. A & O.

## 2013-10-07 NOTE — ED Provider Notes (Signed)
CSN: [id number]     Arrival date & time 10/07/13  1913 History   First MD Initiated Contact with Patient 10/07/13 2109     Chief Complaint  Patient presents with  . Cough  . Fever  . Wheezing   (Consider location/radiation/quality/duration/timing/severity/associated sxs/prior Treatment) Patient is a 32 y.o. female presenting with cough, fever, and wheezing.  Cough Cough characteristics:  Productive Sputum characteristics:  Yellow Severity:  Moderate Onset quality:  Gradual Duration:  4 weeks Timing:  Constant Progression:  Unchanged Chronicity:  New Smoker: yes   Context: sick contacts   Relieved by:  Nothing Worsened by:  Nothing tried Ineffective treatments:  None tried Associated symptoms: fever, shortness of breath and wheezing   Associated symptoms: no chest pain and no headaches   Fever Associated symptoms: cough and vomiting (post tussive)   Associated symptoms: no chest pain, no congestion, no diarrhea, no dysuria, no headaches and no nausea   Wheezing Associated symptoms: cough, fever and shortness of breath   Associated symptoms: no chest pain, no fatigue and no headaches     Past Medical History  Diagnosis Date  . Anemia   . ADHD (attention deficit hyperactivity disorder)   . Back pain   . Scoliosis   . Leukemia at age 7  . Headache(784.0)   . Endometriosis   . Hydradenitis   . S/P total abdominal hysterectomy 06/26/2011  . Thyroid disease   . MRSA (methicillin resistant staph aureus) culture positive 2010    ear infection that cultured out MRSA   . Hydradenitis 07/13/2011  . Abdominal wall mass   . Arthritis   . Chest pain   . Migraines   . Diarrhea   . Nausea   . Asthma   . Hypertension     on inderal  . Depression     tried several meds, not on any now   Past Surgical History  Procedure Laterality Date  . Laparoscopic endometriosis fulguration  06/1999  . Axillary hidradenitis excision      bilateral  . Cesarean section  08/31/2006,  08/26/2007  . Abdominal hysterectomy  06/30/2008    partial  . Mass excision  06/19/2012    Procedure: EXCISION MASS;  Surgeon: Robyne Askew, MD;  Location: [facility];  Service: General;  Laterality: Right;  excision mass right abdominal wall   Family History  Problem Relation Age of Onset  . Heart disease Mother   . Diabetes Mother   . Hypertension Mother   . Heart disease Father   . Diabetes Father   . Hypertension Father   . Bipolar disorder Sister   . Bipolar disorder Brother   . Cancer Brother     prostate  . Pancreatic cancer Maternal Grandmother   . Cancer Maternal Grandmother     pancreatic  . Cancer Maternal Uncle     lung  . Cancer Paternal Aunt    History  Substance Use Topics  . Smoking status: Current Every Day Smoker -- 1.00 packs/day for 11 years    Types: Cigarettes  . Smokeless tobacco: Never Used     Comment: too much stress  . Alcohol Use: No   OB History   Grav Para Term Preterm Abortions TAB SAB Ect Mult Living   2 2  2      2      Review of Systems  Constitutional: Positive for fever. Negative for fatigue.  HENT: Negative for congestion and drooling.   Eyes: Negative for  pain.  Respiratory: Positive for cough, shortness of breath and wheezing.   Cardiovascular: Negative for chest pain.  Gastrointestinal: Positive for vomiting (post tussive). Negative for nausea, abdominal pain and diarrhea.  Genitourinary: Negative for dysuria and hematuria.  Musculoskeletal: Negative for back pain, gait problem and neck pain.  Skin: Negative for color change.  Neurological: Negative for dizziness and headaches.  Hematological: Negative for adenopathy.  Psychiatric/Behavioral: Negative for behavioral problems.  All other systems reviewed and are negative.    Allergies  Oxycodone-acetaminophen; Percocet; Sulfonamide derivatives; and Morphine and related  Home Medications   Current Outpatient Rx  Name  Route  Sig  Dispense  Refill  .  albuterol (PROVENTIL HFA;VENTOLIN HFA) 108 (90 BASE) MCG/ACT inhaler   Inhalation   Inhale 2 puffs into the lungs every 6 (six) hours as needed. Wheezing.         Marland Kitchen aspirin-acetaminophen-caffeine (EXCEDRIN MIGRAINE) 250-250-65 MG per tablet   Oral   Take 1 tablet by mouth every 6 (six) hours as needed for pain (pain).         Marland Kitchen lisinopril (PRINIVIL,ZESTRIL) 10 MG tablet   Oral   Take 10 mg by mouth every morning.         . propranolol (INDERAL) 40 MG tablet   Oral   Take 40 mg by mouth 2 (two) times daily.         Marland Kitchen azithromycin (ZITHROMAX Z-PAK) 250 MG tablet      2 po day one, then 1 daily x 4 days   6 tablet   0    BP 124/84  Pulse 72  Temp(Src) 98.5 F (36.9 C) (Oral)  Resp 20  Ht 5' (1.524 m)  Wt 145 lb (65.772 kg)  BMI 28.32 kg/m2  SpO2 97% Physical Exam  Nursing note and vitals reviewed. Constitutional: She is oriented to person, place, and time. She appears well-developed and well-nourished.  HENT:  Head: Normocephalic.  Mouth/Throat: Oropharynx is clear and moist. No oropharyngeal exudate.  Eyes: Conjunctivae and EOM are normal. Pupils are equal, round, and reactive to light.  Neck: Normal range of motion. Neck supple.  Cardiovascular: Normal rate, regular rhythm, normal heart sounds and intact distal pulses.  Exam reveals no gallop and no friction rub.   No murmur heard. Pulmonary/Chest: Effort normal and breath sounds normal. No respiratory distress. She has no wheezes.  Abdominal: Soft. Bowel sounds are normal. There is no tenderness. There is no rebound and no guarding.  Musculoskeletal: Normal range of motion. She exhibits no edema and no tenderness.  Neurological: She is alert and oriented to person, place, and time.  Skin: Skin is warm and dry.  Psychiatric: She has a normal mood and affect. Her behavior is normal.    ED Course  Procedures (including critical care time) Labs Review Labs Reviewed - No data to display Imaging Review Dg  Chest 2 View  10/07/2013   *RADIOLOGY REPORT*  Clinical Data: Cough and fever  CHEST - 2 VIEW  Comparison: 11/30/2009  Findings: There is no evidence of fracture or dislocation.  There is no evidence of arthropathy or other focal bone abnormality. Soft tissues are unremarkable.  IMPRESSION: Negative exam   Original Report Authenticated By: Signa Kell, M.D.    MDM   1. Viral URI    9:31 PM 32 y.o. female who presents with cough for one month. The patient notes recent worsening with body aches and cough now productive of yellow sputum. She is afebrile and vital signs  are unremarkable here. She appears well on exam. She also notes occasional wheezing. Likely a viral URI but in the setting of worsening cough and symptoms will cover with azithromycin for community-acquired pneumonia.  9:31 PM:  I have discussed the diagnosis/risks/treatment options with the patient and believe the pt to be eligible for discharge home to follow-up with pcp as needed. We also discussed returning to the ED immediately if new or worsening sx occur. We discussed the sx which are most concerning (e.g., sob, worsening cough) that necessitate immediate return. Any new prescriptions provided to the patient are listed below.  New Prescriptions   AZITHROMYCIN (ZITHROMAX Z-PAK) 250 MG TABLET    2 po day one, then 1 daily x 4 days       Junius Argyle, MD 10/08/13 1308

## 2013-11-10 ENCOUNTER — Encounter (HOSPITAL_COMMUNITY): Payer: Self-pay | Admitting: Emergency Medicine

## 2013-11-10 ENCOUNTER — Emergency Department (HOSPITAL_COMMUNITY)
Admission: EM | Admit: 2013-11-10 | Discharge: 2013-11-10 | Disposition: A | Discharge status: Home / Self Care | Payer: Medicaid Other | Attending: Emergency Medicine | Admitting: Emergency Medicine

## 2013-11-10 DIAGNOSIS — Z8614 Personal history of Methicillin resistant Staphylococcus aureus infection: Secondary | ICD-10-CM | POA: Insufficient documentation

## 2013-11-10 DIAGNOSIS — Y929 Unspecified place or not applicable: Secondary | ICD-10-CM | POA: Insufficient documentation

## 2013-11-10 DIAGNOSIS — I10 Essential (primary) hypertension: Secondary | ICD-10-CM | POA: Insufficient documentation

## 2013-11-10 DIAGNOSIS — W57XXXA Bitten or stung by nonvenomous insect and other nonvenomous arthropods, initial encounter: Secondary | ICD-10-CM

## 2013-11-10 DIAGNOSIS — J45909 Unspecified asthma, uncomplicated: Secondary | ICD-10-CM | POA: Insufficient documentation

## 2013-11-10 DIAGNOSIS — Y9389 Activity, other specified: Secondary | ICD-10-CM | POA: Insufficient documentation

## 2013-11-10 DIAGNOSIS — Z79899 Other long term (current) drug therapy: Secondary | ICD-10-CM | POA: Insufficient documentation

## 2013-11-10 DIAGNOSIS — F17200 Nicotine dependence, unspecified, uncomplicated: Secondary | ICD-10-CM | POA: Insufficient documentation

## 2013-11-10 DIAGNOSIS — R21 Rash and other nonspecific skin eruption: Secondary | ICD-10-CM | POA: Insufficient documentation

## 2013-11-10 MED ORDER — HYDROCORTISONE 2.5 % EX LOTN: TOPICAL_LOTION | Freq: Two times a day (BID) | CUTANEOUS | Status: DC

## 2013-11-10 NOTE — ED Notes (Signed)
Pain, swelling.itching, redness in l/lower arm x 24 hrs

## 2013-11-10 NOTE — ED Provider Notes (Signed)
CSN: [id number]     Arrival date & time 11/10/13  1440 History  This chart was scribed for non-physician practitioner working with Erika Shi, MD by [name], ED scribe. This patient was seen in room WTR7/WTR7 and the patient's care was started at 4:14 PM.   First MD Initiated Contact with Patient 11/10/13 1535     Chief Complaint  Patient presents with  . Rash  . Insect Bite    swelling and redness in l/forearm x 24 hrs   (Consider location/radiation/quality/duration/timing/severity/associated sxs/prior Treatment) The history is provided by the patient and medical records. No language interpreter was used.   HPI Comments: [name] is a 32 y.o. female who presents to the Emergency Department complaining of insect bite to her left forearm that it pruritic.  She reports that she noticed the area yesterday morning.   She states it is progressively worsening for the past 24 hours and this morning it was warm to the touch. She has not noticed any drainage from the area.  No treatment prior to arrival.   Pt denies headache, nausea, vomiting,and fever. Pt has allergies to oxycodone-acetaminophen, percocet, sulfonamide derivatives, and morphine. She currently smokes 1 pack of cigarettes per day and does not use alcohol.    Past Medical History  Diagnosis Date  . Anemia   . ADHD (attention deficit hyperactivity disorder)   . Back pain   . Scoliosis   . Leukemia at age 27  . Headache(784.0)   . Endometriosis   . Hydradenitis   . S/P total abdominal hysterectomy 06/26/2011  . Thyroid disease   . MRSA (methicillin resistant staph aureus) culture positive 2010    ear infection that cultured out MRSA   . Hydradenitis 07/13/2011  . Abdominal wall mass   . Arthritis   . Chest pain   . Migraines   . Diarrhea   . Nausea   . Asthma   . Hypertension     on inderal  . Depression     tried several meds, not on any now   Past Surgical History  Procedure Laterality Date  .  Laparoscopic endometriosis fulguration  06/1999  . Axillary hidradenitis excision      bilateral  . Cesarean section  08/31/2006, 08/26/2007  . Abdominal hysterectomy  06/30/2008    partial  . Mass excision  06/19/2012    Procedure: EXCISION MASS;  Surgeon: Robyne Askew, MD;  Location: [facility];  Service: General;  Laterality: Right;  excision mass right abdominal wall   Family History  Problem Relation Age of Onset  . Heart disease Mother   . Diabetes Mother   . Hypertension Mother   . Heart disease Father   . Diabetes Father   . Hypertension Father   . Bipolar disorder Sister   . Bipolar disorder Brother   . Cancer Brother     prostate  . Pancreatic cancer Maternal Grandmother   . Cancer Maternal Grandmother     pancreatic  . Cancer Maternal Uncle     lung  . Cancer Paternal Aunt    History  Substance Use Topics  . Smoking status: Current Every Day Smoker -- 1.00 packs/day for 11 years    Types: Cigarettes  . Smokeless tobacco: Never Used     Comment: too much stress  . Alcohol Use: No   OB History   Grav Para Term Preterm Abortions TAB SAB Ect Mult Living   2 2  2  2     Review of Systems  All other systems reviewed and are negative.    Allergies  Oxycodone-acetaminophen; Percocet; Sulfonamide derivatives; and Morphine and related  Home Medications   Current Outpatient Rx  Name  Route  Sig  Dispense  Refill  . albuterol (PROVENTIL HFA;VENTOLIN HFA) 108 (90 BASE) MCG/ACT inhaler   Inhalation   Inhale 2 puffs into the lungs every 6 (six) hours as needed. Wheezing.         Marland Kitchen lisinopril (PRINIVIL,ZESTRIL) 10 MG tablet   Oral   Take 10 mg by mouth every morning.         . propranolol (INDERAL) 40 MG tablet   Oral   Take 40 mg by mouth 2 (two) times daily.          Pulse 80  Temp(Src) 97.8 F (36.6 C) (Oral)  Resp 16  SpO2 96% Physical Exam  Nursing note and vitals reviewed. Constitutional: She is oriented to person, place,  and time. She appears well-developed and well-nourished. No distress.  HENT:  Head: Normocephalic and atraumatic.  No swelling of the lips, tongue, or throat.  Neck: Normal range of motion. Neck supple.  Cardiovascular: Normal rate, regular rhythm, normal heart sounds and intact distal pulses.   Pulmonary/Chest: Effort normal and breath sounds normal. She has no wheezes. She has no rales.  Musculoskeletal: Normal range of motion.       Left elbow: She exhibits normal range of motion and no swelling. No tenderness found.       Left wrist: She exhibits normal range of motion, no bony tenderness and no swelling.  Neurological: She is alert and oriented to person, place, and time.  Skin: Skin is warm and dry.  Circular erythematous wheal on the left forearm No drainage.  No fluctuance 2+ distal radial pulses bilaterally Distal sensation in all fingers are intact    Psychiatric: She has a normal mood and affect. Her behavior is normal.    ED Course  Procedures (including critical care time) DIAGNOSTIC STUDIES: Oxygen Saturation is 96% on room air, normal by my interpretation.    COORDINATION OF CARE: 4:20 PM Discussed course of care with pt . Pt understands and agrees.  Labs Review Labs Reviewed - No data to display Imaging Review No results found.  EKG Interpretation   None       MDM  No diagnosis found. Patient presenting with an erythematous wheal of her forearm.  No fluctuance or drainage.  Appearance most consistent with insect bite.  Patient is afebrile.  No SOB.  No swelling of the lips, tongue, or throat.  Feel that the patient is stable for discharge.  Return precautions given.    Santiago Glad, PA-C 11/11/13 1210  Santiago Glad, PA-C 11/11/13 [phone]

## 2013-11-11 NOTE — ED Provider Notes (Signed)
Medical screening examination/treatment/procedure(s) were performed by non-physician practitioner and as supervising physician I was immediately available for consultation/collaboration.   [name], MD 11/11/13 1640

## 2014-01-21 DIAGNOSIS — J4530 Mild persistent asthma, uncomplicated: Secondary | ICD-10-CM | POA: Insufficient documentation

## 2014-02-17 ENCOUNTER — Ambulatory Visit: Payer: Medicaid Other | Attending: Anesthesiology

## 2014-02-17 DIAGNOSIS — M545 Low back pain, unspecified: Secondary | ICD-10-CM | POA: Diagnosis not present

## 2014-02-17 DIAGNOSIS — M25569 Pain in unspecified knee: Secondary | ICD-10-CM | POA: Insufficient documentation

## 2014-02-17 DIAGNOSIS — M2560 Stiffness of unspecified joint, not elsewhere classified: Secondary | ICD-10-CM | POA: Diagnosis not present

## 2014-02-17 DIAGNOSIS — IMO0001 Reserved for inherently not codable concepts without codable children: Secondary | ICD-10-CM | POA: Diagnosis present

## 2014-05-14 ENCOUNTER — Encounter (HOSPITAL_COMMUNITY): Payer: Self-pay | Admitting: Emergency Medicine

## 2014-05-14 ENCOUNTER — Emergency Department (HOSPITAL_COMMUNITY)
Admission: EM | Admit: 2014-05-14 | Discharge: 2014-05-14 | Disposition: A | Discharge status: Home / Self Care | Payer: Medicaid Other | Attending: Emergency Medicine | Admitting: Emergency Medicine

## 2014-05-14 DIAGNOSIS — Z8659 Personal history of other mental and behavioral disorders: Secondary | ICD-10-CM | POA: Insufficient documentation

## 2014-05-14 DIAGNOSIS — J45909 Unspecified asthma, uncomplicated: Secondary | ICD-10-CM | POA: Insufficient documentation

## 2014-05-14 DIAGNOSIS — Z8639 Personal history of other endocrine, nutritional and metabolic disease: Secondary | ICD-10-CM | POA: Insufficient documentation

## 2014-05-14 DIAGNOSIS — Z79899 Other long term (current) drug therapy: Secondary | ICD-10-CM | POA: Insufficient documentation

## 2014-05-14 DIAGNOSIS — IMO0002 Reserved for concepts with insufficient information to code with codable children: Secondary | ICD-10-CM | POA: Insufficient documentation

## 2014-05-14 DIAGNOSIS — I10 Essential (primary) hypertension: Secondary | ICD-10-CM | POA: Insufficient documentation

## 2014-05-14 DIAGNOSIS — R42 Dizziness and giddiness: Secondary | ICD-10-CM | POA: Insufficient documentation

## 2014-05-14 DIAGNOSIS — F17200 Nicotine dependence, unspecified, uncomplicated: Secondary | ICD-10-CM | POA: Insufficient documentation

## 2014-05-14 DIAGNOSIS — Z8739 Personal history of other diseases of the musculoskeletal system and connective tissue: Secondary | ICD-10-CM | POA: Insufficient documentation

## 2014-05-14 DIAGNOSIS — Z856 Personal history of leukemia: Secondary | ICD-10-CM | POA: Insufficient documentation

## 2014-05-14 DIAGNOSIS — R197 Diarrhea, unspecified: Secondary | ICD-10-CM

## 2014-05-14 DIAGNOSIS — Z8614 Personal history of Methicillin resistant Staphylococcus aureus infection: Secondary | ICD-10-CM | POA: Insufficient documentation

## 2014-05-14 DIAGNOSIS — Z862 Personal history of diseases of the blood and blood-forming organs and certain disorders involving the immune mechanism: Secondary | ICD-10-CM | POA: Insufficient documentation

## 2014-05-14 DIAGNOSIS — Z872 Personal history of diseases of the skin and subcutaneous tissue: Secondary | ICD-10-CM | POA: Insufficient documentation

## 2014-05-14 DIAGNOSIS — Z8742 Personal history of other diseases of the female genital tract: Secondary | ICD-10-CM | POA: Insufficient documentation

## 2014-05-14 DIAGNOSIS — K625 Hemorrhage of anus and rectum: Secondary | ICD-10-CM

## 2014-05-14 LAB — I-STAT CHEM 8, ED
BUN: 5 mg/dL — ABNORMAL LOW (ref 6–23)
CALCIUM ION: 1.16 mmol/L (ref 1.12–1.23)
Chloride: 105 mEq/L (ref 96–112)
Creatinine, Ser: 0.9 mg/dL (ref 0.50–1.10)
Glucose, Bld: 88 mg/dL (ref 70–99)
HEMATOCRIT: 45 % (ref 36.0–46.0)
Hemoglobin: 15.3 g/dL — ABNORMAL HIGH (ref 12.0–15.0)
Potassium: 3.8 mEq/L (ref 3.7–5.3)
Sodium: 139 mEq/L (ref 137–147)
TCO2: 25 mmol/L (ref 0–100)

## 2014-05-14 LAB — POC OCCULT BLOOD, ED: FECAL OCCULT BLD: POSITIVE — AB

## 2014-05-14 MED ORDER — SODIUM CHLORIDE 0.9 % IV BOLUS (SEPSIS)
1000.0000 mL | Freq: Once | INTRAVENOUS | Status: AC
  Administered 2014-05-14: 1000 mL via INTRAVENOUS

## 2014-05-14 MED ORDER — ONDANSETRON HCL 4 MG/2ML IJ SOLN
4.0000 mg | Freq: Once | INTRAMUSCULAR | Status: AC
  Administered 2014-05-14: 4 mg via INTRAVENOUS
  Filled 2014-05-14: qty 2

## 2014-05-14 NOTE — Discharge Instructions (Signed)
Please follow up with a GI specialist for further evaluation of your rectal bleeding.  Once you produce a bowel movement, please collect it and bring it to our lab to have it evaluate for C.diff infection.  Return if you have any concerns.   Bloody Stools Bloody stools often mean that there is a problem in the digestive tract. Your caregiver may use the term "melena" to describe black, tarry, and bad smelling stools or "hematochezia" to describe red or maroon-colored stools. Blood seen in the stool can be caused by bleeding anywhere along the intestinal tract.  A black stool usually means that blood is coming from the upper part of the gastrointestinal tract (esophagus, stomach, or small bowel). Passing maroon-colored stools or bright red blood usually means that blood is coming from lower down in the large bowel or the rectum. However, sometimes massive bleeding in the stomach or small intestine can cause bright red bloody stools.  Consuming black licorice, lead, iron pills, medicines containing bismuth subsalicylate, or blueberries can also cause black stools. Your caregiver can test black stools to see if blood is present. It is important that the cause of the bleeding be found. Treatment can then be started, and the problem can be corrected. Rectal bleeding may not be serious, but you should not assume everything is okay until you know the cause.It is very important to follow up with your caregiver or a specialist in gastrointestinal problems. CAUSES  Blood in the stools can come from various underlying causes.Often, the cause is not found during your first visit. Testing is often needed to discover the cause of bleeding in the gastrointestinal tract. Causes range from simple to serious or even life-threatening.Possible causes include:  Hemorrhoids.These are veins that are full of blood (engorged) in the rectum. They cause pain, inflammation, and may bleed.  Anal fissures.These are areas of  painful tearing which may bleed. They are often caused by passing hard stool.  Diverticulosis.These are pouches that form on the colon over time, with age, and may bleed significantly.  Diverticulitis.This is inflammation in areas with diverticulosis. It can cause pain, fever, and bloody stools, although bleeding is rare.  Proctitis and colitis. These are inflamed areas of the rectum or colon. They may cause pain, fever, and bloody stools.  Polyps and cancer. Colon cancer is a leading cause of preventable cancer death.It often starts out as precancerous polyps that can be removed during a colonoscopy, preventing progression into cancer. Sometimes, polyps and cancer may cause rectal bleeding.  Gastritis and ulcers.Bleeding from the upper gastrointestinal tract (near the stomach) may travel through the intestines and produce black, sometimes tarry, often bad smelling stools. In certain cases, if the bleeding is fast enough, the stools may not be black, but red and the condition may be life-threatening. SYMPTOMS  You may have stools that are bright red and bloody, that are normal color with blood on them, or that are dark black and tarry. In some cases, you may only have blood in the toilet bowl. Any of these cases need medical care. You may also have:  Pain at the anus or anywhere in the rectum.  Lightheadedness or feeling faint.  Extreme weakness.  Nausea or vomiting.  Fever. DIAGNOSIS Your caregiver may use the following methods to find the cause of your bleeding:  Taking a medical history. Age is important. Older people tend to develop polyps and cancer more often. If there is anal pain and a hard, large stool associated with bleeding, a  tear of the anus may be the cause. If blood drips into the toilet after a bowel movement, bleeding hemorrhoids may be the problem. The color and frequency of the bleeding are additional considerations. In most cases, the medical history provides clues,  but seldom the final answer.  A visual and finger (digital) exam. Your caregiver will inspect the anal area, looking for tears and hemorrhoids. A finger exam can provide information when there is tenderness or a growth inside. In men, the prostate is also examined.  Endoscopy. Several types of small, long scopes (endoscopes) are used to view the colon.  In the office, your caregiver may use a rigid, or more commonly, a flexible viewing sigmoidoscope. This exam is called flexible sigmoidoscopy. It is performed in 5 to 10 minutes.  A more thorough exam is accomplished with a colonoscope. It allows your caregiver to view the entire 5 to 6 foot long colon. Medicine to help you relax (sedative) is usually given for this exam. Frequently, a bleeding lesion may be present beyond the reach of the sigmoidoscope. So, a colonoscopy may be the best exam to start with. Both exams are usually done on an outpatient basis. This means the patient does not stay overnight in the hospital or surgery center.  An upper endoscopy may be needed to examine your stomach. Sedation is used and a flexible endoscope is put in your mouth, down to your stomach.  A barium enema X-ray. This is an X-ray exam. It uses liquid barium inserted by enema into the rectum. This test alone may not identify an actual bleeding point. X-rays highlight abnormal shadows, such as those made by lumps (tumors), diverticuli, or colitis. TREATMENT  Treatment depends on the cause of your bleeding.   For bleeding from the stomach or colon, the caregiver doing your endoscopy or colonoscopy may be able to stop the bleeding as part of the procedure.  Inflammation or infection of the colon can be treated with medicines.  Many rectal problems can be treated with creams, suppositories, or warm baths.  Surgery is sometimes needed.  Blood transfusions are sometimes needed if you have lost a lot of blood.  For any bleeding problem, let your caregiver  know if you take aspirin or other blood thinners regularly. HOME CARE INSTRUCTIONS   Take any medicines exactly as prescribed.  Keep your stools soft by eating a diet high in fiber. Prunes (1 to 3 a day) work well for many people.  Drink enough water and fluids to keep your urine clear or pale yellow.  Take sitz baths if advised. A sitz bath is when you sit in a bathtub with warm water for 10 to 15 minutes to soak, soothe, and cleanse the rectal area.  If enemas or suppositories are advised, be sure you know how to use them. Tell your caregiver if you have problems with this.  Monitor your bowel movements to look for signs of improvement or worsening. SEEK MEDICAL CARE IF:   You do not improve in the time expected.  Your condition worsens after initial improvement.  You develop any new symptoms. SEEK IMMEDIATE MEDICAL CARE IF:   You develop severe or prolonged rectal bleeding.  You vomit blood.  You feel weak or faint.  You have a fever. MAKE SURE YOU:  Understand these instructions.  Will watch your condition.  Will get help right away if you are not doing well or get worse. Document Released: 12/07/2002 Document Revised: 03/10/2012 Document Reviewed: 05/04/2011 [employer] Patient  Information ©2014 [employer]. ° °

## 2014-05-14 NOTE — ED Provider Notes (Signed)
CSN: [id number]     Arrival date & time 05/14/14  1934 History   First MD Initiated Contact with Patient 05/14/14 2019     Chief Complaint  Patient presents with  . Rectal Bleeding  . Diarrhea     (Consider location/radiation/quality/duration/timing/severity/associated sxs/prior Treatment) HPI  33 year old female with history of anemia, MRSA, leukemia who presents complaining of rectal bleeding. Patient states she has had multiple bouts of loose stools mixed with bright red blood for the past 2 days. Yesterday she thought she was passing gas when she accidentally had a bowel movement on herself. States she has loose stools next with bright red blood. She has a total of 3 separate bowel movements yesterday and 4 bowel movements today with a total of half a cup of bright red blood.  Patient normally has one bowel movement a day. Reports feeling lightheadedness for the past several days. She denies having any fever, chills, chest pain, shortness of breath, abdominal pain, back pain, dysuria, rectal pain, rectal itchiness, black stool, no hematemesis.  She is not on any blood thinning medication. She reported been diagnosed with MRSA on that year she was taking clindamycin which she finished a week ago. No prior history of C. difficile. No recent travel or any exotic food.   Past Medical History  Diagnosis Date  . Anemia   . ADHD (attention deficit hyperactivity disorder)   . Back pain   . Scoliosis   . Leukemia at age 69  . Headache(784.0)   . Endometriosis   . Hydradenitis   . S/P total abdominal hysterectomy 06/26/2011  . Thyroid disease   . MRSA (methicillin resistant staph aureus) culture positive 2010    ear infection that cultured out MRSA   . Hydradenitis 07/13/2011  . Abdominal wall mass   . Arthritis   . Chest pain   . Migraines   . Diarrhea   . Nausea   . Asthma   . Hypertension     on inderal  . Depression     tried several meds, not on any now   Past Surgical History   Procedure Laterality Date  . Laparoscopic endometriosis fulguration  06/1999  . Axillary hidradenitis excision      bilateral  . Cesarean section  08/31/2006, 08/26/2007  . Abdominal hysterectomy  06/30/2008    partial  . Mass excision  06/19/2012    Procedure: EXCISION MASS;  Surgeon: Merrie Roof, MD;  Location: [facility];  Service: General;  Laterality: Right;  excision mass right abdominal wall   Family History  Problem Relation Age of Onset  . Heart disease Mother   . Diabetes Mother   . Hypertension Mother   . Heart disease Father   . Diabetes Father   . Hypertension Father   . Bipolar disorder Sister   . Bipolar disorder Brother   . Cancer Brother     prostate  . Pancreatic cancer Maternal Grandmother   . Cancer Maternal Grandmother     pancreatic  . Cancer Maternal Uncle     lung  . Cancer Paternal Aunt    History  Substance Use Topics  . Smoking status: Current Every Day Smoker -- 1.00 packs/day for 11 years    Types: Cigarettes  . Smokeless tobacco: Never Used     Comment: too much stress  . Alcohol Use: No   OB History   Grav Para Term Preterm Abortions TAB SAB Ect Mult Living   2 2  2      2     Review of Systems  All other systems reviewed and are negative.     Allergies  Oxycodone-acetaminophen; Percocet; Sulfonamide derivatives; and Morphine and related  Home Medications   Prior to Admission medications   Medication Sig Start Date End Date Taking? Authorizing Provider  albuterol (PROVENTIL HFA;VENTOLIN HFA) 108 (90 BASE) MCG/ACT inhaler Inhale 2 puffs into the lungs every 6 (six) hours as needed. Wheezing.    Historical Provider, MD  hydrocortisone 2.5 % lotion Apply topically 2 (two) times daily. 11/10/13   Heather Laisure, PA-C  lisinopril (PRINIVIL,ZESTRIL) 10 MG tablet Take 10 mg by mouth every morning.    Historical Provider, MD  propranolol (INDERAL) 40 MG tablet Take 40 mg by mouth 2 (two) times daily.    Historical  Provider, MD   BP 135/93  Pulse 79  Temp(Src) 98.2 F (36.8 C) (Oral)  Resp 20  Ht 5\' 1"  (1.549 m)  Wt 149 lb (67.586 kg)  BMI 28.17 kg/m2  SpO2 100% Physical Exam  Nursing note and vitals reviewed. Constitutional: She is oriented to person, place, and time. She appears well-developed and well-nourished. No distress.  HENT:  Head: Atraumatic.  Mouth/Throat: Oropharynx is clear and moist.  Eyes: Conjunctivae are normal.  Neck: Neck supple.  Cardiovascular: Normal rate and regular rhythm.   Pulmonary/Chest: Effort normal.  Abdominal: Soft. There is no tenderness.  Genitourinary:  Chaperone present:  Normal rectal tone, no active bleeding, no bright red blood, no external hemorrhoid, no anal fissure, no mass.  Neurological: She is alert and oriented to person, place, and time.  Skin: No rash noted.  Psychiatric: She has a normal mood and affect.    ED Course  Procedures (including critical care time)  8:47 PM Pt here with recurrent diarrhea mixed with bright red blood.  Recent clindamycin use 1 week ago concerning for c.dif.  Pt otherwise hemodynamically stable.  Work up initiated.    10:01 PM Pt has not produced any BM in ER.  Is afebrile, VSS.  She has a benign abdomen and positive fecal occult blood.  No evidence of anemia.  Plan to have pt f/u with GI for further evaluation.  Pt will also send in a stool sample for c.diff stool culture since pt unable to give a sample today.  Return precaution discussed.    Labs Review Labs Reviewed  POC OCCULT BLOOD, ED - Abnormal; Notable for the following:    Fecal Occult Bld POSITIVE (*)    All other components within normal limits  I-STAT CHEM 8, ED - Abnormal; Notable for the following:    BUN 5 (*)    Hemoglobin 15.3 (*)    All other components within normal limits  CLOSTRIDIUM DIFFICILE BY PCR    Imaging Review No results found.   EKG Interpretation None      MDM   Final diagnoses:  Bright red rectal bleeding   Diarrhea    BP 135/93  Pulse 79  Temp(Src) 98.2 F (36.8 C) (Oral)  Resp 20  Ht 5\' 1"  (1.549 m)  Wt 149 lb (67.586 kg)  BMI 28.17 kg/m2  SpO2 100%  I have reviewed nursing notes and vital signs.  I reviewed available ER/hospitalization records thought the EMR     Domenic Moras, [place] 05/14/14 2203

## 2014-05-14 NOTE — ED Notes (Signed)
Pt arrived to the ED with a complaint of rectal bleeding.  Pt states she has been runny stools with blood in them for the last two days.  Pt states that she has had 7 times total in the last two days.  Pt describes blood as bright red.  Pt has no complaint of pain.

## 2014-05-14 NOTE — ED Provider Notes (Signed)
Medical screening examination/treatment/procedure(s) were performed by non-physician practitioner and as supervising physician I was immediately available for consultation/collaboration.   EKG Interpretation None       Shigeko Manard R. Elissa Grieshop, MD 05/14/14 2334 

## 2014-05-15 ENCOUNTER — Encounter (HOSPITAL_COMMUNITY): Payer: Medicaid Other

## 2014-05-15 ENCOUNTER — Ambulatory Visit (HOSPITAL_COMMUNITY)
Admission: RE | Admit: 2014-05-15 | Discharge: 2014-05-15 | Disposition: A | Discharge status: Home / Self Care | Payer: Medicaid Other | Source: Ambulatory Visit | Attending: Family Medicine | Admitting: Family Medicine

## 2014-05-15 LAB — CLOSTRIDIUM DIFFICILE BY PCR: CDIFFPCR: POSITIVE — AB

## 2014-05-15 NOTE — ED Notes (Signed)
Pt came back today with stool sample. Lab being called to ask how to process.

## 2014-05-15 NOTE — ED Provider Notes (Signed)
Pt was evaluated for diarrhea in the ER on 05/14/13.  C.diff PCR ordered d/t recent clindamycin use and it is positive.  Pt returned to ER for test results this evening.  She reports that she is feeling worse than she was when she was seen previously.  She is non-toxic and comfortable appearing.  I offered for her to check back into the ER or to take her prescription for flagyl.  She opted to go home.  Prescribed 500mg  flagyl TID x 14d.  Return precautions discussed. 8:28 PM (05/15/14)  Remer Macho, PA-C 05/15/14 2028

## 2014-05-15 NOTE — ED Provider Notes (Deleted)
Pt was evaluated for diarrhea in ER on 05/15/14.  C.diff PCR ordered d/t recent Clindamycin use and patient returns today for results.  She does have c.diff.  I have prescribed 500mg  flagyl TID x 14d.  She reports that she is feeling worse than she was when she was seen previously.  I offered for her to check back into the ER, but she prefers to go home w/ script.  She is non-toxic and comfortable appearing.  Return precautions discussed.  8:24 PM   Carlisle Cater, PA-C 05/15/14 2024

## 2014-05-16 NOTE — ED Provider Notes (Signed)
Medical screening examination/treatment/procedure(s) were performed by non-physician practitioner and as supervising physician I was immediately available for consultation/collaboration.   EKG Interpretation None       Jasper Riling. Alvino Chapel, MD 05/16/14 7654

## 2014-08-02 ENCOUNTER — Encounter (HOSPITAL_COMMUNITY): Payer: Self-pay | Admitting: Emergency Medicine

## 2014-08-02 DIAGNOSIS — G43909 Migraine, unspecified, not intractable, without status migrainosus: Secondary | ICD-10-CM | POA: Insufficient documentation

## 2014-08-02 DIAGNOSIS — Z8742 Personal history of other diseases of the female genital tract: Secondary | ICD-10-CM | POA: Insufficient documentation

## 2014-08-02 DIAGNOSIS — Z8639 Personal history of other endocrine, nutritional and metabolic disease: Secondary | ICD-10-CM | POA: Insufficient documentation

## 2014-08-02 DIAGNOSIS — F3289 Other specified depressive episodes: Secondary | ICD-10-CM | POA: Diagnosis not present

## 2014-08-02 DIAGNOSIS — I10 Essential (primary) hypertension: Secondary | ICD-10-CM | POA: Diagnosis not present

## 2014-08-02 DIAGNOSIS — F17200 Nicotine dependence, unspecified, uncomplicated: Secondary | ICD-10-CM | POA: Insufficient documentation

## 2014-08-02 DIAGNOSIS — Z862 Personal history of diseases of the blood and blood-forming organs and certain disorders involving the immune mechanism: Secondary | ICD-10-CM | POA: Diagnosis not present

## 2014-08-02 DIAGNOSIS — R112 Nausea with vomiting, unspecified: Secondary | ICD-10-CM | POA: Insufficient documentation

## 2014-08-02 DIAGNOSIS — F909 Attention-deficit hyperactivity disorder, unspecified type: Secondary | ICD-10-CM | POA: Diagnosis not present

## 2014-08-02 DIAGNOSIS — IMO0002 Reserved for concepts with insufficient information to code with codable children: Secondary | ICD-10-CM | POA: Insufficient documentation

## 2014-08-02 DIAGNOSIS — F329 Major depressive disorder, single episode, unspecified: Secondary | ICD-10-CM | POA: Insufficient documentation

## 2014-08-02 DIAGNOSIS — J45909 Unspecified asthma, uncomplicated: Secondary | ICD-10-CM | POA: Insufficient documentation

## 2014-08-02 DIAGNOSIS — Z7982 Long term (current) use of aspirin: Secondary | ICD-10-CM | POA: Insufficient documentation

## 2014-08-02 DIAGNOSIS — Z79899 Other long term (current) drug therapy: Secondary | ICD-10-CM | POA: Diagnosis not present

## 2014-08-02 DIAGNOSIS — M129 Arthropathy, unspecified: Secondary | ICD-10-CM | POA: Insufficient documentation

## 2014-08-02 DIAGNOSIS — Z8614 Personal history of Methicillin resistant Staphylococcus aureus infection: Secondary | ICD-10-CM | POA: Insufficient documentation

## 2014-08-02 NOTE — ED Notes (Signed)
Pt states vomiting since last night.  Green emesis.  No abdominal pain.  Headache and fever.  No difficulty urinating.

## 2014-08-03 ENCOUNTER — Emergency Department (HOSPITAL_COMMUNITY)
Admission: EM | Admit: 2014-08-03 | Discharge: 2014-08-03 | Disposition: A | Discharge status: Home / Self Care | Payer: Medicaid Other | Attending: Emergency Medicine | Admitting: Emergency Medicine

## 2014-08-03 DIAGNOSIS — R112 Nausea with vomiting, unspecified: Secondary | ICD-10-CM

## 2014-08-03 LAB — URINALYSIS, ROUTINE W REFLEX MICROSCOPIC
BILIRUBIN URINE: NEGATIVE
Glucose, UA: NEGATIVE mg/dL
Hgb urine dipstick: NEGATIVE
KETONES UR: NEGATIVE mg/dL
Leukocytes, UA: NEGATIVE
NITRITE: NEGATIVE
PROTEIN: NEGATIVE mg/dL
SPECIFIC GRAVITY, URINE: 1.014 (ref 1.005–1.030)
UROBILINOGEN UA: 1 mg/dL (ref 0.0–1.0)
pH: 6 (ref 5.0–8.0)

## 2014-08-03 LAB — CBC WITH DIFFERENTIAL/PLATELET
BASOS ABS: 0.1 10*3/uL (ref 0.0–0.1)
BASOS PCT: 1 % (ref 0–1)
Eosinophils Absolute: 0.1 10*3/uL (ref 0.0–0.7)
Eosinophils Relative: 2 % (ref 0–5)
HCT: 45.5 % (ref 36.0–46.0)
Hemoglobin: 15.3 g/dL — ABNORMAL HIGH (ref 12.0–15.0)
Lymphocytes Relative: 34 % (ref 12–46)
Lymphs Abs: 2.4 10*3/uL (ref 0.7–4.0)
MCH: 28.9 pg (ref 26.0–34.0)
MCHC: 33.6 g/dL (ref 30.0–36.0)
MCV: 85.8 fL (ref 78.0–100.0)
MONO ABS: 0.7 10*3/uL (ref 0.1–1.0)
Monocytes Relative: 9 % (ref 3–12)
NEUTROS ABS: 4 10*3/uL (ref 1.7–7.7)
NEUTROS PCT: 54 % (ref 43–77)
Platelets: 227 10*3/uL (ref 150–400)
RBC: 5.3 MIL/uL — ABNORMAL HIGH (ref 3.87–5.11)
RDW: 12.7 % (ref 11.5–15.5)
WBC: 7.3 10*3/uL (ref 4.0–10.5)

## 2014-08-03 LAB — BASIC METABOLIC PANEL
ANION GAP: 13 (ref 5–15)
BUN: 11 mg/dL (ref 6–23)
CHLORIDE: 100 meq/L (ref 96–112)
CO2: 24 mEq/L (ref 19–32)
CREATININE: 0.63 mg/dL (ref 0.50–1.10)
Calcium: 8.9 mg/dL (ref 8.4–10.5)
Glucose, Bld: 100 mg/dL — ABNORMAL HIGH (ref 70–99)
POTASSIUM: 3.6 meq/L — AB (ref 3.7–5.3)
Sodium: 137 mEq/L (ref 137–147)

## 2014-08-03 MED ORDER — SODIUM CHLORIDE 0.9 % IV BOLUS (SEPSIS)
1000.0000 mL | Freq: Once | INTRAVENOUS | Status: AC
  Administered 2014-08-03: 1000 mL via INTRAVENOUS

## 2014-08-03 MED ORDER — ONDANSETRON HCL 4 MG/2ML IJ SOLN
4.0000 mg | Freq: Once | INTRAMUSCULAR | Status: AC
  Administered 2014-08-03: 4 mg via INTRAVENOUS
  Filled 2014-08-03: qty 2

## 2014-08-03 MED ORDER — ONDANSETRON 8 MG PO TBDP: 8.0000 mg | ORAL_TABLET | Freq: Three times a day (TID) | ORAL | Status: DC | PRN

## 2014-08-03 NOTE — Discharge Instructions (Signed)

## 2014-08-03 NOTE — ED Provider Notes (Signed)
CSN: [id number]     Arrival date & time 08/02/14  2255 History   First MD Initiated Contact with Patient 08/03/14 0206     Chief Complaint  Patient presents with  . Emesis     HPI Patient reports nausea and vomiting without diarrhea over the past 12-24 hours.  No fevers or chills.  Denies abdominal pain.  States decreased oral intake.  Symptoms are mild in severity.  Nothing worsens or improves her symptoms.  No urinary complaints.  No chest pain or shortness of breath.  No back pain or flank pain.  No rash.   Past Medical History  Diagnosis Date  . Anemia   . ADHD (attention deficit hyperactivity disorder)   . Back pain   . Scoliosis   . Leukemia at age 61  . Headache(784.0)   . Endometriosis   . Hydradenitis   . S/P total abdominal hysterectomy 06/26/2011  . Thyroid disease   . MRSA (methicillin resistant staph aureus) culture positive 2010    ear infection that cultured out MRSA   . Hydradenitis 07/13/2011  . Abdominal wall mass   . Arthritis   . Chest pain   . Migraines   . Diarrhea   . Nausea   . Asthma   . Hypertension     on inderal  . Depression     tried several meds, not on any now   Past Surgical History  Procedure Laterality Date  . Laparoscopic endometriosis fulguration  06/1999  . Axillary hidradenitis excision      bilateral  . Cesarean section  08/31/2006, 08/26/2007  . Abdominal hysterectomy  06/30/2008    partial  . Mass excision  06/19/2012    Procedure: EXCISION MASS;  Surgeon: Merrie Roof, MD;  Location: [facility];  Service: General;  Laterality: Right;  excision mass right abdominal wall   Family History  Problem Relation Age of Onset  . Heart disease Mother   . Diabetes Mother   . Hypertension Mother   . Heart disease Father   . Diabetes Father   . Hypertension Father   . Bipolar disorder Sister   . Bipolar disorder Brother   . Cancer Brother     prostate  . Pancreatic cancer Maternal Grandmother   . Cancer Maternal  Grandmother     pancreatic  . Cancer Maternal Uncle     lung  . Cancer Paternal Aunt    History  Substance Use Topics  . Smoking status: Current Every Day Smoker -- 1.00 packs/day for 11 years    Types: Cigarettes  . Smokeless tobacco: Never Used     Comment: too much stress  . Alcohol Use: No   OB History   Grav Para Term Preterm Abortions TAB SAB Ect Mult Living   2 2  2      2      Review of Systems  All other systems reviewed and are negative.     Allergies  Sulfonamide derivatives; Aleve; Dilaudid; Morphine and related; and Tramadol  Home Medications   Prior to Admission medications   Medication Sig Start Date End Date Taking? Authorizing Provider  albuterol (PROVENTIL HFA;VENTOLIN HFA) 108 (90 BASE) MCG/ACT inhaler Inhale 2 puffs into the lungs every 6 (six) hours as needed. Wheezing.   Yes Historical Provider, MD  ALPRAZolam (XANAX) 0.25 MG tablet Take 0.125 mg by mouth at bedtime as needed for anxiety.   Yes Historical Provider, MD  aspirin-acetaminophen-caffeine (EXCEDRIN MIGRAINE) [phone] MG per  tablet Take 2 tablets by mouth every 8 (eight) hours as needed for migraine.    Yes Historical Provider, MD  beclomethasone (QVAR) 40 MCG/ACT inhaler Inhale 2 puffs into the lungs 2 (two) times daily.  01/21/14  Yes Historical Provider, MD  diclofenac sodium (VOLTAREN) 1 % GEL Place 2 g onto the skin as needed (for knee pain).  01/21/14  Yes Historical Provider, MD  lisdexamfetamine (VYVANSE) 20 MG capsule Take 40 mg by mouth daily.    Yes Historical Provider, MD  lisinopril (PRINIVIL,ZESTRIL) 5 MG tablet Take 5 mg by mouth daily.   Yes Historical Provider, MD  propranolol (INDERAL) 40 MG tablet Take 40 mg by mouth 2 (two) times daily.   Yes Historical Provider, MD  tobramycin-dexamethasone (TOBRADEX) ophthalmic ointment Place 1 application into both eyes 3 (three) times daily.   Yes Historical Provider, MD  ondansetron (ZOFRAN ODT) 8 MG disintegrating tablet Take 1 tablet  (8 mg total) by mouth every 8 (eight) hours as needed for nausea or vomiting. 08/03/14   Hoy Morn, MD   BP 129/99  Pulse 92  Temp(Src) 98.2 F (36.8 C) (Oral)  Resp 18  Ht 5\' 1"  (1.549 m)  Wt 143 lb (64.864 kg)  BMI 27.03 kg/m2  SpO2 100% Physical Exam  Nursing note and vitals reviewed. Constitutional: She is oriented to person, place, and time. She appears well-developed and well-nourished. No distress.  HENT:  Head: Normocephalic and atraumatic.  Eyes: EOM are normal.  Neck: Normal range of motion.  Cardiovascular: Normal rate, regular rhythm and normal heart sounds.   Pulmonary/Chest: Effort normal and breath sounds normal.  Abdominal: Soft. She exhibits no distension. There is no tenderness.  Musculoskeletal: Normal range of motion.  Neurological: She is alert and oriented to person, place, and time.  Skin: Skin is warm and dry.  Psychiatric: She has a normal mood and affect. Judgment normal.    ED Course  Procedures (including critical care time) Labs Review Labs Reviewed  CBC WITH DIFFERENTIAL - Abnormal; Notable for the following:    RBC 5.30 (*)    Hemoglobin 15.3 (*)    All other components within normal limits  BASIC METABOLIC PANEL - Abnormal; Notable for the following:    Potassium 3.6 (*)    Glucose, Bld 100 (*)    All other components within normal limits  URINALYSIS, ROUTINE W REFLEX MICROSCOPIC - Abnormal; Notable for the following:    APPearance CLOUDY (*)    All other components within normal limits    Imaging Review No results found.   EKG Interpretation None      MDM   Final diagnoses:  Non-intractable vomiting with nausea, vomiting of unspecified type    4:18 AM Improvement in symptoms.  Discharge home in good condition.    Hoy Morn, MD 08/03/14 [phone]

## 2014-08-03 NOTE — ED Notes (Signed)
Patient is alert and oriented x3.  She was given DC instructions and follow up visit instructions.  Patient gave verbal understanding. She was DC ambulatory under her own power to home.  V/S stable.  He was not showing any signs of distress on DC 

## 2014-09-09 ENCOUNTER — Encounter (HOSPITAL_COMMUNITY): Payer: Self-pay | Admitting: Emergency Medicine

## 2014-09-09 ENCOUNTER — Emergency Department (HOSPITAL_COMMUNITY)
Admission: EM | Admit: 2014-09-09 | Discharge: 2014-09-09 | Disposition: A | Discharge status: Home / Self Care | Payer: Medicaid Other | Attending: Emergency Medicine | Admitting: Emergency Medicine

## 2014-09-09 DIAGNOSIS — Z8614 Personal history of Methicillin resistant Staphylococcus aureus infection: Secondary | ICD-10-CM | POA: Insufficient documentation

## 2014-09-09 DIAGNOSIS — Z8742 Personal history of other diseases of the female genital tract: Secondary | ICD-10-CM | POA: Insufficient documentation

## 2014-09-09 DIAGNOSIS — M129 Arthropathy, unspecified: Secondary | ICD-10-CM | POA: Diagnosis not present

## 2014-09-09 DIAGNOSIS — F3289 Other specified depressive episodes: Secondary | ICD-10-CM | POA: Diagnosis not present

## 2014-09-09 DIAGNOSIS — G8911 Acute pain due to trauma: Secondary | ICD-10-CM | POA: Insufficient documentation

## 2014-09-09 DIAGNOSIS — Z872 Personal history of diseases of the skin and subcutaneous tissue: Secondary | ICD-10-CM | POA: Diagnosis not present

## 2014-09-09 DIAGNOSIS — Z79899 Other long term (current) drug therapy: Secondary | ICD-10-CM | POA: Insufficient documentation

## 2014-09-09 DIAGNOSIS — IMO0002 Reserved for concepts with insufficient information to code with codable children: Secondary | ICD-10-CM | POA: Diagnosis not present

## 2014-09-09 DIAGNOSIS — M76899 Other specified enthesopathies of unspecified lower limb, excluding foot: Secondary | ICD-10-CM | POA: Diagnosis not present

## 2014-09-09 DIAGNOSIS — F329 Major depressive disorder, single episode, unspecified: Secondary | ICD-10-CM | POA: Insufficient documentation

## 2014-09-09 DIAGNOSIS — I10 Essential (primary) hypertension: Secondary | ICD-10-CM | POA: Diagnosis not present

## 2014-09-09 DIAGNOSIS — Z792 Long term (current) use of antibiotics: Secondary | ICD-10-CM | POA: Diagnosis not present

## 2014-09-09 DIAGNOSIS — F17200 Nicotine dependence, unspecified, uncomplicated: Secondary | ICD-10-CM | POA: Insufficient documentation

## 2014-09-09 DIAGNOSIS — G43909 Migraine, unspecified, not intractable, without status migrainosus: Secondary | ICD-10-CM | POA: Diagnosis not present

## 2014-09-09 DIAGNOSIS — M25569 Pain in unspecified knee: Secondary | ICD-10-CM | POA: Diagnosis not present

## 2014-09-09 DIAGNOSIS — G8929 Other chronic pain: Secondary | ICD-10-CM | POA: Diagnosis not present

## 2014-09-09 DIAGNOSIS — Z8639 Personal history of other endocrine, nutritional and metabolic disease: Secondary | ICD-10-CM | POA: Diagnosis not present

## 2014-09-09 DIAGNOSIS — Z90710 Acquired absence of both cervix and uterus: Secondary | ICD-10-CM | POA: Insufficient documentation

## 2014-09-09 DIAGNOSIS — Z862 Personal history of diseases of the blood and blood-forming organs and certain disorders involving the immune mechanism: Secondary | ICD-10-CM | POA: Insufficient documentation

## 2014-09-09 DIAGNOSIS — J45909 Unspecified asthma, uncomplicated: Secondary | ICD-10-CM | POA: Diagnosis not present

## 2014-09-09 DIAGNOSIS — F909 Attention-deficit hyperactivity disorder, unspecified type: Secondary | ICD-10-CM | POA: Insufficient documentation

## 2014-09-09 DIAGNOSIS — M7061 Trochanteric bursitis, right hip: Secondary | ICD-10-CM

## 2014-09-09 MED ORDER — HYDROCODONE-ACETAMINOPHEN 5-325 MG PO TABS
1.0000 | ORAL_TABLET | Freq: Once | ORAL | Status: AC
  Administered 2014-09-09: 1 via ORAL
  Filled 2014-09-09: qty 1
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
## 2014-09-09 NOTE — Progress Notes (Signed)
[facility] Community Liaison Stacy, ° °Provided pt with a list of primary care resources to help patient establish a pcp.  °

## 2014-09-09 NOTE — ED Notes (Signed)
Pt reports chronic bilateral knee pain that has increased over the last week. R/hip pain is recurrent due to old injury. Treated with OTC medications, no decrease in pain

## 2014-09-09 NOTE — Discharge Instructions (Signed)
Hip Bursitis Bursitis is a swelling and soreness (inflammation) of a fluid-filled sac (bursa). This sac overlies and protects the joints.  CAUSES   Injury.  Overuse of the muscles surrounding the joint.  Arthritis.  Gout.  Infection.  Cold weather.  Inadequate warm-up and conditioning prior to activities. The cause may not be known.  SYMPTOMS   Mild to severe irritation.  Tenderness and swelling over the outside of the hip.  Pain with motion of the hip.  If the bursa becomes infected, a fever may be present. Redness, tenderness, and warmth will develop over the hip. Symptoms usually lessen in 3 to 4 weeks with treatment, but can come back. TREATMENT If conservative treatment does not work, your caregiver may advise draining the bursa and injecting cortisone into the area. This may speed up the healing process. This may also be used as an initial treatment of choice. HOME CARE INSTRUCTIONS   Apply ice to the affected area for 15-20 minutes every 3 to 4 hours while awake for the first 2 days. Put the ice in a plastic bag and place a towel between the bag of ice and your skin.  Rest the painful joint as much as possible, but continue to put the joint through a normal range of motion at least 4 times per day. When the pain lessens, begin normal, slow movements and usual activities to help prevent stiffness of the hip.  Only take over-the-counter or prescription medicines for pain, discomfort, or fever as directed by your caregiver.  Use crutches to limit weight bearing on the hip joint, if advised.  Elevate your painful hip to reduce swelling. Use pillows for propping and cushioning your legs and hips.  Gentle massage may provide comfort and decrease swelling. SEEK IMMEDIATE MEDICAL CARE IF:   Your pain increases even during treatment, or you are not improving.  You have a fever.  You have heat and inflammation over the involved bursa.  You have any other questions or  concerns. MAKE SURE YOU:   Understand these instructions.  Will watch your condition.  Will get help right away if you are not doing well or get worse. Document Released: 06/08/2002 Document Revised: 03/10/2012 Document Reviewed: 01/05/2009 ExitCare Patient Information 2015 [employer]. This information is not intended to replace advice given to you by your health care provider. Make sure you discuss any questions you have with your health care provider.  

## 2014-09-09 NOTE — ED Provider Notes (Signed)
Medical screening examination/treatment/procedure(s) were performed by non-physician practitioner and as supervising physician I was immediately available for consultation/collaboration.  Ernestina Patches, MD 09/09/14 2019

## 2014-11-01 ENCOUNTER — Encounter (HOSPITAL_COMMUNITY): Payer: Self-pay | Admitting: Emergency Medicine

## 2015-03-17 ENCOUNTER — Ambulatory Visit
Admission: RE | Admit: 2015-03-17 | Discharge: 2015-03-17 | Disposition: A | Discharge status: Home / Self Care | Payer: Medicaid Other | Source: Ambulatory Visit | Attending: Anesthesiology | Admitting: Anesthesiology

## 2015-03-17 ENCOUNTER — Other Ambulatory Visit: Payer: Self-pay | Admitting: Anesthesiology

## 2015-03-17 DIAGNOSIS — M545 Low back pain: Secondary | ICD-10-CM

## 2015-07-08 ENCOUNTER — Other Ambulatory Visit: Payer: Self-pay | Admitting: Anesthesiology

## 2015-07-08 ENCOUNTER — Ambulatory Visit
Admission: RE | Admit: 2015-07-08 | Discharge: 2015-07-08 | Disposition: A | Discharge status: Home / Self Care | Payer: Medicaid Other | Source: Ambulatory Visit | Attending: Anesthesiology | Admitting: Anesthesiology

## 2015-07-08 DIAGNOSIS — M79602 Pain in left arm: Secondary | ICD-10-CM

## 2015-07-08 DIAGNOSIS — M542 Cervicalgia: Secondary | ICD-10-CM

## 2015-09-01 ENCOUNTER — Other Ambulatory Visit: Payer: Self-pay | Admitting: Pain Medicine

## 2015-09-01 DIAGNOSIS — Z803 Family history of malignant neoplasm of breast: Secondary | ICD-10-CM | POA: Insufficient documentation

## 2015-09-01 DIAGNOSIS — M545 Low back pain: Secondary | ICD-10-CM

## 2015-09-06 ENCOUNTER — Other Ambulatory Visit: Payer: Medicaid Other

## 2015-09-16 ENCOUNTER — Other Ambulatory Visit: Payer: Medicaid Other

## 2015-09-26 ENCOUNTER — Ambulatory Visit
Admission: RE | Admit: 2015-09-26 | Discharge: 2015-09-26 | Disposition: A | Discharge status: Home / Self Care | Payer: Medicaid Other | Source: Ambulatory Visit | Attending: Pain Medicine | Admitting: Pain Medicine

## 2015-09-26 DIAGNOSIS — M545 Low back pain: Secondary | ICD-10-CM

## 2015-11-08 ENCOUNTER — Encounter (HOSPITAL_BASED_OUTPATIENT_CLINIC_OR_DEPARTMENT_OTHER): Payer: Self-pay | Admitting: *Deleted

## 2015-11-08 ENCOUNTER — Emergency Department (HOSPITAL_BASED_OUTPATIENT_CLINIC_OR_DEPARTMENT_OTHER)
Admission: EM | Admit: 2015-11-08 | Discharge: 2015-11-08 | Disposition: A | Discharge status: Home / Self Care | Payer: Medicaid Other | Attending: Emergency Medicine | Admitting: Emergency Medicine

## 2015-11-08 DIAGNOSIS — M1990 Unspecified osteoarthritis, unspecified site: Secondary | ICD-10-CM | POA: Insufficient documentation

## 2015-11-08 DIAGNOSIS — Z862 Personal history of diseases of the blood and blood-forming organs and certain disorders involving the immune mechanism: Secondary | ICD-10-CM | POA: Diagnosis not present

## 2015-11-08 DIAGNOSIS — R509 Fever, unspecified: Secondary | ICD-10-CM | POA: Diagnosis not present

## 2015-11-08 DIAGNOSIS — F909 Attention-deficit hyperactivity disorder, unspecified type: Secondary | ICD-10-CM | POA: Diagnosis not present

## 2015-11-08 DIAGNOSIS — Z8742 Personal history of other diseases of the female genital tract: Secondary | ICD-10-CM | POA: Insufficient documentation

## 2015-11-08 DIAGNOSIS — M2540 Effusion, unspecified joint: Secondary | ICD-10-CM | POA: Diagnosis present

## 2015-11-08 DIAGNOSIS — Z87828 Personal history of other (healed) physical injury and trauma: Secondary | ICD-10-CM | POA: Diagnosis not present

## 2015-11-08 DIAGNOSIS — I10 Essential (primary) hypertension: Secondary | ICD-10-CM | POA: Diagnosis not present

## 2015-11-08 DIAGNOSIS — Z720 Tobacco use: Secondary | ICD-10-CM | POA: Insufficient documentation

## 2015-11-08 DIAGNOSIS — Z7982 Long term (current) use of aspirin: Secondary | ICD-10-CM | POA: Insufficient documentation

## 2015-11-08 DIAGNOSIS — M419 Scoliosis, unspecified: Secondary | ICD-10-CM | POA: Diagnosis not present

## 2015-11-08 DIAGNOSIS — G43909 Migraine, unspecified, not intractable, without status migrainosus: Secondary | ICD-10-CM | POA: Insufficient documentation

## 2015-11-08 DIAGNOSIS — Z856 Personal history of leukemia: Secondary | ICD-10-CM | POA: Insufficient documentation

## 2015-11-08 DIAGNOSIS — M2550 Pain in unspecified joint: Secondary | ICD-10-CM | POA: Diagnosis not present

## 2015-11-08 DIAGNOSIS — F329 Major depressive disorder, single episode, unspecified: Secondary | ICD-10-CM | POA: Insufficient documentation

## 2015-11-08 DIAGNOSIS — Z792 Long term (current) use of antibiotics: Secondary | ICD-10-CM | POA: Insufficient documentation

## 2015-11-08 DIAGNOSIS — J45909 Unspecified asthma, uncomplicated: Secondary | ICD-10-CM | POA: Insufficient documentation

## 2015-11-08 DIAGNOSIS — Z7951 Long term (current) use of inhaled steroids: Secondary | ICD-10-CM | POA: Diagnosis not present

## 2015-11-08 DIAGNOSIS — Z90710 Acquired absence of both cervix and uterus: Secondary | ICD-10-CM | POA: Diagnosis not present

## 2015-11-08 DIAGNOSIS — L989 Disorder of the skin and subcutaneous tissue, unspecified: Secondary | ICD-10-CM | POA: Insufficient documentation

## 2015-11-08 LAB — CBC WITH DIFFERENTIAL/PLATELET
Basophils Absolute: 0 10*3/uL (ref 0.0–0.1)
Basophils Relative: 0 %
Eosinophils Absolute: 0.1 10*3/uL (ref 0.0–0.7)
Eosinophils Relative: 2 %
HEMATOCRIT: 38.5 % (ref 36.0–46.0)
HEMOGLOBIN: 12.7 g/dL (ref 12.0–15.0)
Lymphocytes Relative: 32 %
Lymphs Abs: 1.7 10*3/uL (ref 0.7–4.0)
MCH: 28.9 pg (ref 26.0–34.0)
MCHC: 33 g/dL (ref 30.0–36.0)
MCV: 87.7 fL (ref 78.0–100.0)
MONO ABS: 0.4 10*3/uL (ref 0.1–1.0)
MONOS PCT: 6 %
NEUTROS PCT: 60 %
Neutro Abs: 3.2 10*3/uL (ref 1.7–7.7)
Platelets: 207 10*3/uL (ref 150–400)
RBC: 4.39 MIL/uL (ref 3.87–5.11)
RDW: 12.1 % (ref 11.5–15.5)
WBC: 5.4 10*3/uL (ref 4.0–10.5)

## 2015-11-08 LAB — BASIC METABOLIC PANEL
Anion gap: 4 — ABNORMAL LOW (ref 5–15)
BUN: 15 mg/dL (ref 6–20)
CHLORIDE: 106 mmol/L (ref 101–111)
CO2: 27 mmol/L (ref 22–32)
CREATININE: 0.59 mg/dL (ref 0.44–1.00)
Calcium: 8.6 mg/dL — ABNORMAL LOW (ref 8.9–10.3)
GFR calc Af Amer: >60 mL/min (ref >60)
GFR calc non Af Amer: >60 mL/min (ref >60)
GLUCOSE: 114 mg/dL — AB (ref 65–99)
Potassium: 3.5 mmol/L (ref 3.5–5.1)
Sodium: 137 mmol/L (ref 135–145)

## 2015-11-08 MED ORDER — IBUPROFEN 800 MG PO TABS
800.0000 mg | ORAL_TABLET | Freq: Once | ORAL | Status: AC
  Administered 2015-11-08: 800 mg via ORAL
  Filled 2015-11-08: qty 1

## 2015-11-08 MED ORDER — DOXYCYCLINE HYCLATE 100 MG PO CAPS: 100.0000 mg | ORAL_CAPSULE | Freq: Two times a day (BID) | ORAL | Status: DC

## 2015-11-08 NOTE — ED Notes (Signed)
Swelling and pain in her joints for a week. She was seen by her MD and no diagnosis was found.

## 2015-11-08 NOTE — ED Provider Notes (Signed)
CSN: [id number]     Arrival date & time 11/08/15  1049 History   First MD Initiated Contact with Patient 11/08/15 1119     Chief Complaint  Patient presents with  . Joint Swelling     (Consider location/radiation/quality/duration/timing/severity/associated sxs/prior Treatment) HPI Is a 34 year old Guzman who comes in today complaining of diffuse joint pain and swelling that began 1 week ago. She was seen by her primary care physician. She states she was told it was probably a viral illness. She she had some type of insect bite prior to this on her left thigh. Should not see what was states that there was a round red lesion that then became here in the Center. She states she still has some scarring in that area. She thinks it may have been a spider. She says that the joint pain and swelling has improved and is not visibly swollen now. She described it as being in both of her shoulders both her elbows all her hand joints but her knees and pain in the arch of her feet. She did not have a fever with this. She does not have any known autoimmune disorder. She had a history of leukemia at age 84. Denies IV drug use Past Medical History  Diagnosis Date  . Anemia   . ADHD (attention deficit hyperactivity disorder)   . Back pain   . Scoliosis   . Leukemia ([place]) at age 73  . Headache(784.0)   . Endometriosis   . Hydradenitis   . S/P total abdominal hysterectomy 06/26/2011  . Thyroid disease   . MRSA (methicillin resistant staph aureus) culture positive 2010    ear infection that cultured out MRSA   . Hydradenitis 07/13/2011  . Abdominal wall mass   . Arthritis   . Chest pain   . Migraines   . Diarrhea   . Nausea   . Asthma   . Hypertension     on inderal  . Depression     tried several meds, not on any now   Past Surgical History  Procedure Laterality Date  . Laparoscopic endometriosis fulguration  06/1999  . Axillary hidradenitis excision      bilateral  . Cesarean section  08/31/2006,  08/26/2007  . Abdominal hysterectomy  06/30/2008    partial  . Mass excision  06/19/2012    Procedure: EXCISION MASS;  Surgeon: Merrie Roof, MD;  Location: [facility];  Service: General;  Laterality: Right;  excision mass right abdominal wall   Family History  Problem Relation Age of Onset  . Heart disease Mother   . Diabetes Mother   . Hypertension Mother   . Heart disease Father   . Diabetes Father   . Hypertension Father   . Bipolar disorder Sister   . Bipolar disorder Brother   . Cancer Brother     prostate  . Pancreatic cancer Maternal Grandmother   . Cancer Maternal Grandmother     pancreatic  . Cancer Maternal Uncle     lung  . Cancer Paternal Aunt    Social History  Substance Use Topics  . Smoking status: Current Every Day Smoker -- 1.00 packs/day for 11 years    Types: Cigarettes  . Smokeless tobacco: Never Used     Comment: too much stress  . Alcohol Use: No   OB History    Gravida Para Term Preterm AB TAB SAB Ectopic Multiple Living   2 2  2  2     Review of Systems  All other systems reviewed and are negative.     Allergies  Sulfonamide derivatives; Aleve; Dilaudid; Morphine and related; and Tramadol  Home Medications   Prior to Admission medications   Medication Sig Start Date End Date Taking? Authorizing Provider  fluticasone (FLONASE) 50 MCG/ACT nasal spray Place into both nostrils daily.   Yes Historical Provider, MD  Gabapentin Enacarbil (HORIZANT PO) Take by mouth.   Yes Historical Provider, MD  Hydrocodone-Acetaminophen (VICODIN PO) Take by mouth.   Yes Historical Provider, MD  metroNIDAZOLE (METROGEL) 1 % gel Apply topically daily.   Yes Historical Provider, MD  prazosin (MINIPRESS) 2 MG capsule Take 2 mg by mouth at bedtime.   Yes Historical Provider, MD  Sertraline HCl (ZOLOFT PO) Take by mouth.   Yes Historical Provider, MD  albuterol (PROVENTIL HFA;VENTOLIN HFA) 108 (90 BASE) MCG/ACT inhaler Inhale 2 puffs into the  lungs every 6 (six) hours as needed. Wheezing.    Historical Provider, MD  ALPRAZolam Duanne Moron) 0.25 MG tablet Take 0.125 mg by mouth at bedtime as needed for anxiety.    Historical Provider, MD  aspirin-acetaminophen-caffeine (EXCEDRIN MIGRAINE) [phone] MG per tablet Take 2 tablets by mouth every 8 (eight) hours as needed for migraine.     Historical Provider, MD  beclomethasone (QVAR) 40 MCG/ACT inhaler Inhale 2 puffs into the lungs 2 (two) times daily.  01/21/14   Historical Provider, MD  diclofenac sodium (VOLTAREN) 1 % GEL Place 2 g onto the skin as needed (for knee pain).  01/21/14   Historical Provider, MD  lisdexamfetamine (VYVANSE) 20 MG capsule Take 40 mg by mouth daily.     Historical Provider, MD  lisinopril-hydrochlorothiazide (PRINZIDE,ZESTORETIC) 10-12.5 MG per tablet Take 1 tablet by mouth daily.    Historical Provider, MD  propranolol (INDERAL) 40 MG tablet Take 40 mg by mouth 2 (two) times daily.    Historical Provider, MD  tobramycin-dexamethasone [facility]) ophthalmic ointment Place 1 application into both eyes 3 (three) times daily.    Historical Provider, MD   BP 105/Erika mmHg  Pulse 76  Temp(Src) 98 F (36.7 C) (Oral)  Resp 16  Ht 5' (1.524 m)  Wt 141 lb (63.957 kg)  BMI 27.54 kg/m2  SpO2 96% Physical Exam  Constitutional: She is oriented to person, place, and time. She appears well-developed and well-nourished.  HENT:  Head: Normocephalic and atraumatic.  Right Ear: External ear normal.  Left Ear: External ear normal.  Nose: Nose normal.  Mouth/Throat: Oropharynx is clear and moist.  Eyes: Conjunctivae and EOM are normal. Pupils are equal, round, and reactive to light.  Neck: Normal range of motion. Neck supple. No JVD present. No tracheal deviation present. No thyromegaly present.  Cardiovascular: Normal rate, regular rhythm, normal heart sounds and intact distal pulses.   Pulmonary/Chest: Effort normal and breath sounds normal. She has no wheezes.  Abdominal:  Soft. Bowel sounds are normal. She exhibits no mass. There is no tenderness. There is no guarding.  Musculoskeletal: Normal range of motion. She exhibits no edema or tenderness.  No erythema, swelling, joint warmth or effusion noted on my exam No obvious rash noted.  Lymphadenopathy:    She has no cervical adenopathy.  Neurological: She is alert and oriented to person, place, and time. She has normal reflexes. No cranial nerve deficit or sensory deficit. Gait normal. GCS eye subscore is 4. GCS verbal subscore is 5. GCS motor subscore is 6.  Reflex Scores:  Bicep reflexes are 2+ on the right side and 2+ on the left side.      Patellar reflexes are 2+ on the right side and 2+ on the left side. Strength is normal and equal throughout. Cranial nerves grossly intact. Patient fluent. No gross ataxia and patient able to ambulate without difficulty.  Skin: Skin is warm and dry.  Psychiatric: She has a normal mood and affect. Her behavior is normal. Judgment and thought content normal.  Nursing note and vitals reviewed.   ED Course  Procedures (including critical care time) Labs Review Labs Reviewed  BASIC METABOLIC PANEL - Abnormal; Notable for the following:    Glucose, Bld 114 (*)    Calcium 8.6 (*)    Anion gap 4 (*)    All other components within normal limits  CBC WITH DIFFERENTIAL/PLATELET  ROCKY MTN SPOTTED FVR ABS PNL(IGG+IGM)    Imaging Review No results found. I have personally reviewed and evaluated these images and lab results as part of my medical decision-making.   EKG Interpretation None      MDM   Final diagnoses:  Joint pain    Labs checked here and essentially normal. Given patient's history of insect bite, document 1 fever titers were sent and she will be started on doxycycline. I advised her that she should have follow-up she is worse at any time, and follow-up if symptoms persist over 6 weeks she will likely require autoimmune workup. At this time, I  see no evidence of infection.  Pattricia Boss, MD 11/11/15 [phone]

## 2015-11-08 NOTE — Discharge Instructions (Signed)
Please follow up with your doctor if pain persists as further work up indicated if it does not resolve.    Joint Pain Joint pain can be caused by many things. The joint can be bruised, infected, weak from aging, or sore from exercise. The pain will probably go away if you follow your doctor's instructions for home care. If your joint pain continues, more tests may be needed to help find the cause of your condition. HOME CARE Watch your condition for any changes. Follow these instructions as told to lessen the pain that you are feeling:  Take medicines only as told by your doctor.  Rest the sore joint for as long as told by your doctor. If your doctor tells you to, raise (elevate) the painful joint above the level of your heart while you are sitting or lying down.  Do not do things that cause pain or make the pain worse.  If told, put ice on the painful area:  Put ice in a plastic bag.  Place a towel between your skin and the bag.  Leave the ice on for 20 minutes, 2-3 times per day.  Wear an elastic bandage, splint, or sling as told by your doctor. Loosen the bandage or splint if your fingers or toes lose feeling (become numb) and tingle, or if they turn cold and blue.  Begin exercising or stretching the joint as told by your doctor. Ask your doctor what types of exercise are safe for you.  Keep all follow-up visits as told by your doctor. This is important. GET HELP IF:  Your pain gets worse and medicine does not help it.  Your joint pain does not get better in 3 days.  You have more bruising or swelling.  You have a fever.  You lose 10 pounds (4.5 kg) or more without trying. GET HELP RIGHT AWAY IF:  You are not able to move the joint.  Your fingers or toes become numb or they turn cold and blue.   This information is not intended to replace advice given to you by your health care provider. Make sure you discuss any questions you have with your health care provider.     Document Released: 12/05/2009 Document Revised: 01/07/2015 Document Reviewed: 09/28/2014 Elsevier Interactive Patient Education [employer].

## 2015-11-09 ENCOUNTER — Telehealth (HOSPITAL_BASED_OUTPATIENT_CLINIC_OR_DEPARTMENT_OTHER): Payer: Self-pay | Admitting: Emergency Medicine

## 2015-11-09 LAB — ROCKY MTN SPOTTED FVR ABS PNL(IGG+IGM)
RMSF IgG: NEGATIVE
RMSF IgM: 1.46 index — ABNORMAL HIGH (ref 0.00–0.89)

## 2016-06-17 ENCOUNTER — Emergency Department (HOSPITAL_COMMUNITY)
Admission: EM | Admit: 2016-06-17 | Discharge: 2016-06-18 | Disposition: A | Discharge status: Home / Self Care | Payer: Medicaid Other | Attending: Emergency Medicine | Admitting: Emergency Medicine

## 2016-06-17 ENCOUNTER — Encounter (HOSPITAL_COMMUNITY): Payer: Self-pay | Admitting: *Deleted

## 2016-06-17 DIAGNOSIS — R0981 Nasal congestion: Secondary | ICD-10-CM | POA: Insufficient documentation

## 2016-06-17 DIAGNOSIS — R0602 Shortness of breath: Secondary | ICD-10-CM | POA: Diagnosis not present

## 2016-06-17 DIAGNOSIS — F329 Major depressive disorder, single episode, unspecified: Secondary | ICD-10-CM | POA: Diagnosis not present

## 2016-06-17 DIAGNOSIS — R05 Cough: Secondary | ICD-10-CM | POA: Diagnosis not present

## 2016-06-17 DIAGNOSIS — Z79899 Other long term (current) drug therapy: Secondary | ICD-10-CM | POA: Diagnosis not present

## 2016-06-17 DIAGNOSIS — R509 Fever, unspecified: Secondary | ICD-10-CM | POA: Diagnosis not present

## 2016-06-17 DIAGNOSIS — F17210 Nicotine dependence, cigarettes, uncomplicated: Secondary | ICD-10-CM | POA: Insufficient documentation

## 2016-06-17 DIAGNOSIS — R109 Unspecified abdominal pain: Secondary | ICD-10-CM | POA: Diagnosis not present

## 2016-06-17 DIAGNOSIS — I10 Essential (primary) hypertension: Secondary | ICD-10-CM | POA: Insufficient documentation

## 2016-06-17 DIAGNOSIS — Z7982 Long term (current) use of aspirin: Secondary | ICD-10-CM | POA: Diagnosis not present

## 2016-06-17 DIAGNOSIS — M7989 Other specified soft tissue disorders: Secondary | ICD-10-CM | POA: Diagnosis present

## 2016-06-17 DIAGNOSIS — M1990 Unspecified osteoarthritis, unspecified site: Secondary | ICD-10-CM | POA: Insufficient documentation

## 2016-06-17 DIAGNOSIS — M7581 Other shoulder lesions, right shoulder: Secondary | ICD-10-CM | POA: Diagnosis not present

## 2016-06-17 NOTE — ED Notes (Addendum)
Pt complains of swelling to feet, legs and stomach and  that became worse over the past week. Pt states she has developed sores under her arms over the past week. Pt states she has been retaining fluid for the past 3 months. Pt has been on steroids and diuretics but states her symptoms have not improved. Pt has hx of lupus. Pt complains of pain to her back and joints.

## 2016-06-18 ENCOUNTER — Emergency Department (HOSPITAL_COMMUNITY): Payer: Medicaid Other

## 2016-06-18 LAB — COMPREHENSIVE METABOLIC PANEL
ALT: 10 U/L — ABNORMAL LOW (ref 14–54)
ANION GAP: 7 (ref 5–15)
AST: 15 U/L (ref 15–41)
Albumin: 3.8 g/dL (ref 3.5–5.0)
Alkaline Phosphatase: 60 U/L (ref 38–126)
BUN: 13 mg/dL (ref 6–20)
CO2: 25 mmol/L (ref 22–32)
Calcium: 9.1 mg/dL (ref 8.9–10.3)
Chloride: 106 mmol/L (ref 101–111)
Creatinine, Ser: 0.74 mg/dL (ref 0.44–1.00)
GFR calc Af Amer: >60 mL/min (ref >60)
GFR calc non Af Amer: >60 mL/min (ref >60)
Glucose, Bld: 106 mg/dL — ABNORMAL HIGH (ref 65–99)
POTASSIUM: 4.1 mmol/L (ref 3.5–5.1)
SODIUM: 138 mmol/L (ref 135–145)
Total Bilirubin: 0.5 mg/dL (ref 0.3–1.2)
Total Protein: 6.6 g/dL (ref 6.5–8.1)

## 2016-06-18 LAB — CBC WITH DIFFERENTIAL/PLATELET
BASOS ABS: 0.1 10*3/uL (ref 0.0–0.1)
Basophils Relative: 1 %
EOS PCT: 2 %
Eosinophils Absolute: 0.2 10*3/uL (ref 0.0–0.7)
HCT: 44.8 % (ref 36.0–46.0)
Hemoglobin: 14.6 g/dL (ref 12.0–15.0)
Lymphocytes Relative: 28 %
Lymphs Abs: 2.9 10*3/uL (ref 0.7–4.0)
MCH: 29.7 pg (ref 26.0–34.0)
MCHC: 32.6 g/dL (ref 30.0–36.0)
MCV: 91.2 fL (ref 78.0–100.0)
MONO ABS: 0.8 10*3/uL (ref 0.1–1.0)
Monocytes Relative: 8 %
Neutro Abs: 6.3 10*3/uL (ref 1.7–7.7)
Neutrophils Relative %: 61 %
PLATELETS: 244 10*3/uL (ref 150–400)
RBC: 4.91 MIL/uL (ref 3.87–5.11)
RDW: 12.6 % (ref 11.5–15.5)
WBC: 10.2 10*3/uL (ref 4.0–10.5)

## 2016-06-18 LAB — BRAIN NATRIURETIC PEPTIDE: B NATRIURETIC PEPTIDE 5: 31.5 pg/mL (ref 0.0–100.0)

## 2016-06-18 MED ORDER — CLINDAMYCIN HCL 150 MG PO CAPS: 450.0000 mg | ORAL_CAPSULE | Freq: Four times a day (QID) | ORAL | Status: DC

## 2016-06-18 NOTE — ED Provider Notes (Signed)
CSN: [id number]     Arrival date & time 06/17/16  2214 History  By signing my name below, I, Evelene Croon, attest that this documentation has been prepared under the direction and in the presence of Deno Etienne, DO . Electronically Signed: Evelene Croon, Scribe. 06/18/2016. 1:24 AM.    Chief Complaint  Patient presents with  . Leg Swelling    The history is provided by the patient. No language interpreter was used.   HPI Comments:  [name] is a 35 y.o. female with a history of Lupus (diagnosed Nov 2016) who presents to the Emergency Department complaining of constant, unchanged, BLE swelling that has worsened over the last week. Pt reports associated  subjective fever, chills, cough, congestion, abdominal pain and abdominal swelling after eating. No alleviating factors noted. 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 Pt is a current smoker   PCP: [facility]   Past Medical History  Diagnosis Date  . Anemia   . ADHD (attention deficit hyperactivity disorder)   . Back pain   . Scoliosis   . Leukemia ([place]) at age 15  . Headache(784.0)   . Endometriosis   . Hydradenitis   . S/P total abdominal hysterectomy 06/26/2011  . Thyroid disease   . MRSA (methicillin resistant staph aureus) culture positive 2010    ear infection that cultured out MRSA   . Hydradenitis 07/13/2011  . Abdominal wall mass   . Arthritis   . Chest pain   . Migraines   . Diarrhea   . Nausea   . Asthma   . Hypertension     on inderal  . Depression     tried several meds, not on any now   Past Surgical History  Procedure Laterality Date  . Laparoscopic endometriosis fulguration  06/1999  . Axillary hidradenitis excision      bilateral  . Cesarean section  08/31/2006, 08/26/2007  . Abdominal hysterectomy  06/30/2008    partial  . Mass excision  06/19/2012    Procedure: EXCISION MASS;  Surgeon: Merrie Roof, MD;  Location: [facility];   Service: General;  Laterality: Right;  excision mass right abdominal wall   Family History  Problem Relation Age of Onset  . Heart disease Mother   . Diabetes Mother   . Hypertension Mother   . Heart disease Father   . Diabetes Father   . Hypertension Father   . Bipolar disorder Sister   . Bipolar disorder Brother   . Cancer Brother     prostate  . Pancreatic cancer Maternal Grandmother   . Cancer Maternal Grandmother     pancreatic  . Cancer Maternal Uncle     lung  . Cancer Paternal Aunt    Social History  Substance Use Topics  . Smoking status: Current Every Day Smoker -- 1.00 packs/day for 11 years    Types: Cigarettes  . Smokeless tobacco: Never Used     Comment: too much stress  . Alcohol Use: No   OB History    Gravida Para Term Preterm AB TAB SAB Ectopic Multiple Living   2 2  2      2      Review of Systems  Constitutional: Positive for fever and chills.  HENT: Positive for congestion.   Respiratory: Positive for cough.   Cardiovascular: Positive for leg swelling.  Gastrointestinal: Positive for abdominal pain and abdominal distention.  Skin: Positive for  rash.  All other systems reviewed and are negative.   Allergies  Bee venom; Shrimp; Sulfonamide derivatives; Aleve; Dilaudid; Morphine and related; and Tramadol  Home Medications   Prior to Admission medications   Medication Sig Start Date End Date Taking? Authorizing Provider  albuterol (PROVENTIL HFA;VENTOLIN HFA) 108 (90 BASE) MCG/ACT inhaler Inhale 2 puffs into the lungs every 6 (six) hours as needed. Wheezing.   Yes Historical Provider, MD  aspirin-acetaminophen-caffeine (EXCEDRIN MIGRAINE) [phone] MG per tablet Take 2 tablets by mouth every 8 (eight) hours as needed for migraine.    Yes Historical Provider, MD  beclomethasone (QVAR) 40 MCG/ACT inhaler Inhale 2 puffs into the lungs 2 (two) times daily.  01/21/14  Yes Historical Provider, MD  diclofenac sodium (VOLTAREN) 1 % GEL Place 2 g onto the  skin as needed (for knee pain).  01/21/14  Yes Historical Provider, MD  EPINEPHrine (EPIPEN 2-PAK) 0.3 mg/0.3 mL IJ SOAJ injection Inject 0.3 mg into the muscle once.   Yes Historical Provider, MD  fluticasone (FLONASE) 50 MCG/ACT nasal spray Place into both nostrils daily.   Yes Historical Provider, MD  furosemide (LASIX) 40 MG tablet Take 40 mg by mouth daily.   Yes Historical Provider, MD  Hydrocodone-Acetaminophen (VICODIN PO) Take 5-325 mg by mouth 4 (four) times daily.    Yes Historical Provider, MD  hydroxychloroquine (PLAQUENIL) 200 MG tablet Take 200 mg by mouth 2 (two) times daily.   Yes Historical Provider, MD  lisdexamfetamine (VYVANSE) 20 MG capsule Take 40 mg by mouth daily.    Yes Historical Provider, MD  metroNIDAZOLE (METROGEL) 1 % gel Apply topically daily.   Yes Historical Provider, MD  potassium chloride SA (K-DUR,KLOR-CON) 20 MEQ tablet Take 20 mEq by mouth daily.   Yes Historical Provider, MD  prazosin (MINIPRESS) 2 MG capsule Take 2 mg by mouth at bedtime.   Yes Historical Provider, MD  Pregabalin (LYRICA PO) Take by mouth.   Yes Historical Provider, MD  propranolol (INDERAL) 40 MG tablet Take 1 tablet by mouth 2 (two) times daily. 12/07/14  Yes Historical Provider, MD  sertraline (ZOLOFT) 50 MG tablet Take 1 tablet by mouth daily.   Yes Historical Provider, MD  Vitamin D, Ergocalciferol, (DRISDOL) 50000 units CAPS capsule Take 50,000 Units by mouth 2 (two) times a week.   Yes Historical Provider, MD  clindamycin (CLEOCIN) 150 MG capsule Take 3 capsules (450 mg total) by mouth every 6 (six) hours. 06/18/16   Deno Etienne, DO  doxycycline (VIBRAMYCIN) 100 MG capsule Take 1 capsule (100 mg total) by mouth 2 (two) times daily. 11/08/15   Pattricia Boss, MD   BP 117/82 mmHg  Pulse 84  Temp(Src) 98.3 F (36.8 C) (Oral)  Resp 17  SpO2 95% Physical Exam  Constitutional: She is oriented to person, place, and time. She appears well-developed and well-nourished. No distress.  HENT:   Head: Normocephalic and atraumatic.  Eyes: EOM are normal. Pupils are equal, round, and reactive to light.  Neck: Normal range of motion. Neck supple.  Cardiovascular: Normal rate and regular rhythm.  Exam reveals no gallop and no friction rub.   No murmur heard. Pulmonary/Chest: Effort normal. She has no wheezes. She has no rales.  Abdominal: Soft. She exhibits no distension. There is no tenderness.  Musculoskeletal: She exhibits edema. She exhibits no tenderness.  Trace pitting edema bilaterally   Neurological: She is alert and oriented to person, place, and time.  Skin: Skin is warm and dry. She is not diaphoretic.  8 small erythematous lesions to right axilla; no noted fluctuance, no surrounding induration   Psychiatric: She has a normal mood and affect. Her behavior is normal.  Nursing note and vitals reviewed.   ED Course  Procedures DIAGNOSTIC STUDIES:  Oxygen Saturation is 95% on RA, adequate  by my interpretation.    COORDINATION OF CARE:  12:10 AM Discussed treatment plan with pt at bedside and pt agreed to plan.  Labs Review Labs Reviewed  COMPREHENSIVE METABOLIC PANEL - Abnormal; Notable for the following:    Glucose, Bld 106 (*)    ALT 10 (*)    All other components within normal limits  CBC WITH DIFFERENTIAL/PLATELET  BRAIN NATRIURETIC PEPTIDE    Imaging Review Dg Chest 2 View  06/18/2016  CLINICAL DATA:  35 year old female with shortness of breath and wheezing and leg swelling. EXAM: CHEST  2 VIEW COMPARISON:  Chest radiograph dated 10/07/2013 FINDINGS: The heart size and mediastinal contours are within normal limits. Both lungs are clear. The visualized skeletal structures are unremarkable. IMPRESSION: No active cardiopulmonary disease. Electronically Signed   By: Anner Crete M.D.   On: 06/18/2016 01:40   I have personally reviewed and evaluated these images and lab results as part of my medical decision-making.   EKG Interpretation None      MDM    Final diagnoses:  Leg swelling   Discussed smoking cessation with patient and was they were offerred resources to help stop.  Total time was 5 min CPT code 99406.   35 yo F With a chief complaint of bilateral lower extremity edema. This been going on for about a month. Patient was recently diagnosed with lupus. CBC CMP and BNP unremarkable. Chest x-ray with no heart enlargement no edema. Only trace edema on my exam. Will have her follow with her PCP.   Patient was also complaining of abscesses to her right axilla. Nothing that I feel can be I&D at this time. Start on clinda.  5:05 AM:  I have discussed the diagnosis/risks/treatment options with the patient and believe the pt to be eligible for discharge home to follow-up with PCP. We also discussed returning to the ED immediately if new or worsening sx occur. We discussed the sx which are most concerning (e.g., sudden worsening pain, fever, inability to tolerate by mouth) that necessitate immediate return. Medications administered to the patient during their visit and any new prescriptions provided to the patient are listed below.  Medications given during this visit Medications - No data to display  Discharge Medication List as of 06/18/2016  1:48 AM    START taking these medications   Details  clindamycin (CLEOCIN) 150 MG capsule Take 3 capsules (450 mg total) by mouth every 6 (six) hours., Starting 06/18/2016, Until Discontinued, Print        The patient appears reasonably screen and/or stabilized for discharge and I doubt any other medical condition or other [facility] requiring further screening, evaluation, or treatment in the ED at this time prior to discharge.     I personally performed the services described in this documentation, which was scribed in my presence. The recorded information has been reviewed and is accurate.    Deno Etienne, DO 06/18/16 0505

## 2016-06-18 NOTE — Discharge Instructions (Signed)
Edema °Edema is an abnormal buildup of fluids in your body tissues. Edema is somewhat dependent on gravity to pull the fluid to the lowest place in your body. That makes the condition more common in the legs and thighs (lower extremities). Painless swelling of the feet and ankles is common and becomes more likely as you get older. It is also common in looser tissues, like around your eyes.  °When the affected area is squeezed, the fluid may move out of that spot and leave a dent for a few moments. This dent is called pitting.  °CAUSES  °There are many possible causes of edema. Eating too much salt and being on your feet or sitting for a long time can cause edema in your legs and ankles. Hot weather may make edema worse. Common medical causes of edema include: °· Heart failure. °· Liver disease. °· Kidney disease. °· Weak blood vessels in your legs. °· Cancer. °· An injury. °· Pregnancy. °· Some medications. °· Obesity.  °SYMPTOMS  °Edema is usually painless. Your skin may look swollen or shiny.  °DIAGNOSIS  °Your health care provider may be able to diagnose edema by asking about your medical history and doing a physical exam. You may need to have tests such as X-rays, an electrocardiogram, or blood tests to check for medical conditions that may cause edema.  °TREATMENT  °Edema treatment depends on the cause. If you have heart, liver, or kidney disease, you need the treatment appropriate for these conditions. General treatment may include: °· Elevation of the affected body part above the level of your heart. °· Compression of the affected body part. Pressure from elastic bandages or support stockings squeezes the tissues and forces fluid back into the blood vessels. This keeps fluid from entering the tissues. °· Restriction of fluid and salt intake. °· Use of a water pill (diuretic). These medications are appropriate only for some types of edema. They pull fluid out of your body and make you urinate more often. This  gets rid of fluid and reduces swelling, but diuretics can have side effects. Only use diuretics as directed by your health care provider. °HOME CARE INSTRUCTIONS  °· Keep the affected body part above the level of your heart when you are lying down.   °· Do not sit still or stand for prolonged periods.   °· Do not put anything directly under your knees when lying down. °· Do not wear constricting clothing or garters on your upper legs.   °· Exercise your legs to work the fluid back into your blood vessels. This may help the swelling go down.   °· Wear elastic bandages or support stockings to reduce ankle swelling as directed by your health care provider.   °· Eat a low-salt diet to reduce fluid if your health care provider recommends it.   °· Only take medicines as directed by your health care provider.  °SEEK MEDICAL CARE IF:  °· Your edema is not responding to treatment. °· You have heart, liver, or kidney disease and notice symptoms of edema. °· You have edema in your legs that does not improve after elevating them.   °· You have sudden and unexplained weight gain. °SEEK IMMEDIATE MEDICAL CARE IF:  °· You develop shortness of breath or chest pain.   °· You cannot breathe when you lie down. °· You develop pain, redness, or warmth in the swollen areas.   °· You have heart, liver, or kidney disease and suddenly get edema. °· You have a fever and your symptoms suddenly get worse. °MAKE SURE YOU:  °·   Understand these instructions. °· Will watch your condition. °· Will get help right away if you are not doing well or get worse. °  °This information is not intended to replace advice given to you by your health care provider. Make sure you discuss any questions you have with your health care provider. °  °Document Released: 12/17/2005 Document Revised: 01/07/2015 Document Reviewed: 10/09/2013 °Elsevier Interactive Patient Education ©2016 [employer] ° °

## 2016-11-01 ENCOUNTER — Encounter (HOSPITAL_COMMUNITY): Payer: Self-pay | Admitting: *Deleted

## 2016-11-01 ENCOUNTER — Emergency Department (HOSPITAL_COMMUNITY)
Admission: EM | Admit: 2016-11-01 | Discharge: 2016-11-01 | Disposition: A | Discharge status: Home / Self Care | Payer: No Typology Code available for payment source | Attending: Emergency Medicine | Admitting: Emergency Medicine

## 2016-11-01 DIAGNOSIS — Z7982 Long term (current) use of aspirin: Secondary | ICD-10-CM | POA: Diagnosis not present

## 2016-11-01 DIAGNOSIS — F17210 Nicotine dependence, cigarettes, uncomplicated: Secondary | ICD-10-CM | POA: Diagnosis not present

## 2016-11-01 DIAGNOSIS — J45909 Unspecified asthma, uncomplicated: Secondary | ICD-10-CM | POA: Insufficient documentation

## 2016-11-01 DIAGNOSIS — Y999 Unspecified external cause status: Secondary | ICD-10-CM | POA: Diagnosis not present

## 2016-11-01 DIAGNOSIS — I10 Essential (primary) hypertension: Secondary | ICD-10-CM | POA: Diagnosis not present

## 2016-11-01 DIAGNOSIS — Y92481 Parking lot as the place of occurrence of the external cause: Secondary | ICD-10-CM | POA: Diagnosis not present

## 2016-11-01 DIAGNOSIS — M545 Low back pain: Secondary | ICD-10-CM | POA: Diagnosis not present

## 2016-11-01 DIAGNOSIS — M791 Myalgia: Secondary | ICD-10-CM | POA: Diagnosis not present

## 2016-11-01 DIAGNOSIS — F909 Attention-deficit hyperactivity disorder, unspecified type: Secondary | ICD-10-CM | POA: Diagnosis not present

## 2016-11-01 DIAGNOSIS — Y939 Activity, unspecified: Secondary | ICD-10-CM | POA: Insufficient documentation

## 2016-11-01 DIAGNOSIS — M7918 Myalgia, other site: Secondary | ICD-10-CM
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
## 2016-11-01 NOTE — Discharge Instructions (Signed)
Please follow with your primary care doctor in the next 2 days for a check-up. They must obtain records for further management.  ° °Do not hesitate to return to the Emergency Department for any new, worsening or concerning symptoms.  ° °

## 2017-02-04 ENCOUNTER — Telehealth: Payer: Self-pay | Admitting: Rheumatology

## 2017-02-04 NOTE — Telephone Encounter (Signed)
Patient states Dr. Estanislado Pandy did not want ehr getting any pain injections from her pain management doctor and the patient is requesting a note that states that. Please advise.

## 2017-02-15 NOTE — Telephone Encounter (Signed)
Attempted to contact the patient and left message for patient to call the office.  

## 2017-02-15 NOTE — Telephone Encounter (Signed)
When we reviewed Dr. Arlean Hopping office note from 04/26/2016, there was no indication that she would write a note stating that she did not want the patient to get injections from her pain doctor.We do see an entry that states patient felt that she was having weight can after getting cortisone injections.Patient will need to discuss with her pain doctor and they can decide together how to proceed forward.

## 2017-02-18 ENCOUNTER — Telehealth: Payer: Self-pay | Admitting: Rheumatology

## 2017-02-18 NOTE — Telephone Encounter (Signed)
Patient returned your call from Friday.  Cb#[phone].  Thank you

## 2017-02-18 NOTE — Telephone Encounter (Signed)
Returned patient's call. See previous call note.

## 2017-02-18 NOTE — Telephone Encounter (Signed)
Patient advised we are unable to provide a letter to her pain doctor stating that she note receive the injections as there is no documentation from Dr. Estanislado Pandy stating that she should not have them. Patient verbalized understanding.

## 2017-09-09 ENCOUNTER — Ambulatory Visit
Admission: RE | Admit: 2017-09-09 | Discharge: 2017-09-09 | Disposition: A | Discharge status: Home / Self Care | Payer: MEDICAID

## 2017-09-09 DIAGNOSIS — J45909 Unspecified asthma, uncomplicated: Principal | ICD-10-CM

## 2017-09-09 DIAGNOSIS — R918 Other nonspecific abnormal finding of lung field: Principal | ICD-10-CM

## 2017-11-28 ENCOUNTER — Encounter (HOSPITAL_COMMUNITY): Payer: Self-pay | Admitting: Emergency Medicine

## 2017-11-28 ENCOUNTER — Emergency Department (HOSPITAL_COMMUNITY)
Admission: EM | Admit: 2017-11-28 | Discharge: 2017-11-28 | Disposition: A | Discharge status: Home / Self Care | Payer: Medicaid Other | Attending: Emergency Medicine | Admitting: Emergency Medicine

## 2017-11-28 DIAGNOSIS — F17210 Nicotine dependence, cigarettes, uncomplicated: Secondary | ICD-10-CM | POA: Diagnosis not present

## 2017-11-28 DIAGNOSIS — J45909 Unspecified asthma, uncomplicated: Secondary | ICD-10-CM | POA: Diagnosis not present

## 2017-11-28 DIAGNOSIS — F4310 Post-traumatic stress disorder, unspecified: Secondary | ICD-10-CM | POA: Insufficient documentation

## 2017-11-28 DIAGNOSIS — E079 Disorder of thyroid, unspecified: Secondary | ICD-10-CM | POA: Diagnosis not present

## 2017-11-28 DIAGNOSIS — I10 Essential (primary) hypertension: Secondary | ICD-10-CM | POA: Insufficient documentation

## 2017-11-28 DIAGNOSIS — F4321 Adjustment disorder with depressed mood: Secondary | ICD-10-CM | POA: Diagnosis not present

## 2017-11-28 DIAGNOSIS — F329 Major depressive disorder, single episode, unspecified: Secondary | ICD-10-CM | POA: Diagnosis present

## 2017-11-28 DIAGNOSIS — Z79899 Other long term (current) drug therapy: Secondary | ICD-10-CM | POA: Diagnosis not present

## 2017-11-28 LAB — CBC WITH DIFFERENTIAL/PLATELET
BASOS PCT: 1 %
Basophils Absolute: 0.1 10*3/uL (ref 0.0–0.1)
EOS ABS: 0.2 10*3/uL (ref 0.0–0.7)
Eosinophils Relative: 2 %
HEMATOCRIT: 44.2 % (ref 36.0–46.0)
HEMOGLOBIN: 15 g/dL (ref 12.0–15.0)
LYMPHS ABS: 2.8 10*3/uL (ref 0.7–4.0)
Lymphocytes Relative: 29 %
MCH: 29.4 pg (ref 26.0–34.0)
MCHC: 33.9 g/dL (ref 30.0–36.0)
MCV: 86.7 fL (ref 78.0–100.0)
MONO ABS: 0.5 10*3/uL (ref 0.1–1.0)
MONOS PCT: 6 %
NEUTROS ABS: 6 10*3/uL (ref 1.7–7.7)
Neutrophils Relative %: 62 %
Platelets: 243 10*3/uL (ref 150–400)
RBC: 5.1 MIL/uL (ref 3.87–5.11)
RDW: 13.2 % (ref 11.5–15.5)
WBC: 9.5 10*3/uL (ref 4.0–10.5)

## 2017-11-28 LAB — COMPREHENSIVE METABOLIC PANEL
ALK PHOS: 77 U/L (ref 38–126)
ALT: 8 U/L — AB (ref 14–54)
AST: 19 U/L (ref 15–41)
Albumin: 4.4 g/dL (ref 3.5–5.0)
Anion gap: 10 (ref 5–15)
BILIRUBIN TOTAL: 0.6 mg/dL (ref 0.3–1.2)
BUN: 9 mg/dL (ref 6–20)
CO2: 22 mmol/L (ref 22–32)
CREATININE: 0.69 mg/dL (ref 0.44–1.00)
Calcium: 8.7 mg/dL — ABNORMAL LOW (ref 8.9–10.3)
Chloride: 103 mmol/L (ref 101–111)
GFR calc Af Amer: >60 mL/min (ref >60)
GFR calc non Af Amer: >60 mL/min (ref >60)
GLUCOSE: 98 mg/dL (ref 65–99)
Potassium: 3.1 mmol/L — ABNORMAL LOW (ref 3.5–5.1)
Sodium: 135 mmol/L (ref 135–145)
TOTAL PROTEIN: 7.2 g/dL (ref 6.5–8.1)

## 2017-11-28 LAB — RAPID URINE DRUG SCREEN, HOSP PERFORMED
Amphetamines: POSITIVE — AB
BARBITURATES: NOT DETECTED
BENZODIAZEPINES: NOT DETECTED
Cocaine: NOT DETECTED
Opiates: POSITIVE — AB
Tetrahydrocannabinol: NOT DETECTED

## 2017-11-28 LAB — ETHANOL: Alcohol, Ethyl (B): <10 mg/dL (ref <10)

## 2017-11-28 LAB — I-STAT BETA HCG BLOOD, ED (MC, WL, AP ONLY): I-stat hCG, quantitative: <5 m[IU]/mL (ref <5)
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
## 2017-11-28 NOTE — Discharge Instructions (Addendum)
For your behavioral health needs, you are advised to keep your previously scheduled appointments with [facility]:       [facility]      [address]      [place], [place] [address]      [phone]
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
## 2017-11-28 NOTE — BH Assessment (Signed)
[place] Assessment Progress Note  Per Waylan Boga, DNP, this pt does not require psychiatric hospitalization at this time.  Pt is to be discharged from [facility] with recommendation to keep previously scheduled appointments at [facility].  This has been included in pt's discharge instructions.  Pt's nurse, Caren Griffins, has been notified.  Jalene Mullet, [place] Triage Specialist [phone]
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
## 2018-05-07 ENCOUNTER — Encounter (HOSPITAL_COMMUNITY): Payer: Self-pay | Admitting: Emergency Medicine

## 2018-05-07 DIAGNOSIS — J45909 Unspecified asthma, uncomplicated: Secondary | ICD-10-CM | POA: Diagnosis not present

## 2018-05-07 DIAGNOSIS — Y999 Unspecified external cause status: Secondary | ICD-10-CM | POA: Diagnosis not present

## 2018-05-07 DIAGNOSIS — Y9389 Activity, other specified: Secondary | ICD-10-CM | POA: Insufficient documentation

## 2018-05-07 DIAGNOSIS — C9591 Leukemia, unspecified, in remission: Secondary | ICD-10-CM | POA: Insufficient documentation

## 2018-05-07 DIAGNOSIS — Z23 Encounter for immunization: Secondary | ICD-10-CM | POA: Insufficient documentation

## 2018-05-07 DIAGNOSIS — F17200 Nicotine dependence, unspecified, uncomplicated: Secondary | ICD-10-CM | POA: Insufficient documentation

## 2018-05-07 DIAGNOSIS — S41131A Puncture wound without foreign body of right upper arm, initial encounter: Secondary | ICD-10-CM | POA: Insufficient documentation

## 2018-05-07 DIAGNOSIS — W540XXA Bitten by dog, initial encounter: Secondary | ICD-10-CM | POA: Diagnosis not present

## 2018-05-07 DIAGNOSIS — Z203 Contact with and (suspected) exposure to rabies: Secondary | ICD-10-CM | POA: Insufficient documentation

## 2018-05-07 DIAGNOSIS — S4991XA Unspecified injury of right shoulder and upper arm, initial encounter: Secondary | ICD-10-CM | POA: Diagnosis present

## 2018-05-07 DIAGNOSIS — I10 Essential (primary) hypertension: Secondary | ICD-10-CM | POA: Diagnosis not present

## 2018-05-07 DIAGNOSIS — Z79899 Other long term (current) drug therapy: Secondary | ICD-10-CM | POA: Insufficient documentation

## 2018-05-07 DIAGNOSIS — Y92199 Unspecified place in other specified residential institution as the place of occurrence of the external cause: Secondary | ICD-10-CM | POA: Diagnosis not present

## 2018-05-07 DIAGNOSIS — Z2914 Encounter for prophylactic rabies immune globin: Secondary | ICD-10-CM | POA: Insufficient documentation

## 2018-05-07 NOTE — ED Triage Notes (Signed)
Patient reports she was bit by a neighbors pit bull this evening. Puncture wound to right arm. Patient unsure of dogs shot record. Reports taking prescribed hydrocodone PTA. Reports last tetanus shot in 2003.

## 2018-05-08 ENCOUNTER — Other Ambulatory Visit: Payer: Self-pay

## 2018-05-08 ENCOUNTER — Emergency Department (HOSPITAL_COMMUNITY)
Admission: EM | Admit: 2018-05-08 | Discharge: 2018-05-08 | Disposition: A | Discharge status: Home / Self Care | Payer: Medicaid Other | Attending: Emergency Medicine | Admitting: Emergency Medicine

## 2018-05-08 DIAGNOSIS — W540XXA Bitten by dog, initial encounter: Secondary | ICD-10-CM

## 2018-05-08 MED ORDER — RABIES IMMUNE GLOBULIN 150 UNIT/ML IM INJ
20.0000 [IU]/kg | INJECTION | Freq: Once | INTRAMUSCULAR | Status: AC
  Administered 2018-05-08: 1350 [IU] via INTRAMUSCULAR
  Filled 2018-05-08: qty 9

## 2018-05-08 MED ORDER — AMOXICILLIN-POT CLAVULANATE 875-125 MG PO TABS: 1.0000 | ORAL_TABLET | Freq: Two times a day (BID) | ORAL | 0 refills | Status: DC

## 2018-05-08 MED ORDER — RABIES VACCINE, PCEC IM SUSR
1.0000 mL | Freq: Once | INTRAMUSCULAR | Status: AC
  Administered 2018-05-08: 1 mL via INTRAMUSCULAR
  Filled 2018-05-08: qty 1

## 2018-05-08 MED ORDER — AMOXICILLIN-POT CLAVULANATE 875-125 MG PO TABS
1.0000 | ORAL_TABLET | Freq: Once | ORAL | Status: AC
  Administered 2018-05-08: 1 via ORAL
  Filled 2018-05-08: qty 1

## 2018-05-08 MED ORDER — TETANUS-DIPHTH-ACELL PERTUSSIS 5-2.5-18.5 LF-MCG/0.5 IM SUSP
0.5000 mL | Freq: Once | INTRAMUSCULAR | Status: AC
Start: 2018-05-08 — End: 2018-05-08
  Administered 2018-05-08: 0.5 mL via INTRAMUSCULAR
  Filled 2018-05-08: qty 0.5
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
## 2018-05-08 NOTE — Discharge Instructions (Addendum)
Take the prescribed medication as directed.  Keep wounds clean and dry. If you cannot confirm the dog has been vaccinated, you will need to continue with the rabies series.  Vaccinations may not be fully effective unless entire series is completed. Subsequent doses would be due on 05/11/18, 05/15/18 and 05/29/18.  You can get these at the [facility] urgent [facility]. Monitor wounds for any signs of infection. Follow-up with your primary care doctor. Return to the ED for new or worsening symptoms.

## 2018-05-11 ENCOUNTER — Ambulatory Visit (HOSPITAL_COMMUNITY)
Admission: EM | Admit: 2018-05-11 | Discharge: 2018-05-11 | Disposition: A | Discharge status: Home / Self Care | Payer: Medicaid Other | Attending: Internal Medicine | Admitting: Internal Medicine

## 2018-05-11 ENCOUNTER — Encounter (HOSPITAL_COMMUNITY): Payer: Self-pay | Admitting: Emergency Medicine

## 2018-05-11 DIAGNOSIS — Z23 Encounter for immunization: Secondary | ICD-10-CM | POA: Diagnosis not present

## 2018-05-11 DIAGNOSIS — Z203 Contact with and (suspected) exposure to rabies: Secondary | ICD-10-CM | POA: Diagnosis not present

## 2018-05-11 MED ORDER — RABIES VACCINE, PCEC IM SUSR
INTRAMUSCULAR | Status: AC
  Filled 2018-05-11: qty 1

## 2018-05-11 MED ORDER — RABIES VACCINE, PCEC IM SUSR
1.0000 mL | Freq: Once | INTRAMUSCULAR | Status: AC
Start: 2018-05-11 — End: 2018-05-11
  Administered 2018-05-11: 1 mL via INTRAMUSCULAR

## 2018-05-11 NOTE — ED Triage Notes (Signed)
Pt here for rabies day 3 given to left arm

## 2018-05-14 ENCOUNTER — Ambulatory Visit: Admit: 2018-05-14 | Discharge: 2018-05-15 | Payer: MEDICAID | Attending: Rheumatology | Primary: Rheumatology

## 2018-05-14 DIAGNOSIS — M359 Systemic involvement of connective tissue, unspecified: Secondary | ICD-10-CM | POA: Insufficient documentation

## 2018-05-14 DIAGNOSIS — L732 Hidradenitis suppurativa: Secondary | ICD-10-CM

## 2018-05-14 DIAGNOSIS — M797 Fibromyalgia: Secondary | ICD-10-CM

## 2018-05-15 ENCOUNTER — Ambulatory Visit (HOSPITAL_COMMUNITY)
Admission: EM | Admit: 2018-05-15 | Discharge: 2018-05-15 | Disposition: A | Discharge status: Home / Self Care | Payer: Medicaid Other | Attending: Family Medicine | Admitting: Family Medicine

## 2018-05-15 DIAGNOSIS — Z23 Encounter for immunization: Secondary | ICD-10-CM

## 2018-05-15 DIAGNOSIS — Z203 Contact with and (suspected) exposure to rabies: Secondary | ICD-10-CM

## 2018-05-15 MED ORDER — RABIES VACCINE, PCEC IM SUSR
INTRAMUSCULAR | Status: AC
  Filled 2018-05-15: qty 1

## 2018-05-15 MED ORDER — RABIES VACCINE, PCEC IM SUSR
1.0000 mL | Freq: Once | INTRAMUSCULAR | Status: AC
  Administered 2018-05-15: 1 mL via INTRAMUSCULAR

## 2018-05-15 NOTE — ED Triage Notes (Signed)
Pt here for rabies injection. 

## 2018-05-29 ENCOUNTER — Encounter (HOSPITAL_COMMUNITY): Payer: Self-pay | Admitting: Family Medicine

## 2018-05-29 ENCOUNTER — Ambulatory Visit (HOSPITAL_COMMUNITY)
Admission: EM | Admit: 2018-05-29 | Discharge: 2018-05-29 | Disposition: A | Discharge status: Home / Self Care | Payer: Medicaid Other | Attending: Physician Assistant | Admitting: Physician Assistant

## 2018-05-29 DIAGNOSIS — Z23 Encounter for immunization: Secondary | ICD-10-CM | POA: Diagnosis not present

## 2018-05-29 DIAGNOSIS — Z203 Contact with and (suspected) exposure to rabies: Secondary | ICD-10-CM | POA: Insufficient documentation

## 2018-05-29 DIAGNOSIS — M79601 Pain in right arm: Secondary | ICD-10-CM | POA: Insufficient documentation

## 2018-05-29 MED ORDER — RABIES VACCINE, PCEC IM SUSR
INTRAMUSCULAR | Status: AC
  Filled 2018-05-29: qty 1

## 2018-05-29 MED ORDER — RABIES VACCINE, PCEC IM SUSR
1.0000 mL | Freq: Once | INTRAMUSCULAR | Status: AC
  Administered 2018-05-29: 1 mL via INTRAMUSCULAR

## 2018-05-29 NOTE — Discharge Instructions (Signed)
Continue supportive measures at home.  You arm exam in completely normal today.

## 2018-05-29 NOTE — ED Triage Notes (Signed)
Pt here for rabies vaccination and right arm pain where the dog bit her. She reports this started 48 hours ago. She is taking her Lupus meds and still having pain that is keeping her up at night.

## 2018-05-29 NOTE — ED Provider Notes (Signed)
05/29/2018 6:44 PM   DOB: 01/04/1981 / MRN: [id number]  SUBJECTIVE:  [name] is a 37 y.o. female presenting for right arm pain.  Pain started after a dog bite about 20 or so days ago.  Patient has been treated with antibiotics, tetanus shot and has received multiple rabies injections.  She continues to have pain about the arm.  She tells me that her physician sent her here for an x-ray.  She is allergic to bee venom; shrimp [shellfish allergy]; sulfonamide derivatives; aleve [naproxen]; dilaudid [hydromorphone hcl]; morphine and related; and tramadol.   She  has a past medical history of Abdominal wall mass, ADHD (attention deficit hyperactivity disorder), Anemia, Arthritis, Asthma, Back pain, Chest pain, Depression, Diarrhea, Endometriosis, Headache(784.0), Hydradenitis, Hydradenitis (07/13/2011), Hypertension, Leukemia ([place]) (at age 60), Migraines, MRSA (methicillin resistant staph aureus) culture positive (2010), Nausea, S/P total abdominal hysterectomy (06/26/2011), Scoliosis, and Thyroid disease.    She  reports that she has been smoking cigarettes.  She has a 11.00 pack-year smoking history. She has never used smokeless tobacco. She reports that she does not drink alcohol or use drugs. She  reports that she currently engages in sexual activity. She reports using the following method of birth control/protection: Surgical. The patient  has a past surgical history that includes Laparoscopic endometriosis fulguration (06/1999); Axillary hidradenitis excision; Cesarean section (08/31/2006, 08/26/2007); Abdominal hysterectomy (06/30/2008); and Mass excision (06/19/2012).  Her family history includes Bipolar disorder in her brother and sister; Cancer in her brother, maternal grandmother, maternal uncle, and paternal aunt; Diabetes in her father and mother; Heart disease in her father and mother; Hypertension in her father and mother; Pancreatic cancer in her maternal grandmother.  Review of Systems   Constitutional: Negative for chills, diaphoresis and fever.  Gastrointestinal: Negative for nausea.  Musculoskeletal: Positive for joint pain and myalgias. Negative for back pain, falls and neck pain.  Skin: Negative for rash.  Neurological: Negative for dizziness.    OBJECTIVE:  BP 109/83   Pulse 88   Temp 98 F (36.7 C)   Resp 18   SpO2 99%   Wt Readings from Last 3 Encounters:  05/08/18 147 lb (66.7 kg)  11/01/16 157 lb (71.2 kg)  11/08/15 141 lb (64 kg)   Temp Readings from Last 3 Encounters:  05/29/18 98 F (36.7 C)  05/08/18 97.8 F (36.6 C) (Oral)  11/28/17 98.9 F (37.2 C) (Oral)   BP Readings from Last 3 Encounters:  05/29/18 109/83  05/08/18 (!) 124/98  11/28/17 112/72   Pulse Readings from Last 3 Encounters:  05/29/18 88  05/08/18 80  11/28/17 92    Physical Exam  Constitutional: She is oriented to person, place, and time. She appears well-nourished. No distress.  Eyes: Pupils are equal, round, and reactive to light. EOM are normal.  Cardiovascular: Normal rate.  Pulmonary/Chest: Effort normal.  Abdominal: She exhibits no distension.  Lymphadenopathy:  Negative for olecranon lymphadenopathy and right axillary lymphadenopathy.  Patient negative for rash and swelling about the arm.  There is no significant tenderness.  Range of motion shoulder full.  Range of motion of the elbow full pronation and supination is intact.  Strength 5 out of 5 in grip and wrist flexion and wrist extension.  Pulses 2+.  The extremity is warm and cap refill is brisk.  Sensation to light touch intact about the ulnar distribution.  Neurological: She is alert and oriented to person, place, and time. No cranial nerve deficit. Gait normal.  Skin: Skin is dry.  She is not diaphoretic.  Psychiatric: She has a normal mood and affect.  Vitals reviewed.   Lab Results  Component Value Date   WBC 9.5 11/28/2017   HGB 15.0 11/28/2017   HCT 44.2 11/28/2017   MCV 86.7 11/28/2017   PLT  243 11/28/2017    Lab Results  Component Value Date   CREATININE 0.69 11/28/2017   BUN 9 11/28/2017   NA 135 11/28/2017   K 3.1 (L) 11/28/2017   CL 103 11/28/2017   CO2 22 11/28/2017    Lab Results  Component Value Date   ALT 8 (L) 11/28/2017   AST 19 11/28/2017   ALKPHOS 77 11/28/2017   BILITOT 0.6 11/28/2017    Lab Results  Component Value Date   TSH 1.906 09/01/2010     No results found for this or any previous visit (from the past 21 hour(s)).  No results found.  ASSESSMENT AND PLAN:   Right arm pain: I have advised that she continue her pain plan at home.  She has an allergy to NSAIDs.  She has no abnormality on her exam here.  Need for post exposure prophylaxis for rabies - Plan: rabies vaccine (RABAVERT) injection 1 mL    Discharge Instructions     Continue supportive measures at home.  You arm exam in completely normal today.        The patient is advised to call or return to clinic if she does not see an improvement in symptoms, or to seek the care of the closest emergency department if she worsens with the above plan.   Philis Fendt, MHS, PA-C 05/29/2018 6:44 PM  Tereasa Coop, PA-C 05/29/18 1844

## 2018-08-05 ENCOUNTER — Ambulatory Visit: Payer: Medicaid Other | Attending: Orthopaedic Surgery | Admitting: Physical Therapy

## 2018-08-05 ENCOUNTER — Encounter: Payer: Self-pay | Admitting: Physical Therapy

## 2018-08-05 ENCOUNTER — Other Ambulatory Visit: Payer: Self-pay

## 2018-08-05 DIAGNOSIS — R208 Other disturbances of skin sensation: Secondary | ICD-10-CM | POA: Diagnosis present

## 2018-08-05 DIAGNOSIS — M79601 Pain in right arm: Secondary | ICD-10-CM | POA: Diagnosis present

## 2018-08-05 DIAGNOSIS — M5412 Radiculopathy, cervical region: Secondary | ICD-10-CM | POA: Diagnosis present

## 2018-08-05 DIAGNOSIS — M6281 Muscle weakness (generalized): Secondary | ICD-10-CM

## 2018-08-05 NOTE — Patient Instructions (Signed)
Head Press With [place] chin SLIGHTLY toward chest, keep mouth closed. Feel weight on back of head. Increase weight by pressing head down. Hold ___ seconds. Relax. Repeat ___ times. Surface: floor   Copyright  VHI. All rights reserved.  Scapular Retraction (Standing)    With arms at sides, pinch shoulder blades together. Repeat ____ times per set. Do ____ sets per session. Do ____ sessions per day.  http://orth.exer.us/945   Copyright  VHI. All rights reserved.    Posture Tips DO: - stand tall and erect - keep chin tucked in - keep head and shoulders in alignment - check posture regularly in mirror or large window - pull head back against headrest in car seat;  Change your position often.  Sit with lumbar support. DON'T: - slouch or slump while watching TV or reading - sit, stand or lie in one position  for too long;  Sitting is especially hard on the spine so if you sit at a desk/use the computer, then stand up often!   Copyright  VHI. All rights reserved.  Posture - Standing   Good posture is important. Avoid slouching and forward head thrust. Maintain curve in low back and align ears over shoul- ders, hips over ankles.  Pull your belly button in toward your back bone.   Copyright  VHI. All rights reserved.  Posture - Sitting   Sit upright, head facing forward. Try using a roll to support lower back. Keep shoulders relaxed, and avoid rounded back. Keep hips level with knees. Avoid crossing legs for long periods.   Copyright  VHI. All rights reserved.    Flexibility: Upper Trapezius Stretch   Gently grasp right side of head while reaching behind back with other hand. Tilt head away until a gentle stretch is felt. Hold __30__ seconds. Repeat __3__ times per set. Do __1__ sets per session. Do _2___ sessions per day.  http://orth.exer.us/340    Flexibility: Neck Retraction   Pull head straight back, keeping eyes and jaw level. Repeat __10__ times  per set. Do __1__ sets per session. Do ___2_ sessions per day.
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
## 2018-08-19 ENCOUNTER — Ambulatory Visit: Payer: Medicaid Other | Admitting: Physical Therapy

## 2018-08-19 ENCOUNTER — Encounter: Payer: Self-pay | Admitting: Physical Therapy

## 2018-08-19 DIAGNOSIS — M79601 Pain in right arm: Secondary | ICD-10-CM

## 2018-08-19 DIAGNOSIS — M5412 Radiculopathy, cervical region: Secondary | ICD-10-CM | POA: Diagnosis not present

## 2018-08-19 DIAGNOSIS — R208 Other disturbances of skin sensation: Secondary | ICD-10-CM

## 2018-08-19 DIAGNOSIS — M6281 Muscle weakness (generalized): Secondary | ICD-10-CM
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
## 2018-08-26 ENCOUNTER — Ambulatory Visit: Admit: 2018-08-26 | Discharge: 2018-08-27 | Payer: MEDICAID

## 2018-08-26 DIAGNOSIS — M797 Fibromyalgia: Secondary | ICD-10-CM

## 2018-08-26 DIAGNOSIS — M359 Systemic involvement of connective tissue, unspecified: Principal | ICD-10-CM

## 2018-08-26 MED ORDER — HYDROXYCHLOROQUINE 200 MG TABLET
ORAL_TABLET | Freq: Two times a day (BID) | ORAL | 11 refills | 0.00000 days | Status: CP
Start: 2018-08-26 — End: 2019-06-15

## 2018-09-03 ENCOUNTER — Ambulatory Visit: Payer: Medicaid Other | Attending: Orthopaedic Surgery | Admitting: Physical Therapy

## 2018-09-03 DIAGNOSIS — M79601 Pain in right arm: Secondary | ICD-10-CM | POA: Diagnosis present

## 2018-09-03 DIAGNOSIS — M6281 Muscle weakness (generalized): Secondary | ICD-10-CM

## 2018-09-03 DIAGNOSIS — M5412 Radiculopathy, cervical region: Secondary | ICD-10-CM | POA: Diagnosis not present

## 2018-09-03 DIAGNOSIS — R208 Other disturbances of skin sensation: Secondary | ICD-10-CM

## 2018-09-03 NOTE — Patient Instructions (Signed)
Knee to Chest    Lying supine, bend involved knee to chest __5_ times. Repeat with other leg. Do __2_ times per day.  Copyright  VHI. All rights reserved.  Lower Trunk Rotation Stretch    Keeping back flat and feet together, rotate knees to left side. Hold 15____ seconds. Repeat ___10_ times per set. Do __1__ sets per session. Do _2___ sessions per day.  http://orth.exer.us/123   Copyright  VHI. All rights reserved.

## 2018-09-04 ENCOUNTER — Encounter: Payer: Self-pay | Admitting: Physical Therapy
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
## 2018-09-05 ENCOUNTER — Encounter

## 2018-09-10 ENCOUNTER — Encounter: Payer: Self-pay | Admitting: Physical Therapy

## 2018-09-22 MED ORDER — PREDNISONE 5 MG TABLET
ORAL_TABLET | 0 refills | 0 days | Status: CP
Start: 2018-09-22 — End: 2019-01-14

## 2019-01-08 ENCOUNTER — Encounter (HOSPITAL_COMMUNITY): Payer: Self-pay

## 2019-01-08 ENCOUNTER — Other Ambulatory Visit: Payer: Self-pay

## 2019-01-08 ENCOUNTER — Emergency Department (HOSPITAL_COMMUNITY)
Admission: EM | Admit: 2019-01-08 | Discharge: 2019-01-09 | Disposition: A | Discharge status: Home / Self Care | Payer: Medicaid Other | Attending: Emergency Medicine | Admitting: Emergency Medicine

## 2019-01-08 DIAGNOSIS — I10 Essential (primary) hypertension: Secondary | ICD-10-CM | POA: Diagnosis not present

## 2019-01-08 DIAGNOSIS — R52 Pain, unspecified: Secondary | ICD-10-CM | POA: Diagnosis present

## 2019-01-08 DIAGNOSIS — F909 Attention-deficit hyperactivity disorder, unspecified type: Secondary | ICD-10-CM | POA: Diagnosis not present

## 2019-01-08 DIAGNOSIS — F17210 Nicotine dependence, cigarettes, uncomplicated: Secondary | ICD-10-CM | POA: Diagnosis not present

## 2019-01-08 DIAGNOSIS — R5381 Other malaise: Secondary | ICD-10-CM

## 2019-01-08 DIAGNOSIS — Z79899 Other long term (current) drug therapy: Secondary | ICD-10-CM | POA: Diagnosis not present

## 2019-01-08 HISTORY — DX: Reserved for concepts with insufficient information to code with codable children: IMO0002

## 2019-01-08 HISTORY — DX: Systemic lupus erythematosus, unspecified: M32.9

## 2019-01-08 LAB — URINALYSIS, ROUTINE W REFLEX MICROSCOPIC
BILIRUBIN URINE: NEGATIVE
Glucose, UA: NEGATIVE mg/dL
HGB URINE DIPSTICK: NEGATIVE
KETONES UR: NEGATIVE mg/dL
Leukocytes, UA: NEGATIVE
Nitrite: NEGATIVE
Protein, ur: NEGATIVE mg/dL
SPECIFIC GRAVITY, URINE: 1.016 (ref 1.005–1.030)
pH: 6 (ref 5.0–8.0)

## 2019-01-08 LAB — CBC
HCT: 43.5 % (ref 36.0–46.0)
HEMOGLOBIN: 13.4 g/dL (ref 12.0–15.0)
MCH: 28.9 pg (ref 26.0–34.0)
MCHC: 30.8 g/dL (ref 30.0–36.0)
MCV: 94 fL (ref 80.0–100.0)
Platelets: 203 10*3/uL (ref 150–400)
RBC: 4.63 MIL/uL (ref 3.87–5.11)
RDW: 13 % (ref 11.5–15.5)
WBC: 7.2 10*3/uL (ref 4.0–10.5)
nRBC: 0 % (ref 0.0–0.2)

## 2019-01-08 LAB — COMPREHENSIVE METABOLIC PANEL
ALBUMIN: 3.8 g/dL (ref 3.5–5.0)
ALK PHOS: 59 U/L (ref 38–126)
ALT: 17 U/L (ref 0–44)
AST: 21 U/L (ref 15–41)
Anion gap: 6 (ref 5–15)
BILIRUBIN TOTAL: 0.4 mg/dL (ref 0.3–1.2)
BUN: 9 mg/dL (ref 6–20)
CALCIUM: 8.9 mg/dL (ref 8.9–10.3)
CO2: 24 mmol/L (ref 22–32)
CREATININE: 0.69 mg/dL (ref 0.44–1.00)
Chloride: 109 mmol/L (ref 98–111)
GFR calc Af Amer: >60 mL/min (ref >60)
Glucose, Bld: 96 mg/dL (ref 70–99)
POTASSIUM: 4.1 mmol/L (ref 3.5–5.1)
Sodium: 139 mmol/L (ref 135–145)
Total Protein: 6.5 g/dL (ref 6.5–8.1)

## 2019-01-08 LAB — C-REACTIVE PROTEIN: CRP: 0.7 mg/dL (ref <1.0)

## 2019-01-08 LAB — LACTIC ACID, PLASMA: Lactic Acid, Venous: 2.4 mmol/L (ref 0.5–1.9)

## 2019-01-08 MED ORDER — METHYLPREDNISOLONE SODIUM SUCC 125 MG IJ SOLR
125.0000 mg | Freq: Once | INTRAMUSCULAR | Status: AC
  Administered 2019-01-08: 125 mg via INTRAVENOUS
  Filled 2019-01-08: qty 2

## 2019-01-08 NOTE — ED Triage Notes (Signed)
Patient states she called her rheumatologist today and reported that she had abdominal swelling, feet swelling, hand swelling x 3 days. Patient was instructed to come to the Ed for follow up.
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
## 2019-01-08 NOTE — ED Notes (Signed)
Naomie Dean, RN and Nehemiah Settle, PA-C informed of lactic acid of 2.4.

## 2019-01-09 LAB — SEDIMENTATION RATE: SED RATE: 1 mm/h (ref 0–22)

## 2019-01-09 LAB — LACTIC ACID, PLASMA: Lactic Acid, Venous: 2.1 mmol/L (ref 0.5–1.9)

## 2019-01-09 MED ORDER — SODIUM CHLORIDE 0.9 % IV BOLUS
1000.0000 mL | Freq: Once | INTRAVENOUS | Status: AC
  Administered 2019-01-09: 1000 mL via INTRAVENOUS

## 2019-01-09 NOTE — Discharge Instructions (Addendum)
You can be discharged home tonight and are strongly encouraged to contact your rheumatologist tomorrow for further management of current symptoms. They will need to direct whether further treatment with steroids is felt beneficial. Labs are included here for your review with your doctor.   Results for orders placed or performed during the hospital encounter of 01/08/19  Comprehensive metabolic panel  Result Value Ref Range   Sodium 139 135 - 145 mmol/L   Potassium 4.1 3.5 - 5.1 mmol/L   Chloride 109 98 - 111 mmol/L   CO2 24 22 - 32 mmol/L   Glucose, Bld 96 70 - 99 mg/dL   BUN 9 6 - 20 mg/dL   Creatinine, Ser 0.69 0.44 - 1.00 mg/dL   Calcium 8.9 8.9 - 10.3 mg/dL   Total Protein 6.5 6.5 - 8.1 g/dL   Albumin 3.8 3.5 - 5.0 g/dL   AST 21 15 - 41 U/L   ALT 17 0 - 44 U/L   Alkaline Phosphatase 59 38 - 126 U/L   Total Bilirubin 0.4 0.3 - 1.2 mg/dL   GFR calc non Af Amer >60 >60 mL/min   GFR calc Af Amer >60 >60 mL/min   Anion gap 6 5 - 15  CBC  Result Value Ref Range   WBC 7.2 4.0 - 10.5 K/uL   RBC 4.63 3.87 - 5.11 MIL/uL   Hemoglobin 13.4 12.0 - 15.0 g/dL   HCT 43.5 36.0 - 46.0 %   MCV 94.0 80.0 - 100.0 fL   MCH 28.9 26.0 - 34.0 pg   MCHC 30.8 30.0 - 36.0 g/dL   RDW 13.0 11.5 - 15.5 %   Platelets 203 150 - 400 K/uL   nRBC 0.0 0.0 - 0.2 %  Urinalysis, Routine w reflex microscopic  Result Value Ref Range   Color, Urine YELLOW YELLOW   APPearance CLEAR CLEAR   Specific Gravity, Urine 1.016 1.005 - 1.030   pH 6.0 5.0 - 8.0   Glucose, UA NEGATIVE NEGATIVE mg/dL   Hgb urine dipstick NEGATIVE NEGATIVE   Bilirubin Urine NEGATIVE NEGATIVE   Ketones, ur NEGATIVE NEGATIVE mg/dL   Protein, ur NEGATIVE NEGATIVE mg/dL   Nitrite NEGATIVE NEGATIVE   Leukocytes, UA NEGATIVE NEGATIVE  Sedimentation rate  Result Value Ref Range   Sed Rate 1 0 - 22 mm/hr  C-reactive protein  Result Value Ref Range   CRP 0.7 <1.0 mg/dL  Lactic acid, plasma  Result Value Ref Range   Lactic Acid, Venous 2.4  (HH) 0.5 - 1.9 mmol/L  Lactic acid, plasma  Result Value Ref Range   Lactic Acid, Venous 2.1 (HH) 0.5 - 1.9 mmol/L

## 2019-01-14 ENCOUNTER — Ambulatory Visit: Admit: 2019-01-14 | Discharge: 2019-01-15 | Payer: MEDICAID

## 2019-01-14 DIAGNOSIS — R911 Solitary pulmonary nodule: Secondary | ICD-10-CM

## 2019-01-14 DIAGNOSIS — L732 Hidradenitis suppurativa: Secondary | ICD-10-CM

## 2019-01-14 DIAGNOSIS — M359 Systemic involvement of connective tissue, unspecified: Principal | ICD-10-CM

## 2019-01-14 DIAGNOSIS — R200 Anesthesia of skin: Secondary | ICD-10-CM

## 2019-01-14 DIAGNOSIS — M3500 Sicca syndrome, unspecified: Secondary | ICD-10-CM

## 2019-01-14 DIAGNOSIS — M797 Fibromyalgia: Secondary | ICD-10-CM

## 2019-01-14 MED ORDER — PREDNISONE 5 MG TABLET
ORAL_TABLET | 0 refills | 0 days | Status: CP
Start: 2019-01-14 — End: 2019-07-07

## 2019-01-14 MED ORDER — FOLIC ACID 1 MG TABLET
ORAL_TABLET | Freq: Every day | ORAL | 3 refills | 0.00000 days | Status: CP
Start: 2019-01-14 — End: ?

## 2019-01-14 MED ORDER — METHOTREXATE SODIUM 2.5 MG TABLET
ORAL_TABLET | ORAL | 3 refills | 0 days | Status: CP
Start: 2019-01-14 — End: 2019-07-07

## 2019-01-22 ENCOUNTER — Encounter: Payer: Self-pay | Admitting: Physical Therapy

## 2019-01-22 ENCOUNTER — Other Ambulatory Visit: Payer: Self-pay

## 2019-01-22 ENCOUNTER — Ambulatory Visit: Payer: Medicaid Other | Attending: Physical Medicine and Rehabilitation | Admitting: Physical Therapy

## 2019-01-22 DIAGNOSIS — M5412 Radiculopathy, cervical region: Secondary | ICD-10-CM | POA: Insufficient documentation

## 2019-01-22 DIAGNOSIS — M79601 Pain in right arm: Secondary | ICD-10-CM | POA: Insufficient documentation

## 2019-01-22 DIAGNOSIS — M6281 Muscle weakness (generalized): Secondary | ICD-10-CM | POA: Insufficient documentation

## 2019-01-22 DIAGNOSIS — G8929 Other chronic pain: Secondary | ICD-10-CM | POA: Insufficient documentation

## 2019-01-22 DIAGNOSIS — R208 Other disturbances of skin sensation: Secondary | ICD-10-CM | POA: Insufficient documentation

## 2019-01-22 DIAGNOSIS — M797 Fibromyalgia: Secondary | ICD-10-CM | POA: Diagnosis present

## 2019-01-22 DIAGNOSIS — M5441 Lumbago with sciatica, right side: Secondary | ICD-10-CM | POA: Diagnosis present

## 2019-01-22 DIAGNOSIS — R209 Unspecified disturbances of skin sensation: Secondary | ICD-10-CM | POA: Insufficient documentation
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
## 2019-01-23 ENCOUNTER — Ambulatory Visit: Admit: 2019-01-23 | Discharge: 2019-01-24 | Payer: MEDICAID

## 2019-01-23 DIAGNOSIS — M3500 Sicca syndrome, unspecified: Principal | ICD-10-CM

## 2019-01-23 DIAGNOSIS — R911 Solitary pulmonary nodule: Secondary | ICD-10-CM

## 2019-01-29 ENCOUNTER — Ambulatory Visit: Payer: Medicaid Other | Admitting: Physical Therapy

## 2019-01-29 ENCOUNTER — Encounter: Payer: Self-pay | Admitting: Physical Therapy

## 2019-01-29 DIAGNOSIS — R208 Other disturbances of skin sensation: Secondary | ICD-10-CM

## 2019-01-29 DIAGNOSIS — M5441 Lumbago with sciatica, right side: Principal | ICD-10-CM

## 2019-01-29 DIAGNOSIS — M5412 Radiculopathy, cervical region: Secondary | ICD-10-CM

## 2019-01-29 DIAGNOSIS — G8929 Other chronic pain: Secondary | ICD-10-CM

## 2019-01-29 DIAGNOSIS — R209 Unspecified disturbances of skin sensation: Secondary | ICD-10-CM

## 2019-01-29 DIAGNOSIS — M797 Fibromyalgia: Secondary | ICD-10-CM

## 2019-01-29 DIAGNOSIS — M6281 Muscle weakness (generalized): Secondary | ICD-10-CM

## 2019-01-29 DIAGNOSIS — M79601 Pain in right arm: Secondary | ICD-10-CM
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
## 2019-02-03 ENCOUNTER — Ambulatory Visit: Payer: Medicaid Other | Attending: Physical Medicine and Rehabilitation | Admitting: Physical Therapy

## 2019-02-03 ENCOUNTER — Encounter: Payer: Self-pay | Admitting: Physical Therapy

## 2019-02-03 DIAGNOSIS — G8929 Other chronic pain: Secondary | ICD-10-CM

## 2019-02-03 DIAGNOSIS — M5441 Lumbago with sciatica, right side: Secondary | ICD-10-CM | POA: Insufficient documentation

## 2019-02-03 DIAGNOSIS — R209 Unspecified disturbances of skin sensation: Secondary | ICD-10-CM | POA: Diagnosis present

## 2019-02-03 DIAGNOSIS — M6281 Muscle weakness (generalized): Secondary | ICD-10-CM | POA: Insufficient documentation

## 2019-02-03 DIAGNOSIS — M5412 Radiculopathy, cervical region: Secondary | ICD-10-CM | POA: Diagnosis present

## 2019-02-03 DIAGNOSIS — M797 Fibromyalgia: Secondary | ICD-10-CM | POA: Diagnosis present

## 2019-02-03 NOTE — Patient Instructions (Signed)
Step 1  Step 2  Cat-Camel reps: 10  sets: 1  hold: 10  daily: 1  weekly: 7 Setup  Begin on all fours with your arms directly under your shoulders and knees bent 90 degrees. Movement  Slowly round your back up toward the ceiling, then let it sag down to the floor while looking up, and repeat.  Tip  Make sure to use your entire back for the motion and keep your movements slow and controlled. Step 1  Step 2  Child's Pose Stretch reps: 3  sets: 1  hold: 30  daily: 1  weekly: 7 Setup  Begin on all fours. Movement  Sit your hips back while reaching your arms overhead and lowering your chest to the ground. Hold this position. Tip  Make sure to relax into the pose and try to sit your bottom back to your heels as much as possible. Step 1  Step 2  Step 3  Child's Pose with Sidebending reps: 3  sets: 1  hold: 30  daily: 1  weekly: 7 Setup  Begin on all fours. Movement  Sit your hips back as you reach your hands forward and to the side, then continue to sink further into the stretch. Hold, then repeat to the other side. Tip  Make sure to relax into the pose and try to sit your bottom back to your heels as much as possible. Step 1  Step 2  Supine Lower Trunk Rotation reps: 10  sets: 1  hold: 30  daily: 2  weekly: 7 Setup  Begin lying on your back with your feet flat on the floor and your arms straight out to your sides. Movement  Lower your knees to one side, return to center, and repeat on the other side. Tip  Make sure to activate your core muscles and keep both of your shoulders in contact with the ground throughout the exercise. Step 1  Step 2  Bird Dog reps: 5  sets: 2  hold: 20  daily: 1  weekly: 7 Setup  Begin on all fours, with your arms positioned directly under your shoulders. Movement  Straighten one arm and your opposite leg at the same time, until they are parallel to the floor. Hold briefly, then return to the starting position. Tip  Make sure to keep  your abdominals tight and hips level during the exercise.
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
## 2019-02-10 ENCOUNTER — Encounter: Payer: Self-pay | Admitting: Physical Therapy

## 2019-02-10 ENCOUNTER — Ambulatory Visit: Payer: Medicaid Other | Admitting: Physical Therapy

## 2019-02-10 DIAGNOSIS — M5412 Radiculopathy, cervical region: Secondary | ICD-10-CM

## 2019-02-10 DIAGNOSIS — M5441 Lumbago with sciatica, right side: Principal | ICD-10-CM

## 2019-02-10 DIAGNOSIS — R209 Unspecified disturbances of skin sensation: Secondary | ICD-10-CM

## 2019-02-10 DIAGNOSIS — G8929 Other chronic pain: Secondary | ICD-10-CM

## 2019-02-10 DIAGNOSIS — M797 Fibromyalgia: Secondary | ICD-10-CM

## 2019-02-10 DIAGNOSIS — M6281 Muscle weakness (generalized): Secondary | ICD-10-CM
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
## 2019-02-25 ENCOUNTER — Ambulatory Visit: Payer: Medicaid Other | Admitting: Physical Therapy

## 2019-02-25 ENCOUNTER — Encounter: Payer: Self-pay | Admitting: Physical Therapy

## 2019-02-25 DIAGNOSIS — G8929 Other chronic pain: Secondary | ICD-10-CM

## 2019-02-25 DIAGNOSIS — M5441 Lumbago with sciatica, right side: Principal | ICD-10-CM
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
## 2019-02-27 ENCOUNTER — Encounter: Payer: Self-pay | Admitting: Physical Therapy

## 2019-02-27 ENCOUNTER — Telehealth: Payer: Self-pay | Admitting: Physical Therapy

## 2019-02-27 NOTE — Telephone Encounter (Signed)
Left message regarding no show to appointment this morning. Left next appointment times and asked that she call to reschedule/cancel if needed.

## 2019-03-03 ENCOUNTER — Ambulatory Visit: Payer: Medicaid Other | Admitting: Physical Therapy

## 2019-03-04 ENCOUNTER — Ambulatory Visit: Admit: 2019-03-04 | Discharge: 2019-03-05 | Payer: MEDICAID

## 2019-03-04 DIAGNOSIS — G43909 Migraine, unspecified, not intractable, without status migrainosus: Principal | ICD-10-CM

## 2019-03-04 DIAGNOSIS — M419 Scoliosis, unspecified: Principal | ICD-10-CM

## 2019-03-04 DIAGNOSIS — C9590 Leukemia, unspecified not having achieved remission: Principal | ICD-10-CM

## 2019-03-04 DIAGNOSIS — G8929 Other chronic pain: Principal | ICD-10-CM

## 2019-03-04 DIAGNOSIS — M329 Systemic lupus erythematosus, unspecified: Principal | ICD-10-CM

## 2019-03-04 DIAGNOSIS — J984 Other disorders of lung: Principal | ICD-10-CM

## 2019-03-04 DIAGNOSIS — D649 Anemia, unspecified: Principal | ICD-10-CM

## 2019-03-04 DIAGNOSIS — T7840XA Allergy, unspecified, initial encounter: Principal | ICD-10-CM

## 2019-03-04 DIAGNOSIS — L732 Hidradenitis suppurativa: Principal | ICD-10-CM

## 2019-03-04 DIAGNOSIS — I10 Essential (primary) hypertension: Principal | ICD-10-CM

## 2019-03-04 DIAGNOSIS — M797 Fibromyalgia: Principal | ICD-10-CM

## 2019-03-04 DIAGNOSIS — J45909 Unspecified asthma, uncomplicated: Principal | ICD-10-CM

## 2019-03-04 DIAGNOSIS — F909 Attention-deficit hyperactivity disorder, unspecified type: Principal | ICD-10-CM

## 2019-03-04 DIAGNOSIS — L989 Disorder of the skin and subcutaneous tissue, unspecified: Principal | ICD-10-CM

## 2019-03-04 DIAGNOSIS — M1990 Unspecified osteoarthritis, unspecified site: Principal | ICD-10-CM

## 2019-03-05 ENCOUNTER — Ambulatory Visit: Admit: 2019-03-05 | Discharge: 2019-03-06 | Payer: MEDICAID

## 2019-03-05 DIAGNOSIS — M3500 Sicca syndrome, unspecified: Principal | ICD-10-CM

## 2019-03-05 DIAGNOSIS — L732 Hidradenitis suppurativa: Principal | ICD-10-CM

## 2019-03-05 DIAGNOSIS — M359 Systemic involvement of connective tissue, unspecified: Principal | ICD-10-CM

## 2019-03-05 DIAGNOSIS — D841 Defects in the complement system: Principal | ICD-10-CM

## 2019-03-05 DIAGNOSIS — M797 Fibromyalgia: Principal | ICD-10-CM

## 2019-03-05 DIAGNOSIS — Z79899 Other long term (current) drug therapy: Principal | ICD-10-CM

## 2019-03-05 DIAGNOSIS — Z885 Allergy status to narcotic agent status: Principal | ICD-10-CM

## 2019-03-06 ENCOUNTER — Ambulatory Visit: Payer: Medicaid Other | Attending: Physical Medicine and Rehabilitation | Admitting: Physical Therapy

## 2019-03-06 ENCOUNTER — Encounter: Payer: Self-pay | Admitting: Physical Therapy

## 2019-03-06 DIAGNOSIS — M5412 Radiculopathy, cervical region: Secondary | ICD-10-CM | POA: Diagnosis present

## 2019-03-06 DIAGNOSIS — M797 Fibromyalgia: Secondary | ICD-10-CM | POA: Diagnosis present

## 2019-03-06 DIAGNOSIS — R209 Unspecified disturbances of skin sensation: Secondary | ICD-10-CM | POA: Diagnosis present

## 2019-03-06 DIAGNOSIS — M6281 Muscle weakness (generalized): Secondary | ICD-10-CM | POA: Diagnosis present

## 2019-03-06 DIAGNOSIS — M5441 Lumbago with sciatica, right side: Secondary | ICD-10-CM | POA: Insufficient documentation

## 2019-03-06 DIAGNOSIS — G8929 Other chronic pain: Secondary | ICD-10-CM | POA: Insufficient documentation
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
## 2019-03-06 NOTE — Patient Instructions (Addendum)
   PELVIC TILT  Lie on back, legs bent. Exhale, tilting top of pelvis back, pubic bone up, to flatten lower back. Inhale, rolling pelvis opposite way, top forward, pubic bone down, arch in back. Repeat __10__ times. Do __2__ sessions per day. Copyright  VHI. All rights reserved.    Isometric Hold With Pelvic Floor (Hook-Lying)  Lie with hips and knees bent. Slowly inhale, and then exhale. Pull navel toward spine and tighten pelvic floor. Hold for __10_ seconds. Continue to breathe in and out during hold. Rest for _10__ seconds. Repeat __10_ times. Do __2-3_ times a day.   Knee Fold  Lie on back, legs bent, arms by sides. Exhale, lifting knee to chest. Inhale, returning. Keep abdominals flat, navel to spine. Repeat __10__ times, alternating legs. Do __2__ sessions per day.  Knee Drop  Keep pelvis stable. Without rotating hips, slowly drop knee to side, pause, return to center, bring knee across midline toward opposite hip. Feel obliques engaging. Repeat for ___10_ times each leg.   Copyright  VHI. All rights reserved.       Heel Slide to Straight   Slide one leg down to straight. Return. Be sure pelvis does not rock forward, tilt, rotate, or tip to side. Do _10__ times. Restabilize pelvis. Repeat with other leg. Do __1-2_ sets, __2_ times per day.  http://ss.exer.us/16   Copyright  VHI. All rights reserved.    Hamstring Stretch, Reclined (Strap, Doorframe)    Lengthen bottom leg on floor. Extend top leg along edge of doorframe or press foot up into yoga strap. Hold for __3__ breaths. Repeat __3__ times each leg.  Copyright  VHI. All rights reserved.

## 2019-03-10 ENCOUNTER — Ambulatory Visit: Payer: Medicaid Other | Admitting: Physical Therapy

## 2019-03-10 ENCOUNTER — Encounter: Payer: Self-pay | Admitting: Physical Therapy

## 2019-03-10 DIAGNOSIS — R209 Unspecified disturbances of skin sensation: Secondary | ICD-10-CM

## 2019-03-10 DIAGNOSIS — M5441 Lumbago with sciatica, right side: Principal | ICD-10-CM

## 2019-03-10 DIAGNOSIS — M797 Fibromyalgia: Secondary | ICD-10-CM

## 2019-03-10 DIAGNOSIS — M6281 Muscle weakness (generalized): Secondary | ICD-10-CM

## 2019-03-10 DIAGNOSIS — G8929 Other chronic pain: Secondary | ICD-10-CM
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
## 2019-03-12 ENCOUNTER — Other Ambulatory Visit: Payer: Self-pay

## 2019-03-12 ENCOUNTER — Ambulatory Visit: Payer: Medicaid Other | Admitting: Physical Therapy

## 2019-03-12 ENCOUNTER — Encounter: Payer: Self-pay | Admitting: Physical Therapy

## 2019-03-12 DIAGNOSIS — M797 Fibromyalgia: Secondary | ICD-10-CM

## 2019-03-12 DIAGNOSIS — G8929 Other chronic pain: Secondary | ICD-10-CM

## 2019-03-12 DIAGNOSIS — M5441 Lumbago with sciatica, right side: Principal | ICD-10-CM

## 2019-03-12 DIAGNOSIS — M6281 Muscle weakness (generalized): Secondary | ICD-10-CM

## 2019-03-12 DIAGNOSIS — R209 Unspecified disturbances of skin sensation: Secondary | ICD-10-CM

## 2019-03-12 NOTE — Patient Instructions (Signed)
Step 1  Step 2  Squat reps: 10  sets: 2  hold: 5  daily: 1  weekly: 7 Setup  Begin standing tall with your feet shoulder width apart. Movement  Bend your knees, sitting your hips back with your chest upright. Return to standing and repeat. Tip  Do not let your knees come together during the exercise. Step 1  Step 2  Squatting Anti-Rotation Press reps: 10  sets: 2  hold: 5  daily: 1  weekly: 7 Setup  Begin in a standing upright position holding a bar that is attached to a resistance band or weight stack cable anchored at your side.  Movement  Squat, then press your arms straight forward. Bring them back and repeat.  Tip  Do not let your trunk rotate as you press the bar forward.
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
## 2019-03-13 DIAGNOSIS — L732 Hidradenitis suppurativa: Principal | ICD-10-CM

## 2019-03-13 MED ORDER — METFORMIN 500 MG TABLET
ORAL_TABLET | Freq: Two times a day (BID) | ORAL | 3 refills | 0.00000 days | Status: CP
Start: 2019-03-13 — End: 2020-03-12

## 2019-03-17 ENCOUNTER — Other Ambulatory Visit: Payer: Self-pay

## 2019-03-17 ENCOUNTER — Encounter: Payer: Self-pay | Admitting: Physical Therapy

## 2019-03-17 ENCOUNTER — Ambulatory Visit: Payer: Medicaid Other | Admitting: Physical Therapy

## 2019-03-17 DIAGNOSIS — M6281 Muscle weakness (generalized): Secondary | ICD-10-CM

## 2019-03-17 DIAGNOSIS — R209 Unspecified disturbances of skin sensation: Secondary | ICD-10-CM

## 2019-03-17 DIAGNOSIS — M5441 Lumbago with sciatica, right side: Secondary | ICD-10-CM | POA: Diagnosis not present

## 2019-03-17 DIAGNOSIS — G8929 Other chronic pain: Secondary | ICD-10-CM

## 2019-03-17 DIAGNOSIS — M797 Fibromyalgia: Secondary | ICD-10-CM
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
## 2019-03-19 ENCOUNTER — Ambulatory Visit: Payer: Medicaid Other | Admitting: Physical Therapy

## 2019-03-19 ENCOUNTER — Other Ambulatory Visit: Payer: Self-pay

## 2019-03-19 DIAGNOSIS — R209 Unspecified disturbances of skin sensation: Secondary | ICD-10-CM

## 2019-03-19 DIAGNOSIS — M5441 Lumbago with sciatica, right side: Principal | ICD-10-CM

## 2019-03-19 DIAGNOSIS — G8929 Other chronic pain: Secondary | ICD-10-CM

## 2019-03-19 DIAGNOSIS — M6281 Muscle weakness (generalized): Secondary | ICD-10-CM

## 2019-03-19 DIAGNOSIS — M797 Fibromyalgia: Secondary | ICD-10-CM

## 2019-03-19 NOTE — Patient Instructions (Signed)
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
## 2019-03-24 ENCOUNTER — Ambulatory Visit: Payer: Medicaid Other | Admitting: Physical Therapy

## 2019-03-26 ENCOUNTER — Ambulatory Visit: Payer: Medicaid Other | Admitting: Physical Therapy

## 2019-03-30 ENCOUNTER — Ambulatory Visit: Payer: Medicaid Other | Admitting: Physical Therapy

## 2019-04-01 ENCOUNTER — Ambulatory Visit: Payer: Medicaid Other | Admitting: Physical Therapy

## 2019-04-09 MED ORDER — ADALIMUMAB PEN CITRATE FREE 40 MG/0.4 ML
SUBCUTANEOUS | 11 refills | 28.00000 days | Status: CP
Start: 2019-04-09 — End: ?

## 2019-04-09 MED ORDER — ADALIMUMAB 80 MG/0.8 ML SUBCUTANEOUS PEN KIT
SUBCUTANEOUS | 0 refills | 0.00000 days | Status: CP
Start: 2019-04-09 — End: ?
  Filled 2019-04-16: qty 3, 28d supply, fill #0

## 2019-04-09 NOTE — Unmapped (Signed)
Called patient to discuss hidradenitis suppurativa flare. At her last visit, we started metformin to help gain better control of her HS. We discussed the risks and benefits of other medical options including spironolactone (although patient is on HCTZ increasing the risk of hyperkalemia) vs Humira (patient has a history of SLE currently on plaquenil). We thus started metformin about 1 month ago, but patient left a message with our staff saying that she is still flaring. I was unable to reach her but did leave a voice message that we will go ahead and start Humira in the setting of her persistent flare. Before starting the medication, she will need to obtain a quantiferon TB test.     Medication for Humira sent to [facility] pharmacy for further processing.     Dosing: Humira 160 mg day 1, then 80 mg on day 15 then 40 mg every week

## 2019-04-10 NOTE — Unmapped (Signed)
[facility] Specialty Medication Referral: PA Approved      Medication (Brand/Generic): HUMIRA STARTER AND MAINTENANCE    Final Test Claim completed with resulted information below:    Patient ABLE to fill at [facility]  Insurance Company:  [place] MEDICAID  Anticipated Copay: $3  Is anticipated copay with a copay card or grant? No, there is no need for grant or copay assistance.     Does patient's insurance plan only allow a 15 day supply for the first 6 fills in the [facility]? NO  If yes, inform patient they can request to dis-enroll from the [facility] by calling the patient help desk at NOT APPLICABLE.      If the copay is under the $25 defined limit, per policy there will be no further investigation of need for financial assistance at this time unless patient requests. This referral has been communicated to the provider and handed off to the [facility] team for further processing and filling of prescribed medication.   ______________________________________________________________________  Please utilize this referral for viewing purposes as it will serve as the central location for all relevant documentation and updates.

## 2019-04-13 LAB — QUANTIFERON TB GOLD PLUS
QUANTIFERON MITOGEN VALUE: >10 [IU]/mL
QUANTIFERON NIL VALUE: 0.09 [IU]/mL
QUANTIFERON TB GOLD: NEGATIVE
QUANTIFERON TB1 AG VALUE: 0.05 [IU]/mL
QUANTIFERON TB2 AG VALUE: 0.05 [IU]/mL

## 2019-04-14 NOTE — Unmapped (Signed)
Informed patient of normal TB test via my chart. OK to start Humira.

## 2019-04-15 MED ORDER — EMPTY CONTAINER
2 refills | 0 days
Start: 2019-04-15 — End: ?

## 2019-04-15 NOTE — Unmapped (Signed)
Returned call to patient regarding Humira Rx. Patient did not answer. Left vm letting patient know that her rx will be mailed to her and that the pharmacy would be in contact with her to discuss this.

## 2019-04-15 NOTE — Unmapped (Signed)
[facility]   Patient Onboarding/Medication Counseling    Kim Charles is a 38 y.o. female with HS who I am counseling today on Re-initiation of therapy.  I am speaking to the patient.    Verified patient's date of birth / HIPAA.    Specialty medication(s) to be sent: Inflammatory Disorders: Humira      Non-specialty medications/supplies to be sent: sharps container      Medications not needed at this time: na         Humira (adalimumab)    Medication & Administration     Dosage: Pediatric Crohn's disease (40kg or more): Inject 160mg  under the skin on day 1, then 80mg  on day 15, then 40mg  every 14 days starting on day 29    Lab tests required prior to treatment initiation:  ? Tuberculosis: Tuberculosis screening resulted in a non-reactive Quantiferon TB Gold assay.  ? Hepatitis B: Hepatitis B serology studies are complete and non-reactive.    Administration:     Prefilled auto-injector pen  1. Gather all supplies needed for injection on a clean, flat working surface: medication pen removed from packaging, alcohol swab, sharps container, etc.  2. Look at the medication label - look for correct medication, correct dose, and check the expiration date  3. Look at the medication - the liquid visible in the window on the side of the pen device should appear clear and colorless  4. Lay the auto-injector pen on a flat surface and allow it to warm up to room temperature for at least 30-45 minutes  5. Select injection site - you can use the front of your thigh or your belly (but not the area 2 inches around your belly button); if someone else is giving you the injection you can also use your upper arm in the skin covering your triceps muscle  6. Prepare injection site - wash your hands and clean the skin at the injection site with an alcohol swab and let it air dry, do not touch the injection site again before the injection  7. Pull the 2 safety caps straight off - gray/white to uncover the needle cover and the plum cap to uncover the plum activator button, do not remove until immediately prior to injection and do not touch the white needle cover  8. Gently squeeze the area of cleaned skin and hold it firmly to create a firm surface at the selected injection site  9. Put the white needle cover against your skin at the injection site at a 90 degree angle, hold the pen such that you can see the clear medication window  10. Press down and hold the pen firmly against your skin, press the plum activator button to initiate the injection, there will be a click when the injection starts  11. Continue to hold the pen firmly against your skin for about 10-15 seconds - the window will start to turn solid yellow  12. To verify the injection is complete after 10-15 seconds, look and ensure the window is solid yellow and then pull the pen away from your skin  13. Dispose of the used auto-injector pen immediately in your sharps disposal container the needle will be covered automatically  14. If you see any blood at the injection site, press a cotton ball or gauze on the site and maintain pressure until the bleeding stops, do not rub the injection site    Adherence/Missed dose instructions:  If your injection is given more than 3  days after your scheduled injection date ??? consult your pharmacist for additional instructions on how to adjust your dosing schedule.    Goals of Therapy     - Reduce the frequency and severity of new lesions  - Minimize pain and suppuration  - Prevent disease progression and limit scarring  - Maintenance of effective psychosocial functioning    Side Effects & Monitoring Parameters     ? Injection site reaction (redness, irritation, inflammation localized to the site of administration)  ? Signs of a common cold ??? minor sore throat, runny or stuffy nose, etc.  ? Upset stomach  ? Headache    The following side effects should be reported to the provider:  ? Signs of a hypersensitivity reaction ??? rash; hives; itching; red, swollen, blistered, or peeling skin; wheezing; tightness in the chest or throat; difficulty breathing, swallowing, or talking; swelling of the mouth, face, lips, tongue, or throat; etc.  ? Reduced immune function ??? report signs of infection such as fever; chills; body aches; very bad sore throat; ear or sinus pain; cough; more sputum or change in color of sputum; pain with passing urine; wound that will not heal, etc.  Also at a slightly higher risk of some malignancies (mainly skin and blood cancers) due to this reduced immune function.  o In the case of signs of infection ??? the patient should hold the next dose of Humira?? and call your primary care provider to ensure adequate medical care.  Treatment may be resumed when infection is treated and patient is asymptomatic.  ? Changes in skin ??? a new growth or lump that forms; changes in shape, size, or color of a previous mole or marking  ? Signs of unexplained bruising or bleeding ??? throwing up blood or emesis that looks like coffee grounds; black, tarry, or bloody stool; etc.  ? Signs of new or worsening heart failure ??? shortness of breath; sudden weight gain; heartbeat that is not normal; swelling in the arms or legs that is new or worse      Contraindications, Warnings, & Precautions     ? Have your bloodwork checked as you have been told by your prescriber  ? Talk with your doctor if you are pregnant, planning to become pregnant, or breastfeeding  ? Discuss the possible need for holding your dose(s) of Humira?? when a planned procedure is scheduled with the prescriber as it may delay healing/recovery timeline       Drug/Food Interactions     ? Medication list reviewed in Epic. The patient was instructed to inform the care team before taking any new medications or supplements. No drug interactions identified.   ? Talk with you prescriber or pharmacist before receiving any live vaccinations while taking this medication and after you stop taking it Storage, Handling Precautions, & Disposal     ? Store this medication in the refrigerator.  Do not freeze  ? If needed, you may store at room temperature for up to 14 days  ? Store in [employer], protected from light  ? Do not shake  ? Dispose of used syringes/pens in a sharps disposal container            Current Medications (including OTC/herbals), Comorbidities and Allergies     Current Outpatient Medications   Medication Sig Dispense Refill   ??? acyclovir (ZOVIRAX) 400 MG tablet Take 1 tablet by mouth by mouth 2 times daily for prophylaxis  6   ??? adalimumab 80 mg/0.8 mL  PnKt INJECT 2 PENS (160MG ) UNDER THE SKIN ON DAY 1, THEN 1 PEN (80MG ) ON DAY 15. 3 each 0   ??? ADALIMUMAB PEN CITRATE FREE 40 MG/0.4 ML Inject 0.4 mL (40 mg total) under the skin every seven (7) days. 4 each 11   ??? albuterol (PROVENTIL HFA;VENTOLIN HFA) 90 mcg/actuation inhaler Inhale 2 puffs daily as needed.      ??? amLODIPine (NORVASC) 5 MG tablet Take 5 mg by mouth daily.     ??? aspirin-acetaminophen-caffeine (EXCEDRIN MIGRAINE) 250-250-65 mg per tablet Take 2 tablets by mouth.     ??? beclomethasone (QVAR) 40 mcg/actuation inhaler Inhale 2 puffs daily as needed.      ??? beclomethasone (QVAR) 80 mcg/actuation inhaler Inhale 1 puff.     ??? cloNIDine HCl (CATAPRES) 0.1 MG tablet Take 0.1 mg by mouth Two (2) times a day.     ??? dexlansoprazole (DEXILANT) 60 mg capsule Take 60 mg by mouth.     ??? doxycycline (VIBRAMYCIN) 100 MG capsule TAKE 1 CAPSULE BY MOUTH 2 TIMES DAILY FOR 7 DAYS     ??? empty container Misc Use as directed to dispose of Humira pens. 1 each 2   ??? EPINEPHrine (EPIPEN) 0.3 mg/0.3 mL injection Inject 0.3 mg into the muscle.     ??? escitalopram oxalate (LEXAPRO) 10 MG tablet Take 10 mg by mouth daily.  3   ??? fluticasone propionate (FLOVENT HFA) 220 mcg/actuation inhaler 2 puffs.     ??? folic acid (FOLVITE) 1 MG tablet Take 1 tablet (1 mg total) by mouth daily. 90 tablet 3   ??? gabapentin (NEURONTIN) 300 MG capsule Take 300 mg by mouth Three (3) times a day.     ??? gabapentin enacarbil 600 mg TbER Take 1 tablet by mouth.     ??? hydroCHLOROthiazide (HYDRODIURIL) 25 MG tablet Take 12.5 mg by mouth.     ??? HYDROcodone-acetaminophen (NORCO 10-325) 10-325 mg per tablet TAKE 1 TABLET BY MOUTH EVERY SIX HOURS AS NEEDED FOR PAIN  0   ??? hydroxychloroquine (PLAQUENIL) 200 mg tablet Take 1 tablet (200 mg total) by mouth Two (2) times a day. 60 tablet 11   ??? ipratropium (ATROVENT) 0.03 % nasal spray 2 sprays into each nostril.     ??? lisdexamfetamine (VYVANSE) 50 MG capsule Take 60 mg by mouth daily at 0600.      ??? metFORMIN (GLUCOPHAGE) 500 MG tablet Take 1 tablet (500 mg total) by mouth 2 (two) times a day with meals. 180 tablet 3   ??? methocarbamol (ROBAXIN) 500 MG tablet Take 1/2 to 1 tablet by mouth 2 times daily as needed for spasms(will make you drowsy)     ??? methotrexate 2.5 MG tablet Take 6 tablets (15 mg total) by mouth once a week. 72 tablet 3   ??? MITIGARE 0.6 mg cap capsule Take 0.6 mg by mouth daily.     ??? montelukast (SINGULAIR) 10 mg tablet Take 10 mg by mouth nightly.     ??? predniSONE (DELTASONE) 5 MG tablet Taper: 20 mg daily x 2 weeks, 15 mg daily x 2 weeks, 10 mg daily x 2 weeks, 5 mg daily x 2 weeks, 5 mg every other day x 2 weeks, then OFF. 150 tablet 0   ??? propranolol (INDERAL) 40 MG tablet Take 40 mg by mouth Two (2) times a day.      ??? topiramate (TOPAMAX) 50 MG tablet TAKE 1 TABLET BY MOUTH 2 TIMES DAILY  5   ??? traZODone (  DESYREL) 50 MG tablet Take 50 mg by mouth.     ??? triamterene-hydrochlorothiazide (MAXZIDE-25) 37.5-25 mg per tablet Take 0.5 tablets by mouth daily.     ??? VYVANSE 70 mg capsule Take 30 mg by mouth two (2) times a day.   0     No current facility-administered medications for this visit.        Allergies   Allergen Reactions   ??? Other Hives, Itching and Other (See Comments)     High fever. Hives and itching all over the body.   ??? Shellfish Containing Products Anaphylaxis   ??? Sulfasalazine Hives   ??? Venom-Honey Bee Anaphylaxis   ??? Opioids - Morphine Analogues      Other reaction(s): Other  Other reaction(s): Other (See Comments)  Causes migraine  Headache   ??? Tramadol Nausea And Vomiting   ??? Hydromorphone Hcl      migraine   ??? Naproxen      Grits teeth       Patient Active Problem List   Diagnosis   ??? Connective tissue disease, undifferentiated (CMS-HCC)   ??? Tobacco abuse   ??? Fibromyalgia   ??? Hidradenitis suppurativa       Reviewed and up to date in Epic.    Appropriateness of Therapy     Is medication and dose appropriate based on diagnosis? Yes    Baseline Quality of Life Assessment      How many days over the past month did your HS keep you from your normal activities? 0    Financial Information     Medication Assistance provided: Prior Authorization    Anticipated copay of $3 reviewed with patient. Verified delivery address.    Delivery Information     Scheduled delivery date: Friday, April 17      Expected start date: Friday, April 17    Medication will be delivered via UPS to the home address in [place].  This shipment will not require a signature.      Explained the services we provide at [facility] and that each month we would call to set up refills.  Stressed importance of returning phone calls so that we could ensure they receive their medications in time each month.  Informed patient that we should be setting up refills 7-10 days prior to when they will run out of medication.  A pharmacist will reach out to perform a clinical assessment periodically.  Informed patient that a welcome packet and a drug information handout will be sent.      Patient verbalized understanding of the above information as well as how to contact the pharmacy at [phone] option 4 with any questions/concerns.  The pharmacy is open Monday through Friday 8:30am-4:30pm.  A pharmacist is available 24/7 via pager to answer any clinical questions they may have.    Patient Specific Needs     ? Does the patient have any physical, cognitive, or cultural barriers? No    ? Patient prefers to have medications discussed with  Patient     ? Is the patient able to read and understand education materials at a high school level or above? No    ? Patient's primary language is  English     ? Is the patient high risk? No     ? Does the patient require a Care Management Plan? No     ? Does the patient require physician intervention or other additional services (i.e. nutrition, smoking cessation,  social work)? No      Gyneth Hubka A Desiree Lucy [facility] Specialty Pharmacist

## 2019-04-16 MED FILL — EMPTY CONTAINER: 120 days supply | Qty: 1 | Fill #0 | Status: AC

## 2019-04-16 MED FILL — HUMIRA PEN CITRATE FREE STARTER PACK FOR CROHN'S/UC/HS 3 X 80 MG/0.8 ML: 28 days supply | Qty: 3 | Fill #0 | Status: AC

## 2019-04-16 MED FILL — EMPTY CONTAINER: 120 days supply | Qty: 1 | Fill #0

## 2019-04-17 NOTE — Unmapped (Signed)
I received a call from Kim Charles to review injection technique with Humira. When she removed the caps, she reported that medication went everywhere. It's unclear if she pressed button when trying to remove cap? I successfully talked her through use of one 80 mg pen (no issues), and she'll plan to use the 3rd pen in her pack today to result in total loading dose of 160 mg.    I'll go ahead and send pack of 40 mg pens to be delivered on 5/1, and she'll use two pens to equal the 80 mg pen that she would have otherwise done on 5/1.     Kim Charles    Specialty Medication(s) to be Shipped:   Inflammatory Disorders: Humira    Other medication(s) to be shipped: na     Kim Charles, DOB: 1981/04/21  Phone: [phone] (home)       All above HIPAA information was verified with patient.     Completed refill call assessment today to schedule patient's medication shipment from the [facility] [phone]).       Specialty medication(s) and dose(s) confirmed: Regimen is correct and unchanged.   Changes to medications: Kim Charles reports no changes at this time.  Changes to insurance: No  Questions for the pharmacist: Yes: see above    Confirmed patient received Welcome Packet with first shipment. The patient will receive a drug information handout for each medication shipped and additional FDA Medication Guides as required.       DISEASE/MEDICATION-SPECIFIC INFORMATION        For patients on injectable medications: Patient currently has 0 doses left.  Next injection is scheduled for 5/1.    SPECIALTY MEDICATION ADHERENCE      Humira - 0 left    Adherence - n/a - started dosing today        SHIPPING     Shipping address confirmed in Epic.     Delivery Scheduled: Yes, Expected medication delivery date: Friday, 5/1.     Medication will be delivered via UPS to the home address in Epic WAM.    Kim Charles [facility] Specialty Pharmacist

## 2019-04-25 NOTE — Unmapped (Signed)
Called patient to clarify presentation.   Patient reports that she is taking methotrexate 15 mg po qweek, folic acid 1 mg daily, hydroxychloroquine 400 mg po qd, and Humira every other week.    She is complaining of right foot pain and swelling for the past three weeks. If foot is elevated or she is wearing compression, the swelling improves. There is no redness or warmth.  Swelling involves the top of the foot and ankle.    She reports bilateral leg swelling but no swelling in the left foot, only in right foot. She is on diuretics.     Swelling in legs also started three weeks ago. No new medications in past three weeks. Has history of gout but current presentation not consistent per patient.     Discussed main concern would be development of DVT, particularly given asymmetry. She does report shortness of breath. No chest pain.    Will order LE doppler and chest x-ray. Also labs to see if medication effect. Counseled patient if she worsens to go to ED. She agrees.

## 2019-04-27 ENCOUNTER — Ambulatory Visit (HOSPITAL_COMMUNITY): Payer: Medicaid Other

## 2019-04-27 ENCOUNTER — Other Ambulatory Visit (HOSPITAL_COMMUNITY): Payer: Self-pay | Admitting: *Deleted

## 2019-04-27 ENCOUNTER — Other Ambulatory Visit (HOSPITAL_COMMUNITY): Payer: Self-pay | Admitting: Physician Assistant

## 2019-04-27 ENCOUNTER — Ambulatory Visit: Admit: 2019-04-27 | Discharge: 2019-04-28 | Payer: MEDICAID

## 2019-04-27 DIAGNOSIS — R609 Edema, unspecified: Secondary | ICD-10-CM

## 2019-04-27 DIAGNOSIS — M3500 Sicca syndrome, unspecified: Principal | ICD-10-CM

## 2019-04-27 DIAGNOSIS — R600 Localized edema: Secondary | ICD-10-CM

## 2019-04-27 LAB — URINALYSIS WITH CULTURE REFLEX
BLOOD UA: NEGATIVE
GLUCOSE UA: NEGATIVE
KETONES UA: NEGATIVE
LEUKOCYTE ESTERASE UA: NEGATIVE
NITRITE UA: NEGATIVE
PH UA: 8.5 (ref 5.0–9.0)
PROTEIN UA: NEGATIVE
RBC UA: 3 /HPF (ref <4)
SPECIFIC GRAVITY UA: 1.02 (ref 1.005–1.040)
SPECIFIC GRAVITY UA: 1.02 /HPF — ABNORMAL HIGH (ref 1.005–1.040)
SQUAMOUS EPITHELIAL: >50 /HPF — ABNORMAL HIGH (ref 0–5)
UROBILINOGEN UA: 1

## 2019-04-27 LAB — CBC W/ AUTO DIFF
BASOPHILS ABSOLUTE COUNT: 0.1 10*9/L (ref 0.0–0.1)
BASOPHILS RELATIVE PERCENT: 1 %
EOSINOPHILS ABSOLUTE COUNT: 0.2 10*9/L (ref 0.0–0.4)
EOSINOPHILS RELATIVE PERCENT: 1.9 %
HEMATOCRIT: 44 % (ref 36.0–46.0)
HEMOGLOBIN: 14.2 g/dL (ref 13.5–16.0)
LARGE UNSTAINED CELLS: 4 % (ref 0–4)
LYMPHOCYTES ABSOLUTE COUNT: 3.1 10*9/L (ref 1.5–5.0)
LYMPHOCYTES RELATIVE PERCENT: 34.7 %
MEAN CORPUSCULAR HEMOGLOBIN CONC: 32.2 g/dL (ref 31.0–37.0)
MEAN CORPUSCULAR HEMOGLOBIN: 29.8 pg (ref 26.0–34.0)
MEAN CORPUSCULAR VOLUME: 92.6 fL (ref 80.0–100.0)
MEAN PLATELET VOLUME: 7.7 fL (ref 7.0–10.0)
MEAN PLATELET VOLUME: 7.7 fL — ABNORMAL HIGH (ref 7.0–10.0)
MONOCYTES ABSOLUTE COUNT: 0.5 10*9/L (ref 0.2–0.8)
MONOCYTES RELATIVE PERCENT: 5.1 %
NEUTROPHILS ABSOLUTE COUNT: 4.7 10*9/L (ref 2.0–7.5)
NEUTROPHILS RELATIVE PERCENT: 53.3 %
RED BLOOD CELL COUNT: 4.75 10*12/L (ref 4.00–5.20)
WBC ADJUSTED: 8.9 10*9/L (ref 4.5–11.0)

## 2019-04-27 LAB — PROTEIN / CREATININE RATIO, URINE: CREATININE, URINE: 118.3 mg/dL

## 2019-04-27 LAB — D-DIMER, QUANTITATIVE: D-DIMER QUANTITATIVE (CH,ML,PD,ET): <150 ng/mL

## 2019-04-27 LAB — C-REACTIVE PROTEIN: C-REACTIVE PROTEIN: 6.3 mg/L (ref <10.0)

## 2019-04-27 LAB — CREATININE: CREATININE: 0.58 mg/dL — ABNORMAL LOW (ref 0.60–1.00)

## 2019-04-27 NOTE — Unmapped (Signed)
Reason for call:   Patient states that her PCP(Dr. Pavlock) wants her to go to the [facility] they need information to faxed to them concerning her right leg and possible blood clots.    [facility] fax to the attn: Lupita Leash [phone]    Patient is trying to go today. Pain is getting worse above her knee, husband took off work today to take her to [employer].     Last ov: 03/05/2019  Next ov: 07/07/2019

## 2019-04-27 NOTE — Unmapped (Signed)
Auth completed for the patient to have her Doppler at [facility].    Auth# [id number]  CPT# 40981    Scheduled patient for 2:30 today.  Patient aware.     Dr. Scarlette Calico aware as well.

## 2019-04-29 LAB — ANA
ANA TITER 1: 1:160 {titer}
ANA TITER 2: 1:160 {titer}
ANA TITER 2: 1:160 {titer} — AB
ANTINUCLEAR ANTIBODIES (ANA): POSITIVE — AB

## 2019-04-30 MED FILL — HUMIRA PEN CITRATE FREE 40 MG/0.4 ML: 28 days supply | Qty: 4 | Fill #0 | Status: AC

## 2019-04-30 MED FILL — HUMIRA PEN CITRATE FREE 40 MG/0.4 ML: SUBCUTANEOUS | 28 days supply | Qty: 4 | Fill #0

## 2019-05-06 LAB — ENA PANEL
ANTI-RNP AB: NEGATIVE
ANTI-SM AB: NEGATIVE
ANTI-SSB AB: NEGATIVE

## 2019-05-06 LAB — ANTI-SSA AB: Lab: POSITIVE — AB

## 2019-05-07 NOTE — Unmapped (Signed)
Reason for call:   [employer] will send a disability form to be completed by the physician. Form be faxed today.        Last ov: 03/05/2019  Next ov: 07/07/2019

## 2019-05-12 NOTE — Unmapped (Signed)
Tuesday reports doing OK back on her Humira. She reports she had a flare at the beginning, but the HS areas have resolved.    Of note, her mychart messages indicate low complement/concern for lupus. Patient reported no new symptoms (rash, arthritis, etc), and rheumatology is following closely.     [facility] Clinical Assessment & Refill Coordination Note    Andrey Spearman, DOB: 19-May-1981  Phone: [phone] (home)     All above HIPAA information was verified with patient.     Specialty Medication(s):   Inflammatory Disorders: Humira     Current Outpatient Medications   Medication Sig Dispense Refill   ??? acyclovir (ZOVIRAX) 400 MG tablet Take 1 tablet by mouth by mouth 2 times daily for prophylaxis  6   ??? adalimumab 80 mg/0.8 mL PnKt INJECT 2 PENS (160MG ) UNDER THE SKIN ON DAY 1, THEN 1 PEN (80MG ) ON DAY 15. 3 each 0   ??? ADALIMUMAB PEN CITRATE FREE 40 MG/0.4 ML Inject 0.4 mL (40 mg total) under the skin every seven (7) days. 4 each 11   ??? albuterol (PROVENTIL HFA;VENTOLIN HFA) 90 mcg/actuation inhaler Inhale 2 puffs daily as needed.      ??? amLODIPine (NORVASC) 5 MG tablet Take 5 mg by mouth daily.     ??? aspirin-acetaminophen-caffeine (EXCEDRIN MIGRAINE) 250-250-65 mg per tablet Take 2 tablets by mouth.     ??? beclomethasone (QVAR) 40 mcg/actuation inhaler Inhale 2 puffs daily as needed.      ??? beclomethasone (QVAR) 80 mcg/actuation inhaler Inhale 1 puff.     ??? cloNIDine HCl (CATAPRES) 0.1 MG tablet Take 0.1 mg by mouth Two (2) times a day.     ??? dexlansoprazole (DEXILANT) 60 mg capsule Take 60 mg by mouth.     ??? doxycycline (VIBRAMYCIN) 100 MG capsule TAKE 1 CAPSULE BY MOUTH 2 TIMES DAILY FOR 7 DAYS     ??? empty container Misc Use as directed to dispose of Humira pens. 1 each 2   ??? EPINEPHrine (EPIPEN) 0.3 mg/0.3 mL injection Inject 0.3 mg into the muscle.     ??? escitalopram oxalate (LEXAPRO) 10 MG tablet Take 10 mg by mouth daily.  3   ??? fluticasone propionate (FLOVENT HFA) 220 mcg/actuation inhaler 2 puffs.     ??? folic acid (FOLVITE) 1 MG tablet Take 1 tablet (1 mg total) by mouth daily. 90 tablet 3   ??? gabapentin (NEURONTIN) 300 MG capsule Take 300 mg by mouth Three (3) times a day.     ??? gabapentin enacarbil 600 mg TbER Take 1 tablet by mouth.     ??? hydroCHLOROthiazide (HYDRODIURIL) 25 MG tablet Take 12.5 mg by mouth.     ??? HYDROcodone-acetaminophen (NORCO 10-325) 10-325 mg per tablet TAKE 1 TABLET BY MOUTH EVERY SIX HOURS AS NEEDED FOR PAIN  0   ??? hydroxychloroquine (PLAQUENIL) 200 mg tablet Take 1 tablet (200 mg total) by mouth Two (2) times a day. 60 tablet 11   ??? ipratropium (ATROVENT) 0.03 % nasal spray 2 sprays into each nostril.     ??? lisdexamfetamine (VYVANSE) 50 MG capsule Take 60 mg by mouth daily at 0600.      ??? metFORMIN (GLUCOPHAGE) 500 MG tablet Take 1 tablet (500 mg total) by mouth 2 (two) times a day with meals. 180 tablet 3   ??? methocarbamol (ROBAXIN) 500 MG tablet Take 1/2 to 1 tablet by mouth 2 times daily as needed for spasms(will make you drowsy)     ???  methotrexate 2.5 MG tablet Take 6 tablets (15 mg total) by mouth once a week. 72 tablet 3   ??? MITIGARE 0.6 mg cap capsule Take 0.6 mg by mouth daily.     ??? montelukast (SINGULAIR) 10 mg tablet Take 10 mg by mouth nightly.     ??? predniSONE (DELTASONE) 5 MG tablet Taper: 20 mg daily x 2 weeks, 15 mg daily x 2 weeks, 10 mg daily x 2 weeks, 5 mg daily x 2 weeks, 5 mg every other day x 2 weeks, then OFF. 150 tablet 0   ??? propranolol (INDERAL) 40 MG tablet Take 40 mg by mouth Two (2) times a day.      ??? topiramate (TOPAMAX) 50 MG tablet TAKE 1 TABLET BY MOUTH 2 TIMES DAILY  5   ??? traZODone (DESYREL) 50 MG tablet Take 50 mg by mouth.     ??? triamterene-hydrochlorothiazide (MAXZIDE-25) 37.5-25 mg per tablet Take 0.5 tablets by mouth daily.     ??? VYVANSE 70 mg capsule Take 30 mg by mouth two (2) times a day.   0     No current facility-administered medications for this visit.         Changes to medications: Dimple reports no changes at this time.    Allergies   Allergen Reactions   ??? Other Hives, Itching and Other (See Comments)     High fever. Hives and itching all over the body.   ??? Shellfish Containing Products Anaphylaxis   ??? Sulfasalazine Hives   ??? Venom-Honey Bee Anaphylaxis   ??? Opioids - Morphine Analogues      Other reaction(s): Other  Other reaction(s): Other (See Comments)  Causes migraine  Headache   ??? Tramadol Nausea And Vomiting   ??? Hydromorphone Hcl      migraine   ??? Naproxen      Grits teeth       Changes to allergies: No    SPECIALTY MEDICATION ADHERENCE     Humira - 2 doses left  Medication Adherence    Patient reported X missed doses in the last month:  0  Specialty Medication:  Humira          Specialty medication(s) dose(s) confirmed: Regimen is correct and unchanged.     Are there any concerns with adherence? No    Adherence counseling provided? Not needed    CLINICAL MANAGEMENT AND INTERVENTION      Clinical Benefit Assessment:    Do you feel the medicine is effective or helping your condition? may be too soon to tell - patient just completed loading    Clinical Benefit counseling provided? Not needed    Adverse Effects Assessment:    Are you experiencing any side effects? No    Are you experiencing difficulty administering your medicine? No    Quality of Life Assessment:    How many days over the past month did your HS  keep you from your normal activities? For example, brushing your teeth or getting up in the morning. 0    Have you discussed this with your provider? Not needed    Therapy Appropriateness:    Is therapy appropriate? Yes, therapy is appropriate and should be continued    DISEASE/MEDICATION-SPECIFIC INFORMATION      For patients on injectable medications: Patient currently has 2 doses left.  Next injection is scheduled for Friday, May 15.    PATIENT SPECIFIC NEEDS     ? Does the patient have any physical, cognitive, or cultural  barriers? No    ? Is the patient high risk? No     ? Does the patient require a Care Management Plan? No     ? Does the patient require physician intervention or other additional services (i.e. nutrition, smoking cessation, social work)? No      SHIPPING     Specialty Medication(s) to be Shipped:   Inflammatory Disorders: Humira    Other medication(s) to be shipped: na     Changes to insurance: No    Delivery Scheduled: Yes, Expected medication delivery date: Wed, May 27.     Medication will be delivered via UPS to the confirmed home address in [facility].    The patient will receive a drug information handout for each medication shipped and additional FDA Medication Guides as required.  Verified that patient has previously received a Conservation officer, historic buildings.    Lanney Gins   [facility] Specialty Pharmacist

## 2019-05-21 NOTE — Unmapped (Signed)
Hi Rosalita Chessman,    Do you mind giving Ms Alto Denver and call to schedule her an appt with Dr.Sayed.    Thanks,  [place]

## 2019-05-21 NOTE — Unmapped (Signed)
05-27-2019 8:00    Do you have any one else that needs to be added on that morning?

## 2019-05-21 NOTE — Unmapped (Signed)
Can you add this patient on for 05-27-2019 in the AM for urgent HS flare up    She is currently scheduled to see Dr. Odis Luster 05-26-2019.

## 2019-05-21 NOTE — Unmapped (Signed)
Call from patient.  Having repeated HS flares.  Taking Humira as ordered but feels this in not helping anymore.  Wants some clindamycin or to possibly be seen.

## 2019-05-26 MED FILL — HUMIRA PEN CITRATE FREE 40 MG/0.4 ML: SUBCUTANEOUS | 28 days supply | Qty: 4 | Fill #1

## 2019-05-26 MED FILL — HUMIRA PEN CITRATE FREE 40 MG/0.4 ML: 28 days supply | Qty: 4 | Fill #1 | Status: AC

## 2019-05-27 ENCOUNTER — Ambulatory Visit: Admit: 2019-05-27 | Discharge: 2019-05-28 | Payer: MEDICAID

## 2019-05-27 DIAGNOSIS — Z79899 Other long term (current) drug therapy: Secondary | ICD-10-CM

## 2019-05-27 DIAGNOSIS — T50905A Adverse effect of unspecified drugs, medicaments and biological substances, initial encounter: Secondary | ICD-10-CM

## 2019-05-27 DIAGNOSIS — M359 Systemic involvement of connective tissue, unspecified: Secondary | ICD-10-CM

## 2019-05-27 DIAGNOSIS — R112 Nausea with vomiting, unspecified: Secondary | ICD-10-CM

## 2019-05-27 DIAGNOSIS — L732 Hidradenitis suppurativa: Principal | ICD-10-CM

## 2019-05-27 MED ORDER — PROMETHAZINE 25 MG TABLET
ORAL_TABLET | Freq: Four times a day (QID) | ORAL | 3 refills | 0.00000 days | Status: CP | PRN
Start: 2019-05-27 — End: ?

## 2019-05-27 MED ORDER — LEUCOVORIN CALCIUM 5 MG TABLET
ORAL_TABLET | ORAL | 3 refills | 0.00000 days | Status: CP
Start: 2019-05-27 — End: ?

## 2019-05-27 NOTE — Unmapped (Signed)
Dermatology Clinic Note    Assessment and Plan:      Hidradenitis Suppurativa (HS):   Hurley Stage II: one or more widely separated recurrent abscesses, with sinus tract and scar formation  Location: axillae, intermammary regions, groin  DLQI: 30/30  - discussed the chronic, relapsing nature of this skin disease, characterized by recurring inflamed painful nodules with abscess and sinus formation, and scarring  - explained to patient that usually early lesion can remit with medical treatment, but once sinus tracts are established treatment options are limited and surgery is the only definitive treatment; however, recurrence rate can be up to 25% even with surgery. Patient would like the following areas treated: 1 in the right axilla, 2 in the left inguinal, and 1 in the right inguinal regions  - She is followed by Pain Management, Dr. Alwyn Ren in [place], [place].  - Discussed that smoking cessation may help improve HS outcomes  - Discussed risks and benefits of the following treatment options: surgery, adalimumab, spironolactone, laser therapy, oral antibiotics  - Patient declines oral antibiotics at this time  - She would like to proceed with surgical unroofing of the right axilla (x1), left inguinal (x2) and right inguinal (x1) regions, all focal areas ~2-3cm in size, but will delay until later in the summer after vacation.  - Will followup about start of spironolactone in the futre  - Patient would also like to proceed with laser treatment at follow-ups  - had previously discussed Humira with her rheumatologist given her history of SLE and Sjogren's and he was okay with starting.  Currently being treated with methotrexate and plaquenil.     Nausea from methotrexate: change folic acid to leucovorin 5mg  every 12 hours for 2 doses starting 12 hours after methotrexate.  -promethazine 25mg  q6H prn for nausea    RTC: For surgery in the next 1-2 months      CC:   HS, return patient    HPI:  [name] is a 38 y.o. female who was last seen by Dr. Janyth Contes on 03/04/2019, and was seen today by Leonette Nutting, MD for HS follow-up.  She has history of SLE and Sjogren's on methotrexate and Plaquenil and was started on Humira in 03/2019.  She feels like this is leading to fewer and less severe flares, though there have been a few nodules in the groin and axillae.  Planning unroofings soon, but she may want to delay due to a beach vacation.  Endorses nausea with methotrexate for 72 hours after each dose.    ( has a past medical history of ADHD (attention deficit hyperactivity disorder), Allergic (2007), Anemia, Asthma, Chronic pain, Disorder of skin or subcutaneous tissue (2001), Fibromyalgia, Hidradenitis suppurativa, Hypertension, Leukemia (CMS-HCC), Lung disease (2017), Lupus (systemic lupus erythematosus) (CMS-HCC), Migraines, Osteoarthritis, and Scoliosis.)    Areas of concern:  Chief Complaint   Patient presents with   ??? HS follow up     Patient states when she started humira she had multiple flares all over, these were mostly new spots and one that kept re-occurring, She states after a little bit of time on the humira these started to go away and she is doing much better now.      Some nausea    Denies any other skin concerns.  Denies any other skin concerns, including no other areas with bleeding, drainage, pain, pruritus, growth, or change.     Past Medical History, Family History, Social History, Meds, Allergies, Problem List  Reviewed in Epic and notable as indicated above      ROS:   Negative for fever, chills, or recent illness. No other skin complaints except as noted in HPI.    PE:  Gen: No acute distress  Neuro: Alert and oriented; answers questions appropriately for age  Skin: exam of areas listed below      location Abscess Inflamed nodule Non-inflamed nodule Draining sinus Non-draining Sinus Hurley % scar   R axilla  1  1      L axilla  1   1     R inframammary          L inframammary          Intermammary Pubic          R inguinal     1     R thigh          L inguinal     2     L thigh          Scrotum/Vulva          Perianal          R buttock          L buttock          Other (list)                      AN count (total sum of abscess and inflammatory nodule): 2    -All other areas examined within normal limits without any skin lesions of concern      The patient was seen and examined by Leonette Nutting, MD who agrees with the assessment and plan as above.

## 2019-05-27 NOTE — Unmapped (Signed)

## 2019-06-15 MED ORDER — HYDROXYCHLOROQUINE 200 MG TABLET
ORAL_TABLET | 11 refills | 0 days | Status: CP
Start: 2019-06-15 — End: ?

## 2019-06-15 NOTE — Unmapped (Signed)
Hydroxychloroquine refill  Last ov: 03/05/2019  Next ov: 07/07/2019

## 2019-06-26 NOTE — Unmapped (Signed)
[facility] Refill Coordination Note    Specialty Medication(s) to be Shipped:   Inflammatory Disorders: Humira    Other medication(s) to be shipped: na     Kim Charles, DOB: April 02, 1981  Phone: [phone] (home)       All above HIPAA information was verified with patient.     Completed refill call assessment today to schedule patient's medication shipment from the [facility] [phone]).       Specialty medication(s) and dose(s) confirmed: Regimen is correct and unchanged.   Changes to medications: Kim Charles reports no changes at this time.  Changes to insurance: No  Questions for the pharmacist: No    Confirmed patient received Welcome Packet with first shipment. The patient will receive a drug information handout for each medication shipped and additional FDA Medication Guides as required.       DISEASE/MEDICATION-SPECIFIC INFORMATION        N/A    SPECIALTY MEDICATION ADHERENCE     Medication Adherence    Patient reported X missed doses in the last month:  1  Specialty Medication:  Humira CF 40 mg/0.4 ml  Patient is on additional specialty medications:  No  Patient is on more than two specialty medications:  No  Any gaps in refill history greater than 2 weeks in the last 3 months:  no  Demonstrates understanding of importance of adherence:  yes  Informant:  patient  Reliability of informant:  reliable  Confirmed plan for next specialty medication refill:  delivery by pharmacy  Refills needed for supportive medications:  not needed                Humira CF 40 mg/0.4 ml. 0 on hand. Next dose is due 7/3      SHIPPING     Shipping address confirmed in Epic.     Delivery Scheduled: Yes, Expected medication delivery date: 070220.     Medication will be delivered via UPS to the home address in Epic WAM.    Kim Charles Kim Charles   [facility] Specialty Technician

## 2019-06-29 ENCOUNTER — Ambulatory Visit: Admit: 2019-06-29 | Discharge: 2019-06-30 | Payer: MEDICAID

## 2019-06-29 DIAGNOSIS — R21 Rash and other nonspecific skin eruption: Secondary | ICD-10-CM

## 2019-06-29 DIAGNOSIS — L309 Dermatitis, unspecified: Principal | ICD-10-CM

## 2019-06-29 DIAGNOSIS — L732 Hidradenitis suppurativa: Secondary | ICD-10-CM

## 2019-06-29 MED ORDER — TRIAMCINOLONE ACETONIDE 0.1 % TOPICAL CREAM
Freq: Two times a day (BID) | TOPICAL | 0 refills | 0.00000 days | Status: CP
Start: 2019-06-29 — End: 2020-06-28

## 2019-06-29 NOTE — Unmapped (Signed)
I saw and evaluated the patient, participating in the key elements of the service.  I discussed the findings, assessment and plan with the resident and agree with resident’s findings and plan as documented in the resident's note.  I was immediately available for the entirety of the procedure(s) and present for the key and critical portions. Nili Honda J Twana Wileman, MD

## 2019-06-29 NOTE — Unmapped (Signed)
Dermatology Clinic Note    Assessment and Plan:    Unroofing procedure for hidradenitis supprativa in the axillae and groin:    Pre-procedure:  Patient Verification with name and date of birth was performed. Site and procedure verified with patient/physician agreement and clinical photograph. Two Health Care Workers involved in verification. Consent form signed at 10:35 AM. Personnel present during procedure: Dr. Janyth Contes, Dr. Rosalee Kaufman, Lowella Bandy. The size and nature of the procedure as well as typical scarring were discussed along with (among others) the risks of bleeding, infection and lesion recurrence.  ??  Unroofing procedure (L axilla x 1, R axilla x 3, L labia, R inguinal x1):  Lidocaine 0.5% with epinephrine was infiltrated at the site with a total of 27 mL. The surgical site was marked, cleansed with chlorhexidine, and draped in a sterile fashion. The fistula probe and/or forceps was used to delineate the involved area. Excision and beveling was performed with scissor or blade as appropriate to assure second intention healing.  Hemostasis was obtained in the usual fashion with pressure and drysol solution. Total size of wound: L axilla- 2 x 2 cm, R axilla- 1 x 2 cm, L labia partial vulvectomy-2 cm x 3 cm, R inguinal 2 cm x 1 cm). The area was dressed with petrolatum and bandage. Wound care instructions given.    Hidradenitis Suppurativa (HS), presenting for unroofing today:   Hurley Stage II: one or more widely separated recurrent abscesses, with sinus tract and scar formation  Location: axillae, intermammary regions, groin  DLQI: 30/30  - discussed the chronic, relapsing nature of this skin disease, characterized by recurring inflamed painful nodules with abscess and sinus formation, and scarring  - Patient desired to undergo unroofing today of 3 sites in left axilla, 1 site in right axillae, 1 in right inguinal area, and 1 in left inguinal area (all ~1-3 cm in size).  - She is followed by Pain Management, Dr. Alwyn Ren in [place], [place].  - Discussed that smoking cessation may help improve HS outcomes  - Continue Humira 40 mg weekly  - Previously discussed Humira with her rheumatologist given her history of SLE and Sjogren's and he was okay with starting.  Currently being treated with methotrexate and plaquenil.     Nausea from methotrexate: continue leucovorin 5mg  every 12 hours for 2 doses starting 12 hours after methotrexate.  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 To red, itchy rash until improved as needed  Dispense: 454 g; Refill: 0    RTC: 2 weeks for wound check      CC:   HS, return patient    HPI:  [name] is a 38 y.o. female who was last seen by Dr. Janyth Contes on 05/27/2019, and was seen today by Leonette Nutting, MD for unroofing and HS follow-up.  She has history of SLE and Sjogren's on methotrexate and Plaquenil and was started on Humira in 03/2019.  Continues to feel like this is leading to fewer and less severe flares, though there have been a few continued nodules in the groin and axillae. Presenting today for unroofing. One site is actively inflamed in right inguinal area. Tolerating Humira well. Nausea improved since changing to leucovorin.    Notes itchy rash on outer arms. Started after going to beach in last week. Exacerbated by sun. No prior treatment attempted. Has had in past.     (  has a past medical history of ADHD (attention deficit hyperactivity disorder), Allergic (2007), Anemia, Asthma, Chronic pain, Disorder of skin or subcutaneous tissue (2001), Fibromyalgia, Hidradenitis suppurativa, Hypertension, Leukemia (CMS-HCC), Lung disease (2017), Lupus (systemic lupus erythematosus) (CMS-HCC), Migraines, Osteoarthritis, and Scoliosis.)    Areas of concern:  Chief Complaint   Patient presents with   ??? Procedure     HS surgery underarms, vaginal area.     Denies any other skin concerns. Denies any other skin concerns, including no other areas with bleeding, drainage, pain, pruritus, growth, or change.     Past Medical History, Family History, Social History, Meds, Allergies, Problem List Reviewed in Epic and notable as indicated above      ROS:   Negative for fever, chills, or recent illness. No other skin complaints except as noted in HPI.    PE:  Gen: No acute distress  Neuro: Alert and oriented; answers questions appropriately for age  Skin: exam of areas listed below      location Abscess Inflamed nodule Non-inflamed nodule Draining sinus Non-draining Sinus Hurley % scar   R axilla     3     L axilla     1     R inframammary          L inframammary          Intermammary          Pubic          R inguinal     1     R thigh          L inguinal          L thigh          Scrotum/Vulva  1   1     Perianal          R buttock          L buttock          Other (list)                      AN count (total sum of abscess and inflammatory nodule): 1  Many intertriginous comedones, few diffuse  Facial acne scars, none on trunk  No unique scarring pattern  Scarring folliculitis phenotype  3-10 intertriginous cysts, 3-10 diffuse  -All other areas examined within normal limits without any skin lesions of concern      The patient was seen and examined by Leonette Nutting, MD who agrees with the assessment and plan as above.

## 2019-06-29 NOTE — Unmapped (Addendum)
Managing Your Wound After Skin Surgery  ? Please avoid strenuous activity for 48 hours and all heavy exercise for one week.  o If your wound is on your arm, leg, or shoulder, then avoid heavy lifting, straining, or exercise with that limb for at least one week.  o If your wound is on the head or neck, avoid bending over and sleep with your head elevated for 48 hours to reduce the risk of bleeding.  o Please do not smoke for 3 weeks; smoking prevents healthy wound healing.  ? Pain Management: Use 650mg  of acetaminophen (Tylenol) every 4 hours, or medication that we prescribe as needed.  Pain should decrease steadily for the first few days after your surgery.  o Avoid ibuprofen (Motrin), naproxen (Aleve), aspirin, or alcohol for 48 hours because they can increase the risk of bleeding.  You should continue aspirin if you already take it regularly.  ? Possible complications:  o Bleeding: A small amount of blood on the bandage is normal.  For significant bleeding into the bandage or beneath the stitches (this will look like swelling and purple discoloration), apply firm pressure with a cloth or bandage for 20 minutes without interruption.  Repeat if bleeding continues.  If it persists, please call [phone].  During non-office hours use the message at this number to contact the on-call dermatologist or go to the emergency room.  o Infection:  Usually indicated by pain that increases 4-6 days after surgery. Mild redness, swelling, and soreness are normal, but with increasing redness, pain, drainage, and swelling you should contact our office.  o Decreased sensation, increased sensitivity, and itching may last for 18 months.  o Scarring: Scars mature for one year after surgery.  Reducing motion and tension over the wound for the first month, as well as sun-protection, reduces scarring.    o Bruising is normal around the wound and resolves over 2-3 weeks.  For Wounds with Dissolving Stitches:  ? Please keep the original bandage in place for at least 24 hours.  You can remove the large outer layer at that time and keep the smaller bandage clean and dry for one week or until you return.    ? If the bandage becomes soiled, wet, or detached you can reinforce it with tape or add petrolatum (Vaseline) to the stitches, then place a fresh, clean, non-stick bandage (ie Telfa) with tape or Band-aid.  For Wounds with Stitches that Need to be Removed:  ? Please keep the original bandage in place for at least 24 hours.  You can remove the large outer layer at that time.  You may leave the smaller bandage in place until it falls off by itself, or change it after it gets wet with bathing.  To change, apply a thick layer of petrolatum (Vaseline) over top of the Steri-strips (thin white strips) and cover with a non-stick bandage (ie Telfa) with tape or a  Band-aid.  If you are changing the bandage you can gently remove any crust with warm water and mild soap using a soft cloth or Q-tip.  Do not use triple-antibiotic ointment, Neosporin, or hydrogen peroxide.    For open wounds:  Each day or each time the dressing is soiled or disrupted, gently remove any excess crusting or oozing with a warm wet wash cloth or gently allow water to run over the area. Apply a thick layer of Vaseline, and then re-cover the area with a non-stick bandage and tape or another bandage of your  choosing. The thick layer of Vaseline will help soothe the pain of the wound and allow it to heal faster.    If crusting builds up a warm washcloth with mild soap or a mixture of 1 tablespoon of white vinegar in a cup of warm can be applied to the area for 5-10 minutes and then used to gently wipe away any excess.    Healing is best when the wound fills in from the bottom and sides. If the edges of the wound are close together it is important to keep the Vaseline in the wound and use the non-stick dressing pressed gently into the wound to keep the edges from sealing together first.    After the Stitches and Bandages Have Been Removed:  Clean daily with warm water and gentle soap using a soft cloth.  You can continue to use a small bandage and sunscreen for 1-2 months or until fully healed.

## 2019-06-30 NOTE — Unmapped (Signed)
Called patient. Informed her, per Dr. Williemae Natter he would rather she keep areas away from tap water until Thursday then she may shower.  Patient voiced understanding.

## 2019-06-30 NOTE — Unmapped (Signed)
Call from patient on nurse line this AM.  Wants to know if she can take a shower today since wounds were left open.    I could not find answer in instructions sent home with patient nor in notes.

## 2019-07-01 MED FILL — HUMIRA PEN CITRATE FREE 40 MG/0.4 ML: SUBCUTANEOUS | 28 days supply | Qty: 4 | Fill #2

## 2019-07-01 MED FILL — HUMIRA PEN CITRATE FREE 40 MG/0.4 ML: 28 days supply | Qty: 4 | Fill #2 | Status: AC

## 2019-07-01 NOTE — Unmapped (Signed)
The patient was notified via MyChart that her excision revealed findings consistent with HS.    Lesion A: HS  Tracking: No

## 2019-07-03 ENCOUNTER — Ambulatory Visit
Admission: EM | Admit: 2019-07-03 | Discharge: 2019-07-03 | Disposition: A | Discharge status: Home / Self Care | Payer: Medicaid Other | Attending: Emergency Medicine | Admitting: Emergency Medicine

## 2019-07-03 ENCOUNTER — Other Ambulatory Visit: Payer: Self-pay

## 2019-07-03 DIAGNOSIS — T148XXA Other injury of unspecified body region, initial encounter: Secondary | ICD-10-CM | POA: Diagnosis not present

## 2019-07-03 DIAGNOSIS — L24A9 Irritant contact dermatitis due friction or contact with other specified body fluids: Secondary | ICD-10-CM

## 2019-07-03 DIAGNOSIS — Z4889 Encounter for other specified surgical aftercare: Secondary | ICD-10-CM

## 2019-07-03 MED ORDER — DOXYCYCLINE HYCLATE 100 MG PO CAPS: 100.0000 mg | ORAL_CAPSULE | Freq: Two times a day (BID) | ORAL | 0 refills | Status: DC

## 2019-07-03 NOTE — ED Triage Notes (Signed)
Pt states had HS surgery on Monday, had multiple abscess channels removed. C/o green drainage from one under rt arm. States feels bad and has chills off and on since yesterday
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
## 2019-07-03 NOTE — Discharge Instructions (Signed)
Continue with post-surgical instructions from your surgeon.  Complete course of antibiotics.   If worsening of symptoms- fevers, pain increased drainage please be seen by surgeon or return.

## 2019-07-03 NOTE — Unmapped (Signed)
Received page from Ms [place] regarding green colored drainage from R axilla (site of recent HS unroofing on 6/29). Patient endorsed pain and redness in area. Also reported chills but denied fever. Instructed patient to seek urgent evaluation at Emergency Department or Urgent Care facility due to concern for wound infection. Recommended patient not take Humira or MTX today as scheduled due to concern for infection above. Patient in agreement with above plan and voiced understanding.       Reubin Milan, MD  [facility]

## 2019-07-07 ENCOUNTER — Institutional Professional Consult (permissible substitution): Admit: 2019-07-07 | Discharge: 2019-07-08 | Payer: MEDICAID

## 2019-07-07 DIAGNOSIS — M797 Fibromyalgia: Secondary | ICD-10-CM

## 2019-07-07 DIAGNOSIS — M3500 Sicca syndrome, unspecified: Principal | ICD-10-CM

## 2019-07-07 DIAGNOSIS — L732 Hidradenitis suppurativa: Secondary | ICD-10-CM

## 2019-07-07 MED ORDER — TUBERCULIN SYRINGE 1 ML 25 GAUGE X 5/8"
INJECTION | 1 refills | 0.00000 days | Status: CP
Start: 2019-07-07 — End: 2019-08-20

## 2019-07-07 MED ORDER — METHOTREXATE SODIUM 25 MG/ML INJECTION SOLUTION
SUBCUTANEOUS | 3 refills | 0 days | Status: CP
Start: 2019-07-07 — End: ?
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
## 2019-07-14 ENCOUNTER — Institutional Professional Consult (permissible substitution): Admit: 2019-07-14 | Discharge: 2019-07-15 | Payer: MEDICAID

## 2019-07-14 NOTE — Unmapped (Signed)
Pt presented to clinic for Methotrexate injection training and self-administered first dose. Injection training completed. Pt verbalized understanding of all instructions. Pt demonstrated proper preparation and injection technique with practice materials. Pt then successfully prepared and self administered first dose of Methorexate. No injection site reaction noted. Pt tolerated medication well. Reviewed dosing instructions for leucovorin (Take 1 tablet 12 hours after methotrexate dose, and again 12 hours later). Pt verbalized understanding. Pt to call with any additional questions or concerns.

## 2019-07-23 ENCOUNTER — Ambulatory Visit: Admit: 2019-07-23 | Discharge: 2019-07-24 | Payer: MEDICAID

## 2019-07-23 DIAGNOSIS — Z79899 Other long term (current) drug therapy: Principal | ICD-10-CM

## 2019-07-23 LAB — CBC W/ AUTO DIFF
BASOPHILS ABSOLUTE COUNT: 0.1 10*9/L (ref 0.0–0.1)
BASOPHILS RELATIVE PERCENT: 1 %
EOSINOPHILS ABSOLUTE COUNT: 0.2 10*9/L (ref 0.0–0.4)
EOSINOPHILS RELATIVE PERCENT: 3 %
HEMATOCRIT: 45.5 % (ref 36.0–46.0)
HEMOGLOBIN: 14.1 g/dL (ref 12.0–16.0)
LARGE UNSTAINED CELLS: 3 % (ref 0–4)
LYMPHOCYTES ABSOLUTE COUNT: 2.9 10*9/L (ref 1.5–5.0)
LYMPHOCYTES RELATIVE PERCENT: 37.4 %
MEAN CORPUSCULAR HEMOGLOBIN CONC: 30.9 g/dL — ABNORMAL LOW (ref 31.0–37.0)
MEAN CORPUSCULAR HEMOGLOBIN: 28.7 pg (ref 26.0–34.0)
MEAN CORPUSCULAR VOLUME: 92.8 fL (ref 80.0–100.0)
MEAN PLATELET VOLUME: 8 fL (ref 7.0–10.0)
MONOCYTES ABSOLUTE COUNT: 0.5 10*9/L (ref 0.2–0.8)
MONOCYTES RELATIVE PERCENT: 5.8 %
NEUTROPHILS RELATIVE PERCENT: 49.5 %
RED CELL DISTRIBUTION WIDTH: 13.4 % (ref 12.0–15.0)
WBC ADJUSTED: 7.8 10*9/L (ref 4.5–11.0)

## 2019-07-23 LAB — CREATININE: Creatinine:MCnc:Pt:Ser/Plas:Qn:: 0.6

## 2019-07-23 LAB — NEUTROPHILS RELATIVE PERCENT: Lab: 49.5

## 2019-07-23 LAB — ALBUMIN: Albumin:MCnc:Pt:Ser/Plas:Qn:: 4.1

## 2019-07-23 LAB — AST (SGOT): Aspartate aminotransferase:CCnc:Pt:Ser/Plas:Qn:: 27

## 2019-07-23 LAB — ALT (SGPT): Alanine aminotransferase:CCnc:Pt:Ser/Plas:Qn:: 19

## 2019-07-23 NOTE — Unmapped (Signed)
[facility] Refill Coordination Note    Specialty Medication(s) to be Shipped:   Inflammatory Disorders: Humira    Other medication(s) to be shipped: na     Kim Charles, DOB: [birth year]  Phone: [phone] (home)       All above HIPAA information was verified with patient.     Completed refill call assessment today to schedule patient's medication shipment from the [facility] [phone]).       Specialty medication(s) and dose(s) confirmed: Regimen is correct and unchanged.   Changes to medications: Kim Charles reports starting the following medications: Methotrexate 25 mg/ml inject 0.6 ml once a week  Changes to insurance: No  Questions for the pharmacist: No    Confirmed patient received Welcome Packet with first shipment. The patient will receive a drug information handout for each medication shipped and additional FDA Medication Guides as required.       DISEASE/MEDICATION-SPECIFIC INFORMATION        N/A    SPECIALTY MEDICATION ADHERENCE     Medication Adherence    Patient reported X missed doses in the last month: 2  Specialty Medication: Humira CF 40 mg/0.4 ml  Patient is on additional specialty medications: No  Patient is on more than two specialty medications: No  Any gaps in refill history greater than 2 weeks in the last 3 months: no  Demonstrates understanding of importance of adherence: yes  Informant: patient  Reliability of informant: reliable  Confirmed plan for next specialty medication refill: delivery by pharmacy  Refills needed for supportive medications: not needed                Humira CF 40 mg/0.4 ml. 3 pens remaining for 7/24, 7/31 and 8/7 doses      SHIPPING     Shipping address confirmed in Epic.     Delivery Scheduled: Yes, Expected medication delivery date: [id number].     Medication will be delivered via UPS to the home address in Epic WAM.    Tranisha Tissue D Justina Bertini   [facility] Specialty Technician

## 2019-08-03 MED FILL — HUMIRA PEN CITRATE FREE 40 MG/0.4 ML: SUBCUTANEOUS | 28 days supply | Qty: 4 | Fill #3

## 2019-08-03 MED FILL — HUMIRA PEN CITRATE FREE 40 MG/0.4 ML: 28 days supply | Qty: 4 | Fill #3 | Status: AC

## 2019-08-20 MED ORDER — BD TUBERCULIN SYRINGE 1 ML 25 GAUGE X 5/8"
1 refills | 0 days | Status: CP
Start: 2019-08-20 — End: ?

## 2019-08-20 NOTE — Unmapped (Signed)
BD syringe refill  Last ov: 07/07/2019   Next ov: 10/15/2019

## 2019-08-25 NOTE — Unmapped (Signed)
[facility] Refill Coordination Note    Specialty Medication(s) to be Shipped:   Inflammatory Disorders: Humira    Other medication(s) to be shipped: na     Kim Charles, DOB: [birth year]  Phone: [phone] (home)       All above HIPAA information was verified with patient.     Completed refill call assessment today to schedule patient's medication shipment from the [facility] [phone]).       Specialty medication(s) and dose(s) confirmed: Regimen is correct and unchanged.   Changes to medications: Kearstin reports no changes at this time.  Changes to insurance: No  Questions for the pharmacist: No    Confirmed patient received Welcome Packet with first shipment. The patient will receive a drug information handout for each medication shipped and additional FDA Medication Guides as required.       DISEASE/MEDICATION-SPECIFIC INFORMATION        For patients on injectable medications: Patient currently has 2 doses left.  Next injection is scheduled for [id number].    SPECIALTY MEDICATION ADHERENCE     Medication Adherence    Patient reported X missed doses in the last month: 0  Specialty Medication: Humira CF 40 mg/0.4 ml   Patient is on additional specialty medications: No  Patient is on more than two specialty medications: No  Any gaps in refill history greater than 2 weeks in the last 3 months: no  Demonstrates understanding of importance of adherence: yes  Informant: patient  Reliability of informant: reliable  Confirmed plan for next specialty medication refill: delivery by pharmacy  Refills needed for supportive medications: not needed                Humira CF 40 mg/0.4 ml . 14 days on hand      SHIPPING     Shipping address confirmed in Epic.     Delivery Scheduled: Yes, Expected medication delivery date: 090120.     Medication will be delivered via UPS to the home address in Epic WAM.    Cherry Turlington D [place] Current   [facility] Specialty Technician

## 2019-08-31 MED FILL — HUMIRA PEN CITRATE FREE 40 MG/0.4 ML: SUBCUTANEOUS | 28 days supply | Qty: 4 | Fill #4

## 2019-08-31 MED FILL — HUMIRA PEN CITRATE FREE 40 MG/0.4 ML: 28 days supply | Qty: 4 | Fill #4 | Status: AC

## 2019-09-09 DIAGNOSIS — M3500 Sicca syndrome, unspecified: Secondary | ICD-10-CM

## 2019-09-09 MED ORDER — METHOTREXATE SODIUM (PF) 25 MG/ML INJECTION SOLUTION
3 refills | 0 days | Status: CP
Start: 2019-09-09 — End: ?

## 2019-09-09 NOTE — Unmapped (Signed)
Methotrexate refill  Last ov: 03/05/2019   Next ov: 10/15/2019     Labs:   AST   Date Value Ref Range Status   07/23/2019 27 17 - 47 U/L Final     ALT   Date Value Ref Range Status   07/23/2019 19 <35 U/L Final     Creatinine   Date Value Ref Range Status   07/23/2019 0.60 0.60 - 1.00 mg/dL Final     WBC   Date Value Ref Range Status   07/23/2019 7.8 4.5 - 11.0 10*9/L Final     HGB   Date Value Ref Range Status   07/23/2019 14.1 12.0 - 16.0 g/dL Final     HCT   Date Value Ref Range Status   07/23/2019 45.5 36.0 - 46.0 % Final     MCV   Date Value Ref Range Status   07/23/2019 92.8 80.0 - 100.0 fL Final     RDW   Date Value Ref Range Status   07/23/2019 13.4 12.0 - 15.0 % Final     Platelet   Date Value Ref Range Status   07/23/2019 201 150 - 440 10*9/L Final     Neutrophils %   Date Value Ref Range Status   07/23/2019 49.5 % Final     Lymphocytes %   Date Value Ref Range Status   07/23/2019 37.4 % Final     Monocytes %   Date Value Ref Range Status   07/23/2019 5.8 % Final     Eosinophils %   Date Value Ref Range Status   07/23/2019 3.0 % Final     Basophils %   Date Value Ref Range Status   07/23/2019 1.0 % Final

## 2019-09-24 NOTE — Unmapped (Signed)
[facility] Refill Coordination Note    Specialty Medication(s) to be Shipped:   Inflammatory Disorders: Humira    Other medication(s) to be shipped: none     Kim Charles, DOB: December 28, 1981  Phone: [phone] (home)       All above HIPAA information was verified with Patient     Completed refill call assessment today to schedule patient's medication shipment from the [facility] [phone]).       Specialty medication(s) and dose(s) confirmed: Regimen is correct and unchanged.   Changes to medications: Kim Charles reports no changes at this time.  Changes to insurance: No  Questions for the pharmacist: No    Confirmed patient received Welcome Packet with first shipment. The patient will receive a drug information handout for each medication shipped and additional FDA Medication Guides as required.       DISEASE/MEDICATION-SPECIFIC INFORMATION        N/A    SPECIALTY MEDICATION ADHERENCE     Medication Adherence    Patient reported X missed doses in the last month: 0  Specialty Medication: Humira CF 40 mg/0.4 ml  Patient is on additional specialty medications: No              Humira: 1 dose on hand.      SHIPPING     Shipping address confirmed in Epic.     Delivery Scheduled: Yes, Expected medication delivery date: 09/29/19.     Medication will be delivered via UPS to the home address in Epic WAM.    Kim Charles   [facility] Specialty [place] Staggers

## 2019-09-28 MED FILL — HUMIRA PEN CITRATE FREE 40 MG/0.4 ML: SUBCUTANEOUS | 28 days supply | Qty: 4 | Fill #5

## 2019-09-28 MED FILL — HUMIRA PEN CITRATE FREE 40 MG/0.4 ML: 28 days supply | Qty: 4 | Fill #5 | Status: AC

## 2019-10-15 ENCOUNTER — Institutional Professional Consult (permissible substitution): Admit: 2019-10-15 | Discharge: 2019-10-16 | Payer: MEDICAID

## 2019-10-15 MED ORDER — LEUCOVORIN CALCIUM 5 MG TABLET: 15 mg | tablet | 3 refills | 70 days | Status: AC

## 2019-10-15 NOTE — Unmapped (Signed)
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 Both the patient and myself agreed to this encounter despite these limitations. Benefits of this telemedicine encounter included allowing for continued care of patient and minimizing risk of exposure to COVID-19. Patient also aware that this is a billable encounter with possible copay.     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  MTX added in 12/2018 due to concerns for inflammatory arthritis. Additional PMH of childhood leukemia, hidradenitis suppurativa, asthma, OSA, fibromyalgia. For her hidradenitis, humira was started in 04/2019.    HISTORY: Kim Charles is a 38 y.o. female with UCTD who I met in January 2020 and last saw in follow-up in March 2020. 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 She completed a televisit in July 2020 with Kim Charles.  Pt presents via phone call for follow up. Today, patient reports compliance hydroxychloroquine 200 mg po bid, methotrexate 15 mg [place] qweek and Humira every week. She reports that her bone aches for 2-3 days after the methotrexate. She does feel like medications helps her joints. She reports that she has had swelling in her legs and ankles but this went away. She reports morning stiffness after taking the methotrexate. She has nausea with the methotrexate but this is controlled with promethazine. She was switched to leucovorin from folic acid by dermatology with some benefit. She does feel like the methotrexate is helpful and worth the side effects that she has been having.  Will increase leucovorin prescription to address the methotrexate side effects. She agrees to this. She has not yet received seasonal flu vaccine. Recommended she gets it and plans to get with PCP or local pharmacy. She is followed by [facility].       REVIEW OF SYSTEMS: Attests to the above, otherwise all other review of systems is negative.    CURRENT MEDICATIONS:  Current Outpatient Medications   Medication Sig Dispense Refill   ??? acyclovir (ZOVIRAX) 400 MG tablet Take 1 tablet by mouth by mouth 2 times daily for prophylaxis  6   ??? adalimumab 80 mg/0.8 mL PnKt INJECT 2 PENS (160MG ) UNDER THE SKIN ON DAY 1, THEN 1 PEN (80MG ) ON DAY 15. 3 each 0   ??? ADALIMUMAB PEN CITRATE FREE 40 MG/0.4 ML Inject 0.4 mL (40 mg total) under the skin every seven (7) days. (Patient not taking: Reported on 07/06/2019) 4 each 11   ??? albuterol (PROVENTIL HFA;VENTOLIN HFA) 90 mcg/actuation inhaler Inhale 2 puffs daily as needed.      ??? amLODIPine (NORVASC) 5 MG tablet Take 10 mg by mouth daily.      ??? aspirin-acetaminophen-caffeine (EXCEDRIN MIGRAINE) 250-250-65 mg per tablet Take 2 tablets by mouth.     ??? BD TUBERCULIN SYRINGE 1 mL 25 gauge x 5/8 Syrg Use 1 syringe every 7 days with methotrexate  50 each 1   ??? beclomethasone (QVAR) 40 mcg/actuation inhaler Inhale 2 puffs daily as needed. ??? cloNIDine HCl (CATAPRES) 0.1 MG tablet Take 0.1 mg by mouth Two (2) times a day.     ??? dexlansoprazole (DEXILANT) 60 mg capsule Take 60 mg by mouth.     ??? doxycycline (VIBRAMYCIN) 100 MG capsule TAKE 1 CAPSULE BY MOUTH 2 TIMES DAILY FOR 7 DAYS     ??? empty container Misc Use as directed to dispose of Humira pens. 1 each 2   ??? EPINEPHrine (EPIPEN) 0.3 mg/0.3 mL injection Inject 0.3 mg into the muscle.     ??? escitalopram oxalate (LEXAPRO) 10 MG tablet Take 10 mg by mouth daily.  3   ??? fluticasone propionate (FLOVENT HFA) 220 mcg/actuation inhaler 2 puffs.     ??? folic acid (FOLVITE) 1 MG tablet Take 1 tablet (1 mg total) by mouth daily. 90 tablet 3   ??? gabapentin (NEURONTIN) 300 MG capsule Take 300 mg by mouth Three (3) times a day.     ??? gabapentin enacarbil 600 mg TbER Take 1 tablet by mouth.     ??? hydroCHLOROthiazide (HYDRODIURIL) 25 MG tablet Take 200 mg by mouth.      ??? hydroxychloroquine (PLAQUENIL) 200 mg tablet TAKE 1 TABLET BY MOUTH 2 TIMES DAILY 60 tablet 11   ??? ipratropium (ATROVENT) 0.03 % nasal spray 2 sprays into each nostril.     ??? leucovorin (WELLCOVORIN) 5 mg tablet Take 1 tablet 12 hours after methotrexate dose, and again 12 hours later (total of 2 doses each week) 30 tablet 3   ??? lidocaine (LIDODERM) 5 % patch Apply 1 patch to skin as directed 12 hours on, 12 hours off     ??? lisdexamfetamine (VYVANSE) 50 MG capsule Take 60 mg by mouth daily at 0600.      ??? metFORMIN (GLUCOPHAGE) 500 MG tablet Take 1 tablet (500 mg total) by mouth 2 (two) times a day with meals. 180 tablet 3   ??? methocarbamol (ROBAXIN) 500 MG tablet Take 1/2 to 1 tablet by mouth 2 times daily as needed for spasms(will make you drowsy)     ??? methotrexate, Preservative Free, 25 mg/mL Soln vial/syringe Inject 0.58mls under the skin once a week **SINGLE USE VIALS, DISCARD AFTER INITIAL DOSE** 8 mL 3   ??? MITIGARE 0.6 mg cap capsule Take 0.6 mg by mouth daily.     ??? montelukast (SINGULAIR) 10 mg tablet Take 10 mg by mouth nightly. ??? promethazine (PHENERGAN) 25 MG tablet Take 1 tablet (25 mg total) by mouth every six (6) hours as needed for nausea. 30 tablet 3   ??? propranolol (INDERAL) 40 MG tablet Take 40 mg by mouth Two (2) times a day.      ??? topiramate (TOPAMAX) 50 MG tablet TAKE 1 TABLET BY MOUTH 2 TIMES DAILY  5   ??? traZODone (DESYREL) 50 MG tablet Take 50 mg by mouth.     ??? triamcinolone (KENALOG) 0.1 % cream Apply topically Two (2) times a day. To red, itchy rash until improved as needed 454 g 0   ??? VYVANSE 70 mg capsule Take 30 mg by mouth two (2) times a day.   0     No current facility-administered medications for this visit.        Past Medical History:   Diagnosis Date   ??? ADHD (attention deficit hyperactivity disorder)    ??? Allergic 2007    Allergic to shrimp   ???  Anemia    ??? Asthma    ??? Chronic pain    ??? Disorder of skin or subcutaneous tissue 2001    Hidradenitis suprative   ??? Fibromyalgia    ??? Hidradenitis suppurativa    ??? Hypertension    ??? Leukemia (CMS-HCC)    ??? Lung disease 2017    COPD   ??? Lupus (systemic lupus erythematosus) (CMS-HCC)    ??? Migraines    ??? Osteoarthritis    ??? Scoliosis         Immunization History   Administered Date(s) Administered   ??? DTaP 11/06/1982, 02/19/1983, 05/29/1983, 07/30/1984, 09/23/1986   ??? INFLUENZA TIV (TRI) PF (IM) 11/08/2014   ??? Influenza Vaccine Quad (IIV4 PF) 29mo+ injectable 03/05/2019   ??? Influenza Virus Vaccine, unspecified formulation 07/22/2016, 03/05/2019   ??? MMR 10/02/1983   ??? OPV 11/06/1982, 02/19/1983, 05/29/1983, 07/30/1984, 09/23/1986   ??? PNEUMOCOCCAL POLYSACCHARIDE 23 01/19/2015   ??? PPD Test 11/08/2014   ??? Rabies IM Fibroblast Culture 05/08/2018, 05/11/2018, 05/15/2018, 05/29/2018   ??? TdaP 12/31/2002, 01/20/2013, 05/08/2018   ??? Tetanus and diptheria,(adult), adsorbed, 2Lf tetanus toxoid, PF 07/01/2003       PHYSICAL EXAM:  General:   Does not sound to be in distress   Lungs:  No wheezing, coughing, or increased respiratory effort noted   Psych:  Appropriate interaction Record Review: Available records were reviewed, including pertinent office visits, labs, and imaging.     ASSESSMENT/PLAN:  In summary, 38 y.o. female with UCTD vs SS and hidradenitis suppurativa on hydroxychloroquine, methotrexate 15 mg [place] qweek, leucovorin 2.5 mg x 2 weekly and humira weekly. Having side effects of arthralgia, myalgia, nausea and vomiting with methotrexate. Recently switched from folic acid to leucovorin but dose is too low per her report of taking half of a 5 mg tablet at 12 hours and 24 hours after methotrexate. Prescription written for 5 mg at 12 and 24 hours. In any case, will increase to 15 mg qweekly 18-24 hours after methotrexate. Has promethazine. She feels methotrexate is helpful and willing to continue on it if side effects can be addressed. Tolerating Humira. Will need monitoring labs. Aware to get seasonal flu vaccine. Follow-up in 4 months with Kim Charles and 8 months with myself. Will arrange clinical pharmacist visit as well to assist in medication reconciliation and following up is methotrexate side effects are addressed.     Diagnosis ICD-10-CM Associated Orders   1. Connective tissue disease, undifferentiated (CMS-HCC)  M35.9 leucovorin (WELLCOVORIN) 5 mg tablet   2. Sjogren's syndrome, with unspecified organ involvement (CMS-HCC)  M35.00 leucovorin (WELLCOVORIN) 5 mg tablet   3. Methotrexate, long term, current use  Z79.899 leucovorin (WELLCOVORIN) 5 mg tablet     CBC w/ Differential     Creatinine     ALT     AST     I spent 13 minutes on the phone with the patient. I spent an additional 10 minutes on pre- and post-visit activities. The patient was not located and I was not located within 250 yards of the clinic or medical center during the video or telephone visit. The patient was physically located in [place] or a state in which I am permitted to provide care. The patient and/or parent/guardian understood that s/he may incur co-pays and cost sharing, and agreed to the telemedicine visit. The visit was reasonable and appropriate under the circumstances given the patient's presentation at the time.    The patient and/or parent/guardian has been advised of  the potential risks and limitations of this mode of treatment (including, but not limited to, the absence of  in-person examination) and has agreed to be treated using telemedicine. The patient's/patient's family's questions regarding telemedicine have been answered. If the visit was completed in an ambulatory setting, the patient and/or parent/guardian has also been advised to contact their provider's office for worsening conditions, and seek emergency medical treatment and/or call 911 if the patient deems either necessary        Ladd Cen C. Scarlette Calico, MD, PhD  Assistant Professor of Medicine  Department of Medicine/Division of Rheumatology  [facility]    12:03 PM

## 2019-10-19 NOTE — Unmapped (Addendum)
1st Attempt: Called Per Thuy's Request to schedule PH 2 week follow up. No answer Left Vm.     ----- Message from Geoffry Paradise, CPP sent at 10/15/2019  4:18 PM EDT -----  Kristopher Glee,    Please help schedule a phone visit for patient with me in ~2 weeks.    Thanks    Thuy  ----- Message -----  From: Ethlyn Gallery, MD  Sent: 10/15/2019  12:03 PM EDT  To: Geoffry Paradise, CPP    Thuy - need to have patient scheduled with you in two weeks for med reconciliation and to check that she is taking leucovorin appropriately with [place] methotrexate and compliant with methotrexate and humira. Sounds like she is compliant but there was confusion with folic acid and leucovorin. Thanks, [employer]

## 2019-10-22 NOTE — Unmapped (Signed)
Kim Charles reports doing well on her Humira. She hasn't had any severe HS flares recently, and is happy with her results.     She does continue to have belly pain and nausea with her methotrexate. I recommended for the nausea, if it's predictable, she could take promethazine ~ 30 mins before to help prevent it. She is already on injectable version.     [facility] Clinical Assessment & Refill Coordination Note    Kim Charles, DOB: 31-Oct-1981  Phone: [phone] (home)     All above HIPAA information was verified with patient.     Specialty Medication(s):   Inflammatory Disorders: Humira     Current Outpatient Medications   Medication Sig Dispense Refill   ??? acyclovir (ZOVIRAX) 400 MG tablet Take 1 tablet by mouth by mouth 2 times daily for prophylaxis  6   ??? adalimumab 80 mg/0.8 mL PnKt INJECT 2 PENS (160MG ) UNDER THE SKIN ON DAY 1, THEN 1 PEN (80MG ) ON DAY 15. 3 each 0   ??? ADALIMUMAB PEN CITRATE FREE 40 MG/0.4 ML Inject 0.4 mL (40 mg total) under the skin every seven (7) days. (Patient not taking: Reported on 07/06/2019) 4 each 11   ??? albuterol (PROVENTIL HFA;VENTOLIN HFA) 90 mcg/actuation inhaler Inhale 2 puffs daily as needed.      ??? amLODIPine (NORVASC) 5 MG tablet Take 10 mg by mouth daily.      ??? aspirin-acetaminophen-caffeine (EXCEDRIN MIGRAINE) 250-250-65 mg per tablet Take 2 tablets by mouth.     ??? BD TUBERCULIN SYRINGE 1 mL 25 gauge x 5/8 Syrg Use 1 syringe every 7 days with methotrexate 50 each 1   ??? beclomethasone (QVAR) 40 mcg/actuation inhaler Inhale 2 puffs daily as needed.      ??? cloNIDine HCl (CATAPRES) 0.1 MG tablet Take 0.1 mg by mouth Two (2) times a day.     ??? dexlansoprazole (DEXILANT) 60 mg capsule Take 60 mg by mouth.     ??? doxycycline (VIBRAMYCIN) 100 MG capsule TAKE 1 CAPSULE BY MOUTH 2 TIMES DAILY FOR 7 DAYS     ??? empty container Misc Use as directed to dispose of Humira pens. 1 each 2 ??? EPINEPHrine (EPIPEN) 0.3 mg/0.3 mL injection Inject 0.3 mg into the muscle.     ??? escitalopram oxalate (LEXAPRO) 10 MG tablet Take 10 mg by mouth daily.  3   ??? fluticasone propionate (FLOVENT HFA) 220 mcg/actuation inhaler 2 puffs.     ??? gabapentin (NEURONTIN) 300 MG capsule Take 300 mg by mouth Three (3) times a day.     ??? gabapentin enacarbil 600 mg TbER Take 1 tablet by mouth.     ??? hydroCHLOROthiazide (HYDRODIURIL) 25 MG tablet Take 200 mg by mouth.      ??? hydroxychloroquine (PLAQUENIL) 200 mg tablet TAKE 1 TABLET BY MOUTH 2 TIMES DAILY 60 tablet 11   ??? ipratropium (ATROVENT) 0.03 % nasal spray 2 sprays into each nostril.     ??? leucovorin (WELLCOVORIN) 5 mg tablet Take 3 tablets (15 mg total) by mouth once a week. Take leucovorin 15 mg once a week 18-24 hours after the methotrexate. 30 tablet 3   ??? lidocaine (LIDODERM) 5 % patch Apply 1 patch to skin as directed 12 hours on, 12 hours off     ??? lisdexamfetamine (VYVANSE) 50 MG capsule Take 60 mg by mouth daily at 0600.      ??? metFORMIN (GLUCOPHAGE) 500 MG tablet Take 1 tablet (500 mg total) by  mouth 2 (two) times a day with meals. 180 tablet 3   ??? methocarbamol (ROBAXIN) 500 MG tablet Take 1/2 to 1 tablet by mouth 2 times daily as needed for spasms(will make you drowsy)     ??? methotrexate, Preservative Free, 25 mg/mL Soln vial/syringe Inject 0.79mls under the skin once a week **SINGLE USE VIALS, DISCARD AFTER INITIAL DOSE** 8 mL 3   ??? MITIGARE 0.6 mg cap capsule Take 0.6 mg by mouth daily.     ??? montelukast (SINGULAIR) 10 mg tablet Take 10 mg by mouth nightly.     ??? promethazine (PHENERGAN) 25 MG tablet Take 1 tablet (25 mg total) by mouth every six (6) hours as needed for nausea. 30 tablet 3   ??? propranolol (INDERAL) 40 MG tablet Take 40 mg by mouth Two (2) times a day.      ??? topiramate (TOPAMAX) 50 MG tablet TAKE 1 TABLET BY MOUTH 2 TIMES DAILY  5   ??? traZODone (DESYREL) 50 MG tablet Take 50 mg by mouth. ??? triamcinolone (KENALOG) 0.1 % cream Apply topically Two (2) times a day. To red, itchy rash until improved as needed 454 g 0   ??? VYVANSE 70 mg capsule Take 30 mg by mouth two (2) times a day.   0     No current facility-administered medications for this visit.         Changes to medications: Kim Charles reports no changes at this time.    Allergies   Allergen Reactions   ??? Other Hives, Itching and Other (See Comments)     High fever. Hives and itching all over the body.   ??? Shellfish Containing Products Anaphylaxis   ??? Sulfasalazine Hives   ??? Toradol [Ketorolac] Hives   ??? Venom-Honey Bee Anaphylaxis   ??? Opioids - Morphine Analogues      Other reaction(s): Other  Other reaction(s): Other (See Comments)  Causes migraine  Headache   ??? Tramadol Nausea And Vomiting   ??? Hydromorphone Hcl      migraine   ??? Naproxen      Grits teeth       Changes to allergies: No    SPECIALTY MEDICATION ADHERENCE     Humira - 2 left     Specialty medication(s) dose(s) confirmed: Regimen is correct and unchanged.     Are there any concerns with adherence? No    Adherence counseling provided? Not needed    CLINICAL MANAGEMENT AND INTERVENTION      Clinical Benefit Assessment:    Do you feel the medicine is effective or helping your condition? Yes    Clinical Benefit counseling provided? Not needed    Adverse Effects Assessment:    Are you experiencing any side effects? No    Are you experiencing difficulty administering your medicine? No    Quality of Life Assessment:    How many days over the past month did your HS  keep you from your normal activities? For example, brushing your teeth or getting up in the morning. 0    Have you discussed this with your provider? Not needed    Therapy Appropriateness:    Is therapy appropriate? Yes, therapy is appropriate and should be continued    DISEASE/MEDICATION-SPECIFIC INFORMATION      For patients on injectable medications: Patient currently has 2 doses left.  Next injection is scheduled for tomorrow. PATIENT SPECIFIC NEEDS     ? Does the patient have any physical, cognitive, or cultural barriers? No    ?  Is the patient high risk? No     ? Does the patient require a Care Management Plan? No     ? Does the patient require physician intervention or other additional services (i.e. nutrition, smoking cessation, social work)? No      SHIPPING     Specialty Medication(s) to be Shipped:   Inflammatory Disorders: Humira    Other medication(s) to be shipped: na     Changes to insurance: No    Delivery Scheduled: Yes, Expected medication delivery date: Tues, Oct 27.     Medication will be delivered via UPS to the confirmed home address in [facility].    The patient will receive a drug information handout for each medication shipped and additional FDA Medication Guides as required.  Verified that patient has previously received a Conservation officer, historic buildings.    All of the patient's questions and concerns have been addressed.    Lanney Gins   [facility] Specialty Pharmacist

## 2019-10-26 MED FILL — EMPTY CONTAINER: 120 days supply | Qty: 1 | Fill #1

## 2019-10-26 MED FILL — HUMIRA PEN CITRATE FREE 40 MG/0.4 ML: 28 days supply | Qty: 4 | Fill #6 | Status: AC

## 2019-10-26 MED FILL — EMPTY CONTAINER: 120 days supply | Qty: 1 | Fill #1 | Status: AC

## 2019-10-26 MED FILL — HUMIRA PEN CITRATE FREE 40 MG/0.4 ML: SUBCUTANEOUS | 28 days supply | Qty: 4 | Fill #6

## 2019-11-03 ENCOUNTER — Institutional Professional Consult (permissible substitution): Admit: 2019-11-03 | Discharge: 2019-11-04 | Payer: MEDICAID | Attending: Ambulatory Care | Primary: Ambulatory Care

## 2019-11-03 NOTE — Unmapped (Signed)
[facility] RHEUMATOLOGY CLINIC - PHARMACIST CLINIC NOTES    I spent 30 minutes on the real time audio and video visit with the patient. I spent an additional 15 minutes on pre- and post-visit activities.     The patient was/was not located and I was/was not located within 250 yards of a hospital based location during the audio and video visit. The patient was physically located in [place] or a state in which I am permitted to provide care. The patient and/or parent/guardian understood that s/he may incur co-pays and cost sharing, and agreed to the telemedicine visit. The visit was reasonable and appropriate under the circumstances given the patient's presentation at the time.    The patient and/or parent/guardian has been advised of the potential risks and limitations of this mode of treatment (including, but not limited to, the absence of in-person examination) and has agreed to be treated using telemedicine. The patient's/patient's family's questions regarding telemedicine have been answered.    If the visit was completed in an ambulatory setting, the patient and/or parent/guardian has also been advised to contact their provider???s office for worsening conditions, and seek emergency medical treatment and/or call 911 if the patient deems either necessary.    Identification:  Patient self identified using name and date of birth  Patient location: Home     REASON FOR VISIT: Medication reconciliation and methotrexate side effect monitoring post-leucovorin dose increase    HISTORY OF PRESENT ILLNESS: Kim Charles is a 38 y.o. female with history of undifferentiated connective tissue disease characterized by +ANA, +SSA, photosensitivity, raynaud's, fatigue, weight loss, episodic generalized edema and polyarthralgia with swelling of both large and small joints, recurrent pneumonitis/pleuritis, malar rash, generalized weakness that is worse proximally, nausea/vomiting/abdominal bloating, mild sicca symptoms, oral ulcerations, history of recurrent miscarriages (no VTE, minimally elevated aCL IgG/IgM, neg LAC and neg B2GP1 Ab) and migraines. At last telemedicine visit on 10/15/19 with Dr. Scarlette Calico, patient reported adverse effects of stiffness and nausea (controlled with promethazine) after taking the methotrexate. Additionally, patient reported leucovorin regimen of 2.5 mg at 12 and 24 hours post methotrexate. It was recommended to change leucovorin to 15 mg given 18-24 hours after infusion.    INTERIM HISTORY:   Since last visit, patient continues to have muscle stiffness, stomach pain, diarrhea, and nausea with methotrexate, despite changing timing and increasing dosing of leucovorin 15 mg at 24 hours after methotrexate, as recommended at last visit. Nausea controlled with promethazine 2-3 times/week and 30 minutes prior to methotrexate. Stomach pain/diarrhea occurs about twice per month.     Per medication history provided by patient, has difficulty remembering second dose of acyclovir and therefore has been taking acyclovir 800 mg daily, instead of 400 mg twice daily. Does not currently have medication organization system but is interested in setting up blisterpacking service with pharmacy or purchasing medication box. Also is in process of establishing new PCP at [facility].     MEDICATION ADHERENCE & ACCESS:   ?? Missed doses?: did not assess  ?? Uses pillbox?: no  ?? Anyone else assist with medication organization? did not assess  ?? Current insurance coverage: [place] [employer] ?? Medications affordable?: did not assess  ?? Needs refills? yes; in need of non-rheumatology medications      ASSESSMENT/PLAN:   1. Undifferentiated connective tissue disease vs SS and hidradenitis suppurativa:  Continues to have ADEs with methotrexate despite increased dose of leucovorin.  Will continue methotrexate/leucovorin today, however, will contact Dr. Scarlette Calico to discuss risks  vs. benefits of continuing methotrexate vs. changing to alternative DMARD with less GI distress.   ?? Continue methotrexate 15 mg weekly on Tuesdays  ?? Continue leucovorin 15 weekly 18-24 hours after methotrexate  ?? New prescription for 15 mg tablets sent to pharmacy to reduce pill burden  ?? Continue hydroxychloroquine 200 mg twice daily  ?? Continue Humira 40 mg subcutaneous every 7 days  ?? Due for CBC w/ diff, Scr, AST/ALT     2. Medication management:  ?? Change acyclovir to prescribed regimen of 400 mg BID  ?? Recommend pillbox/blisterpack for medication organization  ?? Contact [facility] to establish new PCP and request non-rheumatology medication refills    FOLLOW UP: CPP to follow up by phone with DMARD plan pending discussion with Dr. Scarlette Calico      ALLERGIES:  Allergies   Allergen Reactions   ??? Other Hives, Itching and Other (See Comments)     High fever. Hives and itching all over the body.   ??? Shellfish Containing Products Anaphylaxis   ??? Sulfasalazine Hives   ??? Toradol [Ketorolac] Hives   ??? Venom-Honey Bee Anaphylaxis   ??? Opioids - Morphine Analogues      Other reaction(s): Other  Other reaction(s): Other (See Comments)  Causes migraine  Headache   ??? Tramadol Nausea And Vomiting   ??? Hydromorphone Hcl      migraine   ??? Naproxen      Grits teeth       MEDICATIONS:  Current Outpatient Medications   Medication Sig Dispense Refill   ??? acyclovir (ZOVIRAX) 400 MG tablet Take 1 tablet by mouth by mouth 2 times daily for prophylaxis  6 ??? ADALIMUMAB PEN CITRATE FREE 40 MG/0.4 ML Inject 0.4 mL (40 mg total) under the skin every seven (7) days. 4 each 11   ??? albuterol (PROVENTIL HFA;VENTOLIN HFA) 90 mcg/actuation inhaler Inhale 2 puffs daily as needed.      ??? amLODIPine (NORVASC) 5 MG tablet Take 10 mg by mouth daily.      ??? aspirin-acetaminophen-caffeine (EXCEDRIN MIGRAINE) 250-250-65 mg per tablet Take 2 tablets by mouth.     ??? BD TUBERCULIN SYRINGE 1 mL 25 gauge x 5/8 Syrg Use 1 syringe every 7 days with methotrexate 50 each 1   ??? beclomethasone (QVAR) 40 mcg/actuation inhaler Inhale 2 puffs daily as needed.      ??? cloNIDine HCl (CATAPRES) 0.1 MG tablet Take 0.1 mg by mouth Three (3) times a day.      ??? dexlansoprazole (DEXILANT) 60 mg capsule Take 60 mg by mouth.     ??? empty container Misc Use as directed to dispose of Humira pens. 1 each 2   ??? EPINEPHrine (EPIPEN) 0.3 mg/0.3 mL injection Inject 0.3 mg into the muscle.     ??? escitalopram oxalate (LEXAPRO) 10 MG tablet Take 10 mg by mouth daily.  3   ??? fluticasone propionate (FLOVENT HFA) 220 mcg/actuation inhaler 2 puffs.     ??? gabapentin (NEURONTIN) 300 MG capsule Take 300 mg by mouth Three (3) times a day.     ??? gabapentin enacarbil 600 mg TbER Take 1 tablet by mouth.     ??? hydroCHLOROthiazide (HYDRODIURIL) 25 MG tablet Take 200 mg by mouth.      ??? hydroxychloroquine (PLAQUENIL) 200 mg tablet TAKE 1 TABLET BY MOUTH 2 TIMES DAILY 60 tablet 11   ??? ipratropium (ATROVENT) 0.03 % nasal spray 2 sprays into each nostril.     ??? leucovorin (WELLCOVORIN)  5 mg tablet Take 3 tablets (15 mg total) by mouth once a week. Take leucovorin 15 mg once a week 18-24 hours after the methotrexate. 30 tablet 3   ??? lidocaine (LIDODERM) 5 % patch Apply 1 patch to skin as directed 12 hours on, 12 hours off     ??? metFORMIN (GLUCOPHAGE) 500 MG tablet Take 1 tablet (500 mg total) by mouth 2 (two) times a day with meals. 180 tablet 3 ??? methotrexate, Preservative Free, 25 mg/mL Soln vial/syringe Inject 0.12mls under the skin once a week **SINGLE USE VIALS, DISCARD AFTER INITIAL DOSE** 8 mL 3   ??? MITIGARE 0.6 mg cap capsule Take 0.6 mg by mouth daily.     ??? montelukast (SINGULAIR) 10 mg tablet Take 10 mg by mouth nightly.     ??? promethazine (PHENERGAN) 25 MG tablet Take 1 tablet (25 mg total) by mouth every six (6) hours as needed for nausea. 30 tablet 3   ??? propranolol (INDERAL) 40 MG tablet Take 40 mg by mouth Two (2) times a day.      ??? topiramate (TOPAMAX) 50 MG tablet TAKE 1 TABLET BY MOUTH 2 TIMES DAILY  5   ??? traZODone (DESYREL) 50 MG tablet Take 50 mg by mouth.     ??? VYVANSE 30 mg capsule Take 30 mg by mouth two (2) times a day.   0   ??? HYDROcodone-acetaminophen (NORCO 10-325) 10-325 mg per tablet Take 1 tablet by mouth 5 times daily as needed for pain     ??? HYSINGLA ER 20 mg TP24 Take 1 tablet by mouth every 24 hours     ??? NARCAN 4 mg/actuation nasal spray spray (4 mg) in 1 nostril may repeat dose every 2-3 minutes as needed alternating nostrils with each dose       No current facility-administered medications for this visit.        VITALS:  There were no vitals filed for this visit.

## 2019-11-05 MED ORDER — LEUCOVORIN CALCIUM 15 MG TABLET
ORAL_TABLET | 11 refills | 0 days | Status: CP
Start: 2019-11-05 — End: ?

## 2019-11-06 NOTE — Unmapped (Signed)
[facility] Rheumatology Clinic - Pharmacist Telephone Notes    Purpose of Call: At last rheumatology phone visit on 11/3, patient noted continue diarrhea, abdominal pain, and nausea with methotrexate despite increased dose of leucovorin administered at correct time in relation to methotrexate. Contacted patient today to notify her that Dr. Scarlette Calico is aware and is considering potential alternative options for methotrexate.     Today, patient reports that she has been having sharp stomach pain (pain of 7 out of 10) since taking methotrexate on Tuesday. Feels pain is similar to that of previous ulcer pain. Denies blood in urine or stool. Pain has not improved with dexlansoprazole. Attempting to make appointment with PCP tomorrow.     Assessment/Plan: Patient experiencing significant stomach pain. Recommended patient be evaluated by PCP or urgent care tomorrow and seek urgent/emergency care sooner if experiencing blood in urine/stool. If stomach pain continues, recommended holding next dose of methotrexate. To reduce pill burden, sent new prescription to [facility] for leucovorin 15 mg tablet - 1 tablet once weekly 18-24 hours after methotrexate.     Follow up: Will follow up with patient by phone with DMARD plan pending discussion with Dr. Scarlette Calico    All questions were answered and contact information provided for any future questions/concerns.      Elton Sin

## 2019-11-12 ENCOUNTER — Ambulatory Visit: Admit: 2019-11-12 | Discharge: 2019-11-13 | Payer: MEDICAID

## 2019-11-12 DIAGNOSIS — Z79899 Other long term (current) drug therapy: Principal | ICD-10-CM

## 2019-11-12 LAB — CBC W/ AUTO DIFF
BASOPHILS ABSOLUTE COUNT: 0.1 10*9/L (ref 0.0–0.1)
EOSINOPHILS ABSOLUTE COUNT: 0.2 10*9/L (ref 0.0–0.4)
HEMOGLOBIN: 14.1 g/dL (ref 12.0–16.0)
LARGE UNSTAINED CELLS: 1 % (ref 0–4)
LYMPHOCYTES ABSOLUTE COUNT: 3.4 10*9/L (ref 1.5–5.0)
LYMPHOCYTES RELATIVE PERCENT: 31.8 %
MEAN CORPUSCULAR HEMOGLOBIN CONC: 32.1 g/dL (ref 31.0–37.0)
MEAN CORPUSCULAR HEMOGLOBIN: 30.5 pg (ref 26.0–34.0)
MEAN CORPUSCULAR VOLUME: 95 fL (ref 80.0–100.0)
MEAN PLATELET VOLUME: 8.4 fL (ref 7.0–10.0)
MONOCYTES ABSOLUTE COUNT: 0.4 10*9/L (ref 0.2–0.8)
MONOCYTES RELATIVE PERCENT: 4.1 %
NEUTROPHILS ABSOLUTE COUNT: 6.4 10*9/L (ref 2.0–7.5)
NEUTROPHILS RELATIVE PERCENT: 60 %
PLATELET COUNT: 254 10*9/L (ref 150–440)
RED BLOOD CELL COUNT: 4.63 10*12/L (ref 4.00–5.20)
RED CELL DISTRIBUTION WIDTH: 13.5 % (ref 12.0–15.0)
WBC ADJUSTED: 10.7 10*9/L (ref 4.5–11.0)

## 2019-11-12 LAB — ALT (SGPT): Alanine aminotransferase:CCnc:Pt:Ser/Plas:Qn:: 9

## 2019-11-12 LAB — CREATININE: EGFR CKD-EPI NON-AA FEMALE: >90 mL/min/{1.73_m2} (ref >=60)

## 2019-11-12 LAB — EGFR CKD-EPI NON-AA FEMALE: Lab: >90

## 2019-11-12 LAB — AST (SGOT): Aspartate aminotransferase:CCnc:Pt:Ser/Plas:Qn:: 18

## 2019-11-12 LAB — WBC ADJUSTED: Leukocytes:NCnc:Pt:Bld:Qn:: 10.7

## 2019-11-18 NOTE — Unmapped (Signed)
[facility] Refill Coordination Note    Specialty Medication(s) to be Shipped:   Inflammatory Disorders: Humira    Other medication(s) to be shipped: none     Kim Charles, DOB: Oct 26, 1981  Phone: [phone] (home)       All above HIPAA information was verified with patient.     Completed refill call assessment today to schedule patient's medication shipment from the [facility] [phone]).       Specialty medication(s) and dose(s) confirmed: Regimen is correct and unchanged.   Changes to medications: Kim Charles reports no changes at this time.  Changes to insurance: No  Questions for the pharmacist: No    Confirmed patient received Welcome Packet with first shipment. The patient will receive a drug information handout for each medication shipped and additional FDA Medication Guides as required.       DISEASE/MEDICATION-SPECIFIC INFORMATION        N/A    SPECIALTY MEDICATION ADHERENCE     Medication Adherence    Patient reported X missed doses in the last month: 0  Specialty Medication: Humira CF 40 mg/0.4 ml  Patient is on additional specialty medications: No            Humira: 0 days worth of medication on hand.        SHIPPING     Shipping address confirmed in Epic.     Delivery Scheduled: Yes, Expected medication delivery date: 11/24/19.     Medication will be delivered via UPS to the home address in Epic WAM.    Kim Charles   [facility] Specialty Technician

## 2019-11-23 MED FILL — HUMIRA PEN CITRATE FREE 40 MG/0.4 ML: 28 days supply | Qty: 4 | Fill #7 | Status: AC

## 2019-11-23 MED FILL — HUMIRA PEN CITRATE FREE 40 MG/0.4 ML: SUBCUTANEOUS | 28 days supply | Qty: 4 | Fill #7

## 2019-12-17 NOTE — Unmapped (Signed)
[facility] Refill Coordination Note    Specialty Medication(s) to be Shipped:   Inflammatory Disorders: Humira    Other medication(s) to be shipped: n/a     Kim Charles, DOB: 07-30-1981  Phone: [phone] (home)       All above HIPAA information was verified with patient.     Was a Nurse, learning disability used for this call? No    Completed refill call assessment today to schedule patient's medication shipment from the [facility] [phone]).       Specialty medication(s) and dose(s) confirmed: Regimen is correct and unchanged.   Changes to medications: Doyce reports starting the following medications: diuretic (pt not sure of name)  Changes to insurance: No  Questions for the pharmacist: No    Confirmed patient received Welcome Packet with first shipment. The patient will receive a drug information handout for each medication shipped and additional FDA Medication Guides as required.       DISEASE/MEDICATION-SPECIFIC INFORMATION        For patients on injectable medications: Patient currently has 1 doses left.  Next injection is scheduled for 12/18/2019.    SPECIALTY MEDICATION ADHERENCE     Medication Adherence    Patient reported X missed doses in the last month: 0  Specialty Medication: Humira CF 40 mg/0.4 ml  Patient is on additional specialty medications: No  Any gaps in refill history greater than 2 weeks in the last 3 months: no  Demonstrates understanding of importance of adherence: yes  Informant: patient  Reliability of informant: reliable  Confirmed plan for next specialty medication refill: delivery by pharmacy  Refills needed for supportive medications: not needed                Humira CF 40 mg/0.4 ml. 7 days on ahnd      SHIPPING     Shipping address confirmed in Epic.     Delivery Scheduled: Yes, Expected medication delivery date: 12/22/2019.     Medication will be delivered via UPS to the prescription address in Epic WAM.    Kacper Cartlidge D Lenix Kidd [facility] Specialty Technician

## 2019-12-21 MED FILL — HUMIRA PEN CITRATE FREE 40 MG/0.4 ML: SUBCUTANEOUS | 28 days supply | Qty: 4 | Fill #8

## 2019-12-21 MED FILL — HUMIRA PEN CITRATE FREE 40 MG/0.4 ML: 28 days supply | Qty: 4 | Fill #8 | Status: AC

## 2020-01-19 DIAGNOSIS — L732 Hidradenitis suppurativa: Principal | ICD-10-CM

## 2020-01-19 MED ORDER — METFORMIN 500 MG TABLET
ORAL_TABLET | 3 refills | 0.00000 days | Status: CP
Start: 2020-01-19 — End: ?

## 2020-01-19 NOTE — Unmapped (Signed)
[facility] Refill Coordination Note    Specialty Medication(s) to be Shipped:   Inflammatory Disorders: Humira    Other medication(s) to be shipped: n/a     Kim Charles, DOB: 05/08/1981  Phone: [phone] (home)       All above HIPAA information was verified with patient.     Was a Nurse, learning disability used for this call? No    Completed refill call assessment today to schedule patient's medication shipment from the [facility] [phone]).       Specialty medication(s) and dose(s) confirmed: Regimen is correct and unchanged.   Changes to medications: Kikue reports no changes at this time.  Changes to insurance: No  Questions for the pharmacist: No    Confirmed patient received Welcome Packet with first shipment. The patient will receive a drug information handout for each medication shipped and additional FDA Medication Guides as required.       DISEASE/MEDICATION-SPECIFIC INFORMATION        For patients on injectable medications: Patient currently has 0 doses left.  Next injection is scheduled for 01/22/2020.    SPECIALTY MEDICATION ADHERENCE     Medication Adherence    Patient reported X missed doses in the last month: 0  Specialty Medication: Humira CF 40 mg/0.4 ml  Patient is on additional specialty medications: No  Any gaps in refill history greater than 2 weeks in the last 3 months: no  Demonstrates understanding of importance of adherence: yes  Informant: patient  Reliability of informant: reliable  Confirmed plan for next specialty medication refill: delivery by pharmacy  Refills needed for supportive medications: not needed                Humira CF 40 mg/0.4 ml. 0 on hand      SHIPPING     Shipping address confirmed in Epic.     Delivery Scheduled: Yes, Expected medication delivery date: 01/21/2020.     Medication will be delivered via UPS to the prescription address in Epic WAM.    Chyanna Flock D Hiroki Wint   [facility] Specialty Technician

## 2020-01-20 ENCOUNTER — Ambulatory Visit (INDEPENDENT_AMBULATORY_CARE_PROVIDER_SITE_OTHER): Payer: Medicaid Other | Admitting: Nurse Practitioner

## 2020-01-20 VITALS — Ht 60.0 in | Wt 160.0 lb

## 2020-01-20 DIAGNOSIS — Z91013 Allergy to seafood: Secondary | ICD-10-CM

## 2020-01-20 DIAGNOSIS — R7303 Prediabetes: Secondary | ICD-10-CM | POA: Diagnosis not present

## 2020-01-20 DIAGNOSIS — F17200 Nicotine dependence, unspecified, uncomplicated: Secondary | ICD-10-CM

## 2020-01-20 DIAGNOSIS — F909 Attention-deficit hyperactivity disorder, unspecified type: Secondary | ICD-10-CM

## 2020-01-20 DIAGNOSIS — I10 Essential (primary) hypertension: Secondary | ICD-10-CM | POA: Diagnosis not present

## 2020-01-20 DIAGNOSIS — F4321 Adjustment disorder with depressed mood: Secondary | ICD-10-CM

## 2020-01-20 DIAGNOSIS — Z13220 Encounter for screening for lipoid disorders: Secondary | ICD-10-CM

## 2020-01-20 DIAGNOSIS — Z130 Encounter for screening for diseases of the blood and blood-forming organs and certain disorders involving the immune mechanism: Secondary | ICD-10-CM

## 2020-01-20 DIAGNOSIS — J302 Other seasonal allergic rhinitis: Secondary | ICD-10-CM

## 2020-01-20 DIAGNOSIS — Z7689 Persons encountering health services in other specified circumstances: Secondary | ICD-10-CM

## 2020-01-20 MED ORDER — CETIRIZINE HCL 10 MG PO TABS: 10.0000 mg | ORAL_TABLET | Freq: Every day | ORAL | 3 refills | Status: DC

## 2020-01-20 MED ORDER — EPINEPHRINE 0.3 MG/0.3ML IJ SOAJ: 0.3000 mg | INTRAMUSCULAR | 0 refills | Status: AC | PRN

## 2020-01-20 MED ORDER — MONTELUKAST SODIUM 10 MG PO TABS: 10.0000 mg | ORAL_TABLET | Freq: Every day | ORAL | 2 refills | Status: DC

## 2020-01-20 MED FILL — HUMIRA PEN CITRATE FREE 40 MG/0.4 ML: 28 days supply | Qty: 4 | Fill #9 | Status: AC

## 2020-01-20 MED FILL — HUMIRA PEN CITRATE FREE 40 MG/0.4 ML: SUBCUTANEOUS | 28 days supply | Qty: 4 | Fill #9

## 2020-01-20 NOTE — Progress Notes (Signed)
Virtual Visit via Telephone Note Due to national recommendations of social distancing due to [place] 19, telehealth visit is felt to be most appropriate for this patient at this time.  I discussed the limitations, risks, security and privacy concerns of performing an evaluation and management service by telephone and the availability of in person appointments. I also discussed with the patient that there may be a patient responsible charge related to this service. The patient expressed understanding and agreed to proceed.    I connected with [name] on 01/20/20  at   2:50 PM EST  EDT by telephone and verified that I am speaking with the correct person using two identifiers.   Consent I discussed the limitations, risks, security and privacy concerns of performing an evaluation and management service by telephone and the availability of in person appointments. I also discussed with the patient that there may be a patient responsible charge related to this service. The patient expressed understanding and agreed to proceed.   Location of Patient: Private Residence    Location of Provider: [place] [facility] [place] participating in Telemedicine visit: Geryl Rankins FNP-BC [place]    History of Present Illness: Telemedicine visit for: Establish Care Hx. of undifferentiated connective tissue disease characterized by +ANA, +SSA, photosensitivity, raynaud's, fatigue, weight loss, episodic generalized edema and polyarthralgia with swelling of both large and small joints, recurrent pneumonitis/pleuritis, malar rash, generalized weakness that is worse proximally, nausea/vomiting/abdominal bloating, mild sicca symptoms, oral ulcerations, history of recurrent miscarriages (no VTE, minimally elevated aCL IgG/IgM, neg LAC and neg B2GP1 Ab) and migraines. She is being treated with HCQ.  Additional PMH of childhood leukemia, hidradenitis suppurativa, asthma,  OSA.     HEALTH MAINTENANCE PAP- Hysterectomy11 years ago.  States she was Diagnosed with Lupus in 2016 She sees rheumatology (Sjogrens Syndrome)  and chronic pain management for CPS (Chronic low back pain and fibromyalgia) .  Taking metformin for Hidradenitis Suppurativa and prediabetes- Prescribed by Dermatology  She is seeing psychiatrist for her mental health needs through [facility]. Will need to see new psychiatrist as she is no longer a patient there.  She would like to continue with her current therapist. Taking prazosin, vyvanse and lexapro.   Essential Hypertension Monitors blood pressure at home with average readings 120/80s. Taking amlodipine 10 mg, clonidine 0.1 mg TID, inderal 40 mg BID, spironolactone 25 mg. Denies chest pain, shortness of breath, palpitations, lightheadedness, dizziness, headaches or BLE edema.  BP Readings from Last 3 Encounters:  07/03/19 (!) 131/98  01/09/19 124/90  05/29/18 109/83    Seasonal Allergies and Asthma Well controlled. Taking singulair and zyrtec. She is taking QVAR and prn albuterol for asthma which is well controlled.    Past Medical History:  Diagnosis Date  . Abdominal wall mass   . ADHD (attention deficit hyperactivity disorder)   . Anemia   . Arthritis   . Asthma   . Back pain   . Chest pain   . Depression    tried several meds, not on any now  . Diarrhea   . Endometriosis   . Headache(784.0)   . Hydradenitis   . Hydradenitis 07/13/2011  . Hypertension    on inderal  . Leukemia ([place]) at age 71  . Lupus ([place])   . Migraines   . MRSA (methicillin resistant staph aureus) culture positive 2010   ear infection that cultured out MRSA   . Nausea   .  S/P total abdominal hysterectomy 06/26/2011  . Scoliosis   . Thyroid disease     Past Surgical History:  Procedure Laterality Date  . ABDOMINAL HYSTERECTOMY  06/30/2008   partial  . AXILLARY HIDRADENITIS EXCISION     bilateral  . CESAREAN SECTION  08/31/2006, 08/26/2007  .  LAPAROSCOPIC ENDOMETRIOSIS FULGURATION  06/1999  . MASS EXCISION  06/19/2012   Procedure: EXCISION MASS;  Surgeon: Merrie Roof, MD;  Location: [facility];  Service: General;  Laterality: Right;  excision mass right abdominal wall    Family History  Problem Relation Age of Onset  . Heart disease Mother   . Diabetes Mother   . Hypertension Mother   . Heart disease Father   . Diabetes Father   . Hypertension Father   . Bipolar disorder Brother   . Cancer Brother        prostate  . Pancreatic cancer Maternal Grandmother   . Cancer Maternal Grandmother        pancreatic  . Cancer Maternal Uncle        lung  . Cancer Paternal Aunt   . Bipolar disorder Sister     Social History   Socioeconomic History  . Marital status: Married    Spouse name: Not on file  . Number of children: Not on file  . Years of education: Not on file  . Highest education level: Not on file  Occupational History  . Not on file  Tobacco Use  . Smoking status: Current Every Day Smoker    Packs/day: 1.00    Years: 11.00    Pack years: 11.00    Types: Cigarettes  . Smokeless tobacco: Never Used  . Tobacco comment: too much stress  Substance and Sexual Activity  . Alcohol use: No  . Drug use: No  . Sexual activity: Yes    Birth control/protection: Surgical  Other Topics Concern  . Not on file  Social History Narrative   At [employer] wanting to get apply to nursing school.  Plans to apply 2/13.     Social Determinants of Health   Financial Resource Strain:   . Difficulty of Paying Living Expenses: Not on file  Food Insecurity:   . Worried About Charity fundraiser in the Last Year: Not on file  . Ran Out of Food in the Last Year: Not on file  Transportation Needs:   . Lack of Transportation (Medical): Not on file  . Lack of Transportation (Non-Medical): Not on file  Physical Activity:   . Days of Exercise per Week: Not on file  . Minutes of Exercise per Session: Not on file   Stress:   . Feeling of Stress : Not on file  Social Connections:   . Frequency of Communication with Friends and Family: Not on file  . Frequency of Social Gatherings with Friends and Family: Not on file  . Attends Religious Services: Not on file  . Active Member of Clubs or Organizations: Not on file  . Attends Archivist Meetings: Not on file  . Marital Status: Not on file     Observations/Objective: Awake, alert and oriented x 3   Review of Systems  Constitutional: Negative for fever, malaise/fatigue and weight loss.  HENT: Negative.  Negative for nosebleeds.   Eyes: Negative.  Negative for blurred vision, double vision and photophobia.  Respiratory: Negative.  Negative for cough and shortness of breath.   Cardiovascular: Negative.  Negative for chest pain, palpitations  and leg swelling.  Gastrointestinal: Positive for heartburn. Negative for nausea and vomiting.  Musculoskeletal: Positive for back pain, joint pain and myalgias.  Neurological: Negative.  Negative for dizziness, focal weakness, seizures and headaches.  Endo/Heme/Allergies: Positive for environmental allergies.  Psychiatric/Behavioral: Positive for depression. Negative for suicidal ideas. The patient is nervous/anxious.     Assessment and Plan: [name] was seen today for establish care.  Diagnoses and all orders for this visit:  Encounter to establish care  Essential hypertension -     CMP14+EGFR; Future Continue all antihypertensives as prescribed.  Remember to bring in your blood pressure log with you for your follow up appointment.  DASH/Mediterranean Diets are healthier choices for HTN.    Tobacco dependence M[place]  Does not endorse a readiness to quit at this time.   Prediabetes -     Hemoglobin A1c; Future  Screening for deficiency anemia -     CBC; Future  Lipid screening -     Lipid panel; Future  Adjustment disorder with depressed mood -     Ambulatory referral to  Psychiatry  Attention deficit hyperactivity disorder (ADHD), unspecified ADHD type -     Ambulatory referral to Psychiatry  Seasonal allergies -     montelukast (SINGULAIR) 10 MG tablet; Take 1 tablet (10 mg total) by mouth at bedtime. -     cetirizine (ZYRTEC) 10 MG tablet; Take 1 tablet (10 mg total) by mouth daily.  Allergy to shellfish -     EPINEPHrine 0.3 mg/0.3 mL IJ SOAJ injection; Inject 0.3 mLs (0.3 mg total) into the muscle as needed for anaphylaxis.     Follow Up Instructions Return for Fasting labs.     I discussed the assessment and treatment plan with the patient. The patient was provided an opportunity to ask questions and all were answered. The patient agreed with the plan and demonstrated an understanding of the instructions.   The patient was advised to call back or seek an in-person evaluation if the symptoms worsen or if the condition fails to improve as anticipated.  I provided 19 minutes of non-face-to-face time during this encounter including median intraservice time, reviewing previous notes, labs, imaging, medications and explaining diagnosis and management.  Gildardo Pounds, FNP-BC

## 2020-01-25 ENCOUNTER — Other Ambulatory Visit: Payer: Self-pay

## 2020-01-25 ENCOUNTER — Other Ambulatory Visit: Payer: Medicaid Other

## 2020-01-25 DIAGNOSIS — Z130 Encounter for screening for diseases of the blood and blood-forming organs and certain disorders involving the immune mechanism: Secondary | ICD-10-CM

## 2020-01-25 DIAGNOSIS — I10 Essential (primary) hypertension: Secondary | ICD-10-CM

## 2020-01-25 DIAGNOSIS — R7303 Prediabetes: Secondary | ICD-10-CM

## 2020-01-25 DIAGNOSIS — Z13220 Encounter for screening for lipoid disorders: Secondary | ICD-10-CM

## 2020-01-25 DIAGNOSIS — M3500 Sicca syndrome, unspecified: Principal | ICD-10-CM

## 2020-01-25 NOTE — Progress Notes (Signed)
Patient here for labs ordered during recent televisit.

## 2020-01-26 LAB — CMP14+EGFR
ALT: 16 IU/L (ref 0–32)
AST: 16 IU/L (ref 0–40)
Albumin/Globulin Ratio: 2 (ref 1.2–2.2)
Albumin: 4.2 g/dL (ref 3.8–4.8)
Alkaline Phosphatase: 76 IU/L (ref 39–117)
BUN/Creatinine Ratio: 11 (ref 9–23)
BUN: 7 mg/dL (ref 6–20)
Bilirubin Total: 0.3 mg/dL (ref 0.0–1.2)
CO2: 19 mmol/L — ABNORMAL LOW (ref 20–29)
Calcium: 9 mg/dL (ref 8.7–10.2)
Chloride: 108 mmol/L — ABNORMAL HIGH (ref 96–106)
Creatinine, Ser: 0.63 mg/dL (ref 0.57–1.00)
GFR calc Af Amer: 133 mL/min/{1.73_m2} (ref >59)
GFR calc non Af Amer: 115 mL/min/{1.73_m2} (ref >59)
Globulin, Total: 2.1 g/dL (ref 1.5–4.5)
Glucose: 85 mg/dL (ref 65–99)
Potassium: 4.1 mmol/L (ref 3.5–5.2)
Sodium: 139 mmol/L (ref 134–144)
Total Protein: 6.3 g/dL (ref 6.0–8.5)

## 2020-01-26 LAB — LIPID PANEL
Chol/HDL Ratio: 4.3 ratio (ref 0.0–4.4)
Cholesterol, Total: 120 mg/dL (ref 100–199)
HDL: 28 mg/dL — ABNORMAL LOW (ref >39)
LDL Chol Calc (NIH): 66 mg/dL (ref 0–99)
Triglycerides: 150 mg/dL — ABNORMAL HIGH (ref 0–149)
VLDL Cholesterol Cal: 26 mg/dL (ref 5–40)

## 2020-01-26 LAB — CBC
Hematocrit: 44.6 % (ref 34.0–46.6)
Hemoglobin: 14.8 g/dL (ref 11.1–15.9)
MCH: 30.2 pg (ref 26.6–33.0)
MCHC: 33.2 g/dL (ref 31.5–35.7)
MCV: 91 fL (ref 79–97)
Platelets: 262 10*3/uL (ref 150–450)
RBC: 4.9 x10E6/uL (ref 3.77–5.28)
RDW: 12.7 % (ref 11.7–15.4)
WBC: 9.1 10*3/uL (ref 3.4–10.8)

## 2020-01-26 LAB — HEMOGLOBIN A1C
Est. average glucose Bld gHb Est-mCnc: 105 mg/dL
Hgb A1c MFr Bld: 5.3 % (ref 4.8–5.6)

## 2020-01-26 MED ORDER — METHOTREXATE SODIUM (PF) 25 MG/ML INJECTION SOLUTION
3 refills | 0 days | Status: CP
Start: 2020-01-26 — End: ?

## 2020-01-26 NOTE — Unmapped (Signed)
Methotrexate refill  Last ov: 03/05/2019   Next ov: 02/16/2020     Labs:   AST   Date Value Ref Range Status   11/12/2019 18 17 - 47 U/L Final     ALT   Date Value Ref Range Status   11/12/2019 9 <35 U/L Final     Creatinine   Date Value Ref Range Status   11/12/2019 0.61 0.60 - 1.00 mg/dL Final     WBC   Date Value Ref Range Status   11/12/2019 10.7 4.5 - 11.0 10*9/L Final     HGB   Date Value Ref Range Status   11/12/2019 14.1 12.0 - 16.0 g/dL Final     HCT   Date Value Ref Range Status   11/12/2019 44.0 36.0 - 46.0 % Final     MCV   Date Value Ref Range Status   11/12/2019 95.0 80.0 - 100.0 fL Final     RDW   Date Value Ref Range Status   11/12/2019 13.5 12.0 - 15.0 % Final     Platelet   Date Value Ref Range Status   11/12/2019 254 150 - 440 10*9/L Final     Neutrophils %   Date Value Ref Range Status   11/12/2019 60.0 % Final     Lymphocytes %   Date Value Ref Range Status   11/12/2019 31.8 % Final     Monocytes %   Date Value Ref Range Status   11/12/2019 4.1 % Final     Eosinophils %   Date Value Ref Range Status   11/12/2019 1.9 % Final     Basophils %   Date Value Ref Range Status   11/12/2019 0.8 % Final

## 2020-02-02 ENCOUNTER — Encounter: Payer: Self-pay | Admitting: Nurse Practitioner

## 2020-02-02 ENCOUNTER — Other Ambulatory Visit: Payer: Self-pay | Admitting: Nurse Practitioner

## 2020-02-08 ENCOUNTER — Encounter: Payer: Self-pay | Admitting: Nurse Practitioner

## 2020-02-09 DIAGNOSIS — M359 Systemic involvement of connective tissue, unspecified: Principal | ICD-10-CM

## 2020-02-09 DIAGNOSIS — M3500 Sicca syndrome, unspecified: Principal | ICD-10-CM

## 2020-02-09 MED ORDER — PREDNISONE 5 MG TABLET
ORAL_TABLET | 0 refills | 0 days | Status: CP
Start: 2020-02-09 — End: ?

## 2020-02-09 NOTE — Unmapped (Signed)
Tried to reach patient to discuss her concerns and recent symptoms that she reached out through [employer]. No answer. Will respond via Mychart as well.     Kim Dubach C. Scarlette Calico, MD, PhD  Assistant Professor of Medicine  Department of Medicine/Division of Rheumatology  [facility]

## 2020-02-15 NOTE — Unmapped (Signed)
[facility] Refill Coordination Note    Specialty Medication(s) to be Shipped:   Inflammatory Disorders: Humira    Other medication(s) to be shipped:       Kim Charles, DOB: 1981/07/05  Phone: [phone] (home)       All above HIPAA information was verified with patient.     Was a Nurse, learning disability used for this call? No    Completed refill call assessment today to schedule patient's medication shipment from the [facility] [phone]).       Specialty medication(s) and dose(s) confirmed: Regimen is correct and unchanged.   Changes to medications: Kim Charles reports no changes at this time.  Changes to insurance: No  Questions for the pharmacist: No    Confirmed patient received Welcome Packet with first shipment. The patient will receive a drug information handout for each medication shipped and additional FDA Medication Guides as required.       DISEASE/MEDICATION-SPECIFIC INFORMATION        For patients on injectable medications: Patient currently has 1 doses left.  Next injection is scheduled for 02/19.    SPECIALTY MEDICATION ADHERENCE     Medication Adherence    Patient reported X missed doses in the last month: 0  Specialty Medication: Humira CF 40 mg/0.4 ml   Patient is on additional specialty medications: No  Informant: patient                humira 40/0.4 mg/ml: 7 days of medicine on hand         SHIPPING     Shipping address confirmed in Epic.     Delivery Scheduled: Yes, Expected medication delivery date: 02/18.     Medication will be delivered via UPS to the prescription address in Epic WAM.    Kim Charles   [facility] Specialty Technician

## 2020-02-16 ENCOUNTER — Telehealth: Admit: 2020-02-16 | Discharge: 2020-02-17 | Payer: MEDICAID

## 2020-02-16 DIAGNOSIS — M359 Systemic involvement of connective tissue, unspecified: Principal | ICD-10-CM

## 2020-02-16 DIAGNOSIS — M797 Fibromyalgia: Principal | ICD-10-CM

## 2020-02-16 DIAGNOSIS — Z79899 Other long term (current) drug therapy: Principal | ICD-10-CM

## 2020-02-16 DIAGNOSIS — L732 Hidradenitis suppurativa: Principal | ICD-10-CM

## 2020-02-16 DIAGNOSIS — M3500 Sicca syndrome, unspecified: Principal | ICD-10-CM

## 2020-02-16 MED ORDER — METHOTREXATE SODIUM (PF) 25 MG/ML INJECTION SOLUTION
SUBCUTANEOUS | 1 refills | 140 days | Status: CP
Start: 2020-02-16 — End: ?

## 2020-02-16 MED ORDER — HYDROXYCHLOROQUINE 200 MG TABLET
ORAL_TABLET | Freq: Every day | ORAL | 3 refills | 180.00000 days | Status: CP
Start: 2020-02-16 — End: ?

## 2020-02-16 NOTE — Unmapped (Signed)
I spent 32 minutes on the real-time audio and video visit with the patient on the date of service. I spent an additional 10 minutes on pre- and post-visit activities.     The patient was not located and I was not located within 250 yards of a hospital based location during the real-time audio and video visit. The patient was physically located in [place] or a state in which I am permitted to provide care. The patient and/or parent/guardian understood that s/he may incur co-pays and cost sharing, and agreed to the telemedicine visit. The visit was reasonable and appropriate under the circumstances given the patient's presentation at the time.    The patient and/or parent/guardian has been advised of the potential risks and limitations of this mode of treatment (including, but not limited to, the absence of in-person examination) and has agreed to be treated using telemedicine. The patient's/patient's family's questions regarding telemedicine have been answered.    If the visit was completed in an ambulatory setting, the patient and/or parent/guardian has also been advised to contact their provider???s office for worsening conditions, and seek emergency medical treatment and/or call 911 if the patient deems either necessary.             REASON FOR VISIT: F/u     Identification: Pt self identified using name and date of birth  Patient location: Kim Charles  The limitations of this telemedicine encounter were discussed with patient. Both the patient and myself agreed to this encounter despite these limitations. Benefits of this telemedicine encounter included allowing for continued care of patient and minimizing risk of exposure to COVID-19.     HISTORY: [name] is a 39 y.o. female with hx of undifferentiated connective tissue disease characterized by +ANA, +SSA, photosensitivity, raynaud's, fatigue, weight loss, episodic generalized edema and polyarthralgia with swelling of both large and small joints, recurrent pneumonitis/pleuritis, malar rash, generalized weakness that is worse proximally, nausea/vomiting/abdominal bloating, mild sicca symptoms, oral ulcerations, history of recurrent miscarriages (no VTE, minimally elevated aCL IgG/IgM, neg LAC and neg B2GP1 Ab) and migraines. She is being treated with HCQ.  MTX added in 12/2018 due to concerns for inflammatory arthritis. Changed to SQ mtx in 07/2019.   Additional PMH of childhood leukemia, hidradenitis suppurativa, asthma, OSA, fibromyalgia.     For her hidradenitis, humira was started in 04/2019.    Interim history:   Pt presents for f/u.   Currently on prednisone taper for flaring pain.     She knows she is going through a flare beacause she knows her body. Started having a flare, she thinks maybe due to stress. Swelling of hands, legs, ankles. Fatigue. Unable to wash herself due to fatigue. Started pred yesterday, some relief with this.     When she takes the mtx she gets muscle aches and feels weak. Tolerates this better with leucovorin.     Pain today in back of the calves due to mtx.  Pain in knees and redness, finger MCP pain and swelling. Pain in shoulders and hips. She thinks she needs another shot in the hip, getting bursa injections with ortho.   Was having swelling in the ankles.  Initially she states this was better with spironolactone, but then states that the spironolactone was not all helpful, but in combination with the prednisone she has seen improvement in swelling of the ankles.  She notes pitting swelling of the ankles previously.    She reports rashes around her mouth and hives on her left arm.  She reports a fever of 100 ??F last week, temperature today 97.6.      CURRENT MEDICATIONS:  Current Outpatient Medications   Medication Sig Dispense Refill   ??? acyclovir (ZOVIRAX) 400 MG tablet Take 1 tablet by mouth by mouth 2 times daily for prophylaxis  6   ??? ADALIMUMAB PEN CITRATE FREE 40 MG/0.4 ML Inject 0.4 mL (40 mg total) under the skin every seven (7) days. 4 each 11   ??? albuterol (PROVENTIL HFA;VENTOLIN HFA) 90 mcg/actuation inhaler Inhale 2 puffs daily as needed.      ??? amLODIPine (NORVASC) 5 MG tablet Take 10 mg by mouth daily.      ??? aspirin-acetaminophen-caffeine (EXCEDRIN MIGRAINE) 250-250-65 mg per tablet Take 2 tablets by mouth.     ??? BD TUBERCULIN SYRINGE 1 mL 25 gauge x 5/8 Syrg Use 1 syringe every 7 days with methotrexate 50 each 1   ??? beclomethasone (QVAR) 40 mcg/actuation inhaler Inhale 2 puffs daily as needed.      ??? cloNIDine HCl (CATAPRES) 0.1 MG tablet Take 0.1 mg by mouth Three (3) times a day.      ??? dexlansoprazole (DEXILANT) 60 mg capsule Take 60 mg by mouth.     ??? EPINEPHrine (EPIPEN) 0.3 mg/0.3 mL injection Inject 0.3 mg into the muscle.     ??? escitalopram oxalate (LEXAPRO) 10 MG tablet Take 10 mg by mouth daily.  3   ??? fluticasone propionate (FLOVENT HFA) 220 mcg/actuation inhaler 2 puffs.     ??? gabapentin (NEURONTIN) 300 MG capsule Take 300 mg by mouth Three (3) times a day.     ??? hydroCHLOROthiazide (HYDRODIURIL) 25 MG tablet Take 200 mg by mouth.      ??? HYDROcodone-acetaminophen (NORCO 10-325) 10-325 mg per tablet Take 1 tablet by mouth 5 times daily as needed for pain     ??? hydroxychloroquine (PLAQUENIL) 200 mg tablet TAKE 1 TABLET BY MOUTH 2 TIMES DAILY 60 tablet 11   ??? HYSINGLA ER 20 mg TP24 Take 1 tablet by mouth every 24 hours     ??? ipratropium (ATROVENT) 0.03 % nasal spray 2 sprays into each nostril.     ??? leucovorin (WELLCOVORIN) 15 MG tablet Take 1 tablet (15 mg) by mouth once a week 18-24 hours after the methotrexate. 4 tablet 11   ??? lidocaine (LIDODERM) 5 % patch Apply 1 patch to skin as directed 12 hours on, 12 hours off     ??? metFORMIN (GLUCOPHAGE) 500 MG tablet TAKE 1 TABLET BY MOUTH 2 TIMES DAILY WITH MEALS 180 tablet 3   ??? methotrexate, Preservative Free, 25 mg/mL Soln vial/syringe Inject 0.89mls under the skin once a week **SINGLE USE VIALS, DISCARD AFTER INITIAL DOSE** 8 mL 3   ??? MITIGARE 0.6 mg cap capsule Take 0.6 mg by mouth daily.     ??? montelukast (SINGULAIR) 10 mg tablet Take 10 mg by mouth nightly.     ??? NARCAN 4 mg/actuation nasal spray spray (4 mg) in 1 nostril may repeat dose every 2-3 minutes as needed alternating nostrils with each dose     ??? predniSONE (DELTASONE) 5 MG tablet Taper: 20 mg daily x 1 week, 15 mg daily x 1 week, 10 mg daily x 1 week, 5 mg daily x 1 week, 5 mg every other day x 2 week, then OFF. 75 tablet 0   ??? promethazine (PHENERGAN) 25 MG tablet Take 1 tablet (25 mg total) by mouth every six (6) hours as needed for nausea. 30 tablet  3   ??? propranolol (INDERAL) 40 MG tablet Take 40 mg by mouth Two (2) times a day.      ??? topiramate (TOPAMAX) 50 MG tablet TAKE 1 TABLET BY MOUTH 2 TIMES DAILY  5   ??? traZODone (DESYREL) 50 MG tablet Take 50 mg by mouth.     ??? VYVANSE 30 mg capsule Take 30 mg by mouth two (2) times a day.   0   ??? empty container Misc Use as directed to dispose of Humira pens. (Patient not taking: Reported on 02/15/2020) 1 each 2   ??? gabapentin enacarbil 600 mg TbER Take 1 tablet by mouth.       No current facility-administered medications for this visit.        Past Medical History:   Diagnosis Date   ??? ADHD (attention deficit hyperactivity disorder)    ??? Allergic 2007    Allergic to shrimp   ??? Anemia    ??? Asthma    ??? Chronic pain    ??? Disorder of skin or subcutaneous tissue 2001    Hidradenitis suprative   ??? Fibromyalgia    ??? Hidradenitis suppurativa    ??? Hypertension    ??? Leukemia (CMS-HCC)    ??? Lung disease 2017    COPD   ??? Lupus (systemic lupus erythematosus) (CMS-HCC)    ??? Migraines    ??? Osteoarthritis    ??? Scoliosis         Record Review: Available records were reviewed, including pertinent office visits, labs, and imaging.      REVIEW OF SYSTEMS: Ten system were reviewed and negative except as noted above.    PHYSICAL EXAM:  Patient reported vitals:  Vitals:    02/15/20 1309   Weight: 76.7 kg (169 lb)      General:   Pleasant female in no acute distress, WDWN   Lungs:  Normal respiratory effort, no coughing or wheezing    Musculoskeletal:   General: Ambulates w/o assistance   Hands: No overt swelling.  Able to make a tight fist bilaterally.  Wrists:FROM w/o swelling    Elbows: FROM w/o swelling  Shoulders: FROM   Knees: Scar noted on right knee.  No overt effusions.  Ankles: Swelling of right ankle   Psych:  Appropriate affect and mood   Skin:  No rashes.        ASSESSMENT/PLAN:  1. Sjogren's syndrome vs UCTD   Recent flaring symptoms including severe fatigue and diffuse arthralgia, improved now that she has started prednisone taper.  Continue prednisone taper until completion.  Continue Plaquenil 200 mg twice daily, methotrexate 15 mg subcu weekly, leucovorin 15 mg 12 to 24 hours after methotrexate.  Check labs below to evaluate for disease activity and medication toxicity, order mailed to patient to complete locally per her request.  - Anti-DNA antibody, double-stranded; Future  - AST; Future  - Albumin; Future  - ALT; Future  - CBC w/ Differential; Future  - Creatinine; Future  - Urinalysis with Culture Reflex; Future  - Protein/Creatinine Ratio, Urine; Future  - C3 complement; Future  - C4 complement; Future  - hydrOXYchloroQUINE (PLAQUENIL) 200 mg tablet; Take 1 tablet (200 mg total) by mouth daily.  Dispense: 180 tablet; Refill: 3  - methotrexate, Preservative Free, 25 mg/mL Soln vial/syringe; Inject 0.6 mL (15 mg total) under the skin once a week.  Dispense: 12 mL; Refill: 1    2. Methotrexate, long term, current use  Checking labs below to evaluate for  medication toxicity.  Order mailed to patient to complete locally.  - AST; Future  - Albumin; Future  - ALT; Future  - CBC w/ Differential; Future  - Creatinine; Future    3. Hidradenitis suppurativa  Continue follow-up with dermatology, continue Humira per their recommendations.    4. Fibromyalgia  Suspect this is contributing to flaring pain.  Continue to monitor.      HCM:   - PCV13 Status: Consider rendering in the future.   - PPSV 23 Status:01/19/15  - Annual Influenza vaccine. Status:  Up-to-date per patient  - Bone health: not on prednisone   - Plaquenil eye exam: Done locally in 12/2019 per patient, asked pt to have notes faxed to [place]   - Contraception: s/p hysterectomy   - COVID-19 vaccine: No contraindications from a rheumatology perspective for vaccine. No need to hold disease modifying therapies around time of vaccine.        Return to clinic as scheduled in 4 months with Dr Scarlette Calico

## 2020-02-17 MED FILL — HUMIRA PEN CITRATE FREE 40 MG/0.4 ML: 28 days supply | Qty: 4 | Fill #10 | Status: AC

## 2020-02-17 MED FILL — HUMIRA PEN CITRATE FREE 40 MG/0.4 ML: SUBCUTANEOUS | 28 days supply | Qty: 4 | Fill #10

## 2020-02-20 ENCOUNTER — Ambulatory Visit: Payer: Medicaid Other | Attending: Internal Medicine

## 2020-02-20 DIAGNOSIS — Z23 Encounter for immunization: Secondary | ICD-10-CM | POA: Insufficient documentation

## 2020-02-20 NOTE — Progress Notes (Signed)
   Covid-19 Vaccination Clinic  Name:  [name]    MRN: [id number] DOB: November 29, 1981  02/20/2020  [name] was observed post Covid-19 immunization for 30 minutes based on pre-vaccination screening without incidence. She was provided with Vaccine Information Sheet and instruction to access the V-Safe system.   [name] was instructed to call 911 with any severe reactions post vaccine: Marland Kitchen Difficulty breathing  . Swelling of your face and throat  . A fast heartbeat  . A bad rash all over your body  . Dizziness and weakness    Immunizations Administered    Name Date Dose VIS Date Route   Pfizer COVID-19 Vaccine 02/20/2020  1:58 PM 0.3 mL 12/11/2019 Intramuscular   Manufacturer: [place]   Lot: [id number]   [place]: [id number]

## 2020-02-24 ENCOUNTER — Encounter: Payer: Self-pay | Admitting: Nurse Practitioner

## 2020-02-29 ENCOUNTER — Ambulatory Visit
Admission: EM | Admit: 2020-02-29 | Discharge: 2020-02-29 | Disposition: A | Discharge status: Home / Self Care | Payer: Medicaid Other

## 2020-02-29 ENCOUNTER — Ambulatory Visit (INDEPENDENT_AMBULATORY_CARE_PROVIDER_SITE_OTHER): Payer: Medicaid Other

## 2020-02-29 DIAGNOSIS — Z20822 Contact with and (suspected) exposure to covid-19: Secondary | ICD-10-CM | POA: Diagnosis not present

## 2020-02-29 DIAGNOSIS — R059 Cough, unspecified: Secondary | ICD-10-CM

## 2020-02-29 DIAGNOSIS — F17200 Nicotine dependence, unspecified, uncomplicated: Secondary | ICD-10-CM | POA: Diagnosis not present

## 2020-02-29 DIAGNOSIS — R05 Cough: Secondary | ICD-10-CM

## 2020-02-29 DIAGNOSIS — J209 Acute bronchitis, unspecified: Secondary | ICD-10-CM | POA: Diagnosis not present

## 2020-02-29 MED ORDER — IPRATROPIUM BROMIDE 0.06 % NA SOLN: 2.0000 | Freq: Four times a day (QID) | NASAL | 0 refills | Status: DC

## 2020-02-29 MED ORDER — DOXYCYCLINE HYCLATE 100 MG PO CAPS: 100.0000 mg | ORAL_CAPSULE | Freq: Two times a day (BID) | ORAL | 0 refills | Status: DC
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
## 2020-02-29 NOTE — ED Triage Notes (Signed)
Pt c/o productive cough with green sputum, wheezing, and congestion since 2/21. States got a neg. COVID test on 2/23. Pt states hx of PNA and feels the same.

## 2020-02-29 NOTE — Discharge Instructions (Signed)
COVID PCR testing ordered. I would like you to quarantine until testing results. Chest xray negative. Start doxycycline to cover for possible bacterial infection (bronchitis/sinus infection). Atrovent for nasal congestion. Continue albuterol as needed.  Tylenol/motrin for pain and fever. Keep hydrated, urine should be clear to pale yellow in color. If experiencing shortness of breath, trouble breathing, go to the emergency department for further evaluation needed.

## 2020-03-02 LAB — NOVEL CORONAVIRUS, NAA: SARS-CoV-2, NAA: NOT DETECTED

## 2020-03-10 NOTE — Unmapped (Signed)
Kim Charles reports her Humira is going well, and she only had a small HS flare recently when she held a dose due to bronchitis.      She is still having nausea/diarrhea/belly cramping along with fatigue for 3 days post-methotrexate dose. I see notes in her chart where Dr. I tried to reach out to discuss. I'll loop back with the Rheumatology CPP to see if she has other thoughts.     I recommended she use phenergan 30 mins prior to mtx dose to help with nausea since it seems to occur with each dose.     [facility] Clinical Assessment & Refill Coordination Note    Kim Charles, DOB: [birth year]  Phone: [phone] (home)     All above HIPAA information was verified with patient.     Was a Nurse, learning disability used for this call? No    Specialty Medication(s):   Inflammatory Disorders: Humira     Current Outpatient Medications   Medication Sig Dispense Refill   ??? acyclovir (ZOVIRAX) 400 MG tablet Take 1 tablet by mouth by mouth 2 times daily for prophylaxis  6   ??? ADALIMUMAB PEN CITRATE FREE 40 MG/0.4 ML Inject 0.4 mL (40 mg total) under the skin every seven (7) days. 4 each 11   ??? albuterol (PROVENTIL HFA;VENTOLIN HFA) 90 mcg/actuation inhaler Inhale 2 puffs daily as needed.      ??? amLODIPine (NORVASC) 5 MG tablet Take 10 mg by mouth daily.      ??? aspirin-acetaminophen-caffeine (EXCEDRIN MIGRAINE) 250-250-65 mg per tablet Take 2 tablets by mouth.     ??? BD TUBERCULIN SYRINGE 1 mL 25 gauge x 5/8 Syrg Use 1 syringe every 7 days with methotrexate 50 each 1   ??? beclomethasone (QVAR) 40 mcg/actuation inhaler Inhale 2 puffs daily as needed.      ??? cloNIDine HCl (CATAPRES) 0.1 MG tablet Take 0.1 mg by mouth Three (3) times a day.      ??? dexlansoprazole (DEXILANT) 60 mg capsule Take 60 mg by mouth.     ??? empty container Misc Use as directed to dispose of Humira pens. (Patient not taking: Reported on 02/15/2020) 1 each 2   ??? EPINEPHrine (EPIPEN) 0.3 mg/0.3 mL injection Inject 0.3 mg into the muscle.     ??? escitalopram oxalate (LEXAPRO) 10 MG tablet Take 10 mg by mouth daily.  3   ??? fluticasone propionate (FLOVENT HFA) 220 mcg/actuation inhaler 2 puffs.     ??? gabapentin (NEURONTIN) 300 MG capsule Take 300 mg by mouth Three (3) times a day.     ??? hydroCHLOROthiazide (HYDRODIURIL) 25 MG tablet Take 200 mg by mouth.      ??? HYDROcodone-acetaminophen (NORCO 10-325) 10-325 mg per tablet Take 1 tablet by mouth 5 times daily as needed for pain     ??? hydrOXYchloroQUINE (PLAQUENIL) 200 mg tablet Take 1 tablet (200 mg total) by mouth daily. 180 tablet 3   ??? HYSINGLA ER 20 mg TP24 Take 1 tablet by mouth every 24 hours     ??? ipratropium (ATROVENT) 0.03 % nasal spray 2 sprays into each nostril.     ??? leucovorin (WELLCOVORIN) 15 MG tablet Take 1 tablet (15 mg) by mouth once a week 18-24 hours after the methotrexate. 4 tablet 11   ??? lidocaine (LIDODERM) 5 % patch Apply 1 patch to skin as directed 12 hours on, 12 hours off     ??? metFORMIN (GLUCOPHAGE) 500 MG tablet TAKE 1 TABLET BY MOUTH  2 TIMES DAILY WITH MEALS 180 tablet 3   ??? methotrexate, Preservative Free, 25 mg/mL Soln vial/syringe Inject 0.6 mL (15 mg total) under the skin once a week. 12 mL 1   ??? MITIGARE 0.6 mg cap capsule Take 0.6 mg by mouth daily.     ??? montelukast (SINGULAIR) 10 mg tablet Take 10 mg by mouth nightly.     ??? NARCAN 4 mg/actuation nasal spray spray (4 mg) in 1 nostril may repeat dose every 2-3 minutes as needed alternating nostrils with each dose     ??? predniSONE (DELTASONE) 5 MG tablet Taper: 20 mg daily x 1 week, 15 mg daily x 1 week, 10 mg daily x 1 week, 5 mg daily x 1 week, 5 mg every other day x 2 week, then OFF. 75 tablet 0   ??? promethazine (PHENERGAN) 25 MG tablet Take 1 tablet (25 mg total) by mouth every six (6) hours as needed for nausea. 30 tablet 3   ??? propranolol (INDERAL) 40 MG tablet Take 40 mg by mouth Two (2) times a day.      ??? topiramate (TOPAMAX) 50 MG tablet TAKE 1 TABLET BY MOUTH 2 TIMES DAILY  5   ??? traZODone (DESYREL) 50 MG tablet Take 50 mg by mouth.     ??? VYVANSE 30 mg capsule Take 30 mg by mouth two (2) times a day.   0     No current facility-administered medications for this visit.         Changes to medications: Kim Charles reports no changes at this time.    Allergies   Allergen Reactions   ??? Other Hives, Itching and Other (See Comments)     High fever. Hives and itching all over the body.   ??? Shellfish Containing Products Anaphylaxis   ??? Sulfasalazine Hives   ??? Toradol [Ketorolac] Hives   ??? Venom-Honey Bee Anaphylaxis   ??? Opioids - Morphine Analogues      Other reaction(s): Other  Other reaction(s): Other (See Comments)  Causes migraine  Headache   ??? Tramadol Nausea And Vomiting   ??? Hydromorphone Hcl      migraine   ??? Naproxen      Grits teeth       Changes to allergies: No    SPECIALTY MEDICATION ADHERENCE     Humira - 2 left  Medication Adherence    Patient reported X missed doses in the last month: 1  Specialty Medication: Humira  Patient is on additional specialty medications: No          Specialty medication(s) dose(s) confirmed: Regimen is correct and unchanged.     Are there any concerns with adherence? No    Adherence counseling provided? Not needed    CLINICAL MANAGEMENT AND INTERVENTION      Clinical Benefit Assessment:    Do you feel the medicine is effective or helping your condition? Yes    Clinical Benefit counseling provided? Not needed    Adverse Effects Assessment:    Are you experiencing any side effects? No    Are you experiencing difficulty administering your medicine? No    Quality of Life Assessment:    How many days over the past month did your HS  keep you from your normal activities? For example, brushing your teeth or getting up in the morning. 0    Have you discussed this with your provider? Not needed    Therapy Appropriateness:    Is therapy appropriate? Yes, therapy is  appropriate and should be continued    DISEASE/MEDICATION-SPECIFIC INFORMATION      For patients on injectable medications: Patient currently has 2 doses left.  Next injection is scheduled for Monday, 3/15.    PATIENT SPECIFIC NEEDS     ? Does the patient have any physical, cognitive, or cultural barriers? No    ? Is the patient high risk? No     ? Does the patient require a Care Management Plan? No     ? Does the patient require physician intervention or other additional services (i.e. nutrition, smoking cessation, social work)? No      SHIPPING     Specialty Medication(s) to be Shipped:   Inflammatory Disorders: Humira    Other medication(s) to be shipped: NA     Changes to insurance: No    Delivery Scheduled: Yes, Expected medication delivery date: Tues, March 23.     Medication will be delivered via UPS to the confirmed prescription address in [facility].    The patient will receive a drug information handout for each medication shipped and additional FDA Medication Guides as required.  Verified that patient has previously received a Conservation officer, historic buildings.    All of the patient's questions and concerns have been addressed.    Kim Charles   [facility] Specialty Pharmacist

## 2020-03-11 ENCOUNTER — Encounter: Payer: Self-pay | Admitting: Nurse Practitioner

## 2020-03-11 NOTE — Unmapped (Signed)
[facility] RHEUMATOLOGY CLINIC - PHARMACIST NOTES    Received message from Adolphus Birchwood, PharmD @ [facility] regarding side effects related to methotrexate therapy.  Called patient to speak with her about the fatigue and nausea that she has been experiencing for 72 hours after her weekly methotrexate dose. The patient said that she had skipped a dose recently because she had bronchitis and got the COVID-19 vaccine, and she noticed that she had more energy and felt better during that week. When taking her most recent methotrexate dose, it caused her to dry heave and vomit. She described her fatigue as more than lupus tired and only experiences nausea/vomitting in the days after her weekly dose. [name] has been taking her leucovorin dose 24 hours after her methotrexate as directed, but has not tried promethazine yet for her nausea. She agreed to try taking promethazine 30 minutes before her next methotrexate dose as suggested by her pharmacist to see if this helps her nausea.    Harley Hallmark, PharmD Candidate      Sheliah Mends, PharmD

## 2020-03-14 ENCOUNTER — Ambulatory Visit: Payer: Medicaid Other

## 2020-03-14 NOTE — Unmapped (Signed)
[facility] RHEUMATOLOGY CLINIC - PHARMACIST NOTES    Called patient to let her know Dr. Scarlette Calico would like her to try administering promethazine and dextromethorphan on the day of methotrexate dosing to help relieve some of the side effects of nausea and fatigue. Advised the patient to take one tablet of Mucinex DM in the morning and one in the night on the day she is taking her methotrexate dose. Also recommended Ms. Sand take the promethazine 30 minutes before her methotrexate dose. [name] usually takes her dose on Tuesday, so she will follow-up with a MyChart message to let us know how she feels after trying the promethazine and dextromethorphan.    Harley Hallmark, PharmD Candidate    Sheliah Mends, PharmD

## 2020-03-15 ENCOUNTER — Encounter: Payer: Self-pay | Admitting: Nurse Practitioner

## 2020-03-15 ENCOUNTER — Ambulatory Visit: Payer: Medicaid Other | Attending: Internal Medicine

## 2020-03-15 DIAGNOSIS — Z23 Encounter for immunization: Secondary | ICD-10-CM

## 2020-03-15 NOTE — Progress Notes (Signed)
   Covid-19 Vaccination Clinic  Name:  [name]    MRN: [id number] DOB: May 27, 1981  03/15/2020  [name] was observed post Covid-19 immunization for 15 minutes without incident. She was provided with Vaccine Information Sheet and instruction to access the V-Safe system.   [name] was instructed to call 911 with any severe reactions post vaccine: Marland Kitchen Difficulty breathing  . Swelling of face and throat  . A fast heartbeat  . A bad rash all over body  . Dizziness and weakness   Immunizations Administered    Name Date Dose VIS Date Route   Pfizer COVID-19 Vaccine 03/15/2020  1:14 PM 0.3 mL 12/11/2019 Intramuscular   Manufacturer: [place]   Lot: [id number]   [place]: [id number]

## 2020-03-18 ENCOUNTER — Ambulatory Visit (INDEPENDENT_AMBULATORY_CARE_PROVIDER_SITE_OTHER): Payer: Medicaid Other | Admitting: Family Medicine

## 2020-03-18 DIAGNOSIS — E669 Obesity, unspecified: Secondary | ICD-10-CM

## 2020-03-18 DIAGNOSIS — I10 Essential (primary) hypertension: Secondary | ICD-10-CM

## 2020-03-18 DIAGNOSIS — F17200 Nicotine dependence, unspecified, uncomplicated: Secondary | ICD-10-CM

## 2020-03-18 DIAGNOSIS — Z716 Tobacco abuse counseling: Secondary | ICD-10-CM

## 2020-03-18 MED ORDER — SPIRONOLACTONE 25 MG PO TABS: 25.0000 mg | ORAL_TABLET | Freq: Every day | ORAL | 1 refills | Status: DC

## 2020-03-18 MED ORDER — AMLODIPINE BESYLATE 10 MG PO TABS: 10.0000 mg | ORAL_TABLET | Freq: Every day | ORAL | 1 refills | Status: DC

## 2020-03-18 MED ORDER — PROPRANOLOL HCL 40 MG PO TABS: 40.0000 mg | ORAL_TABLET | Freq: Two times a day (BID) | ORAL | 1 refills | Status: DC

## 2020-03-18 MED ORDER — VARENICLINE TARTRATE 1 MG PO TABS: 1.0000 mg | ORAL_TABLET | Freq: Two times a day (BID) | ORAL | 3 refills | Status: DC

## 2020-03-18 MED ORDER — CHANTIX STARTING MONTH PAK 0.5 MG X 11 & 1 MG X 42 PO TABS: ORAL_TABLET | ORAL | 0 refills | Status: DC

## 2020-03-18 MED ORDER — SPIRONOLACTONE 25 MG TABLET
ORAL | 0.00000 days
Start: 2020-03-18 — End: ?

## 2020-03-18 NOTE — Progress Notes (Signed)
DOB and Name verified  Pt took her COVID 2days ago. Little  weak and tired   C /o lupus flare up , fingers and knuckles  Swelling X 1 month on and off  Needs med refills on her meds  Pt would like to have prescribed Chantix   States is borderline diabetes/ does not check sugars daily
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
## 2020-03-21 ENCOUNTER — Encounter: Payer: Self-pay | Admitting: Family Medicine

## 2020-03-21 DIAGNOSIS — L732 Hidradenitis suppurativa: Principal | ICD-10-CM

## 2020-03-21 NOTE — Unmapped (Signed)
Kim Charles 's HUMIRA shipment will be delayed as a result of prior authorization being required by the patient's insurance.     I have reached out to the patient and communicated the delay. We will call the patient back to reschedule the delivery upon resolution. We have not confirmed the new delivery date.

## 2020-03-22 NOTE — Unmapped (Signed)
Spoke with [name] and she would like for her Humira to be sent out tomorrow 03/23/20 via UPS for an estimated delivery of 03/24/20.

## 2020-03-22 NOTE — Unmapped (Signed)
Kim Charles 's HUMIRA shipment will be delayed as a result of prior authorization now approved.     I have reached out to the patient and left a voicemail message.  We will wait for a call back from the patient to reschedule the delivery.  We have not confirmed the new delivery date.

## 2020-03-23 MED FILL — HUMIRA PEN CITRATE FREE 40 MG/0.4 ML: 28 days supply | Qty: 4 | Fill #11 | Status: AC

## 2020-03-23 MED FILL — EMPTY CONTAINER: 120 days supply | Qty: 1 | Fill #2 | Status: AC

## 2020-03-23 MED FILL — HUMIRA PEN CITRATE FREE 40 MG/0.4 ML: SUBCUTANEOUS | 28 days supply | Qty: 4 | Fill #11

## 2020-03-23 MED FILL — EMPTY CONTAINER: 120 days supply | Qty: 1 | Fill #2

## 2020-04-06 ENCOUNTER — Encounter: Payer: Self-pay | Admitting: Internal Medicine

## 2020-04-06 ENCOUNTER — Telehealth (INDEPENDENT_AMBULATORY_CARE_PROVIDER_SITE_OTHER): Payer: Medicaid Other | Admitting: Internal Medicine

## 2020-04-06 DIAGNOSIS — M51369 Other intervertebral disc degeneration, lumbar region without mention of lumbar back pain or lower extremity pain: Secondary | ICD-10-CM | POA: Insufficient documentation

## 2020-04-06 DIAGNOSIS — M5136 Other intervertebral disc degeneration, lumbar region: Secondary | ICD-10-CM | POA: Diagnosis not present

## 2020-04-06 DIAGNOSIS — M329 Systemic lupus erythematosus, unspecified: Secondary | ICD-10-CM | POA: Diagnosis not present

## 2020-04-06 DIAGNOSIS — IMO0002 Reserved for concepts with insufficient information to code with codable children: Secondary | ICD-10-CM | POA: Insufficient documentation

## 2020-04-06 NOTE — Progress Notes (Signed)
Virtual Visit via Telephone Note  I connected with [name], on 04/06/2020 at 2:58 PM by telephone due to the COVID-19 pandemic and verified that I am speaking with the correct person using two identifiers.   Consent: I discussed the limitations, risks, security and privacy concerns of performing an evaluation and management service by telephone and the availability of in person appointments. I also discussed with the patient that there may be a patient responsible charge related to this service. The patient expressed understanding and agreed to proceed.   Location of Patient: Home   Location of Provider: Clinic    Persons participating in Telemedicine visit: Oliver M Burrowes [facility] Dr. Juleen China      History of Present Illness: Patient has a visit for referral requests.  Requests new pain clinic referral because her current pain clinic is switching to all Suboxone and she doesn't want to be on the Suboxone. Her clinic will be switching in June. She is being managed there for her Lupus, Gout, RA, DDD.   She is followed by [facility] for the DDD and receives steroid injections there. She needs a new referral to continue to see them. Already an established patient. Last injection was 6 months ago.    Past Medical History:  Diagnosis Date  . Abdominal wall mass   . ADHD (attention deficit hyperactivity disorder)   . Anemia   . Arthritis   . Asthma   . Back pain   . Chest pain   . Depression    tried several meds, not on any now  . Diarrhea   . Endometriosis   . Headache(784.0)   . Hydradenitis   . Hydradenitis 07/13/2011  . Hypertension    on inderal  . Leukemia ([place]) at age 58  . Lupus ([place])   . Migraines   . MRSA (methicillin resistant staph aureus) culture positive 2010   ear infection that cultured out MRSA   . Nausea   . S/P total abdominal hysterectomy 06/26/2011  . Scoliosis   . Thyroid disease    Allergies  Allergen Reactions  . Bee Venom  Anaphylaxis  . Ketorolac Hives  . Shrimp [Shellfish Allergy] Anaphylaxis  . Sulfonamide Derivatives Hives, Itching and Other (See Comments)    High fever. Hives and itching all over the body.  Tori Milks [Naproxen]     Grits teeth  . Dilaudid [Hydromorphone Hcl]     migraine  . Morphine And Related Other (See Comments)    Causes migraine  . Tramadol Nausea And Vomiting    Current Outpatient Medications on File Prior to Visit  Medication Sig Dispense Refill  . Adalimumab 40 MG/0.4ML PNKT Inject 0.4 mLs into the skin every Friday.    Marland Kitchen albuterol (PROVENTIL HFA;VENTOLIN HFA) 108 (90 BASE) MCG/ACT inhaler Inhale 2 puffs into the lungs every 6 (six) hours as needed. Wheezing.    Marland Kitchen amLODipine (NORVASC) 10 MG tablet Take 1 tablet (10 mg total) by mouth daily. 90 tablet 1  . beclomethasone (QVAR) 80 MCG/ACT inhaler Inhale 1 puff into the lungs daily as needed (sob).    . cetirizine (ZYRTEC) 10 MG tablet Take 1 tablet (10 mg total) by mouth daily. 90 tablet 3  . cloNIDine (CATAPRES) 0.1 MG tablet Take 1 tablet by mouth 3 (three) times daily.    . colchicine 0.6 MG tablet Take 0.6 mg by mouth daily.    Marland Kitchen DEXILANT 60 MG capsule Take 60 mg by mouth daily.  5  .  EPINEPHrine 0.3 mg/0.3 mL IJ SOAJ injection Inject 0.3 mLs (0.3 mg total) into the muscle as needed for anaphylaxis. 1 each 0  . escitalopram (LEXAPRO) 10 MG tablet Take 10 mg by mouth daily.  0  . gabapentin (NEURONTIN) 300 MG capsule Take 300 mg by mouth 3 (three) times daily.    Marland Kitchen HYDROcodone-acetaminophen (NORCO) 10-325 MG tablet Take 1 tablet by mouth 5 (five) times daily as needed for moderate pain.    . hydroxychloroquine (PLAQUENIL) 200 MG tablet Take 200 mg by mouth 2 (two) times daily.     Marland Kitchen ipratropium (ATROVENT) 0.06 % nasal spray Place 2 sprays into both nostrils 4 (four) times daily. 15 mL 0  . leucovorin (WELLCOVORIN) 15 MG tablet Take 1 tablet (15 mg) by mouth once a week 18-24 hours after the methotrexate.    . lidocaine  (LIDODERM) 5 % Apply 1 patch to skin as directed 12 hours on, 12 hours off    . lisdexamfetamine (VYVANSE) 30 MG capsule Take 30 mg by mouth daily.    . metFORMIN (GLUCOPHAGE) 500 MG tablet Take 500 mg by mouth 2 (two) times daily.    . Methotrexate Sodium (METHOTREXATE, PF,) 250 MG/10ML injection Inject 15 mg into the muscle every Tuesday.    . montelukast (SINGULAIR) 10 MG tablet Take 1 tablet (10 mg total) by mouth at bedtime. 90 tablet 2  . prazosin (MINIPRESS) 2 MG capsule Take 2 mg by mouth at bedtime.    . promethazine (PHENERGAN) 25 MG tablet Take 25 mg by mouth every 6 (six) hours as needed.    . propranolol (INDERAL) 40 MG tablet Take 1 tablet (40 mg total) by mouth 2 (two) times daily. 180 tablet 1  . spironolactone (ALDACTONE) 25 MG tablet Take 1 tablet (25 mg total) by mouth daily. 90 tablet 1  . varenicline (CHANTIX CONTINUING MONTH PAK) 1 MG tablet Take 1 tablet (1 mg total) by mouth 2 (two) times daily. 60 tablet 3  . varenicline (CHANTIX STARTING MONTH PAK) 0.5 MG X 11 & 1 MG X 42 tablet Take one 0.5 mg tablet by mouth once daily for 3 days, then increase to one 0.5 mg tablet twice daily for 4 days, then increase to one 1 mg tablet twice daily. 53 tablet 0   No current facility-administered medications on file prior to visit.    Observations/Objective: NAD. Speaking clearly.  Work of breathing normal.  Alert and oriented. Mood appropriate.   Assessment and Plan: 1. DDD (degenerative disc disease), lumbar - Ambulatory referral to Pain Clinic - Ambulatory referral to Orthopedic Surgery  2. Lupus ([place]) - Ambulatory referral to Pain Clinic   Follow Up Instructions: Annual exam and chronic medical conditions    I discussed the assessment and treatment plan with the patient. The patient was provided an opportunity to ask questions and all were answered. The patient agreed with the plan and demonstrated an understanding of the instructions.   The patient was advised to  call back or seek an in-person evaluation if the symptoms worsen or if the condition fails to improve as anticipated.     I provided 12 minutes total of non-face-to-face time during this encounter including median intraservice time, reviewing previous notes, investigations, ordering medications, medical decision making, coordinating care and patient verbalized understanding at the end of the visit.    Phill Myron, D.O. Primary Care at [facility]  04/06/2020, 2:58 PM

## 2020-04-07 ENCOUNTER — Encounter: Payer: Self-pay | Admitting: Physical Medicine and Rehabilitation

## 2020-04-11 ENCOUNTER — Other Ambulatory Visit: Payer: Self-pay | Admitting: Family Medicine

## 2020-04-12 MED ORDER — LEUCOVORIN CALCIUM 5 MG TABLET
ORAL_TABLET | Freq: Every day | ORAL | 3 refills | 30.00000 days | Status: CP
Start: 2020-04-12 — End: ?

## 2020-04-12 NOTE — Unmapped (Signed)
I pended a prescription for the requested change but can you verify the sig.    Thank you,  Jessi

## 2020-04-12 NOTE — Unmapped (Signed)
Addended by: Jacob Moores on: 04/12/2020 02:10 PM     Modules accepted: Orders

## 2020-04-12 NOTE — Unmapped (Signed)
Reason for call: Pharmacy is requesting for a call back regarding medication of leucovorin 15 mg? They stated that the dont have medication for 15 mg anymore and is requesting if it could be changed to take three of the 5mg  tablets.   -Thanks       [phone]----[facility] - [place], [place] - [address]    Last ov: Visit date not found  Next ov: 06/23/2020

## 2020-04-13 DIAGNOSIS — L732 Hidradenitis suppurativa: Principal | ICD-10-CM

## 2020-04-13 MED ORDER — HUMIRA PEN CITRATE FREE 40 MG/0.4 ML
SUBCUTANEOUS | 1 refills | 28.00000 days | Status: CP
Start: 2020-04-13 — End: ?
  Filled 2020-04-18: qty 4, 28d supply, fill #0

## 2020-04-13 NOTE — Unmapped (Signed)
[facility] Refill Coordination Note    Specialty Medication(s) to be Shipped:   Inflammatory Disorders: Humira    Other medication(s) to be shipped: n/a     Kim Charles, DOB: December 02, 1981  Phone: [phone] (home)       All above HIPAA information was verified with patient.     Was a Nurse, learning disability used for this call? No    Completed refill call assessment today to schedule patient's medication shipment from the [facility] [phone]).       Specialty medication(s) and dose(s) confirmed: Regimen is correct and unchanged.   Changes to medications: Kim Charles reports no changes at this time.  Changes to insurance: No  Questions for the pharmacist: No    Confirmed patient received Welcome Packet with first shipment. The patient will receive a drug information handout for each medication shipped and additional FDA Medication Guides as required.       DISEASE/MEDICATION-SPECIFIC INFORMATION        For patients on injectable medications: Patient currently has 1 doses left.  Next injection is scheduled for 04/18/2020.    SPECIALTY MEDICATION ADHERENCE     Medication Adherence    Patient reported X missed doses in the last month: 0  Specialty Medication: Humira CF 40 mg/0.4 ml  Patient is on additional specialty medications: No  Any gaps in refill history greater than 2 weeks in the last 3 months: no  Demonstrates understanding of importance of adherence: yes  Informant: patient  Reliability of informant: reliable  Confirmed plan for next specialty medication refill: delivery by pharmacy  Refills needed for supportive medications: not needed                Humira CF 40 mg/0.4 ml. 7 days on hand       SHIPPING     Shipping address confirmed in Epic.     Delivery Scheduled: Yes, Expected medication delivery date: 04/19/2020.  However, Rx request for refills was sent to the provider as there are none remaining.     Medication will be delivered via UPS to the prescription address in Epic WAM. Kim Charles Kim Charles   [facility] Specialty Technician

## 2020-04-13 NOTE — Unmapped (Signed)
Last seen June 2020. Requesting humira refill. Pended.

## 2020-04-15 MED ORDER — DEXILANT 60 MG PO CPDR: 60.0000 mg | DELAYED_RELEASE_CAPSULE | Freq: Every day | ORAL | 5 refills | Status: DC

## 2020-04-18 ENCOUNTER — Ambulatory Visit (INDEPENDENT_AMBULATORY_CARE_PROVIDER_SITE_OTHER): Payer: Medicaid Other | Admitting: Bariatrics

## 2020-04-18 ENCOUNTER — Other Ambulatory Visit: Payer: Self-pay

## 2020-04-18 ENCOUNTER — Encounter (INDEPENDENT_AMBULATORY_CARE_PROVIDER_SITE_OTHER): Payer: Self-pay | Admitting: Bariatrics

## 2020-04-18 VITALS — BP 115/84 | HR 79 | Temp 98.2°F | Ht 60.0 in | Wt 166.0 lb

## 2020-04-18 DIAGNOSIS — R0602 Shortness of breath: Secondary | ICD-10-CM

## 2020-04-18 DIAGNOSIS — R5383 Other fatigue: Secondary | ICD-10-CM

## 2020-04-18 DIAGNOSIS — I10 Essential (primary) hypertension: Secondary | ICD-10-CM | POA: Diagnosis not present

## 2020-04-18 DIAGNOSIS — F3289 Other specified depressive episodes: Secondary | ICD-10-CM

## 2020-04-18 DIAGNOSIS — E559 Vitamin D deficiency, unspecified: Secondary | ICD-10-CM

## 2020-04-18 DIAGNOSIS — K219 Gastro-esophageal reflux disease without esophagitis: Secondary | ICD-10-CM

## 2020-04-18 DIAGNOSIS — Z6832 Body mass index (BMI) 32.0-32.9, adult: Secondary | ICD-10-CM

## 2020-04-18 DIAGNOSIS — F908 Attention-deficit hyperactivity disorder, other type: Secondary | ICD-10-CM

## 2020-04-18 DIAGNOSIS — E669 Obesity, unspecified: Secondary | ICD-10-CM

## 2020-04-18 DIAGNOSIS — M549 Dorsalgia, unspecified: Secondary | ICD-10-CM

## 2020-04-18 DIAGNOSIS — Z0289 Encounter for other administrative examinations: Secondary | ICD-10-CM

## 2020-04-18 MED FILL — HUMIRA PEN CITRATE FREE 40 MG/0.4 ML: 28 days supply | Qty: 4 | Fill #0 | Status: AC

## 2020-04-19 ENCOUNTER — Encounter (INDEPENDENT_AMBULATORY_CARE_PROVIDER_SITE_OTHER): Payer: Self-pay | Admitting: Bariatrics

## 2020-04-19 LAB — INSULIN, RANDOM: INSULIN: 8.4 u[IU]/mL (ref 2.6–24.9)

## 2020-04-19 LAB — TSH: TSH: 2.79 u[IU]/mL (ref 0.450–4.500)

## 2020-04-19 LAB — VITAMIN D 25 HYDROXY (VIT D DEFICIENCY, FRACTURES): Vit D, 25-Hydroxy: 27 ng/mL — ABNORMAL LOW (ref 30.0–100.0)

## 2020-04-19 LAB — T3: T3, Total: 93 ng/dL (ref 71–180)

## 2020-04-19 LAB — T4, FREE: Free T4: 1.02 ng/dL (ref 0.82–1.77)

## 2020-04-19 NOTE — Progress Notes (Signed)
Chief Complaint:   OBESITY [name] (MR# [id number]) is a 39 y.o. female who presents for evaluation and treatment of obesity and related comorbidities. Current BMI is Body mass index is 32.42 kg/m.Marland Kitchen [name] has been struggling with her weight for many years and has been unsuccessful in either losing weight, maintaining weight loss, or reaching her healthy weight goal.  [name] is currently in the action stage of change and ready to dedicate time achieving and maintaining a healthier weight. [name] is interested in becoming our patient and working on intensive lifestyle modifications including (but not limited to) diet and exercise for weight loss.  [name] eats out 6 days a week. She likes to cook. She skips breakfast.  [name]'s habits were reviewed today and are as follows: Her family eats meals together, she thinks her family will eat healthier with her, her desired weight loss is 51 lbs, she started gaining weight after being diagnosed with lupus, her heaviest weight ever was 190 pounds, she snacks frequently in the evenings, she skips breakfast everyday, she is frequently drinking liquids with calories, she frequently makes poor food choices, she has binge eating behaviors and she struggles with emotional eating.  Depression Screen [name]'s Food and Mood (modified PHQ-9) score was 21.  Depression screen PHQ 2/9 04/18/2020  Decreased Interest 3  Down, Depressed, Hopeless 3  PHQ - 2 Score 6  Altered sleeping 3  Tired, decreased energy 3  Change in appetite 3  Feeling bad or failure about yourself  3  Trouble concentrating 3  Moving slowly or fidgety/restless 0  Suicidal thoughts 0  PHQ-9 Score 21  Difficult doing work/chores Somewhat difficult   Subjective:   Other fatigue. [name] admits to daytime somnolence and admits to waking up still tired. Patent has a history of symptoms of daytime fatigue, Epworth sleepiness scale and morning headache. [name] generally gets 5 hours  of sleep per night, and states that she does not sleep well most nights. Snoring is present. Apneic episodes are not present. Epworth Sleepiness Score is 12.  Shortness of breath on exertion. [name] notes increasing shortness of breath with certain activities and seems to be worsening over time with weight gain. She notes getting out of breath sooner with activity than she used to. [name] denies shortness of breath at rest or orthopnea.  Essential hypertension. [name] is taking Norvasc, clonidine, HCTZ, and spironolactone. Blood pressure is controlled.  BP Readings from Last 3 Encounters:  04/18/20 115/84  02/29/20 (!) 132/99  07/03/19 (!) 131/98   Lab Results  Component Value Date   CREATININE 0.63 01/25/2020   CREATININE 0.69 01/08/2019   CREATININE 0.69 11/28/2017   Gastroesophageal reflux disease, unspecified whether esophagitis present. [name] is taking Dexilant as needed. Symptoms are controlled.  Back pain, unspecified back location, unspecified back pain laterality, unspecified chronicity. [name] has lupus. Back pain is chronic.  Other depression, with emotional eating. [name] is struggling with emotional eating and using food for comfort to the extent that it is negatively impacting her health. She has been working on behavior modification techniques to help reduce her emotional eating and has been somewhat successful. She shows no sign of suicidal or homicidal ideations. [name] reports stress or emotional eating. PHQ-9 score is 21.  Attention deficit hyperactivity disorder (ADHD), other type. [name] is taking Vyvanse.  Vitamin D deficiency. [name] reports she was on Vitamin D in the past.  Assessment/Plan:   Other fatigue. Fatigue may be related to obesity, depression or many other  causes. Labs will be ordered, and in the meanwhile, [name] will focus on self care including making healthy food choices, increasing physical activity and focusing on stress reduction.  EKG 12-Lead,  T3, T4, free, TSH, Insulin, random  Shortness of breath on exertion. [name]'s shortness of breath appears to be obesity related and exercise induced. She has agreed to work on weight loss and gradually increase exercise to treat her exercise induced shortness of breath. Will continue to monitor closely.  Essential hypertension. [name] is working on healthy weight loss and exercise to improve blood pressure control. We will watch for signs of hypotension as she continues her lifestyle modifications. She will continue her medications as directed.  Gastroesophageal reflux disease, unspecified whether esophagitis present. Intensive lifestyle modifications are the first line treatment for this issue. We discussed several lifestyle modifications today and she will continue to work on diet, exercise and weight loss efforts. Orders and follow up as documented in patient record. [name] will watch triggers.  Counseling . If a person has gastroesophageal reflux disease (GERD), food and stomach acid move back up into the esophagus and cause symptoms or problems such as damage to the esophagus. . Anti-reflux measures include: raising the head of the bed, avoiding tight clothing or belts, avoiding eating late at night, not lying down shortly after mealtime, and achieving weight loss. . Avoid ASA, NSAID's, caffeine, alcohol, and tobacco.  . OTC Pepcid and/or Tums are often very helpful for as needed use.  Marland Kitchen However, for persisting chronic or daily symptoms, stronger medications like Omeprazole may be needed. . You may need to avoid foods and drinks such as: ? Coffee and tea (with or without caffeine). ? Drinks that contain alcohol. ? Energy drinks and sports drinks. ? Bubbly (carbonated) drinks or sodas. ? Chocolate and cocoa. ? Peppermint and mint flavorings. ? Garlic and onions. ? Horseradish. ? Spicy and acidic foods. These include peppers, chili powder, curry powder, vinegar, hot sauces, and BBQ  sauce. ? Citrus fruit juices and citrus fruits, such as oranges, lemons, and limes. ? Tomato-based foods. These include red sauce, chili, salsa, and pizza with red sauce. ? Fried and fatty foods. These include donuts, french fries, potato chips, and high-fat dressings. ? High-fat meats. These include hot dogs, rib eye steak, sausage, ham, and bacon.  Back pain, unspecified back location, unspecified back pain laterality, unspecified chronicity. [name] will gradually increase her activity.  Other depression, with emotional eating. Behavior modification techniques were discussed today to help [name] deal with her emotional/non-hunger eating behaviors.  Orders and follow up as documented in patient record. We discussed CBT techniques with the patient.  Attention deficit hyperactivity disorder (ADHD), other type. [name] will continue Vyvanse as directed and will continue working with psychiatrist.  Vitamin D deficiency. Low Vitamin D level contributes to fatigue and are associated with obesity, breast, and colon cancer. VITAMIN D 25 Hydroxy (Vit-D Deficiency, Fractures) level was ordered today.  Class 1 obesity with serious comorbidity and body mass index (BMI) of 32.0 to 32.9 in adult, unspecified obesity type.  [name] is currently in the action stage of change and her goal is to continue with weight loss efforts. I recommend [name] begin the structured treatment plan as follows:  She has agreed to the Category 1 Plan.  She will work on meal planning and decrease meal skipping.  We independently reviewed labs with the patient including CMP, lipids, CBC, and A1c (5.3).  Exercise goals: All adults should avoid inactivity. Some physical activity is  better than none, and adults who participate in any amount of physical activity gain some health benefits.   Behavioral modification strategies: increasing lean protein intake, decreasing simple carbohydrates, increasing vegetables, increasing water  intake, decreasing eating out, no skipping meals, meal planning and cooking strategies, keeping healthy foods in the home and planning for success.  She was informed of the importance of frequent follow-up visits to maximize her success with intensive lifestyle modifications for her multiple health conditions. She was informed we would discuss her lab results at her next visit unless there is a critical issue that needs to be addressed sooner. [name] agreed to keep her next visit at the agreed upon time to discuss these results.  Objective:   Blood pressure 115/84, pulse 79, temperature 98.2 F (36.8 C), height 5' (1.524 m), weight 166 lb (75.3 kg), SpO2 98 %. Body mass index is 32.42 kg/m.  EKG: Sinus  Rhythm with a rate of 81 BPM. RSR(V1) - nondiagnostic. Probably normal.   Indirect Calorimeter completed today shows a VO2 of 198 and a REE of 1380.  Her calculated basal metabolic rate is [id number] thus her basal metabolic rate is worse than expected.  General: Cooperative, alert, well developed, in no acute distress. HEENT: Conjunctivae and lids unremarkable. Cardiovascular: Regular rhythm.  Lungs: Normal work of breathing. Neurologic: No focal deficits.   Lab Results  Component Value Date   CREATININE 0.63 01/25/2020   BUN 7 01/25/2020   NA 139 01/25/2020   K 4.1 01/25/2020   CL 108 (H) 01/25/2020   CO2 19 (L) 01/25/2020   Lab Results  Component Value Date   ALT 16 01/25/2020   AST 16 01/25/2020   ALKPHOS 76 01/25/2020   BILITOT 0.3 01/25/2020   Lab Results  Component Value Date   HGBA1C 5.3 01/25/2020   Lab Results  Component Value Date   INSULIN 8.4 04/18/2020   Lab Results  Component Value Date   TSH 2.790 04/18/2020   Lab Results  Component Value Date   CHOL 120 01/25/2020   HDL 28 (L) 01/25/2020   LDLCALC 66 01/25/2020   TRIG 150 (H) 01/25/2020   CHOLHDL 4.3 01/25/2020   Lab Results  Component Value Date   WBC 9.1 01/25/2020   HGB 14.8 01/25/2020   HCT  44.6 01/25/2020   MCV 91 01/25/2020   PLT 262 01/25/2020   No results found for: IRON, TIBC, FERRITIN  Attestation Statements:   Reviewed by clinician on day of visit: allergies, medications, problem list, medical history, surgical history, family history, social history, and previous encounter notes.  Time spent on visit including pre-visit chart review and post-visit charting and care was 45 minutes.   Migdalia Dk, am acting as Location manager for [employer], DO   I have reviewed the above documentation for accuracy and completeness, and I agree with the above. Jearld Lesch, DO

## 2020-04-20 NOTE — Unmapped (Signed)
[facility]HEUMATOLOGY CLIN[facility] - PHARMACIST NOTES    Patient previously reported nausea and fatigue with methotrexate therapy.  Recommended patient to take promethazine and dextromethorphan last month. Called to follow up today.      Per patient, nausea resolved with taking promethazine 30 minutes prior to methotrexate dose.  Still feeling fatigue. She did not try dextromethorphan yet but will give it a try this week. Pt also informed she just found out her vitamin D level is very low and will start supplementation soon.    Kim Charles

## 2020-05-02 ENCOUNTER — Encounter (INDEPENDENT_AMBULATORY_CARE_PROVIDER_SITE_OTHER): Payer: Self-pay | Admitting: Bariatrics

## 2020-05-02 ENCOUNTER — Ambulatory Visit (INDEPENDENT_AMBULATORY_CARE_PROVIDER_SITE_OTHER): Payer: Medicaid Other | Admitting: Bariatrics

## 2020-05-02 ENCOUNTER — Other Ambulatory Visit: Payer: Self-pay

## 2020-05-02 VITALS — BP 111/78 | HR 88 | Temp 98.7°F | Ht 60.0 in | Wt 157.0 lb

## 2020-05-02 DIAGNOSIS — Z6830 Body mass index (BMI) 30.0-30.9, adult: Secondary | ICD-10-CM | POA: Diagnosis not present

## 2020-05-02 DIAGNOSIS — E559 Vitamin D deficiency, unspecified: Secondary | ICD-10-CM

## 2020-05-02 DIAGNOSIS — E669 Obesity, unspecified: Secondary | ICD-10-CM

## 2020-05-02 DIAGNOSIS — I10 Essential (primary) hypertension: Secondary | ICD-10-CM | POA: Diagnosis not present

## 2020-05-03 ENCOUNTER — Encounter (INDEPENDENT_AMBULATORY_CARE_PROVIDER_SITE_OTHER): Payer: Self-pay | Admitting: Bariatrics

## 2020-05-03 MED ORDER — VITAMIN D (ERGOCALCIFEROL) 1.25 MG (50000 UNIT) PO CAPS: 50000.0000 [IU] | ORAL_CAPSULE | ORAL | 0 refills | Status: DC

## 2020-05-03 MED ORDER — ERGOCALCIFEROL (VITAMIN D2) 1,250 MCG (50,000 UNIT) CAPSULE
ORAL | 0.00000 days
Start: 2020-05-03 — End: ?

## 2020-05-03 NOTE — Progress Notes (Signed)
Chief Complaint:   OBESITY [name] is here to discuss her progress with her obesity treatment plan along with follow-up of her obesity related diagnoses. [name] is on the Category 1 Plan and states she is following her eating plan approximately 75% of the time. [name] states she is walking 10 minutes 5 times per week.  Today's visit was #: 2 Starting weight: 166 lbs Starting date: 04/18/2020 Today's weight: 157 lbs Today's date: 05/02/2020 Total lbs lost to date: 9 Total lbs lost since last in-office visit: 9  Interim History: [name] is down 9 lbs. She reports not eating past 6:00 p.m. and has cut out bread and soda.  Subjective:   Vitamin D deficiency. [name] is not on Vitamin DF supplementation. Last Vitamin D 27.0 on 04/18/2020.  Essential hypertension. Blood pressure is well controlled.  BP Readings from Last 3 Encounters:  05/02/20 111/78  04/18/20 115/84  02/29/20 (!) 132/99   Lab Results  Component Value Date   CREATININE 0.63 01/25/2020   CREATININE 0.69 01/08/2019   CREATININE 0.69 11/28/2017   Assessment/Plan:   Vitamin D deficiency. Low Vitamin D level contributes to fatigue and are associated with obesity, breast, and colon cancer. She was given a prescription for Vitamin D, Ergocalciferol, (DRISDOL) 1.25 MG (50000 UNIT) CAPS capsule every week and will follow-up for routine testing of Vitamin D, at least 2-3 times per year to avoid over-replacement.    Essential hypertension. [name] is working on healthy weight loss and exercise to improve blood pressure control. We will watch for signs of hypotension as she continues her lifestyle modifications. She will continue her medications as directed.  Class 1 obesity with serious comorbidity and body mass index (BMI) of 30.0 to 30.9 in adult, unspecified obesity type.   [name] is currently in the action stage of change. As such, her goal is to continue with weight loss efforts. She has agreed to the Category 1  Plan.   She will work on meal planning.  We reviewed with the patient labs from 04/18/2020 including Vitamin D, insulin, and thyroid panel.   Handout was given for Protein Equivalents.  Exercise goals: [name] will go back to the gym.  Behavioral modification strategies: increasing lean protein intake, decreasing simple carbohydrates, increasing vegetables, increasing water intake, decreasing eating out, no skipping meals, meal planning and cooking strategies and keeping healthy foods in the home.  [name] has agreed to follow-up with our clinic in 2 weeks. She was informed of the importance of frequent follow-up visits to maximize her success with intensive lifestyle modifications for her multiple health conditions.   Objective:   Blood pressure 111/78, pulse 88, temperature 98.7 F (37.1 C), height 5' (1.524 m), weight 157 lb (71.2 kg), SpO2 99 %. Body mass index is 30.66 kg/m.  General: Cooperative, alert, well developed, in no acute distress. HEENT: Conjunctivae and lids unremarkable. Cardiovascular: Regular rhythm.  Lungs: Normal work of breathing. Neurologic: No focal deficits.   Lab Results  Component Value Date   CREATININE 0.63 01/25/2020   BUN 7 01/25/2020   NA 139 01/25/2020   K 4.1 01/25/2020   CL 108 (H) 01/25/2020   CO2 19 (L) 01/25/2020   Lab Results  Component Value Date   ALT 16 01/25/2020   AST 16 01/25/2020   ALKPHOS 76 01/25/2020   BILITOT 0.3 01/25/2020   Lab Results  Component Value Date   HGBA1C 5.3 01/25/2020   Lab Results  Component Value Date   INSULIN  8.4 04/18/2020   Lab Results  Component Value Date   TSH 2.790 04/18/2020   Lab Results  Component Value Date   CHOL 120 01/25/2020   HDL 28 (L) 01/25/2020   LDLCALC 66 01/25/2020   TRIG 150 (H) 01/25/2020   CHOLHDL 4.3 01/25/2020   Lab Results  Component Value Date   WBC 9.1 01/25/2020   HGB 14.8 01/25/2020   HCT 44.6 01/25/2020   MCV 91 01/25/2020   PLT 262 01/25/2020   No  results found for: IRON, TIBC, FERRITIN  Attestation Statements:   Reviewed by clinician on day of visit: allergies, medications, problem list, medical history, surgical history, family history, social history, and previous encounter notes.  Time spent on visit including pre-visit chart review and post-visit charting and care was 30 minutes.   Migdalia Dk, am acting as Location manager for [employer], DO   I have reviewed the above documentation for accuracy and completeness, and I agree with the above. Jearld Lesch, DO

## 2020-05-10 DIAGNOSIS — M359 Systemic involvement of connective tissue, unspecified: Principal | ICD-10-CM

## 2020-05-10 MED ORDER — HYDROXYCHLOROQUINE 200 MG TABLET
ORAL_TABLET | 11 refills | 0 days
Start: 2020-05-10 — End: ?

## 2020-05-11 ENCOUNTER — Encounter
Payer: Medicaid Other | Attending: Physical Medicine and Rehabilitation | Admitting: Physical Medicine and Rehabilitation

## 2020-05-11 ENCOUNTER — Encounter: Payer: Self-pay | Admitting: Physical Medicine and Rehabilitation

## 2020-05-11 ENCOUNTER — Other Ambulatory Visit: Payer: Self-pay

## 2020-05-11 VITALS — Temp 97.7°F | Ht 60.0 in | Wt 165.0 lb

## 2020-05-11 DIAGNOSIS — M5442 Lumbago with sciatica, left side: Secondary | ICD-10-CM | POA: Insufficient documentation

## 2020-05-11 DIAGNOSIS — M5136 Other intervertebral disc degeneration, lumbar region: Secondary | ICD-10-CM | POA: Insufficient documentation

## 2020-05-11 DIAGNOSIS — M25511 Pain in right shoulder: Secondary | ICD-10-CM | POA: Diagnosis present

## 2020-05-11 DIAGNOSIS — R2689 Other abnormalities of gait and mobility: Secondary | ICD-10-CM | POA: Insufficient documentation

## 2020-05-11 DIAGNOSIS — M5441 Lumbago with sciatica, right side: Secondary | ICD-10-CM | POA: Insufficient documentation

## 2020-05-11 DIAGNOSIS — M4120 Other idiopathic scoliosis, site unspecified: Secondary | ICD-10-CM | POA: Insufficient documentation

## 2020-05-11 DIAGNOSIS — G8929 Other chronic pain: Secondary | ICD-10-CM | POA: Diagnosis present

## 2020-05-11 DIAGNOSIS — Z79891 Long term (current) use of opiate analgesic: Secondary | ICD-10-CM | POA: Insufficient documentation

## 2020-05-11 DIAGNOSIS — Z5181 Encounter for therapeutic drug level monitoring: Secondary | ICD-10-CM | POA: Insufficient documentation

## 2020-05-11 DIAGNOSIS — M329 Systemic lupus erythematosus, unspecified: Secondary | ICD-10-CM | POA: Diagnosis present

## 2020-05-11 DIAGNOSIS — M797 Fibromyalgia: Secondary | ICD-10-CM | POA: Insufficient documentation

## 2020-05-11 DIAGNOSIS — M25512 Pain in left shoulder: Secondary | ICD-10-CM | POA: Diagnosis present

## 2020-05-11 NOTE — Progress Notes (Signed)
Subjective:    Patient ID: Erika STALLING, female    DOB: 12-Aug-1981, 39 y.o.   MRN: [id number]  HPI  [name] is a 39 year old woman who presents with diffuse joint pain. She has a history of fibromyalgia, Lupus, scoliosis, Sjogren's Disease, and lumbar disc disease.   She wants to go back to work and get off of disability. She had just graduated when she was diagnosed with Lupus in 2016. She was depressed at that time but feels much better now.   Her current pain is 7/10. The pain is achy. She has had hip injections that last about 6 months. She takes Hydrocodone 10mg /325mg  about 4 times per day. She tries heat and soaks in Epsom salt. She uses a heating pad. She has tried physical therapy, TENS unit. She has tried Lyrica and did not do well with this. She currently takes Gabapentin which does help with her nerve pain.   She follows with psychology and takes Vyvanse for ADHD. She recently decreased dose to 30mg  as it was causing her heart palpitations.   She has two children aged 77 and 15.    Pain Inventory Average Pain 7 Pain Right Now 5 My pain is aching  In the last 24 hours, has pain interfered with the following? General activity 10 Relation with others 0 Enjoyment of life 10 What TIME of day is your pain at its worst? morning, night Sleep (in general) Poor  Pain is worse with: bending, sitting, standing and some activites Pain improves with: medication Relief from Meds: 8  Mobility walk with assistance use a cane ability to climb steps?  no do you drive?  yes  Function disabled: date disabled .  Neuro/Psych weakness numbness tingling  Prior Studies new  Physicians involved in your care new   Family History  Problem Relation Age of Onset  . Heart disease Mother   . Diabetes Mother   . Hypertension Mother   . High Cholesterol Mother   . Thyroid disease Mother   . Depression Mother   . Obesity Mother   . Heart disease Father   . Diabetes Father     . Hypertension Father   . High Cholesterol Father   . Alcoholism Father   . Bipolar disorder Brother   . Cancer Brother        prostate  . Pancreatic cancer Maternal Grandmother   . Cancer Maternal Grandmother        pancreatic  . Cancer Maternal Uncle        lung  . Cancer Paternal Aunt   . Bipolar disorder Sister    Social History   Socioeconomic History  . Marital status: Married    Spouse name: Larkin Ina  . Number of children: Not on file  . Years of education: Not on file  . Highest education level: Not on file  Occupational History  . Occupation: stay at home mom  Tobacco Use  . Smoking status: Current Every Day Smoker    Packs/day: 1.00    Years: 11.00    Pack years: 11.00    Types: Cigarettes  . Smokeless tobacco: Never Used  . Tobacco comment: too much stress  Substance and Sexual Activity  . Alcohol use: No  . Drug use: No  . Sexual activity: Yes    Birth control/protection: Surgical  Other Topics Concern  . Not on file  Social History Narrative   At [employer] wanting to get apply to nursing school.  Plans to apply 2/13.     Social Determinants of Health   Financial Resource Strain:   . Difficulty of Paying Living Expenses:   Food Insecurity:   . Worried About Charity fundraiser in the Last Year:   . Arboriculturist in the Last Year:   Transportation Needs:   . Film/video editor (Medical):   Marland Kitchen Lack of Transportation (Non-Medical):   Physical Activity:   . Days of Exercise per Week:   . Minutes of Exercise per Session:   Stress:   . Feeling of Stress :   Social Connections:   . Frequency of Communication with Friends and Family:   . Frequency of Social Gatherings with Friends and Family:   . Attends Religious Services:   . Active Member of Clubs or Organizations:   . Attends Archivist Meetings:   Marland Kitchen Marital Status:    Past Surgical History:  Procedure Laterality Date  . ABDOMINAL HYSTERECTOMY  06/30/2008   partial  . AXILLARY  HIDRADENITIS EXCISION     bilateral  . CESAREAN SECTION  08/31/2006, 08/26/2007  . LAPAROSCOPIC ENDOMETRIOSIS FULGURATION  06/1999  . MASS EXCISION  06/19/2012   Procedure: EXCISION MASS;  Surgeon: Merrie Roof, MD;  Location: [place];  Service: General;  Laterality: Right;  excision mass right abdominal wall   Past Medical History:  Diagnosis Date  . Abdominal wall mass   . ADHD (attention deficit hyperactivity disorder)   . Anemia   . Arthritis   . Asthma   . Back pain   . Chest pain   . Depression    tried several meds, not on any now  . Diarrhea   . Endometriosis   . Headache(784.0)   . Hydradenitis   . Hydradenitis 07/13/2011  . Hypertension    on inderal  . Leukemia ([place]) at age 37  . Lupus ([place])   . Migraines   . MRSA (methicillin resistant staph aureus) culture positive 2010   ear infection that cultured out MRSA   . Nausea   . S/P total abdominal hysterectomy 06/26/2011  . Scoliosis   . Thyroid disease    Temp 97.7 F (36.5 C)   Ht 5' (1.524 m)   Wt 165 lb (74.8 kg)   BMI 32.22 kg/m   Opioid Risk Score:   Fall Risk Score:  `1  Depression screen PHQ 2/9  Depression screen [facility] 2/9 05/11/2020 04/18/2020 03/18/2020 05/06/2012  Decreased Interest 3 3 1 1   Down, Depressed, Hopeless 0 3 0 1  PHQ - 2 Score 3 6 1 2   Altered sleeping 3 3 1  -  Tired, decreased energy 3 3 3  -  Change in appetite 0 3 0 -  Feeling bad or failure about yourself  0 3 1 -  Trouble concentrating 3 3 1  -  Moving slowly or fidgety/restless 0 0 0 -  Suicidal thoughts 0 0 0 -  PHQ-9 Score 12 21 7  -  Difficult doing work/chores - Somewhat difficult - -    Review of Systems  Constitutional: Positive for chills and fever.  HENT: Negative.   Eyes: Negative.   Respiratory: Positive for shortness of breath and wheezing.   Cardiovascular: Positive for leg swelling.  Gastrointestinal: Positive for diarrhea, nausea and vomiting.  Endocrine: Negative.   Genitourinary: Negative.    Musculoskeletal: Positive for arthralgias, back pain and gait problem.  Allergic/Immunologic: Negative.   Neurological: Positive for weakness and numbness.  Tingling   All other systems reviewed and are negative.      Objective:   Physical Exam Gen: no distress, normal appearing HEENT: oral mucosa pink and moist, NCAT Cardio: Reg rate Chest: normal effort, normal rate of breathing Abd: soft, non-distended Ext: no edema Skin: intact Neuro: AOx3.  Musculoskeletal: Psych: pleasant, normal affect    Assessment & Plan:  [name] is a 39 year old woman who presents with diffuse joint pain. She has a history of fibromyalgia, Lupus, scoliosis, Sjogren's Disease, and lumbar disc disease.   Fibromyalgia There is presence of widespread pain above and below the waist on both sides for more than 3 months that is consistent with fibromyalgia. There are related symptoms of fatigue and cognitive dysfunction.   Recommended 30 minutes of aerobic exercise 5 times per week and resistance exercise of all major muscle groups at least twice per week. Educated patient that this is a crucial component of recovery and that self-responsibility is key. She is already very good about going to [employer] with her daughter 5x per week which is excellent!  Continue phsychology visits which appear to be greatly helping her.   Lumbar disc degeneration: -Appears to be a component of neuropathic pain in both legs, no evidence of radiculopathy or stenosis on 2016 MRI. Ordered repeat MRI to assess interval changes and determine if spinal injection would be of benefit.  -Prescribed physical therapy for core and paraspinal stretching and strengthening. -Pain contract and UDS signed today.   Quality of Life: -Continue to encourage return to work, which is one of patient's goals.

## 2020-05-11 NOTE — Unmapped (Signed)
[facility] Refill Coordination Note    Specialty Medication(s) to be Shipped:   Inflammatory Disorders: Humira    Other medication(s) to be shipped: n/a     Kim Charles, DOB: Jun 21, 1981  Phone: [phone] (home)       All above HIPAA information was verified with patient.     Was a Nurse, learning disability used for this call? No    Completed refill call assessment today to schedule patient's medication shipment from the [facility] [phone]).       Specialty medication(s) and dose(s) confirmed: Regimen is correct and unchanged.   Changes to medications: Kim Charles reports no changes at this time.  Changes to insurance: No  Questions for the pharmacist: No    Confirmed patient received Welcome Packet with first shipment. The patient will receive a drug information handout for each medication shipped and additional FDA Medication Guides as required.       DISEASE/MEDICATION-SPECIFIC INFORMATION        For patients on injectable medications: Patient currently has 1 doses left.  Next injection is scheduled for 05/11/2020.    SPECIALTY MEDICATION ADHERENCE     Medication Adherence    Patient reported X missed doses in the last month: 0  Specialty Medication: Humira CF 40 mg/0.4 ml  Patient is on additional specialty medications: No  Any gaps in refill history greater than 2 weeks in the last 3 months: no  Demonstrates understanding of importance of adherence: yes  Informant: patient  Reliability of informant: reliable  Confirmed plan for next specialty medication refill: delivery by pharmacy  Refills needed for supportive medications: not needed                Humira CF 40 mg/0.4 ml. 7 days on hand       SHIPPING     Shipping address confirmed in Epic.     Delivery Scheduled: Yes, Expected medication delivery date: 05/17/2020.     Medication will be delivered via UPS to the prescription address in Epic WAM.    Kim Charles   [facility] Specialty Technician

## 2020-05-13 ENCOUNTER — Telehealth: Payer: Self-pay | Admitting: *Deleted

## 2020-05-13 NOTE — Telephone Encounter (Signed)
Thank you Sybil!

## 2020-05-13 NOTE — Telephone Encounter (Signed)
[name] called to ask about her medication that her previous pain mgmnt doctor wrote for her.  She forgot to tell Dr. Ranell Patrick she has the Rx for Hysingla ER 20 mg #30 pre scribed 05/10/20  and she has signed a contract and does not want to do anything that would get her in trouble.  I have explained to her that yes she signed the agreement but we have not started prescribing for her yet and we typically tell new patients to continue with their medication from previous MD until we actually prescribe for her. We do not have results from UDS currently, so she may take her medication from Dr Mirna Mires until Dr Ranell Patrick decides to prescribe a controlled medication.

## 2020-05-16 ENCOUNTER — Ambulatory Visit: Admit: 2020-05-16 | Discharge: 2020-05-17 | Payer: MEDICAID | Attending: Dermatology | Primary: Dermatology

## 2020-05-16 MED ORDER — HYDROCORTISONE 2.5 % TOPICAL CREAM
TOPICAL | 1 refills | 0.00000 days | Status: CP
Start: 2020-05-16 — End: ?

## 2020-05-16 MED FILL — HUMIRA PEN CITRATE FREE 40 MG/0.4 ML: SUBCUTANEOUS | 28 days supply | Qty: 4 | Fill #1

## 2020-05-16 MED FILL — HUMIRA PEN CITRATE FREE 40 MG/0.4 ML: 28 days supply | Qty: 4 | Fill #1 | Status: AC

## 2020-05-16 NOTE — Unmapped (Addendum)
Hydrocortisone 2.5% cream 1-2 time a day as needed for redness on your face. Do not use for longer than one week     We placed a referral for laser at our office. They will reach out to you to schedule a consultation appointment. If you don't hear in 1 week, please call us back.     Sunscreen 30 SPF broad spectrum daily.    - sun block >30 SPF (ISDIN, Elta MD - clear, Aveeno, Vanicream, Cetaphil)     Patient Education        hydrocortisone topical  Pronunciation:  hye droe KOR ti sone  Brand:  Ala-Cort, Aquanil HC, Beta HC, Cortizone-5, Dermarest Plus Anti-Itch, Dermtex HC, Gynecort Maximum Strength, Instacort, Itch-X Lotion, Locoid, Pandel, Sarnol-HC  What is the most important information I should know about hydrocortisone topical?  Follow all directions on your medicine label and package. Tell each of your healthcare providers about all your medical conditions, allergies, and all medicines you use.  What is hydrocortisone topical?  Hydrocortisone topical (for the skin) is a steroid that is used to treat inflammation and itching caused by skin conditions that respond to steroid medication.  There are many brands and forms of hydrocortisone topical available. Not all brands are listed on this leaflet.  Hydrocortisone topical may also be used for purposes not listed in this medication guide.  What should I discuss with my healthcare provider before using hydrocortisone topical?  You should not use hydrocortisone topical if you are allergic to it.  Tell your doctor if you have ever had:  ?? any type of skin infection;  ?? a skin reaction to any steroid medicine;  ?? liver disease; or  ?? an adrenal gland disorder.  Steroid medicines can increase the glucose (sugar) levels in your blood or urine. Tell your doctor if you have diabetes.  Do not give this medicine to a child younger than 4 years old without medical advice.  Ask a doctor before using this medicine if you are pregnant or breastfeeding.   If you apply hydrocortisone topical to your chest, avoid areas that may come into contact with the baby's mouth.  How should I use hydrocortisone topical?  Use exactly as directed on the label, or as prescribed by your doctor.  Do not take by mouth. Topical medicine is for use only on the skin.  Read and carefully follow any Instructions for Use provided with your medicine. Ask your doctor or pharmacist if you do not understand these instructions.  Wash your hands before and after using hydrocortisone, unless you are using this medicine to treat the skin on your hands.  Apply a thin layer of medicine to the affected skin and rub it in gently. Do not apply this medicine over a large area of skin unless your doctor has told you to.  Do not cover the treated skin area with a bandage or other covering unless your doctor tells you to. Covering treated areas can increase the amount of medicine absorbed through your skin and may cause harmful effects.  Shake hydrocortisone lotion and spay well just before each use.  If you are treating the diaper area, do not use plastic pants or tight-fitting diapers.  Stop using hydrocortisone and call your doctor if your symptoms do not improve, or if they get worse within 7 days.  Store at room temperature away from moisture and heat. Do not freeze. Keep the bottle tightly closed when not in use.  What happens if I  miss a dose?  Apply the medicine as soon as you can, but skip the missed dose if it is almost time for your next dose. Do not apply two doses at one time.  What happens if I overdose?  Seek emergency medical attention or call the Poison Help line at [phone] if anyone has accidentally swallowed the medication.  High doses or long-term use of hydrocortisone topical can lead to thinning skin, easy bruising, changes in body fat (especially in your face, neck, back, and waist), increased acne or facial hair, menstrual problems, impotence, or loss of interest in sex.  What should I avoid while using hydrocortisone topical?  Do not use hydrocortisone topical to treat any skin condition that has not been checked by your doctor.  Do not get this medicine in your eyes.  If contact does occur, rinse with water.  What are the possible side effects of hydrocortisone topical?  Get emergency medical help if you have signs of an allergic reaction: hives; difficult breathing; swelling of your face, lips, tongue, or throat.  Call your doctor at once if you have:  ?? worsening of your skin condition;  ?? redness, warmth, swelling, oozing, or severe irritation of any treated skin;  ?? high blood sugar --increased thirst, increased urination, dry mouth, fruity breath odor; or  ?? possible signs of absorbing this medicine through your skin --weight gain (especially in your face or your upper back and torso), slow wound healing, thinning or discolored skin, increased body hair, muscle weakness, nausea, diarrhea, tiredness, mood changes, menstrual changes, sexual changes.  Less serious side effects may be more likely, and you may have none at all.  This is not a complete list of side effects and others may occur. Call your doctor for medical advice about side effects. You may report side effects to FDA at 1-800-FDA-1088.  What other drugs will affect hydrocortisone topical?  Medicine used on the skin is not likely to be affected by other drugs you use. But many drugs can interact with each other. Tell each of your healthcare providers about all medicines you use, including prescription and over-the-counter medicines, vitamins, and herbal products.  Where can I get more information?  Your pharmacist can provide more information about hydrocortisone topical.  Remember, keep this and all other medicines out of the reach of children, never share your medicines with others, and use this medication only for the indication prescribed.   Every effort has been made to ensure that the information provided by [employer] ('[employer]') is accurate, up-to-date, and complete, but no guarantee is made to that effect. Drug information contained herein may be time sensitive. [employer] information has been compiled for use by healthcare practitioners and consumers in the [place] and therefore [employer] does not warrant that uses outside of the [place] are appropriate, unless specifically indicated otherwise. [employer]'s drug information does not endorse drugs, diagnose patients or recommend therapy. [employer]'s drug information is an [employer], corporate to assist licensed healthcare practitioners in caring for their patients and/or to serve consumers viewing this service as a supplement to, and not a substitute for, the expertise, skill, knowledge and judgment of healthcare practitioners. The absence of a warning for a given drug or drug combination in no way should be construed to indicate that the drug or drug combination is safe, effective or appropriate for any given patient. [employer] does not assume any responsibility for any aspect of healthcare administered with the aid of information [employer] provides. The  information contained herein is not intended to cover all possible uses, directions, precautions, warnings, drug interactions, allergic reactions, or adverse effects. If you have questions about the drugs you are taking, check with your doctor, nurse or pharmacist.  Copyright [phone] [employer] Version: 11.01. Revision date: 01/12/2019.  Care instructions adapted under license by [facility]. If you have questions about a medical condition or this instruction, always ask your healthcare professional. [employer] disclaims any warranty or liability for your use of this information.

## 2020-05-16 NOTE — Unmapped (Signed)
Dermatology Note     Assessment and Plan:      Polymorphic light eruption  - given quick (~20 minutes) flare after sun exposure, favor PMLE over ACLE, but the latter remains a possibility.   - discussed importance of ceasing smoking and its role in the disease process for PMLE and especially ACLE/SLE  - discussed the importance of sun protection   - hydrocortisone 2.5 % cream; 1-2 times a day on the red areas on the face for 1 week straight maximum. - Discussed use of Elidel in the future, but medicaid required failure of a topical steroid.     Scars- 2/2 to Hidradenitis Suppurativa   - Patient requested a referral for laser treatment -placed   - Sent a message to [name]     The patient was advised to call for an appointment should any new, changing, or symptomatic lesions develop.     RTC: Return if symptoms worsen or fail to improve. or sooner as needed - referral for laser placed   _________________________________________________________________      Chief Complaint     Chief Complaint   Patient presents with   ??? Rash     on her nose its been there for a few weeks it dont bother her        HPI     Kim Charles is a 39 y.o. female who presents as a returning patient (last seen 06/29/2019) to [facility] for a focused skin exam. Patient reports a rash.    Duration: 2-3 weeks   Location/pattern: sides of her nose and made worse with sun exposure, flared with in ~20 minutes of sun exposure   Symptoms: none .   New body product/laundry/household: none  New systemic medications: none  Treatments tried: none     The patient is also interested in laser treatment for the scars under her armpit.     The patient denies any other new or changing lesions or areas of concern.     Pertinent Past Medical History     No history of skin cancer    Family History:   Negative for melanoma    Past Medical History, Family History, Social History, Medication List, Allergies, and Problem List were reviewed in the rooming section of Epic.     ROS: Other than symptoms mentioned in the HPI, no fevers, chills, or other skin complaints    Physical Examination     GENERAL: Well-appearing Fitzpatrick skin type II female in no acute distress, resting comfortably.  NEURO: Alert and oriented, answers questions appropriately  RESP: No increased work of breathing  SKIN (Focal Skin Exam): Per patient request, examination of face and right axilla  was performed  Red flat indurated macule on the right and left nasal side wall   - right axilla with circular hypertrophic flesh colored scars      All areas not commented on are within normal limits or unremarkable      (Approved Template 05/13/2020)

## 2020-05-18 LAB — TOXASSURE SELECT,+ANTIDEPR,UR

## 2020-05-19 ENCOUNTER — Encounter (INDEPENDENT_AMBULATORY_CARE_PROVIDER_SITE_OTHER): Payer: Self-pay | Admitting: Bariatrics

## 2020-05-19 ENCOUNTER — Ambulatory Visit (INDEPENDENT_AMBULATORY_CARE_PROVIDER_SITE_OTHER): Payer: Medicaid Other | Admitting: Bariatrics

## 2020-05-19 ENCOUNTER — Other Ambulatory Visit: Payer: Self-pay

## 2020-05-19 VITALS — BP 103/71 | HR 77 | Temp 98.3°F | Ht 60.0 in | Wt 159.0 lb

## 2020-05-19 DIAGNOSIS — Z6831 Body mass index (BMI) 31.0-31.9, adult: Secondary | ICD-10-CM

## 2020-05-19 DIAGNOSIS — I10 Essential (primary) hypertension: Secondary | ICD-10-CM | POA: Diagnosis not present

## 2020-05-19 DIAGNOSIS — E6609 Other obesity due to excess calories: Secondary | ICD-10-CM | POA: Diagnosis not present

## 2020-05-19 DIAGNOSIS — E669 Obesity, unspecified: Secondary | ICD-10-CM | POA: Diagnosis not present

## 2020-05-19 DIAGNOSIS — E559 Vitamin D deficiency, unspecified: Secondary | ICD-10-CM | POA: Diagnosis not present

## 2020-05-19 DIAGNOSIS — Z6830 Body mass index (BMI) 30.0-30.9, adult: Secondary | ICD-10-CM

## 2020-05-19 MED ORDER — VITAMIN D (ERGOCALCIFEROL) 1.25 MG (50000 UNIT) PO CAPS: 50000.0000 [IU] | ORAL_CAPSULE | ORAL | 0 refills | Status: DC

## 2020-05-23 ENCOUNTER — Encounter (INDEPENDENT_AMBULATORY_CARE_PROVIDER_SITE_OTHER): Payer: Self-pay | Admitting: Bariatrics

## 2020-05-23 DIAGNOSIS — Z6830 Body mass index (BMI) 30.0-30.9, adult: Secondary | ICD-10-CM | POA: Insufficient documentation

## 2020-05-23 DIAGNOSIS — E6609 Other obesity due to excess calories: Secondary | ICD-10-CM | POA: Insufficient documentation

## 2020-05-23 NOTE — Progress Notes (Signed)
Chief Complaint:   OBESITY [name] is here to discuss her progress with her obesity treatment plan along with follow-up of her obesity related diagnoses. [name] is on the Category 1 Plan and states she is following her eating plan approximately 75% of the time. [name] states she is walking 30 minutes 7 times per week.  Today's visit was #: 3 Starting weight: 166 lbs Starting date: 04/18/2020 Today's weight: 159 lbs Today's date: 05/19/2020 Total lbs lost to date: 7 Total lbs lost since last in-office visit: 0  Interim History: [name] is up 2 lbs. She reports she has been more stressed.  Subjective:   Vitamin D deficiency. Last Vitamin D 27.0 on 04/18/2020.  Essential hypertension. Blood pressure is well controlled.  BP Readings from Last 3 Encounters:  05/19/20 103/71  05/02/20 111/78  04/18/20 115/84   Lab Results  Component Value Date   CREATININE 0.63 01/25/2020   CREATININE 0.69 01/08/2019   CREATININE 0.69 11/28/2017   Assessment/Plan:   Vitamin D deficiency. Low Vitamin D level contributes to fatigue and are associated with obesity, breast, and colon cancer. She was given a prescription for Vitamin D, Ergocalciferol, (DRISDOL) 1.25 MG (50000 UNIT) CAPS capsule every week #4 with 0 reflls and will follow-up for routine testing of Vitamin D, at least 2-3 times per year to avoid over-replacement.    Essential hypertension. [name] is working on healthy weight loss and exercise to improve blood pressure control. We will watch for signs of hypotension as she continues her lifestyle modifications. She will continue her medication as directed.  Class 1 obesity with serious comorbidity and body mass index (BMI) of 31.0 to 31.9 in adult, unspecified obesity type.  [name] is currently in the action stage of change. As such, her goal is to continue with weight loss efforts. She has agreed to the Category 1 Plan.   She will work on meal planning, intentional eating,  and increasing her water and protein intake.  Exercise goals: [name] will switch to a "Burn" class.  Behavioral modification strategies: increasing lean protein intake, decreasing simple carbohydrates, increasing vegetables, increasing water intake, decreasing eating out, no skipping meals, meal planning and cooking strategies, keeping healthy foods in the home and planning for success.  [name] has agreed to follow-up with our clinic in 4 weeks. She was informed of the importance of frequent follow-up visits to maximize her success with intensive lifestyle modifications for her multiple health conditions.   Objective:   Blood pressure 103/71, pulse 77, temperature 98.3 F (36.8 C), height 5' (1.524 m), weight 159 lb (72.1 kg), SpO2 100 %. Body mass index is 31.05 kg/m.  General: Cooperative, alert, well developed, in no acute distress. HEENT: Conjunctivae and lids unremarkable. Cardiovascular: Regular rhythm.  Lungs: Normal work of breathing. Neurologic: No focal deficits.   Lab Results  Component Value Date   CREATININE 0.63 01/25/2020   BUN 7 01/25/2020   NA 139 01/25/2020   K 4.1 01/25/2020   CL 108 (H) 01/25/2020   CO2 19 (L) 01/25/2020   Lab Results  Component Value Date   ALT 16 01/25/2020   AST 16 01/25/2020   ALKPHOS 76 01/25/2020   BILITOT 0.3 01/25/2020   Lab Results  Component Value Date   HGBA1C 5.3 01/25/2020   Lab Results  Component Value Date   INSULIN 8.4 04/18/2020   Lab Results  Component Value Date   TSH 2.790 04/18/2020   Lab Results  Component Value Date  CHOL 120 01/25/2020   HDL 28 (L) 01/25/2020   LDLCALC 66 01/25/2020   TRIG 150 (H) 01/25/2020   CHOLHDL 4.3 01/25/2020   Lab Results  Component Value Date   WBC 9.1 01/25/2020   HGB 14.8 01/25/2020   HCT 44.6 01/25/2020   MCV 91 01/25/2020   PLT 262 01/25/2020   No results found for: IRON, TIBC, FERRITIN  Attestation Statements:   Reviewed by clinician on day of visit:  allergies, medications, problem list, medical history, surgical history, family history, social history, and previous encounter notes.  Time spent on visit including pre-visit chart review and post-visit charting and care was 20 minutes.   Migdalia Dk, am acting as Location manager for [employer], DO   I have reviewed the above documentation for accuracy and completeness, and I agree with the above. Jearld Lesch, DO

## 2020-05-25 ENCOUNTER — Encounter: Payer: Self-pay | Admitting: Physical Medicine and Rehabilitation

## 2020-05-25 ENCOUNTER — Encounter: Payer: Medicaid Other | Admitting: Physical Medicine and Rehabilitation

## 2020-05-25 ENCOUNTER — Other Ambulatory Visit: Payer: Self-pay

## 2020-05-25 VITALS — BP 100/79 | HR 79 | Temp 97.7°F | Ht 62.0 in | Wt 164.0 lb

## 2020-05-25 DIAGNOSIS — R2689 Other abnormalities of gait and mobility: Secondary | ICD-10-CM

## 2020-05-25 DIAGNOSIS — M5442 Lumbago with sciatica, left side: Secondary | ICD-10-CM

## 2020-05-25 DIAGNOSIS — M51369 Other intervertebral disc degeneration, lumbar region without mention of lumbar back pain or lower extremity pain: Secondary | ICD-10-CM

## 2020-05-25 DIAGNOSIS — G8929 Other chronic pain: Secondary | ICD-10-CM

## 2020-05-25 DIAGNOSIS — M5441 Lumbago with sciatica, right side: Secondary | ICD-10-CM

## 2020-05-25 DIAGNOSIS — M25511 Pain in right shoulder: Secondary | ICD-10-CM

## 2020-05-25 DIAGNOSIS — M5136 Other intervertebral disc degeneration, lumbar region: Secondary | ICD-10-CM

## 2020-05-25 DIAGNOSIS — M25512 Pain in left shoulder: Secondary | ICD-10-CM

## 2020-05-25 DIAGNOSIS — M797 Fibromyalgia: Secondary | ICD-10-CM

## 2020-05-25 MED ORDER — AMLODIPINE BESYLATE 5 MG PO TABS: 5.0000 mg | ORAL_TABLET | Freq: Every day | ORAL | 3 refills | Status: DC

## 2020-05-25 NOTE — Progress Notes (Signed)
Subjective:    Patient ID: [name], female    DOB: [birth year], 39 y.o.   MRN: [id number]  HPI  Since last visit, she fell on her back twice after doing the monkey bars. She currently has enough pain medications. She brings in a letter from her prior clinic that says they will no longer be prescribing narcotics.   She is going to [facility] with her husband in July.  She will be starting her physical in June.  She continues to have lower back pain that radiates into her bilateral legs.  She has never had spinal injections in the past.   5/12 Initial history: [name] is a 39 year old woman who presents with diffuse joint pain. She has a history of fibromyalgia, Lupus, scoliosis, Sjogren's Disease, and lumbar disc disease.   She wants to go back to work and get off of disability. She had just graduated when she was diagnosed with Lupus in 2016. She was depressed at that time but feels much better now.   Her current pain is 7/10. The pain is achy. She has had hip injections that last about 6 months. She takes Hydrocodone 10mg /325mg  about 4 times per day. She tries heat and soaks in Epsom salt. She uses a heating pad. She has tried physical therapy, TENS unit. She has tried Lyrica and did not do well with this. She currently takes Gabapentin which does help with her nerve pain.   She follows with psychology and takes Vyvanse for ADHD. She recently decreased dose to 30mg  as it was causing her heart palpitations.   She has two children aged 46 and 17.   Pain Inventory Average Pain 5 Pain Right Now 5 My pain is dull, tingling and aching  In the last 24 hours, has pain interfered with the following? General activity 5 Relation with others 0 Enjoyment of life 5 What TIME of day is your pain at its worst? morning and evening Sleep (in general) Fair  Pain is worse with: bending, sitting, standing and some activites Pain improves with: medication Relief from Meds:  8  Mobility walk without assistance walk with assistance use a cane ability to climb steps?  yes do you drive?  yes  Function disabled: date disabled .  Neuro/Psych numbness tingling  Prior Studies Any changes since last visit?  no  Physicians involved in your care Any changes since last visit?  no   Family History  Problem Relation Age of Onset  . Heart disease Mother   . Diabetes Mother   . Hypertension Mother   . High Cholesterol Mother   . Thyroid disease Mother   . Depression Mother   . Obesity Mother   . Heart disease Father   . Diabetes Father   . Hypertension Father   . High Cholesterol Father   . Alcoholism Father   . Bipolar disorder Brother   . Cancer Brother        prostate  . Pancreatic cancer Maternal Grandmother   . Cancer Maternal Grandmother        pancreatic  . Cancer Maternal Uncle        lung  . Cancer Paternal Aunt   . Bipolar disorder Sister    Social History   Socioeconomic History  . Marital status: Married    Spouse name: Larkin Ina  . Number of children: Not on file  . Years of education: Not on file  . Highest education level: Not on file  Occupational History  . Occupation: stay at home mom  Tobacco Use  . Smoking status: Current Every Day Smoker    Packs/day: 1.00    Years: 11.00    Pack years: 11.00    Types: Cigarettes  . Smokeless tobacco: Never Used  . Tobacco comment: too much stress  Substance and Sexual Activity  . Alcohol use: No  . Drug use: No  . Sexual activity: Yes    Birth control/protection: Surgical  Other Topics Concern  . Not on file  Social History Narrative   At [employer] wanting to get apply to nursing school.  Plans to apply 2/13.     Social Determinants of Health   Financial Resource Strain:   . Difficulty of Paying Living Expenses:   Food Insecurity:   . Worried About Charity fundraiser in the Last Year:   . Arboriculturist in the Last Year:   Transportation Needs:   . Lexicographer (Medical):   Marland Kitchen Lack of Transportation (Non-Medical):   Physical Activity:   . Days of Exercise per Week:   . Minutes of Exercise per Session:   Stress:   . Feeling of Stress :   Social Connections:   . Frequency of Communication with Friends and Family:   . Frequency of Social Gatherings with Friends and Family:   . Attends Religious Services:   . Active Member of Clubs or Organizations:   . Attends Archivist Meetings:   Marland Kitchen Marital Status:    Past Surgical History:  Procedure Laterality Date  . ABDOMINAL HYSTERECTOMY  06/30/2008   partial  . AXILLARY HIDRADENITIS EXCISION     bilateral  . CESAREAN SECTION  08/31/2006, 08/26/2007  . LAPAROSCOPIC ENDOMETRIOSIS FULGURATION  06/1999  . MASS EXCISION  06/19/2012   Procedure: EXCISION MASS;  Surgeon: Merrie Roof, MD;  Location: [facility];  Service: General;  Laterality: Right;  excision mass right abdominal wall   Past Medical History:  Diagnosis Date  . Abdominal wall mass   . ADHD (attention deficit hyperactivity disorder)   . Anemia   . Arthritis   . Asthma   . Back pain   . Chest pain   . Depression    tried several meds, not on any now  . Diarrhea   . Endometriosis   . Headache(784.0)   . Hydradenitis   . Hydradenitis 07/13/2011  . Hypertension    on inderal  . Leukemia ([place]) at age 87  . Lupus ([place])   . Migraines   . MRSA (methicillin resistant staph aureus) culture positive 2010   ear infection that cultured out MRSA   . Nausea   . S/P total abdominal hysterectomy 06/26/2011  . Scoliosis   . Thyroid disease    There were no vitals taken for this visit.  Opioid Risk Score:   Fall Risk Score:  `1  Depression screen PHQ 2/9  Depression screen [facility] 2/9 05/11/2020 04/18/2020 03/18/2020 05/06/2012  Decreased Interest 3 3 1 1   Down, Depressed, Hopeless 0 3 0 1  PHQ - 2 Score 3 6 1 2   Altered sleeping 3 3 1  -  Tired, decreased energy 3 3 3  -  Change in appetite 0 3 0 -  Feeling  bad or failure about yourself  0 3 1 -  Trouble concentrating 3 3 1  -  Moving slowly or fidgety/restless 0 0 0 -  Suicidal thoughts 0 0 0 -  PHQ-9 Score  12 21 7  -  Difficult doing work/chores - Somewhat difficult - -    Review of Systems  Constitutional: Negative.   HENT: Negative.   Eyes: Negative.   Respiratory: Negative.   Endocrine: Negative.   Genitourinary: Negative.   Musculoskeletal: Positive for arthralgias and back pain.  Allergic/Immunologic: Negative.   Neurological: Positive for numbness.       Tingling  All other systems reviewed and are negative.      Objective:   Physical Exam Gen: no distress, normal appearing HEENT: oral mucosa pink and moist, NCAT Cardio: Reg rate Chest: normal effort, normal rate of breathing Abd: soft, non-distended Ext: no edema Skin: intact Neuro: AOx3.  Musculoskeletal: Tenderness to palpation of bilateral shoulders. Tenderness to palpation of bilateral lumbar paraspinals.  Psych: pleasant, normal affect     Assessment & Plan:  [name] is a 39 year old woman who presents with diffuse joint pain. She has a history of fibromyalgia, Lupus, scoliosis, Sjogren's Disease, and lumbar disc disease.    Fibromyalgia There is presence of widespread pain above and below the waist on both sides for more than 3 months that is consistent with fibromyalgia. There are related symptoms of fatigue and cognitive dysfunction.    Recommended 30 minutes of aerobic exercise 5 times per week and resistance exercise of all major muscle groups at least twice per week. Educated patient that this is a crucial component of recovery and that self-responsibility is key. She is already very good about going to [employer] with her daughter 5x per week which is excellent!   Continue phsychology visits which appear to be greatly helping her.    Lumbar disc degeneration: -Appears to be a component of neuropathic pain in both legs, no evidence of  radiculopathy or stenosis on 2016 MRI. Ordered lumbar XR to assess for stenosis since it has been 5 years since last imaging.  -Prescribed physical therapy for core and paraspinal stretching and strengthening. Starting in June.  -Pain contract and UDS signed first visit.     Quality of Life: -Continue to encourage return to work, which is one of patient's goals.   Hypotension: -Decreased amlodipine to 5mg . Provided script. Log pressures and bring to f/u appointment.  Vitamin D deficiency: continue 50,000U weekly. Advised that deficiency can also contribute to pain.  Bilateral shoulder pain: Ordered XRs to assess for arthritis.   All questions answered. RTC in 1 month.

## 2020-05-26 ENCOUNTER — Telehealth: Payer: Self-pay | Admitting: *Deleted

## 2020-05-26 ENCOUNTER — Ambulatory Visit
Admission: RE | Admit: 2020-05-26 | Discharge: 2020-05-26 | Disposition: A | Discharge status: Home / Self Care | Payer: Medicaid Other | Source: Ambulatory Visit | Attending: Physical Medicine and Rehabilitation | Admitting: Physical Medicine and Rehabilitation

## 2020-05-26 DIAGNOSIS — M5442 Lumbago with sciatica, left side: Secondary | ICD-10-CM

## 2020-05-26 DIAGNOSIS — M25511 Pain in right shoulder: Secondary | ICD-10-CM

## 2020-05-26 DIAGNOSIS — M25512 Pain in left shoulder: Secondary | ICD-10-CM

## 2020-05-26 DIAGNOSIS — G8929 Other chronic pain: Secondary | ICD-10-CM

## 2020-05-26 NOTE — Telephone Encounter (Signed)
Urine drug screen for this encounter is consistent for prescribed medication 

## 2020-05-31 ENCOUNTER — Other Ambulatory Visit: Payer: Self-pay

## 2020-05-31 ENCOUNTER — Ambulatory Visit: Payer: Medicaid Other | Attending: Physical Medicine and Rehabilitation | Admitting: Physical Therapy

## 2020-05-31 DIAGNOSIS — R209 Unspecified disturbances of skin sensation: Secondary | ICD-10-CM | POA: Insufficient documentation

## 2020-05-31 DIAGNOSIS — M797 Fibromyalgia: Secondary | ICD-10-CM | POA: Diagnosis present

## 2020-05-31 DIAGNOSIS — M5441 Lumbago with sciatica, right side: Secondary | ICD-10-CM | POA: Insufficient documentation

## 2020-05-31 DIAGNOSIS — M6281 Muscle weakness (generalized): Secondary | ICD-10-CM | POA: Diagnosis present

## 2020-05-31 DIAGNOSIS — G8929 Other chronic pain: Secondary | ICD-10-CM

## 2020-05-31 DIAGNOSIS — R293 Abnormal posture: Secondary | ICD-10-CM | POA: Diagnosis present

## 2020-05-31 DIAGNOSIS — M5440 Lumbago with sciatica, unspecified side: Secondary | ICD-10-CM | POA: Diagnosis present
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
## 2020-05-31 NOTE — Patient Instructions (Signed)
   Voncille Lo, PT Certified Exercise Expert for the Aging Adult  05/31/20 2:13 PM Phone: [phone] Fax: [phone]

## 2020-06-06 NOTE — Unmapped (Signed)
VM from [facility] asking if she can shave the area she is having lasered on Wednesday. Will send laser instructions via MyChart.

## 2020-06-07 NOTE — Unmapped (Signed)
Error

## 2020-06-08 ENCOUNTER — Ambulatory Visit: Admit: 2020-06-08 | Discharge: 2020-06-09 | Payer: MEDICAID

## 2020-06-08 DIAGNOSIS — L732 Hidradenitis suppurativa: Principal | ICD-10-CM

## 2020-06-08 DIAGNOSIS — Z79899 Other long term (current) drug therapy: Principal | ICD-10-CM

## 2020-06-08 DIAGNOSIS — L932 Other local lupus erythematosus: Principal | ICD-10-CM

## 2020-06-08 MED ORDER — FLUOCINONIDE 0.05 % TOPICAL CREAM
Freq: Two times a day (BID) | TOPICAL | 1 refills | 0.00000 days | Status: CP
Start: 2020-06-08 — End: 2021-06-08

## 2020-06-08 MED ORDER — HUMIRA PEN CITRATE FREE 40 MG/0.4 ML
SUBCUTANEOUS | 4 refills | 28.00000 days | Status: CP
Start: 2020-06-08 — End: ?
  Filled 2020-06-15: qty 4, 28d supply, fill #0

## 2020-06-08 NOTE — Unmapped (Signed)
Dermatology Note     Assessment and Plan:      Hidradenitis Suppurativa (HS):   Hurley Stage II: one or more widely separated recurrent abscesses, with sinus tract and scar formation  Location: axillae, intermammary regions, groin  - Previously discussed the chronic, relapsing nature of this skin disease, characterized by recurring inflamed painful nodules with abscess and sinus formation, and scarring  - S/p unroofing of 3 sites in left axilla, 1 site in right axillae, 1 in right inguinal area, and 1 in left inguinal area 06/2019.  - She is followed by Pain Management, Dr. Alwyn Ren in [place], [place].  - Reviewed that smoking cessation may help improve HS outcomes.  - Continue Humira 40 mg weekly. Previously had discussed Humira with her rheumatologist given her history of SLE and Sjogren's and he was okay with starting. Currently also being treated with methotrexate and plaquenil.     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  - Increase Hydrocortisone 2.5% cream to Lidex 0.05% cream BID PRN for up to 2 weeks at a time.      The patient was advised to call for an appointment should any new, changing, or symptomatic lesions develop.     RTC: Return in about 6 weeks (around 07/20/2020) for Alex laser for HS. or sooner as needed   _________________________________________________________________      Chief Complaint     Chief Complaint   Patient presents with   ??? Laser Treatment     done on her groin and under her arms        HPI     Kim Charles is a 39 y.o. female who presents as a returning patient (last seen 05/16/2020) to [facility] for follow up of HS and to begin laser therapy.     Patient reports HS has been fairly stable on Humira 40 mg weekly. She continues to have an occasional flare in groin or underarms. She has done well after unroofing of 3 sites in left axilla, 1 site in right axillae, 1 in right inguinal area, and 1 in left inguinal area 06/2019. She would like to proceed with laser therapy today.    She reports rash on her face has been persistent. Flares with sun exposure and not responding well to Hydrocortisone cream.     The patient denies any other new or changing lesions or areas of concern.     Pertinent Past Medical History     No history of skin cancer    Problem List        Musculoskeletal and Integument    Hidradenitis suppurativa - Primary    Relevant Medications    fluocinonide (LIDEX) 0.05 % cream          Family History:   Negative for melanoma    Past Medical History, Family History, Social History, Medication List, Allergies, and Problem List  were reviewed in the rooming section of Epic.     ROS: Other than symptoms mentioned in the HPI, no fevers, chills, or other skin complaints    Physical Examination     GENERAL: Well-appearing Fitzpatrick skin type II female in no acute distress, resting comfortably.  NEURO: Alert and oriented, answers questions appropriately  SKIN (Focal Skin Exam): Per patient request, examination of head, neck, axillae, groin, thighs, mons pubis, vulva, Genitalia Exam was performed    location Abscess Inflamed nodule Non-inflamed nodule Draining sinus Non-draining Sinus Hurley % scar   R axilla  1        L axilla          R inframammary          L inframammary          Intermammary          Pubic          R inguinal     1     R thigh          L inguinal          L thigh Scrotum/Vulva     1     Perianal          R buttock          L buttock          Other (list)                      AN count (total sum of abscess and inflammatory nodule): 1  Many intertriginous comedones, few diffuse  Facial acne scars, none on trunk  No unique scarring pattern  Scarring folliculitis phenotype  3-10 intertriginous cysts, 3-10 diffuse  -Few pink smooth papules and plaques on R cheek.  -Well healed scars s/p unroofing procedures.  All areas not commented on are within normal limits or unremarkable  - female chaperone present      (Approved Template 05/20/2020)

## 2020-06-08 NOTE — Unmapped (Signed)
[facility] Refill Coordination Note    Specialty Medication(s) to be Shipped:   Inflammatory Disorders: Humira    Other medication(s) to be shipped: n/a     Kim Charles, DOB: [birth year]  Phone: [phone] (home)       All above HIPAA information was verified with patient.     Was a Nurse, learning disability used for this call? No    Completed refill call assessment today to schedule patient's medication shipment from the [facility] [phone]).       Specialty medication(s) and dose(s) confirmed: Regimen is correct and unchanged.   Changes to medications: Kim Charles reports no changes at this time.  Changes to insurance: No  Questions for the pharmacist: No    Confirmed patient received Welcome Packet with first shipment. The patient will receive a drug information handout for each medication shipped and additional FDA Medication Guides as required.       DISEASE/MEDICATION-SPECIFIC INFORMATION        For patients on injectable medications: Patient currently has 1 doses left.  Next injection is scheduled for 06/14/2020.    SPECIALTY MEDICATION ADHERENCE     Medication Adherence    Patient reported X missed doses in the last month: 0  Specialty Medication: Humira CF 40 mg/0.4 ml  Patient is on additional specialty medications: No  Any gaps in refill history greater than 2 weeks in the last 3 months: no  Demonstrates understanding of importance of adherence: yes  Informant: patient  Reliability of informant: reliable  Confirmed plan for next specialty medication refill: delivery by pharmacy  Refills needed for supportive medications: not needed                Humira CF 40 mg/0.4 ml. 7 days on hand       SHIPPING     Shipping address confirmed in Epic.     Delivery Scheduled: Yes, Expected medication delivery date: 06/16/2020.  However, Rx request for refills was sent to the provider as there are none remaining.     Medication will be delivered via UPS to the prescription address in Epic WAM. Eesha Schmaltz D Shanita Kanan   [facility] Specialty Technician

## 2020-06-08 NOTE — Unmapped (Signed)
Received a refill request for medication pended. Pt last seen today.

## 2020-06-08 NOTE — Unmapped (Signed)
I saw and evaluated the patient, participating in the key elements of the service.  I discussed the findings, assessment and plan with the resident and agree with resident’s findings and plan as documented in the resident's note.  I was immediately available for the entirety of the procedure(s) and present for the key and critical portions. Manahil Vanzile J Perrie Ragin, MD

## 2020-06-08 NOTE — Unmapped (Signed)
Cutaneous lupus erythematosus  - Stop Hydrocortisone cream.  - Start fluocinonide (LIDEX) 0.05 % cream; Apply 1 application topically Two (2) times a day. To rash on face. Use for up to 2 weeks at a time. Practice sun protection.

## 2020-06-10 NOTE — Unmapped (Signed)
PA initiated for fluocinonide cream via CMM. Key is [id number].

## 2020-06-13 ENCOUNTER — Ambulatory Visit: Payer: Medicaid Other | Admitting: Physical Therapy

## 2020-06-13 ENCOUNTER — Other Ambulatory Visit: Payer: Self-pay

## 2020-06-13 ENCOUNTER — Other Ambulatory Visit: Payer: Self-pay | Admitting: Physical Medicine and Rehabilitation

## 2020-06-13 DIAGNOSIS — G8929 Other chronic pain: Secondary | ICD-10-CM

## 2020-06-13 DIAGNOSIS — M797 Fibromyalgia: Secondary | ICD-10-CM

## 2020-06-13 DIAGNOSIS — M6281 Muscle weakness (generalized): Secondary | ICD-10-CM

## 2020-06-13 DIAGNOSIS — R293 Abnormal posture: Secondary | ICD-10-CM

## 2020-06-13 DIAGNOSIS — M5440 Lumbago with sciatica, unspecified side: Secondary | ICD-10-CM | POA: Diagnosis not present

## 2020-06-13 MED ORDER — GABAPENTIN 300 MG PO CAPS: 300.0000 mg | ORAL_CAPSULE | Freq: Three times a day (TID) | ORAL | 5 refills | Status: DC

## 2020-06-13 MED ORDER — HYDROCODONE-ACETAMINOPHEN 10-325 MG PO TABS: 1.0000 | ORAL_TABLET | Freq: Every day | ORAL | 0 refills | Status: DC | PRN

## 2020-06-13 NOTE — Telephone Encounter (Signed)
We can start prescribing Hydrocodone at follow-up appointment. Please bring Hysingla to follow-up appointment and we will dispose of it.
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
## 2020-06-13 NOTE — Telephone Encounter (Signed)
Patient called requesting refill on Hydrocodone and Gabapentin.  Contract and UDS done. She states that her pharmacy delivered Hysingla to her along with other meds. She states she didn't ask them to fill, wants to know what to do with medication.

## 2020-06-13 NOTE — Telephone Encounter (Signed)
I spoke with [name] and she will bring the Hysingla by our office today for [place] to dispose and Dr Ranell Patrick will send the hydrocodone and gabapentin to the pharmacy for her today.

## 2020-06-13 NOTE — Telephone Encounter (Signed)
Per PMP the Hysingla 20 mg ER #30 was filled 06/08/20 from Dr Brandy Hale (pain management)

## 2020-06-14 DIAGNOSIS — M359 Systemic involvement of connective tissue, unspecified: Principal | ICD-10-CM

## 2020-06-15 MED ORDER — HYDROXYCHLOROQUINE 200 MG TABLET
ORAL_TABLET | 11 refills | 0 days
Start: 2020-06-15 — End: ?

## 2020-06-15 MED FILL — HUMIRA PEN CITRATE FREE 40 MG/0.4 ML: 28 days supply | Qty: 4 | Fill #0 | Status: AC

## 2020-06-16 ENCOUNTER — Ambulatory Visit (INDEPENDENT_AMBULATORY_CARE_PROVIDER_SITE_OTHER): Payer: Medicaid Other | Admitting: Bariatrics

## 2020-06-16 ENCOUNTER — Other Ambulatory Visit: Payer: Self-pay

## 2020-06-16 ENCOUNTER — Encounter (INDEPENDENT_AMBULATORY_CARE_PROVIDER_SITE_OTHER): Payer: Self-pay | Admitting: Bariatrics

## 2020-06-16 VITALS — BP 113/79 | HR 83 | Temp 98.0°F

## 2020-06-16 DIAGNOSIS — K219 Gastro-esophageal reflux disease without esophagitis: Secondary | ICD-10-CM

## 2020-06-16 DIAGNOSIS — E669 Obesity, unspecified: Secondary | ICD-10-CM

## 2020-06-16 DIAGNOSIS — M25562 Pain in left knee: Secondary | ICD-10-CM

## 2020-06-16 DIAGNOSIS — E559 Vitamin D deficiency, unspecified: Secondary | ICD-10-CM | POA: Diagnosis not present

## 2020-06-16 DIAGNOSIS — Z6831 Body mass index (BMI) 31.0-31.9, adult: Secondary | ICD-10-CM

## 2020-06-16 DIAGNOSIS — M25561 Pain in right knee: Secondary | ICD-10-CM | POA: Diagnosis not present

## 2020-06-16 DIAGNOSIS — G8929 Other chronic pain: Secondary | ICD-10-CM

## 2020-06-16 MED ORDER — VITAMIN D (ERGOCALCIFEROL) 1.25 MG (50000 UNIT) PO CAPS: 50000.0000 [IU] | ORAL_CAPSULE | ORAL | 0 refills | Status: DC

## 2020-06-16 NOTE — Progress Notes (Signed)
Chief Complaint:   OBESITY [name] is here to discuss her progress with her obesity treatment plan along with follow-up of her obesity related diagnoses. [name] is on the Category 1 Plan and states she is following her eating plan approximately 50% of the time. [name] states she is exercising 0 minutes 0 times per week.  Today's visit was #: 4 Starting weight: 166 lbs Starting date: 04/18/2020 Today's weight: 160 lbs Today's date: 06/16/2020 Total lbs lost to date: 6 Total lbs lost since last in-office visit: 0  Interim History: [name] is up 1 lb. She reports doing better with her protein intake.  Subjective:   Vitamin D deficiency. [name] is taking high dose Vitamin D. Last Vitamin D was 27.0 on 04/18/2020.  Bilateral chronic knee pain. [name] reports pain is "acting up."  Gastroesophageal reflux disease, unspecified whether esophagitis present. [name] reports having a history of GERD/"gastric ulcer" at the age of 73.  Assessment/Plan:   Vitamin D deficiency. Low Vitamin D level contributes to fatigue and are associated with obesity, breast, and colon cancer. She was given a prescription for Vitamin D, Ergocalciferol, (DRISDOL) 1.25 MG (50000 UNIT) CAPS capsule every week #4 with 0 refills and will follow-up for routine testing of Vitamin D, at least 2-3 times per year to avoid over-replacement.   Bilateral chronic knee pain. [name] will gradually increase her activities.  Gastroesophageal reflux disease, unspecified whether esophagitis present. Intensive lifestyle modifications are the first line treatment for this issue. We discussed several lifestyle modifications today and she will continue to work on diet, exercise and weight loss efforts. Orders and follow up as documented in patient record. [name] will begin taking Dexilant daily instead of prn. She will follow-up with her PCP as scheduled.  Counseling . If a person has gastroesophageal reflux disease (GERD), food  and stomach acid move back up into the esophagus and cause symptoms or problems such as damage to the esophagus. . Anti-reflux measures include: raising the head of the bed, avoiding tight clothing or belts, avoiding eating late at night, not lying down shortly after mealtime, and achieving weight loss. . Avoid ASA, NSAID's, caffeine, alcohol, and tobacco.  . OTC Pepcid and/or Tums are often very helpful for as needed use.  Marland Kitchen However, for persisting chronic or daily symptoms, stronger medications like Omeprazole may be needed. . You may need to avoid foods and drinks such as: ? Coffee and tea (with or without caffeine). ? Drinks that contain alcohol. ? Energy drinks and sports drinks. ? Bubbly (carbonated) drinks or sodas. ? Chocolate and cocoa. ? Peppermint and mint flavorings. ? Garlic and onions. ? Horseradish. ? Spicy and acidic foods. These include peppers, chili powder, curry powder, vinegar, hot sauces, and BBQ sauce. ? Citrus fruit juices and citrus fruits, such as oranges, lemons, and limes. ? Tomato-based foods. These include red sauce, chili, salsa, and pizza with red sauce. ? Fried and fatty foods. These include donuts, french fries, potato chips, and high-fat dressings. ? High-fat meats. These include hot dogs, rib eye steak, sausage, ham, and bacon.  Class 1 obesity with serious comorbidity and body mass index (BMI) of 31.0 to 31.9 in adult, unspecified obesity type.  [name] is currently in the action stage of change. As such, her goal is to continue with weight loss efforts. She has agreed to the Category 1 Plan.   She will work on meal planning, intentional eating, and increasing her water intake.  Exercise goals: [name] will gradually  increase her activity.  Behavioral modification strategies: increasing lean protein intake, decreasing simple carbohydrates, increasing vegetables, increasing water intake, decreasing eating out, no skipping meals, meal planning and cooking  strategies, keeping healthy foods in the home and planning for success.  [name] has agreed to follow-up with our clinic in 2-3 weeks. She was informed of the importance of frequent follow-up visits to maximize her success with intensive lifestyle modifications for her multiple health conditions.   Objective:   Blood pressure 113/79, pulse 83, temperature 98 F (36.7 C), height (P) 5' (1.524 m), weight (P) 160 lb (72.6 kg), SpO2 100 %. Body mass index is 31.25 kg/m (pended).  General: Cooperative, alert, well developed, in no acute distress. HEENT: Conjunctivae and lids unremarkable. Cardiovascular: Regular rhythm.  Lungs: Normal work of breathing. Neurologic: No focal deficits.   Lab Results  Component Value Date   CREATININE 0.63 01/25/2020   BUN 7 01/25/2020   NA 139 01/25/2020   K 4.1 01/25/2020   CL 108 (H) 01/25/2020   CO2 19 (L) 01/25/2020   Lab Results  Component Value Date   ALT 16 01/25/2020   AST 16 01/25/2020   ALKPHOS 76 01/25/2020   BILITOT 0.3 01/25/2020   Lab Results  Component Value Date   HGBA1C 5.3 01/25/2020   Lab Results  Component Value Date   INSULIN 8.4 04/18/2020   Lab Results  Component Value Date   TSH 2.790 04/18/2020   Lab Results  Component Value Date   CHOL 120 01/25/2020   HDL 28 (L) 01/25/2020   LDLCALC 66 01/25/2020   TRIG 150 (H) 01/25/2020   CHOLHDL 4.3 01/25/2020   Lab Results  Component Value Date   WBC 9.1 01/25/2020   HGB 14.8 01/25/2020   HCT 44.6 01/25/2020   MCV 91 01/25/2020   PLT 262 01/25/2020   No results found for: IRON, TIBC, FERRITIN  Attestation Statements:   Reviewed by clinician on day of visit: allergies, medications, problem list, medical history, surgical history, family history, social history, and previous encounter notes.  Time spent on visit including pre-visit chart review and post-visit charting and care was 20 minutes.   Migdalia Dk, am acting as Location manager for [employer], DO     I have reviewed the above documentation for accuracy and completeness, and I agree with the above. Jearld Lesch, DO

## 2020-06-20 ENCOUNTER — Encounter (INDEPENDENT_AMBULATORY_CARE_PROVIDER_SITE_OTHER): Payer: Self-pay | Admitting: Bariatrics

## 2020-06-21 ENCOUNTER — Encounter: Payer: Self-pay | Admitting: Physical Therapy

## 2020-06-21 ENCOUNTER — Other Ambulatory Visit: Payer: Self-pay

## 2020-06-21 ENCOUNTER — Ambulatory Visit: Payer: Medicaid Other | Admitting: Physical Therapy

## 2020-06-21 DIAGNOSIS — G8929 Other chronic pain: Secondary | ICD-10-CM

## 2020-06-21 DIAGNOSIS — R293 Abnormal posture: Secondary | ICD-10-CM

## 2020-06-21 DIAGNOSIS — M5440 Lumbago with sciatica, unspecified side: Secondary | ICD-10-CM | POA: Diagnosis not present

## 2020-06-21 DIAGNOSIS — M6281 Muscle weakness (generalized): Secondary | ICD-10-CM

## 2020-06-21 DIAGNOSIS — M797 Fibromyalgia: Secondary | ICD-10-CM

## 2020-06-21 DIAGNOSIS — L732 Hidradenitis suppurativa: Principal | ICD-10-CM

## 2020-06-21 MED ORDER — DOXYCYCLINE MONOHYDRATE 100 MG CAPSULE
ORAL_CAPSULE | Freq: Two times a day (BID) | ORAL | 0 refills | 14.00000 days | Status: CP
Start: 2020-06-21 — End: 2020-07-05

## 2020-06-21 NOTE — Patient Instructions (Addendum)
°  Trigger Point Dry Needling   What is Trigger Point Dry Needling (DN)? o DN is a physical therapy technique used to treat muscle pain and dysfunction. Specifically, DN helps deactivate muscle trigger points (muscle knots).  o A thin filiform needle is used to penetrate the skin and stimulate the underlying trigger point. The goal is for a local twitch response (LTR) to occur and for the trigger point to relax. No medication of any kind is injected during the procedure.    What Does Trigger Point Dry Needling Feel Like?  o The procedure feels different for each individual patient. Some patients report that they do not actually feel the needle enter the skin and overall the process is not painful. Very mild bleeding may occur. However, many patients feel a deep cramping in the muscle in which the needle was inserted. This is the local twitch response.    How Will I feel after the treatment? o Soreness is normal, and the onset of soreness may not occur for a few hours. Typically this soreness does not last longer than two days.  o Bruising is uncommon, however; ice can be used to decrease any possible bruising.  o In rare cases feeling tired or nauseous after the treatment is normal. In addition, your symptoms may get worse before they get better, this period will typically not last longer than 24 hours.    What Can I do After My Treatment? o Increase your hydration by drinking more water for the next 24 hours. o You may place ice or heat on the areas treated that have become sore, however, do not use heat on inflamed or bruised areas. Heat often brings more relief post needling. o You can continue your regular activities, but vigorous activity is not recommended initially after the treatment for 24 hours. o DN is best combined with other physical therapy such as strengthening, stretching, and other therapies.        Hamstring Step 2    Kneel, buttocks on heels. Fold upper body forward  from hips. Then reach to each side as far as possible, keeping chest low toward floor. Hold each position _30__ seconds. Repeat __3_ times per session. Do __2_ sessions per day.  Copyright  VHI. All rights reserved.     Voncille Lo, PT Certified Exercise Expert for the Aging Adult  06/21/20 3:18 PM Phone: [phone] Fax: [phone]
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
## 2020-06-21 NOTE — Unmapped (Signed)
Sent Doxcycline 100 mg BID x 14 days to pharmacy

## 2020-06-21 NOTE — Unmapped (Signed)
Patient called on nurse line at 10:52am this morning asking for an antibiotic for her HS flare under right arm.  States it is just beginning and very painful.

## 2020-06-22 ENCOUNTER — Encounter
Payer: Medicaid Other | Attending: Physical Medicine and Rehabilitation | Admitting: Physical Medicine and Rehabilitation

## 2020-06-22 ENCOUNTER — Encounter: Payer: Self-pay | Admitting: Physical Medicine and Rehabilitation

## 2020-06-22 VITALS — BP 114/82 | HR 83 | Temp 97.7°F | Ht 60.0 in | Wt 163.0 lb

## 2020-06-22 DIAGNOSIS — R600 Localized edema: Secondary | ICD-10-CM

## 2020-06-22 DIAGNOSIS — Z5181 Encounter for therapeutic drug level monitoring: Secondary | ICD-10-CM | POA: Insufficient documentation

## 2020-06-22 DIAGNOSIS — M797 Fibromyalgia: Secondary | ICD-10-CM | POA: Insufficient documentation

## 2020-06-22 DIAGNOSIS — M25512 Pain in left shoulder: Secondary | ICD-10-CM | POA: Insufficient documentation

## 2020-06-22 DIAGNOSIS — M25511 Pain in right shoulder: Secondary | ICD-10-CM | POA: Diagnosis present

## 2020-06-22 DIAGNOSIS — M5442 Lumbago with sciatica, left side: Secondary | ICD-10-CM | POA: Diagnosis present

## 2020-06-22 DIAGNOSIS — Z79891 Long term (current) use of opiate analgesic: Secondary | ICD-10-CM | POA: Diagnosis present

## 2020-06-22 DIAGNOSIS — M5441 Lumbago with sciatica, right side: Secondary | ICD-10-CM | POA: Diagnosis present

## 2020-06-22 DIAGNOSIS — R2689 Other abnormalities of gait and mobility: Secondary | ICD-10-CM | POA: Insufficient documentation

## 2020-06-22 DIAGNOSIS — M329 Systemic lupus erythematosus, unspecified: Secondary | ICD-10-CM | POA: Insufficient documentation

## 2020-06-22 DIAGNOSIS — M4120 Other idiopathic scoliosis, site unspecified: Secondary | ICD-10-CM | POA: Diagnosis present

## 2020-06-22 DIAGNOSIS — I10 Essential (primary) hypertension: Secondary | ICD-10-CM

## 2020-06-22 DIAGNOSIS — G8929 Other chronic pain: Secondary | ICD-10-CM | POA: Diagnosis present

## 2020-06-22 DIAGNOSIS — M5136 Other intervertebral disc degeneration, lumbar region: Secondary | ICD-10-CM

## 2020-06-22 MED ORDER — SPIRONOLACTONE 25 MG PO TABS: 25.0000 mg | ORAL_TABLET | Freq: Every day | ORAL | 1 refills | Status: DC

## 2020-06-22 NOTE — Progress Notes (Signed)
Subjective:    Patient ID: [name], female    DOB: 1981-05-27, 39 y.o.   MRN: [id number]  HPI She previously fell on her back twice. She currently has enough pain medications. On her second visit she brought in a letter from her prior clinic that says they will no longer be prescribing narcotics.   She is going to [facility] with her husband in July. Has been having a good quality of life and has been functional on current dose of Norco.   She will be starting her physical in June.  She continues to have lower back pain that radiates into her bilateral legs.  She has never had spinal injections in the past.   Since last visit she has been experiencing a lupus flare. She has a malar rash. She finds her lupus medications have been less effective.   Current pain has decreased from 7 to 5.   She notes worsening lower extremity edema which she feels may be due to her Lupus. She is on spironolactone and will be running out of her medication soon and needs a refill.   5/12 Initial history: [name] is a 39 year old woman who presents with diffuse joint pain. She has a history of fibromyalgia, Lupus, scoliosis, Sjogren's Disease, and lumbar disc disease.   She wants to go back to work and get off of disability. She had just graduated when she was diagnosed with Lupus in 2016. She was depressed at that time but feels much better now.   Her current pain is 7/10. The pain is achy. She has had hip injections that last about 6 months. She takes Hydrocodone 10mg /325mg  about 4 times per day. She tries heat and soaks in Epsom salt. She uses a heating pad. She has tried physical therapy, TENS unit. She has tried Lyrica and did not do well with this. She currently takes Gabapentin which does help with her nerve pain.   She follows with psychology and takes Vyvanse for ADHD. She recently decreased dose to 30mg  as it was causing her heart palpitations.   She has two children aged 13 and  99.  Pain Inventory Average Pain 5 Pain Right Now 5 My pain is sharp, stabbing, tingling and aching  In the last 24 hours, has pain interfered with the following? General activity 10 Relation with others 0 Enjoyment of life 10 What TIME of day is your pain at its worst? morning, night  Sleep (in general) Poor  Pain is worse with: bending, sitting and standing Pain improves with: heat/ice, therapy/exercise and medication Relief from Meds: 10  Mobility walk with assistance use a cane ability to climb steps?  yes do you drive?  yes  Function not employed: date last employed . disabled: date disabled . I need assistance with the following:  household duties and shopping Do you have any goals in this area?  yes  Neuro/Psych numbness tingling spasms  Prior Studies Any changes since last visit?  no  Physicians involved in your care Any changes since last visit?  no   Family History  Problem Relation Age of Onset  . Heart disease Mother   . Diabetes Mother   . Hypertension Mother   . High Cholesterol Mother   . Thyroid disease Mother   . Depression Mother   . Obesity Mother   . Heart disease Father   . Diabetes Father   . Hypertension Father   . High Cholesterol Father   .  Alcoholism Father   . Bipolar disorder Brother   . Cancer Brother        prostate  . Pancreatic cancer Maternal Grandmother   . Cancer Maternal Grandmother        pancreatic  . Cancer Maternal Uncle        lung  . Cancer Paternal Aunt   . Bipolar disorder Sister    Social History   Socioeconomic History  . Marital status: Married    Spouse name: [name]  . Number of children: Not on file  . Years of education: Not on file  . Highest education level: Not on file  Occupational History  . Occupation: stay at home mom  Tobacco Use  . Smoking status: Current Every Day Smoker    Packs/day: 1.00    Years: 11.00    Pack years: 11.00    Types: Cigarettes  . Smokeless tobacco: Never  Used  . Tobacco comment: too much stress  Vaping Use  . Vaping Use: Never used  Substance and Sexual Activity  . Alcohol use: No  . Drug use: No  . Sexual activity: Yes    Birth control/protection: Surgical  Other Topics Concern  . Not on file  Social History Narrative   At [employer] wanting to get apply to nursing school.  Plans to apply 2/13.     Social Determinants of Health   Financial Resource Strain:   . Difficulty of Paying Living Expenses:   Food Insecurity:   . Worried About Charity fundraiser in the Last Year:   . Arboriculturist in the Last Year:   Transportation Needs:   . Film/video editor (Medical):   Marland Kitchen Lack of Transportation (Non-Medical):   Physical Activity:   . Days of Exercise per Week:   . Minutes of Exercise per Session:   Stress:   . Feeling of Stress :   Social Connections:   . Frequency of Communication with Friends and Family:   . Frequency of Social Gatherings with Friends and Family:   . Attends Religious Services:   . Active Member of Clubs or Organizations:   . Attends Archivist Meetings:   Marland Kitchen Marital Status:    Past Surgical History:  Procedure Laterality Date  . ABDOMINAL HYSTERECTOMY  06/30/2008   partial  . AXILLARY HIDRADENITIS EXCISION     bilateral  . CESAREAN SECTION  08/31/2006, 08/26/2007  . LAPAROSCOPIC ENDOMETRIOSIS FULGURATION  06/1999  . MASS EXCISION  06/19/2012   Procedure: EXCISION MASS;  Surgeon: Merrie Roof, MD;  Location: [facility];  Service: General;  Laterality: Right;  excision mass right abdominal wall   Past Medical History:  Diagnosis Date  . Abdominal wall mass   . ADHD (attention deficit hyperactivity disorder)   . Anemia   . Arthritis   . Asthma   . Back pain   . Chest pain   . Depression    tried several meds, not on any now  . Diarrhea   . Endometriosis   . Headache(784.0)   . Hydradenitis   . Hydradenitis 07/13/2011  . Hypertension    on inderal  . Leukemia ([place]) at  age 36  . Lupus ([place])   . Migraines   . MRSA (methicillin resistant staph aureus) culture positive 2010   ear infection that cultured out MRSA   . Nausea   . S/P total abdominal hysterectomy 06/26/2011  . Scoliosis   . Thyroid disease  BP 114/82   Pulse 83   Temp 97.7 F (36.5 C)   Ht 5' (1.524 m)   Wt 163 lb (73.9 kg)   SpO2 99%   BMI 31.83 kg/m   Opioid Risk Score:   Fall Risk Score:  `1  Depression screen PHQ 2/9  Depression screen [facility] 2/9 05/11/2020 04/18/2020 03/18/2020 05/06/2012  Decreased Interest 3 3 1 1   Down, Depressed, Hopeless 0 3 0 1  PHQ - 2 Score 3 6 1 2   Altered sleeping 3 3 1  -  Tired, decreased energy 3 3 3  -  Change in appetite 0 3 0 -  Feeling bad or failure about yourself  0 3 1 -  Trouble concentrating 3 3 1  -  Moving slowly or fidgety/restless 0 0 0 -  Suicidal thoughts 0 0 0 -  PHQ-9 Score 12 21 7  -  Difficult doing work/chores - Somewhat difficult - -    Review of Systems  Constitutional: Negative.   HENT: Negative.   Eyes: Negative.   Respiratory: Negative.   Cardiovascular: Positive for leg swelling.  Gastrointestinal: Negative.   Genitourinary: Negative.   Musculoskeletal: Positive for arthralgias and back pain.       Spasms   Skin: Negative.   Allergic/Immunologic: Negative.   Neurological: Positive for numbness.       Tingling  Hematological: Negative.   Psychiatric/Behavioral: Negative.   All other systems reviewed and are negative.      Objective:   Physical Exam Gen: no distress, normal appearing HEENT: oral mucosa pink and moist, NCAT Cardio: Reg rate Chest: normal effort, normal rate of breathing Abd: soft, non-distended Ext: no edema Skin: intact Neuro:AOx3. Musculoskeletal: Tenderness to palpation of bilateral shoulders. Tenderness to palpation of bilateral lumbar paraspinals.  Psych: pleasant, normal affect, tangential and times.     Assessment & Plan:  [name] is a 39 year old woman who presents with  diffuse joint pain. She has a history of fibromyalgia, Lupus, scoliosis, Sjogren's Disease, and lumbar disc disease.   Fibromyalgia There is presence of widespread pain above and below the waist on both sides for more than 3 months that is consistent with fibromyalgia. There are related symptoms of fatigue and cognitive dysfunction.  Recommended30 minutes of aerobic exercise 5 times per week and resistance exercise of all major muscle groups at least twice per week. Educated patient that this is a crucial component of recovery and that self-responsibility is key.She is already very good about going to [employer] with her daughter 5x per week which is excellent!  Continue phsychology visits which appear to be greatly helping her.   Lumbar disc degeneration: -Appears to be a component of neuropathic pain in both legs, no evidence of radiculopathy or stenosis on 2016 MRI. Ordered lumbar XR to assess for stenosis since it has been 5 years since last imaging.  -Prescribed physical therapy for core and paraspinal stretching and strengthening. Starting in June.  -Pain contract and UDS signed first visit.   Quality of Life: -Continue to encourage return to work, which is one of patient's goals.She is going to the beach with her family in July which she is very excited about.   Hypotension: -Decreased amlodipine to 5mg . Provided script. Log pressures and bring to f/u appointment. Amlodipine may also be contributing to leg swelling. Hopefully with more exercise and better nutrition we can get her off this medication.   Vitamin D deficiency: continue 50,000U weekly. Advised that deficiency can also contribute to pain.  Bilateral shoulder pain: Discussed results of shoulder XRs with patient- both were stable.   Leg swelling: Advised ice, compression garments, elevation. Provided refill of spironolactone.  Obesity: Advised low carb diet. Patient eager to follow and will involve her  husband who is very supportive.   All questions answered. RTC in 1 month.

## 2020-06-23 ENCOUNTER — Encounter: Payer: Self-pay | Admitting: Physical Medicine and Rehabilitation

## 2020-06-23 ENCOUNTER — Telehealth: Admit: 2020-06-23 | Discharge: 2020-06-24 | Payer: MEDICAID

## 2020-06-23 MED ORDER — HYDROXYCHLOROQUINE 200 MG TABLET
ORAL_TABLET | Freq: Every day | ORAL | 3 refills | 90.00000 days | Status: CP
Start: 2020-06-23 — End: ?

## 2020-06-23 NOTE — Unmapped (Signed)
Patient Name: Kim Charles  ZOX:[id number] L. Earlene Plater, DO  Source of History: patient and records  Date of Visit: 06/23/20 2:09 PM    Identification: Pt self identified using name and date of birth  Patient location: [phone], [place] home. Sent links x 2 with no response. Called patient and she stated she was trying to connect. She connected two minutes later.   The limitations of this telemedicine encounter were discussed with patient. Both the patient and myself agreed to this encounter despite these limitations. Benefits of this telemedicine encounter included allowing for continued care of patient and minimizing risk of exposure to COVID-19. Patient also aware that this is a billable encounter with possible copay.     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, +SSA, photosensitivity, raynaud's, fatigue, weight loss, episodic generalized edema and polyarthralgia with swelling of both large and small joints, recurrent pneumonitis/pleuritis, malar rash, generalized weakness that is worse proximally, nausea/vomiting/abdominal bloating, mild sicca symptoms, oral ulcerations, history of recurrent miscarriages (no VTE, minimally elevated aCL IgG/IgM, neg LAC and neg B2GP1 Ab) and migraines.??She is being treated with HCQ.????MTX added in 12/2018 due to concerns for inflammatory arthritis.??Additional PMH of childhood leukemia, hidradenitis suppurativa, asthma, OSA, fibromyalgia. For her hidradenitis, humira was started in 04/2019.    HPI: Kim Charles is a 39 y.o. female who presents for her follow-up with UCTD who I met in January 2020 and last saw in person in follow-up in March 2020. 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 She completed a televisit in July 2020 with [facility] and a phone visit with me in October 2020. She had another televisit with [facility] in February 2021. She returns today for a video visit but she was offered to come in person but declined.     Today, patient reports that the swelling in her legs resolved. She did have a secondary infection in her axilla due to the HS and has been started on doxycycline. She will be on doxycycline for two weeks. Level C interaction with methotrexate so will hold for two weeks. She feels like she has a URI and bronchitis. She has been tolerating methotrexate better with use of the promethazine an hour before the methotrexate. She did need a breathing treatment the night prior for her bronchitis. She reports that her joints are stable. She more pain when there is rain. She is participating in physical therapy. PT used acupuncture for her muscles with good benefit. Humira is helping with her HS. This is her first flare of HS since being on Humira. She reports compliance with hydroxychloroquine 400 mg po qd. She completed her last eye exam in December 2020.  Reports sores in her mouth. Reports dry eyes.    ROS??: Attests to the above, otherwise, review of all other systems is negative.   ????  Past Medical and Surgical History:  ??  Patient Active Problem List    Diagnosis Date Noted   ??? Connective tissue disease, undifferentiated (CMS-HCC) 05/14/2018   ??? Fibromyalgia 05/14/2018   ??? Hidradenitis suppurativa 05/14/2018   ??? Tobacco abuse 01/21/2014     Past Surgical History:   Procedure Laterality Date   ??? CESAREAN SECTION     ??? LAPAROSCOPIC ENDOMETRIOSIS FULGURATION     ??? PARTIAL HYSTERECTOMY       Allergies:   ??  Allergies   Allergen Reactions   ??? Other Hives, Itching and Other (See Comments)     High fever. Hives and itching all over the body.   ??? Shellfish Containing Products Anaphylaxis   ??? Sulfasalazine Hives   ??? Toradol [Ketorolac] Hives   ??? Venom-Honey Bee Anaphylaxis   ??? Opioids - Morphine Analogues      Other reaction(s): Other  Other reaction(s): Other (See Comments)  Causes migraine  Headache   ??? Tramadol Nausea And Vomiting   ??? Hydromorphone Hcl      migraine   ??? Naproxen      Grits teeth     Current Outpatient Medications:  ??  Current Outpatient Medications on File Prior to Visit   Medication Sig Dispense Refill   ??? acyclovir (ZOVIRAX) 400 MG tablet Take 1 tablet by mouth by mouth 2 times daily for prophylaxis  6   ??? albuterol (PROVENTIL HFA;VENTOLIN HFA) 90 mcg/actuation inhaler Inhale 2 puffs daily as needed.      ??? amLODIPine (NORVASC) 5 MG tablet Take 10 mg by mouth daily.      ??? aspirin-acetaminophen-caffeine (EXCEDRIN MIGRAINE) 250-250-65 mg per tablet Take 2 tablets by mouth.     ??? BD TUBERCULIN SYRINGE 1 mL 25 gauge x 5/8 Syrg Use 1 syringe every 7 days with methotrexate 50 each 1   ??? beclomethasone (QVAR) 40 mcg/actuation inhaler Inhale 2 puffs daily as needed.      ??? cloNIDine HCl (CATAPRES) 0.1 MG tablet Take 0.1 mg by mouth Three (3) times a day.      ??? dexlansoprazole (DEXILANT) 60 mg capsule Take 60 mg by mouth.     ??? doxycycline (MONODOX) 100 MG capsule Take 1 capsule (100 mg total) by mouth Two (2) times a day for 14 days. 28 capsule 0   ??? EPINEPHrine (EPIPEN) 0.3 mg/0.3 mL injection Inject 0.3 mg into the muscle.     ??? ergocalciferol-1,250 mcg, 50,000 unit, (DRISDOL) 1,250 mcg (50,000 unit) capsule Take 50,000 Units by mouth.     ??? escitalopram oxalate (LEXAPRO) 10 MG tablet Take 10 mg by mouth daily.  3   ??? fluocinonide (LIDEX) 0.05 % cream Apply 1 application topically Two (2) times a day. To rash on face. Use for up to 2 weeks at a time. Practice sun protection. 30 g 1   ??? fluticasone propionate (FLOVENT HFA) 220 mcg/actuation inhaler 2 puffs.     ??? gabapentin (NEURONTIN) 300 MG capsule Take 300 mg by mouth Three (3) times a day.     ??? HUMIRA PEN CITRATE FREE 40 MG/0.4 ML Inject the contents of 1 pen (40 mg total) under the skin every seven (7) days. 4 each 4   ??? hydroCHLOROthiazide (HYDRODIURIL) 25 MG tablet Take 200 mg by mouth.      ??? HYDROcodone-acetaminophen (NORCO 10-325) 10-325 mg per tablet Take 1 tablet by mouth 5 times daily as needed for pain     ??? hydrocortisone 2.5 % cream 1-2 times a day on the red areas on the face for 1 week straight maximum. 30 g 1   ??? hydrOXYchloroQUINE (PLAQUENIL) 200 mg tablet Take 1 tablet (200 mg total) by mouth daily. 180 tablet 3   ??? HYSINGLA ER 20 mg TP24 Take 1 tablet by mouth every 24 hours     ??? ipratropium (ATROVENT) 0.03 % nasal spray 2 sprays into each nostril.     ??? leucovorin (WELLCOVORIN) 5 mg tablet Take 3 tablets (15 mg total) by mouth  daily. Take 1 tablet (15 mg) by mouth once a week 18-24 hours after the methotrexate. 90 tablet 3   ??? lidocaine (LIDODERM) 5 % patch Apply 1 patch to skin as directed 12 hours on, 12 hours off     ??? metFORMIN (GLUCOPHAGE) 500 MG tablet TAKE 1 TABLET BY MOUTH 2 TIMES DAILY WITH MEALS 180 tablet 3   ??? methotrexate, Preservative Free, 25 mg/mL Soln vial/syringe Inject 0.6 mL (15 mg total) under the skin once a week. 12 mL 1   ??? MITIGARE 0.6 mg cap capsule Take 0.6 mg by mouth daily.     ??? montelukast (SINGULAIR) 10 mg tablet Take 10 mg by mouth nightly.     ??? NARCAN 4 mg/actuation nasal spray spray (4 mg) in 1 nostril may repeat dose every 2-3 minutes as needed alternating nostrils with each dose     ??? promethazine (PHENERGAN) 25 MG tablet Take 1 tablet (25 mg total) by mouth every six (6) hours as needed for nausea. 30 tablet 3   ??? propranolol (INDERAL) 40 MG tablet Take 40 mg by mouth Two (2) times a day.      ??? spironolactone (ALDACTONE) 25 MG tablet Take 25 mg by mouth.     ??? topiramate (TOPAMAX) 50 MG tablet TAKE 1 TABLET BY MOUTH 2 TIMES DAILY  5   ??? traZODone (DESYREL) 50 MG tablet Take 50 mg by mouth.     ??? VYVANSE 30 mg capsule Take 30 mg by mouth two (2) times a day.   0     No current facility-administered medications on file prior to visit.     ????  ??  Immunization History   Administered Date(s) Administered   ??? COVID-19 VACC,MRNA,(PFIZER)(PF)(IM) 02/20/2020, 03/15/2020   ??? DTaP 11/06/1982, 02/19/1983, 05/29/1983, 07/30/1984, 09/23/1986   ??? INFLUENZA TIV (TRI) PF (IM) 11/08/2014   ??? Influenza Vaccine Quad (IIV4 PF) 46mo+ injectable 03/05/2019   ??? Influenza Virus Vaccine, unspecified formulation 07/22/2016, 03/05/2019   ??? MMR 10/02/1983   ??? OPV 11/06/1982, 02/19/1983, 05/29/1983, 07/30/1984, 09/23/1986   ??? PNEUMOCOCCAL POLYSACCHARIDE 23 01/19/2015   ??? PPD Test 11/08/2014   ??? Rabies IM Fibroblast Culture 05/08/2018, 05/11/2018, 05/15/2018, 05/29/2018   ??? TdaP 12/31/2002, 01/20/2013, 05/08/2018   ??? Tetanus and diptheria,(adult), adsorbed, 2Lf tetanus toxoid, PF 07/01/2003     ????  PHYSICAL EXAM?? - limited over the video  Vital signs: There were no vitals taken for this visit.There is no height or weight on file to calculate BMI.  Gen: Well-developed, well-nourished adult in no apparent distress. Normocephalic with no external signs of trauma. Pleasant and cooperative. AOx4.?  HEENT: Face symmetric  Lungs: Talking in full sentences. No audible wheezing. Occasional cough  Neuro: Good comprehension/cognition. CN 2-12 intact.  ????  Limited Musculoskeletal Examination:????  ?? Jaw, neck without limited ROM.????  ?? Shoulders, elbows, wrists, hands, fingers:  No deformity, erythema, warmth, swelling, effusion, tenderness, limited ROM.??  Skin: No rash on face. Unable to assess skin elsewhere??     LABORATORY - reviewed last labs from November 2020, overdue for methotrexate monitoring     ????GENERAL SUMMARY AND IMPRESSION: ????  ????  In summary, the patient is a 39 y.o. 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 She is reporting disease stability. No obvious signs for inflammatory arthritis over video exam. Will continue current regimen. Monitoring labs needed and she plans to complete at  [facility] [place] next week. Follow-up in 3-4 months with [facility] and 6-8 months with myself. She agrees to this plan.    RECOMMENDATIONS: ????  ????   Diagnosis ICD-10-CM Associated Orders   1. Connective tissue disease, undifferentiated (CMS-HCC)  M35.9 hydrOXYchloroQUINE (PLAQUENIL) 200 mg tablet     CBC w/ Differential     Creatinine     Extractable Nuclear Antigen     AST     ALT     Anti-DNA antibody, double-stranded     Urinalysis with Culture Reflex     Protein/Creatinine Ratio, Urine     C3 complement     C4 complement     25 OH Vit D   2. Methotrexate, long term, current use  Z79.899 CBC w/ Differential     Creatinine     Extractable Nuclear Antigen     AST     ALT     Anti-DNA antibody, double-stranded     Urinalysis with Culture Reflex     Protein/Creatinine Ratio, Urine     C3 complement     C4 complement     25 OH Vit D   There are no Patient Instructions on file for this visit.  The patient indicates understanding of these issues and agrees to the plan as outlined above.  Contact information provided for any concerns or questions in the interim.  ??      I spent 20 minutes on the real-time audio and video visit with the patient on the date of service. I spent an additional 10 minutes on pre- and post-visit activities on the date of service.     The patient was not located and I was located within 250 yards of a hospital based location during the real-time audio and video visit. The patient was physically located in [place] or a state in which I am permitted to provide care. The patient and/or parent/guardian understood that s/he may incur co-pays and cost sharing, and agreed to the telemedicine visit. The visit was reasonable and appropriate under the circumstances given the patient's presentation at the time.    The patient and/or parent/guardian has been advised of the potential risks and limitations of this mode of treatment (including, but not limited to, the absence of in-person examination) and has agreed to be treated using telemedicine. The patient's/patient's family's questions regarding telemedicine have been answered.    If the visit was completed in an ambulatory setting, the patient and/or parent/guardian has also been advised to contact their provider???s office for worsening conditions, and seek emergency medical treatment and/or call 911 if the patient deems either necessary.    Treazure Nery C. Scarlette Calico, MD, PhD  Assistant Professor of Medicine  Department of Medicine/Division of Rheumatology  [facility]  2:29 PM

## 2020-06-27 ENCOUNTER — Ambulatory Visit: Admit: 2020-06-27 | Discharge: 2020-06-28 | Payer: MEDICAID

## 2020-06-27 LAB — CBC W/ AUTO DIFF
BASOPHILS ABSOLUTE COUNT: 0.1 10*9/L (ref 0.0–0.1)
BASOPHILS RELATIVE PERCENT: 1.2 %
EOSINOPHILS ABSOLUTE COUNT: 0.1 10*9/L (ref 0.0–0.7)
EOSINOPHILS RELATIVE PERCENT: 1.4 %
HEMATOCRIT: 41.7 % (ref 35.0–44.0)
HEMOGLOBIN: 13.9 g/dL (ref 12.0–15.5)
LYMPHOCYTES ABSOLUTE COUNT: 4.2 10*9/L — ABNORMAL HIGH (ref 0.7–4.0)
MEAN CORPUSCULAR HEMOGLOBIN CONC: 33.3 g/dL (ref 30.0–36.0)
MEAN CORPUSCULAR HEMOGLOBIN: 30.3 pg (ref 26.0–34.0)
MONOCYTES ABSOLUTE COUNT: 0.6 10*9/L (ref 0.1–1.0)
MONOCYTES RELATIVE PERCENT: 5.3 %
NEUTROPHILS ABSOLUTE COUNT: 5.6 10*9/L (ref 1.7–7.7)
NEUTROPHILS RELATIVE PERCENT: 52.5 %
NUCLEATED RED BLOOD CELLS: 0 /100{WBCs} (ref <=4)
PLATELET COUNT: 243 10*9/L (ref 150–450)
RED BLOOD CELL COUNT: 4.59 10*12/L (ref 3.90–5.03)
RED CELL DISTRIBUTION WIDTH: 14.1 % (ref 12.0–15.0)
WBC ADJUSTED: 10.6 10*9/L — ABNORMAL HIGH (ref 3.5–10.5)

## 2020-06-27 LAB — URINALYSIS WITH CULTURE REFLEX
BILIRUBIN UA: NEGATIVE
BLOOD UA: NEGATIVE
GLUCOSE UA: NEGATIVE
KETONES UA: NEGATIVE
LEUKOCYTE ESTERASE UA: NEGATIVE
NITRITE UA: NEGATIVE
PH UA: 8.5 (ref 5.0–9.0)
RBC UA: 0 /HPF (ref <4)
SQUAMOUS EPITHELIAL: 2 /HPF (ref 0–5)
UROBILINOGEN UA: 0.2
WBC UA: 0 /HPF (ref 0–5)

## 2020-06-27 LAB — ALT (SGPT): Alanine aminotransferase:CCnc:Pt:Ser/Plas:Qn:: 11

## 2020-06-27 LAB — C3 COMPLEMENT
C3 COMPLEMENT: 69 mg/dL — ABNORMAL LOW (ref 88–171)
Complement C3:MCnc:Pt:Ser/Plas:Qn:: 69 — ABNORMAL LOW

## 2020-06-27 LAB — LYMPHOCYTES RELATIVE PERCENT: Lymphocytes/100 leukocytes:NFr:Pt:Bld:Qn:Automated count: 39.6

## 2020-06-27 LAB — CREATININE: CREATININE: 0.6 mg/dL (ref 0.50–0.80)

## 2020-06-27 LAB — C4 COMPLEMENT: Complement C4:MCnc:Pt:Ser/Plas:Qn:: <8 — ABNORMAL LOW

## 2020-06-27 LAB — EGFR CKD-EPI NON-AA FEMALE
Glomerular filtration rate/1.73 sq M.predicted.non black:ArVRat:Pt:Ser/Plas/Bld:Qn:Creatinine-based formula (CKD-EPI): >90

## 2020-06-27 LAB — AST (SGOT): Aspartate aminotransferase:CCnc:Pt:Ser/Plas:Qn:: 16

## 2020-06-27 LAB — PROTEIN / CREATININE RATIO, URINE: CREATININE, URINE: 104.3 mg/dL

## 2020-06-27 LAB — PROTEIN/CREAT RATIO, URINE: Protein/Creatinine:MRto:Pt:Urine:Qn:: 0.042

## 2020-06-27 LAB — RBC UA: Erythrocytes:Naric:Pt:Urine sed:Qn:Microscopy.light.HPF: 0

## 2020-06-28 ENCOUNTER — Ambulatory Visit: Payer: Medicaid Other | Admitting: Physical Therapy

## 2020-06-28 LAB — ENA SCREEN: Lab: 8.8 — ABNORMAL HIGH

## 2020-06-29 LAB — ENA PANEL
ANTI-JO1 AB: NEGATIVE
ANTI-SCL-70 AB: NEGATIVE
ANTI-SM AB: NEGATIVE
ANTI-SSB AB: NEGATIVE

## 2020-06-29 LAB — ANTI-SSB AB: Lab: NEGATIVE

## 2020-06-30 LAB — VITAMIN D, TOTAL (25OH): Lab: 36.8

## 2020-06-30 LAB — DS-DNA TITER: Lab: 1:80 {titer}

## 2020-06-30 LAB — ANTI-DNA ANTIBODY, DOUBLE-STRANDED: DSDNA ANTIBODY: POSITIVE — AB

## 2020-07-04 DIAGNOSIS — M359 Systemic involvement of connective tissue, unspecified: Principal | ICD-10-CM

## 2020-07-04 MED ORDER — LEUCOVORIN CALCIUM 5 MG TABLET
ORAL_TABLET | 3 refills | 0.00000 days
Start: 2020-07-04 — End: ?

## 2020-07-04 MED ORDER — METHOTREXATE SODIUM (PF) 25 MG/ML INJECTION SOLUTION
1 refills | 0 days
Start: 2020-07-04 — End: ?

## 2020-07-06 DIAGNOSIS — M359 Systemic involvement of connective tissue, unspecified: Principal | ICD-10-CM

## 2020-07-06 DIAGNOSIS — Z79899 Other long term (current) drug therapy: Principal | ICD-10-CM

## 2020-07-06 MED ORDER — LEUCOVORIN CALCIUM 5 MG TABLET
ORAL_TABLET | Freq: Every day | ORAL | 3 refills | 30.00000 days | Status: CP
Start: 2020-07-06 — End: ?

## 2020-07-06 NOTE — Unmapped (Signed)
Hi Kim Charles,    Can you help with scheduling this?    Thanks !

## 2020-07-06 NOTE — Unmapped (Signed)
leucovorin refill  Last ov: Visit date not found   Next ov: 10/27/2020     Labs:   AST   Date Value Ref Range Status   06/27/2020 16 <34 U/L Final     ALT   Date Value Ref Range Status   06/27/2020 11 10 - 49 U/L Final     Creatinine   Date Value Ref Range Status   06/27/2020 0.60 0.50 - 0.80 mg/dL Final     WBC   Date Value Ref Range Status   06/27/2020 10.6 (H) 3.5 - 10.5 10*9/L Final     HGB   Date Value Ref Range Status   06/27/2020 13.9 12.0 - 15.5 g/dL Final     HCT   Date Value Ref Range Status   06/27/2020 41.7 35.0 - 44.0 % Final     MCV   Date Value Ref Range Status   06/27/2020 90.9 82.0 - 98.0 fL Final     RDW   Date Value Ref Range Status   06/27/2020 14.1 12.0 - 15.0 % Final     Platelet   Date Value Ref Range Status   06/27/2020 243 150 - 450 10*9/L Final     Neutrophils %   Date Value Ref Range Status   06/27/2020 52.5 % Final     Lymphocytes %   Date Value Ref Range Status   06/27/2020 39.6 % Final     Monocytes %   Date Value Ref Range Status   06/27/2020 5.3 % Final     Eosinophils %   Date Value Ref Range Status   06/27/2020 1.4 % Final     Basophils %   Date Value Ref Range Status   06/27/2020 1.2 % Final

## 2020-07-06 NOTE — Unmapped (Signed)
Methotrexate refill  Last ov: 2.16.21  Next ov: Visit date not found     Labs:   AST   Date Value Ref Range Status   06/27/2020 16 <34 U/L Final     ALT   Date Value Ref Range Status   06/27/2020 11 10 - 49 U/L Final     Creatinine   Date Value Ref Range Status   06/27/2020 0.60 0.50 - 0.80 mg/dL Final     WBC   Date Value Ref Range Status   06/27/2020 10.6 (H) 3.5 - 10.5 10*9/L Final     HGB   Date Value Ref Range Status   06/27/2020 13.9 12.0 - 15.5 g/dL Final     HCT   Date Value Ref Range Status   06/27/2020 41.7 35.0 - 44.0 % Final     MCV   Date Value Ref Range Status   06/27/2020 90.9 82.0 - 98.0 fL Final     RDW   Date Value Ref Range Status   06/27/2020 14.1 12.0 - 15.0 % Final     Platelet   Date Value Ref Range Status   06/27/2020 243 150 - 450 10*9/L Final     Neutrophils %   Date Value Ref Range Status   06/27/2020 52.5 % Final     Lymphocytes %   Date Value Ref Range Status   06/27/2020 39.6 % Final     Monocytes %   Date Value Ref Range Status   06/27/2020 5.3 % Final     Eosinophils %   Date Value Ref Range Status   06/27/2020 1.4 % Final     Basophils %   Date Value Ref Range Status   06/27/2020 1.2 % Final

## 2020-07-07 ENCOUNTER — Ambulatory Visit (INDEPENDENT_AMBULATORY_CARE_PROVIDER_SITE_OTHER): Payer: Medicaid Other | Admitting: Bariatrics

## 2020-07-07 ENCOUNTER — Encounter (INDEPENDENT_AMBULATORY_CARE_PROVIDER_SITE_OTHER): Payer: Self-pay | Admitting: Bariatrics

## 2020-07-07 ENCOUNTER — Other Ambulatory Visit: Payer: Self-pay

## 2020-07-07 VITALS — BP 107/71 | HR 83 | Temp 98.3°F | Ht 60.0 in | Wt 163.0 lb

## 2020-07-07 DIAGNOSIS — I10 Essential (primary) hypertension: Secondary | ICD-10-CM | POA: Diagnosis not present

## 2020-07-07 DIAGNOSIS — E669 Obesity, unspecified: Secondary | ICD-10-CM | POA: Diagnosis not present

## 2020-07-07 DIAGNOSIS — E559 Vitamin D deficiency, unspecified: Secondary | ICD-10-CM

## 2020-07-07 DIAGNOSIS — Z6832 Body mass index (BMI) 32.0-32.9, adult: Secondary | ICD-10-CM

## 2020-07-07 NOTE — Progress Notes (Signed)
Chief Complaint:   OBESITY [name] is here to discuss her progress with her obesity treatment plan along with follow-up of her obesity related diagnoses. [name] is on the Category 1 Plan and states she is following her eating plan approximately 75% of the time. [name] states she walked 4 miles [place] the [place].   Today's visit was #: 5 Starting weight: 166 lbs Starting date: 04/18/2020 Today's weight: 163 lbs Today's date: 07/07/2020 Total lbs lost to date: 3 Total lbs lost since last in-office visit: 0  Interim History: [name] is up 3 lbs, but has done fairly well overall. She reports still drinking soda and is skipping meals.  Subjective:   Essential hypertension. [name] is taking Norvasc, Catapres, and aldactone.  BP Readings from Last 3 Encounters:  07/07/20 107/71  06/22/20 114/82  06/16/20 113/79   Lab Results  Component Value Date   CREATININE 0.63 01/25/2020   CREATININE 0.69 01/08/2019   CREATININE 0.69 11/28/2017   Vitamin D deficiency. No nausea, vomiting, or muscle weakness.    Ref. Range 04/18/2020 13:59  Vitamin D, 25-Hydroxy Latest Ref Range: 30.0 - 100.0 ng/mL 27.0 (L)   Assessment/Plan:   Essential hypertension. [name] is working on healthy weight loss and exercise to improve blood pressure control. We will watch for signs of hypotension as she continues her lifestyle modifications. [name] will continue her medications as directed.  Vitamin D deficiency. Low Vitamin D level contributes to fatigue and are associated with obesity, breast, and colon cancer. She agrees to continue to take Vitamin D as directed and will follow-up for routine testing of Vitamin D, at least 2-3 times per year to avoid over-replacement.  Class 1 obesity with serious comorbidity and body mass index (BMI) of 32.0 to 32.9 in adult, unspecified obesity type.  [name] is currently in the action stage of change. As such, her goal is to continue with weight loss efforts.  She has agreed to the Category 1 Plan.   She will work on meal planning, intentional eating, and increasing her water (sparkling) intake.   Exercise goals: [name] will continue to walk and increase over time.  Behavioral modification strategies: increasing lean protein intake, decreasing simple carbohydrates, increasing vegetables, increasing water intake, decreasing eating out, no skipping meals, meal planning and cooking strategies, keeping healthy foods in the home and planning for success.  [name] has agreed to follow-up with our clinic in 3 weeks. She was informed of the importance of frequent follow-up visits to maximize her success with intensive lifestyle modifications for her multiple health conditions.   Objective:   Blood pressure 107/71, pulse 83, temperature 98.3 F (36.8 C), height 5' (1.524 m), weight 163 lb (73.9 kg), SpO2 99 %. Body mass index is 31.83 kg/m.  General: Cooperative, alert, well developed, in no acute distress. HEENT: Conjunctivae and lids unremarkable. Cardiovascular: Regular rhythm.  Lungs: Normal work of breathing. Neurologic: No focal deficits.   Lab Results  Component Value Date   CREATININE 0.63 01/25/2020   BUN 7 01/25/2020   NA 139 01/25/2020   K 4.1 01/25/2020   CL 108 (H) 01/25/2020   CO2 19 (L) 01/25/2020   Lab Results  Component Value Date   ALT 16 01/25/2020   AST 16 01/25/2020   ALKPHOS 76 01/25/2020   BILITOT 0.3 01/25/2020   Lab Results  Component Value Date   HGBA1C 5.3 01/25/2020   Lab Results  Component Value Date   INSULIN 8.4 04/18/2020  Lab Results  Component Value Date   TSH 2.790 04/18/2020   Lab Results  Component Value Date   CHOL 120 01/25/2020   HDL 28 (L) 01/25/2020   LDLCALC 66 01/25/2020   TRIG 150 (H) 01/25/2020   CHOLHDL 4.3 01/25/2020   Lab Results  Component Value Date   WBC 9.1 01/25/2020   HGB 14.8 01/25/2020   HCT 44.6 01/25/2020   MCV 91 01/25/2020   PLT 262 01/25/2020   No  results found for: IRON, TIBC, FERRITIN  Attestation Statements:   Reviewed by clinician on day of visit: allergies, medications, problem list, medical history, surgical history, family history, social history, and previous encounter notes.  Time spent on visit including pre-visit chart review and post-visit charting and care was 20 minutes.   Migdalia Dk, am acting as Location manager for [employer], DO   I have reviewed the above documentation for accuracy and completeness, and I agree with the above. Jearld Lesch, DO

## 2020-07-07 NOTE — Unmapped (Signed)
[facility] Refill Coordination Note    Specialty Medication(s) to be Shipped:   Inflammatory Disorders: Humira    Other medication(s) to be shipped: N/A     Kim Charles, DOB: 09-28-1981  Phone: [phone] (home)       All above HIPAA information was verified with patient.     Was a Nurse, learning disability used for this call? No    Completed refill call assessment today to schedule patient's medication shipment from the [facility] [phone]).       Specialty medication(s) and dose(s) confirmed: Regimen is correct and unchanged.   Changes to medications: [name] reports no changes at this time.  Changes to insurance: No  Questions for the pharmacist: No    Confirmed patient received Welcome Packet with first shipment. The patient will receive a drug information handout for each medication shipped and additional FDA Medication Guides as required.       DISEASE/MEDICATION-SPECIFIC INFORMATION        For patients on injectable medications: Patient currently has 1 doses left.  Next injection is scheduled for 07/08/2020.    SPECIALTY MEDICATION ADHERENCE     Medication Adherence    Patient reported X missed doses in the last month: 0  Specialty Medication: Humira CF 40 mg/0.4 ml   Patient is on additional specialty medications: No          SHIPPING     Shipping address confirmed in Epic.     Delivery Scheduled: Yes, Expected medication delivery date: 07/12/2020.     Medication will be delivered via UPS to the prescription address in Epic WAM.    Kim Charles [facility] Specialty Technician

## 2020-07-11 MED FILL — HUMIRA PEN CITRATE FREE 40 MG/0.4 ML: SUBCUTANEOUS | 28 days supply | Qty: 4 | Fill #1

## 2020-07-11 MED FILL — HUMIRA PEN CITRATE FREE 40 MG/0.4 ML: 28 days supply | Qty: 4 | Fill #1 | Status: AC

## 2020-07-12 ENCOUNTER — Encounter: Payer: Self-pay | Admitting: Physical Therapy

## 2020-07-12 ENCOUNTER — Other Ambulatory Visit: Payer: Self-pay

## 2020-07-12 ENCOUNTER — Other Ambulatory Visit: Payer: Self-pay | Admitting: Nurse Practitioner

## 2020-07-12 ENCOUNTER — Encounter
Payer: Medicaid Other | Attending: Physical Medicine and Rehabilitation | Admitting: Physical Medicine and Rehabilitation

## 2020-07-12 ENCOUNTER — Encounter: Payer: Self-pay | Admitting: Physical Medicine and Rehabilitation

## 2020-07-12 ENCOUNTER — Ambulatory Visit: Payer: Medicaid Other | Attending: Physical Medicine and Rehabilitation | Admitting: Physical Therapy

## 2020-07-12 VITALS — BP 125/89 | HR 100 | Temp 99.8°F | Ht 60.0 in | Wt 166.0 lb

## 2020-07-12 DIAGNOSIS — R293 Abnormal posture: Secondary | ICD-10-CM

## 2020-07-12 DIAGNOSIS — M797 Fibromyalgia: Secondary | ICD-10-CM | POA: Diagnosis present

## 2020-07-12 DIAGNOSIS — M4120 Other idiopathic scoliosis, site unspecified: Secondary | ICD-10-CM | POA: Insufficient documentation

## 2020-07-12 DIAGNOSIS — M5442 Lumbago with sciatica, left side: Secondary | ICD-10-CM | POA: Diagnosis present

## 2020-07-12 DIAGNOSIS — M6281 Muscle weakness (generalized): Secondary | ICD-10-CM | POA: Diagnosis present

## 2020-07-12 DIAGNOSIS — M25512 Pain in left shoulder: Secondary | ICD-10-CM | POA: Diagnosis present

## 2020-07-12 DIAGNOSIS — M25511 Pain in right shoulder: Secondary | ICD-10-CM | POA: Diagnosis present

## 2020-07-12 DIAGNOSIS — G8929 Other chronic pain: Secondary | ICD-10-CM | POA: Insufficient documentation

## 2020-07-12 DIAGNOSIS — Z79891 Long term (current) use of opiate analgesic: Secondary | ICD-10-CM | POA: Insufficient documentation

## 2020-07-12 DIAGNOSIS — M329 Systemic lupus erythematosus, unspecified: Secondary | ICD-10-CM | POA: Insufficient documentation

## 2020-07-12 DIAGNOSIS — R800 Isolated proteinuria: Secondary | ICD-10-CM | POA: Diagnosis not present

## 2020-07-12 DIAGNOSIS — M5136 Other intervertebral disc degeneration, lumbar region: Secondary | ICD-10-CM | POA: Insufficient documentation

## 2020-07-12 DIAGNOSIS — I10 Essential (primary) hypertension: Secondary | ICD-10-CM | POA: Insufficient documentation

## 2020-07-12 DIAGNOSIS — R2689 Other abnormalities of gait and mobility: Secondary | ICD-10-CM | POA: Insufficient documentation

## 2020-07-12 DIAGNOSIS — M5441 Lumbago with sciatica, right side: Secondary | ICD-10-CM | POA: Diagnosis present

## 2020-07-12 DIAGNOSIS — R339 Retention of urine, unspecified: Secondary | ICD-10-CM | POA: Diagnosis not present

## 2020-07-12 DIAGNOSIS — J302 Other seasonal allergic rhinitis: Secondary | ICD-10-CM

## 2020-07-12 DIAGNOSIS — Z5181 Encounter for therapeutic drug level monitoring: Secondary | ICD-10-CM | POA: Diagnosis present

## 2020-07-12 DIAGNOSIS — M5440 Lumbago with sciatica, unspecified side: Secondary | ICD-10-CM | POA: Diagnosis present

## 2020-07-12 DIAGNOSIS — R600 Localized edema: Secondary | ICD-10-CM

## 2020-07-12 MED ORDER — GABAPENTIN 300 MG PO CAPS: 300.0000 mg | ORAL_CAPSULE | Freq: Three times a day (TID) | ORAL | 5 refills | Status: DC

## 2020-07-12 MED ORDER — HYDROCODONE-ACETAMINOPHEN 10-325 MG PO TABS: 1.0000 | ORAL_TABLET | Freq: Every day | ORAL | 0 refills | Status: DC | PRN

## 2020-07-12 MED ORDER — FUROSEMIDE 20 MG PO TABS
20.0000 mg | ORAL_TABLET | Freq: Three times a day (TID) | ORAL | 3 refills | Status: DC | PRN
Start: 2020-07-12 — End: 2020-09-13
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
## 2020-07-12 NOTE — Progress Notes (Signed)
Subjective:    Patient ID: [name], female    DOB: 19-Oct-1981, 39 y.o.   MRN: [id number]  HPI  She previously fell on her back twice. No falls since last visit. Provided refill of her Norco- this has been controlling her pain well. On her second visit she brought in a letter from her prior clinic that says they will no longer be prescribing narcotics,  She is going to [facility] with her husband this month. Has been having a good quality of life and has been functional on current dose of Norco.    She continues to have lower back pain that radiates into her bilateral legs.  She has never had spinal injections in the past.   Since last visit she has been experiencing a lupus flare. She has a malar rash. She finds her lupus medications have been less effective.   Current pain has decreased from 7 to 5.   She notes worsening lower extremity edema which she feels may be due to her Lupus. She is on spironolactone and I have prescribed Lasix. Recommended that she ice legs, elevated, and wear compression garments.   She also notes urinary retention and would like a referral to urology.   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   5/12 Initial history: Mrs. Stitt is a 39 year old woman who presents with diffuse joint pain. She has a history of fibromyalgia, Lupus, scoliosis, Sjogren's Disease, and lumbar disc disease.   She wants to go back to work and get off of disability. She had just graduated when she was diagnosed with Lupus in 2016. She was depressed at that time but feels much better now.   Her current pain is 7/10. The pain is achy. She has had hip injections that last about 6 months. She takes Hydrocodone 10mg /325mg  about 4 times per day. She tries heat and soaks in Epsom salt. She uses a heating pad. She has tried physical therapy, TENS unit. She has tried Lyrica and did not do well with this. She currently takes Gabapentin which does help with her nerve pain.     She follows with psychology and takes Vyvanse for ADHD. She recently decreased dose to 30mg  as it was causing her heart palpitations.   She has two children aged 97 and 69.  Pain Inventory Average Pain 5 Pain Right Now 3 My pain is sharp, burning, dull, stabbing, tingling and aching  In the last 24 hours, has pain interfered with the following? General activity 7 Relation with others 0 Enjoyment of life 10 What TIME of day is your pain at its worst? morning, night  Sleep (in general) Poor  Pain is worse with: bending, sitting and standing Pain improves with: heat/ice, therapy/exercise and medication Relief from Meds: 9  Mobility walk with assistance use a cane ability to climb steps?  yes do you drive?  yes  Function not employed: date last employed . disabled: date disabled . I need assistance with the following:  household duties and shopping Do you have any goals in this area?  yes  Neuro/Psych numbness tingling spasms  Prior Studies Any changes since last visit?  no  Physicians involved in your care Any changes since last visit?  no   Family History  Problem Relation Age of Onset  . Heart disease Mother   . Diabetes Mother   . Hypertension Mother   . High Cholesterol Mother   . Thyroid disease Mother   .  Depression Mother   . Obesity Mother   . Heart disease Father   . Diabetes Father   . Hypertension Father   . High Cholesterol Father   . Alcoholism Father   . Bipolar disorder Brother   . Cancer Brother        prostate  . Pancreatic cancer Maternal Grandmother   . Cancer Maternal Grandmother        pancreatic  . Cancer Maternal Uncle        lung  . Cancer Paternal Aunt   . Bipolar disorder Sister    Social History   Socioeconomic History  . Marital status: Married    Spouse name: Larkin Ina  . Number of children: Not on file  . Years of education: Not on file  . Highest education level: Not on file  Occupational History  .  Occupation: stay at home mom  Tobacco Use  . Smoking status: Current Every Day Smoker    Packs/day: 1.00    Years: 11.00    Pack years: 11.00    Types: Cigarettes  . Smokeless tobacco: Never Used  . Tobacco comment: too much stress  Vaping Use  . Vaping Use: Never used  Substance and Sexual Activity  . Alcohol use: No  . Drug use: No  . Sexual activity: Yes    Birth control/protection: Surgical  Other Topics Concern  . Not on file  Social History Narrative   At [employer] wanting to get apply to nursing school.  Plans to apply 2/13.     Social Determinants of Health   Financial Resource Strain:   . Difficulty of Paying Living Expenses:   Food Insecurity:   . Worried About Charity fundraiser in the Last Year:   . Arboriculturist in the Last Year:   Transportation Needs:   . Film/video editor (Medical):   Marland Kitchen Lack of Transportation (Non-Medical):   Physical Activity:   . Days of Exercise per Week:   . Minutes of Exercise per Session:   Stress:   . Feeling of Stress :   Social Connections:   . Frequency of Communication with Friends and Family:   . Frequency of Social Gatherings with Friends and Family:   . Attends Religious Services:   . Active Member of Clubs or Organizations:   . Attends Archivist Meetings:   Marland Kitchen Marital Status:    Past Surgical History:  Procedure Laterality Date  . ABDOMINAL HYSTERECTOMY  06/30/2008   partial  . AXILLARY HIDRADENITIS EXCISION     bilateral  . CESAREAN SECTION  08/31/2006, 08/26/2007  . LAPAROSCOPIC ENDOMETRIOSIS FULGURATION  06/1999  . MASS EXCISION  06/19/2012   Procedure: EXCISION MASS;  Surgeon: Merrie Roof, MD;  Location: [facility];  Service: General;  Laterality: Right;  excision mass right abdominal wall   Past Medical History:  Diagnosis Date  . Abdominal wall mass   . ADHD (attention deficit hyperactivity disorder)   . Anemia   . Arthritis   . Asthma   . Back pain   . Chest pain   .  Depression    tried several meds, not on any now  . Diarrhea   . Endometriosis   . Headache(784.0)   . Hydradenitis   . Hydradenitis 07/13/2011  . Hypertension    on inderal  . Leukemia ([place]) at age 33  . Lupus ([place])   . Migraines   . MRSA (methicillin resistant staph aureus) culture  positive 2010   ear infection that cultured out MRSA   . Nausea   . S/P total abdominal hysterectomy 06/26/2011  . Scoliosis   . Thyroid disease    BP 125/89   Pulse 100   Temp 99.8 F (37.7 C)   Ht 5' (1.524 m)   Wt 166 lb (75.3 kg)   SpO2 99%   BMI 32.42 kg/m   Opioid Risk Score:   Fall Risk Score:  `1  Depression screen PHQ 2/9  Depression screen [place] 2/9 05/11/2020 04/18/2020 03/18/2020 05/06/2012  Decreased Interest 3 3 1 1   Down, Depressed, Hopeless 0 3 0 1  PHQ - 2 Score 3 6 1 2   Altered sleeping 3 3 1  -  Tired, decreased energy 3 3 3  -  Change in appetite 0 3 0 -  Feeling bad or failure about yourself  0 3 1 -  Trouble concentrating 3 3 1  -  Moving slowly or fidgety/restless 0 0 0 -  Suicidal thoughts 0 0 0 -  PHQ-9 Score 12 21 7  -  Difficult doing work/chores - Somewhat difficult - -    Review of Systems  Constitutional: Negative.   HENT: Negative.   Eyes: Negative.   Respiratory: Negative.   Cardiovascular: Positive for leg swelling.  Gastrointestinal: Negative.   Genitourinary: Negative.   Musculoskeletal: Positive for arthralgias and back pain.       Spasms   Skin: Negative.   Allergic/Immunologic: Negative.   Neurological: Positive for numbness.       Tingling  Hematological: Negative.   Psychiatric/Behavioral: Negative.   All other systems reviewed and are negative.      Objective:   Physical Exam Gen: no distress, normal appearing HEENT: oral mucosa pink and moist, NCAT Cardio: Reg rate Chest: normal effort, normal rate of breathing Abd: soft, non-distended Ext: no edema Skin: intact Neuro:AOx3. Musculoskeletal: Tenderness to palpation of bilateral  shoulders. Tenderness to palpation of bilateral lumbar paraspinals.  Psych: pleasant, normal affect, tangential and times.      Assessment & Plan:  Mrs. Bucher is a 39 year old woman who presents with diffuse joint pain. She has a history of fibromyalgia, Lupus, scoliosis, Sjogren's Disease, and lumbar disc disease.    Fibromyalgia There is presence of widespread pain above and below the waist on both sides for more than 3 months that is consistent with fibromyalgia. There are related symptoms of fatigue and cognitive dysfunction.    Recommended 30 minutes of aerobic exercise 5 times per week and resistance exercise of all major muscle groups at least twice per week. Educated patient that this is a crucial component of recovery and that self-responsibility is key. She is already very good about going to [employer] with her daughter 5x per week which is excellent!   Continue phsychology visits which appear to be greatly helping her.    Lumbar disc degeneration: -Appears to be a component of neuropathic pain in both legs, no evidence of radiculopathy or stenosis on 2016 MRI. Ordered lumbar XR to assess for stenosis since it has been 5 years since last imaging. It showed no acute findings with mild convex rightward lumbar scoliosis- discussed with patient. -Prescribed physical therapy for core and paraspinal stretching and strengthening.  -Pain contract and UDS signed first visit.   -Refilled Norco today. May take up to 5 times per day as needed.    Quality of Life: -Continue to encourage return to work, which is one of patient's goals. She is going  to the beach with her family in July which she is very excited about.    Hypotension: -Decreased amlodipine to 5mg . Provided script. Log pressures and bring to f/u appointment. Amlodipine may also be contributing to leg swelling. Hopefully with more exercise and better nutrition we can get her off this medication.    Vitamin D deficiency:  continue 50,000U weekly. Advised that deficiency can also contribute to pain.   Bilateral shoulder pain: Discussed results of shoulder XRs with patient- both were stable.   Leg swelling: Advised ice, compression garments, elevation. Continue spironolactone. Prescribed Lasix needed. Bmp obtained to check creatinine.   Obesity: Advised low carb diet. Patient eager to follow and will involve her husband who is very supportive.    All questions answered. RTC in 1 month.

## 2020-07-12 NOTE — Patient Instructions (Addendum)
Sleeping on Back  Place pillow under knees. A pillow with cervical support and a roll around waist are also helpful. Copyright  VHI. All rights reserved.  Sleeping on Side Place pillow between knees. Use cervical support under neck and a roll around waist as needed. Copyright  VHI. All rights reserved.   Sleeping on Stomach   If this is the only desirable sleeping position, place pillow under lower legs, and under stomach or chest as needed.  Posture - Sitting   Sit upright, head facing forward. Try using a roll to support lower back. Keep shoulders relaxed, and avoid rounded back. Keep hips level with knees. Avoid crossing legs for long periods. Stand to Sit / Sit to Stand   To sit: Bend knees to lower self onto front edge of chair, then scoot back on seat. To stand: Reverse sequence by placing one foot forward, and scoot to front of seat. Use rocking motion to stand up.   Work Height and Reach  Ideal work height is no more than 2 to 4 inches below elbow level when standing, and at elbow level when sitting. Reaching should be limited to arm's length, with elbows slightly bent.  Bending  Bend at hips and knees, not back. Keep feet shoulder-width apart.    Posture - Standing   Good posture is important. Avoid slouching and forward head thrust. Maintain curve in low back and align ears over shoul- ders, hips over ankles.  Alternating Positions   Alternate tasks and change positions frequently to reduce fatigue and muscle tension. Take rest breaks. Computer Work   Position work to face forward. Use proper work and seat height. Keep shoulders back and down, wrists straight, and elbows at right angles. Use chair that provides full back support. Add footrest and lumbar roll as needed.  Getting Into / Out of Car  Lower self onto seat, scoot back, then bring in one leg at a time. Reverse sequence to get out.  Dressing  Lie on back to pull socks or slacks over feet, or sit  and bend leg while keeping back straight.    Housework - Sink  Place one foot on ledge of cabinet under sink when standing at sink for prolonged periods.   Pushing / Pulling  Pushing is preferable to pulling. Keep back in proper alignment, and use leg muscles to do the work.  Deep Squat   Squat and lift with both arms held against upper trunk. Tighten stomach muscles without holding breath. Use smooth movements to avoid jerking.  Avoid Twisting   Avoid twisting or bending back. Pivot around using foot movements, and bend at knees if needed when reaching for articles.  Carrying Luggage   Distribute weight evenly on both sides. Use a cart whenever possible. Do not twist trunk. Move body as a unit.   Lifting Principles .Maintain proper posture and head alignment. .Slide object as close as possible before lifting. .Move obstacles out of the way. .Test before lifting; ask for help if too heavy. .Tighten stomach muscles without holding breath. .Use smooth movements; do not jerk. .Use legs to do the work, and pivot with feet. .Distribute the work load symmetrically and close to the center of trunk. .Push instead of pull whenever possible.   Ask For Help   Ask for help and delegate to others when possible. Coordinate your movements when lifting together, and maintain the low back curve.  Log Roll   Lying on back, bend left knee and place left   arm across chest. Roll all in one movement to the right. Reverse to roll to the left. Always move as one unit. Housework - Sweeping  Use long-handled equipment to avoid stooping.   Housework - Wiping  Position yourself as close as possible to reach work surface. Avoid straining your back.  Laundry - Unloading Wash   To unload small items at bottom of washer, lift leg opposite to arm being used to reach.  [place] close to area to be raked. Use arm movements to do the work. Keep back straight and avoid  twisting.     Cart  When reaching into cart with one arm, lift opposite leg to keep back straight.   Getting Into / Out of Bed  Lower self to lie down on one side by raising legs and lowering head at the same time. Use arms to assist moving without twisting. Bend both knees to roll onto back if desired. To sit up, start from lying on side, and use same move-ments in reverse. Housework - Vacuuming  Hold the vacuum with arm held at side. Step back and forth to move it, keeping head up. Avoid twisting.   Laundry - IT consultant so that bending and twisting can be avoided.   Laundry - Unloading Dryer  Squat down to reach into clothes dryer or use a reacher.  Gardening - Weeding / Probation officer or Kneel. Knee pads may be helpful.                      WALKING  Walking is a great form of exercise to increase your strength, endurance and overall fitness.  A walking program can help you start slowly and gradually build endurance as you go.  Everyone's ability is different, so each person's starting point will be different.  You do not have to follow them exactly.  The are just samples. You should simply find out what's right for you and stick to that program.   In the beginning, you'll start off walking 2-3 times a day for short distances.  As you get stronger, you'll be walking further at just 1-2 times per day.  You need 150 min to 300 min at least a week  That is 30 to 97min of walking 5 days a weeks   A. You Can Walk For A Certain Length Of Time Each Day    Walk 5 minutes 3 times per day.  Increase 2 minutes every 2 days (3 times per day).  Work up to 25-30 minutes (1-2 times per day).   Example:   Day 1-2 5 minutes 3 times per day   Day 7-8 12 minutes 2-3 times per day   Day 13-14 25 minutes 1-2 times per day  B. You Can Walk For a Certain Distance Each Day     Distance can be substituted for time.    Example:   3 trips to  mailbox (at road)   3 trips to corner of block   3 trips around the block  C. Go to local [employer] and use the track.    Walk for distance ____ around track  Or time ____ minutes  D. Walk _x___ Jog ____ Run ___  Please only do the exercises that your therapist has initialed and dated  Voncille Lo, PT Certified Exercise Expert for the Aging Adult  07/12/20 3:27 PM Phone: [phone] Fax: [phone]

## 2020-07-13 ENCOUNTER — Encounter: Payer: Self-pay | Admitting: Physical Medicine and Rehabilitation

## 2020-07-13 LAB — BASIC METABOLIC PANEL
BUN/Creatinine Ratio: 12 (ref 9–23)
BUN: 9 mg/dL (ref 6–20)
CO2: 19 mmol/L — ABNORMAL LOW (ref 20–29)
Calcium: 9 mg/dL (ref 8.7–10.2)
Chloride: 105 mmol/L (ref 96–106)
Creatinine, Ser: 0.77 mg/dL (ref 0.57–1.00)
GFR calc Af Amer: 113 mL/min/{1.73_m2} (ref >59)
GFR calc non Af Amer: 98 mL/min/{1.73_m2} (ref >59)
Glucose: 72 mg/dL (ref 65–99)
Potassium: 4.4 mmol/L (ref 3.5–5.2)
Sodium: 140 mmol/L (ref 134–144)

## 2020-07-26 ENCOUNTER — Other Ambulatory Visit: Payer: Self-pay

## 2020-07-26 ENCOUNTER — Encounter: Payer: Self-pay | Admitting: Physical Therapy

## 2020-07-26 ENCOUNTER — Ambulatory Visit: Payer: Medicaid Other | Admitting: Physical Therapy

## 2020-07-26 DIAGNOSIS — M797 Fibromyalgia: Secondary | ICD-10-CM

## 2020-07-26 DIAGNOSIS — G8929 Other chronic pain: Secondary | ICD-10-CM

## 2020-07-26 DIAGNOSIS — M6281 Muscle weakness (generalized): Secondary | ICD-10-CM

## 2020-07-26 DIAGNOSIS — R293 Abnormal posture: Secondary | ICD-10-CM

## 2020-07-26 DIAGNOSIS — M5440 Lumbago with sciatica, unspecified side: Secondary | ICD-10-CM | POA: Diagnosis not present

## 2020-07-26 NOTE — Patient Instructions (Signed)
   [name], PT Certified Exercise Expert for the Aging Adult  07/26/20 3:50 PM Phone: [phone] Fax: [phone]
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
## 2020-07-28 ENCOUNTER — Encounter (INDEPENDENT_AMBULATORY_CARE_PROVIDER_SITE_OTHER): Payer: Self-pay | Admitting: Bariatrics

## 2020-07-28 ENCOUNTER — Ambulatory Visit (INDEPENDENT_AMBULATORY_CARE_PROVIDER_SITE_OTHER): Payer: Medicaid Other | Admitting: Bariatrics

## 2020-07-28 ENCOUNTER — Other Ambulatory Visit: Payer: Self-pay

## 2020-07-28 VITALS — BP 129/88 | HR 87 | Temp 98.0°F | Ht 60.0 in | Wt 164.0 lb

## 2020-07-28 DIAGNOSIS — M25561 Pain in right knee: Secondary | ICD-10-CM | POA: Diagnosis not present

## 2020-07-28 DIAGNOSIS — E8881 Metabolic syndrome: Secondary | ICD-10-CM

## 2020-07-28 DIAGNOSIS — Z6832 Body mass index (BMI) 32.0-32.9, adult: Secondary | ICD-10-CM

## 2020-07-28 DIAGNOSIS — I10 Essential (primary) hypertension: Secondary | ICD-10-CM

## 2020-07-28 DIAGNOSIS — G8929 Other chronic pain: Secondary | ICD-10-CM

## 2020-07-28 DIAGNOSIS — E559 Vitamin D deficiency, unspecified: Secondary | ICD-10-CM | POA: Diagnosis not present

## 2020-07-28 DIAGNOSIS — E669 Obesity, unspecified: Secondary | ICD-10-CM

## 2020-07-28 DIAGNOSIS — M25562 Pain in left knee: Secondary | ICD-10-CM

## 2020-07-28 MED ORDER — VITAMIN D (ERGOCALCIFEROL) 1.25 MG (50000 UNIT) PO CAPS: 50000.0000 [IU] | ORAL_CAPSULE | ORAL | 0 refills | Status: DC

## 2020-07-28 NOTE — Progress Notes (Signed)
Chief Complaint:   OBESITY [name] is here to discuss her progress with her obesity treatment plan along with follow-up of her obesity related diagnoses. [name] is on the Category 1 Plan and states she is following her eating plan approximately 50% of the time. [name] states she is walking 30 minutes 5 times per week.  Today's visit was #: 6 Starting weight: 166 lbs Starting date: 04/18/2020 Today's weight: 164 lbs Today's date: 07/28/2020 Total lbs lost to date: 2 Total lbs lost since last in-office visit: 0  Interim History: [name] is up 1 lb. She thinks that she is retaining fluid and taking Lasix. She thinks that she is getting adequate protein.  Subjective:   Essential hypertension. Blood pressure is reasonably controlled; diastolic is slightly elevated today.  BP Readings from Last 3 Encounters:  07/28/20 (!) 129/88  07/12/20 125/89  07/07/20 107/71   Lab Results  Component Value Date   CREATININE 0.77 07/12/2020   CREATININE 0.63 01/25/2020   CREATININE 0.69 01/08/2019   Bilateral chronic knee pain. [name] denies increased pain.  Vitamin D deficiency. [name] is taking Vitamin D supplementation.    Ref. Range 04/18/2020 13:59  Vitamin D, 25-Hydroxy Latest Ref Range: 30.0 - 100.0 ng/mL 27.0 (L)   Insulin resistance. [name] has a diagnosis of insulin resistance based on her elevated fasting insulin level >5. She continues to work on diet and exercise to decrease her risk of diabetes.[name] is taking metformin.  Lab Results  Component Value Date   INSULIN 8.4 04/18/2020   Lab Results  Component Value Date   HGBA1C 5.3 01/25/2020   Assessment/Plan:   Essential hypertension. [name] is working on healthy weight loss and exercise to improve blood pressure control. We will watch for signs of hypotension as she continues her lifestyle modifications. She will continue her medication as directed. Comprehensive metabolic panel, Lipid Panel With LDL/HDL Ratio  labs will be checked today.   Bilateral chronic knee pain. [name] was advised to avoid pounding exercises.  Vitamin D deficiency. Low Vitamin D level contributes to fatigue and are associated with obesity, breast, and colon cancer. She was given a prescription for Vitamin D, Ergocalciferol, (DRISDOL) 1.25 MG (50000 UNIT) CAPS capsule every week #4 with 0 refills and VITAMIN D 25 Hydroxy (Vit-D Deficiency, Fractures) level will be checked today.  Insulin resistance. [name] will continue to work on weight loss, exercise, and decreasing simple carbohydrates to help decrease the risk of diabetes. [name] agreed to follow-up with [place] as directed to closely monitor her progress. Hemoglobin A1c, Insulin, random will be checked today.  Class 1 obesity with serious comorbidity and body mass index (BMI) of 32.0 to 32.9 in adult, unspecified obesity type.  [name] is currently in the action stage of change. As such, her goal is to continue with weight loss efforts. She has agreed to the Category 1 Plan.   She will work on increasing her water intake.  We discussed My Fitness Pal.  Handouts were provided on Journaling and Protein.  Exercise goals: All adults should avoid inactivity. Some physical activity is better than none, and adults who participate in any amount of physical activity gain some health benefits.  Behavioral modification strategies: increasing lean protein intake, decreasing simple carbohydrates, increasing vegetables, increasing water intake, decreasing eating out, no skipping meals, meal planning and cooking strategies and keeping healthy foods in the home.  [name] has agreed to follow-up with our clinic in 3 weeks. She was informed of the importance  of frequent follow-up visits to maximize her success with intensive lifestyle modifications for her multiple health conditions.   [name] was informed we would discuss her lab results at her next visit unless there is a critical issue that  needs to be addressed sooner. [name] agreed to keep her next visit at the agreed upon time to discuss these results.  Objective:   Blood pressure (!) 129/88, pulse 87, temperature 98 F (36.7 C), height 5' (1.524 m), weight 164 lb (74.4 kg), SpO2 96 %. Body mass index is 32.03 kg/m.  General: Cooperative, alert, well developed, in no acute distress. HEENT: Conjunctivae and lids unremarkable. Cardiovascular: Regular rhythm.  Lungs: Normal work of breathing. Neurologic: No focal deficits.   Lab Results  Component Value Date   CREATININE 0.77 07/12/2020   BUN 9 07/12/2020   NA 140 07/12/2020   K 4.4 07/12/2020   CL 105 07/12/2020   CO2 19 (L) 07/12/2020   Lab Results  Component Value Date   ALT 16 01/25/2020   AST 16 01/25/2020   ALKPHOS 76 01/25/2020   BILITOT 0.3 01/25/2020   Lab Results  Component Value Date   HGBA1C 5.3 01/25/2020   Lab Results  Component Value Date   INSULIN 8.4 04/18/2020   Lab Results  Component Value Date   TSH 2.790 04/18/2020   Lab Results  Component Value Date   CHOL 120 01/25/2020   HDL 28 (L) 01/25/2020   LDLCALC 66 01/25/2020   TRIG 150 (H) 01/25/2020   CHOLHDL 4.3 01/25/2020   Lab Results  Component Value Date   WBC 9.1 01/25/2020   HGB 14.8 01/25/2020   HCT 44.6 01/25/2020   MCV 91 01/25/2020   PLT 262 01/25/2020   No results found for: IRON, TIBC, FERRITIN  Attestation Statements:   Reviewed by clinician on day of visit: allergies, medications, problem list, medical history, surgical history, family history, social history, and previous encounter notes.  Migdalia Dk, am acting as Location manager for [employer], DO   I have reviewed the above documentation for accuracy and completeness, and I agree with the above. Jearld Lesch, DO

## 2020-07-29 LAB — LIPID PANEL WITH LDL/HDL RATIO
Cholesterol, Total: 161 mg/dL (ref 100–199)
HDL: 31 mg/dL — ABNORMAL LOW (ref >39)
LDL Chol Calc (NIH): 107 mg/dL — ABNORMAL HIGH (ref 0–99)
LDL/HDL Ratio: 3.5 ratio — ABNORMAL HIGH (ref 0.0–3.2)
Triglycerides: 128 mg/dL (ref 0–149)
VLDL Cholesterol Cal: 23 mg/dL (ref 5–40)

## 2020-07-29 LAB — COMPREHENSIVE METABOLIC PANEL
ALT: 14 IU/L (ref 0–32)
AST: 12 IU/L (ref 0–40)
Albumin/Globulin Ratio: 1.9 (ref 1.2–2.2)
Albumin: 4.3 g/dL (ref 3.8–4.8)
Alkaline Phosphatase: 85 IU/L (ref 48–121)
BUN/Creatinine Ratio: 13 (ref 9–23)
BUN: 8 mg/dL (ref 6–20)
Bilirubin Total: 0.6 mg/dL (ref 0.0–1.2)
CO2: 22 mmol/L (ref 20–29)
Calcium: 9.3 mg/dL (ref 8.7–10.2)
Chloride: 104 mmol/L (ref 96–106)
Creatinine, Ser: 0.64 mg/dL (ref 0.57–1.00)
GFR calc Af Amer: 131 mL/min/{1.73_m2} (ref >59)
GFR calc non Af Amer: 114 mL/min/{1.73_m2} (ref >59)
Globulin, Total: 2.3 g/dL (ref 1.5–4.5)
Glucose: 88 mg/dL (ref 65–99)
Potassium: 4.5 mmol/L (ref 3.5–5.2)
Sodium: 141 mmol/L (ref 134–144)
Total Protein: 6.6 g/dL (ref 6.0–8.5)

## 2020-07-29 LAB — VITAMIN D 25 HYDROXY (VIT D DEFICIENCY, FRACTURES): Vit D, 25-Hydroxy: 46.8 ng/mL (ref 30.0–100.0)

## 2020-07-29 LAB — HEMOGLOBIN A1C
Est. average glucose Bld gHb Est-mCnc: 103 mg/dL
Hgb A1c MFr Bld: 5.2 % (ref 4.8–5.6)

## 2020-07-29 LAB — INSULIN, RANDOM: INSULIN: 16.6 u[IU]/mL (ref 2.6–24.9)

## 2020-08-03 NOTE — Unmapped (Signed)
[facility] Refill Coordination Note    Specialty Medication(s) to be Shipped:   Inflammatory Disorders: Humira    Other medication(s) to be shipped: No additional medications requested for fill at this time     Kim Charles, DOB: 20-Jun-1981  Phone: [phone] (home)       All above HIPAA information was verified with patient.     Was a Nurse, learning disability used for this call? No    Completed refill call assessment today to schedule patient's medication shipment from the [facility] [phone]).       Specialty medication(s) and dose(s) confirmed: Regimen is correct and unchanged.   Changes to medications: Kim Charles reports no changes at this time.  Changes to insurance: No  Questions for the pharmacist: No    Confirmed patient received Welcome Packet with first shipment. The patient will receive a drug information handout for each medication shipped and additional FDA Medication Guides as required.       DISEASE/MEDICATION-SPECIFIC INFORMATION        For patients on injectable medications: Patient currently has 1 doses left.  Next injection is scheduled for 08/05/2020.    SPECIALTY MEDICATION ADHERENCE     Medication Adherence    Patient reported X missed doses in the last month: 0  Specialty Medication: Humira CF 40 mg/0.4 ml  Patient is on additional specialty medications: No  Any gaps in refill history greater than 2 weeks in the last 3 months: no  Demonstrates understanding of importance of adherence: yes  Informant: patient  Reliability of informant: reliable  Confirmed plan for next specialty medication refill: delivery by pharmacy  Refills needed for supportive medications: not needed                      SHIPPING     Shipping address confirmed in Epic.     Delivery Scheduled: Yes, Expected medication delivery date: 08/09/2020.     Medication will be delivered via UPS to the prescription address in Epic WAM.    Lynnetta Tom D Tap[place]   [facility] Specialty Technician

## 2020-08-08 ENCOUNTER — Other Ambulatory Visit: Payer: Self-pay | Admitting: Physical Medicine and Rehabilitation

## 2020-08-08 ENCOUNTER — Other Ambulatory Visit: Payer: Self-pay | Admitting: Family Medicine

## 2020-08-08 DIAGNOSIS — I10 Essential (primary) hypertension: Secondary | ICD-10-CM

## 2020-08-08 MED FILL — HUMIRA PEN CITRATE FREE 40 MG/0.4 ML: SUBCUTANEOUS | 28 days supply | Qty: 4 | Fill #2

## 2020-08-08 MED FILL — HUMIRA PEN CITRATE FREE 40 MG/0.4 ML: 28 days supply | Qty: 4 | Fill #2 | Status: AC

## 2020-08-11 ENCOUNTER — Encounter: Payer: Self-pay | Admitting: Physical Therapy

## 2020-08-11 ENCOUNTER — Ambulatory Visit: Payer: Medicaid Other | Attending: Physical Medicine and Rehabilitation | Admitting: Physical Therapy

## 2020-08-11 ENCOUNTER — Other Ambulatory Visit: Payer: Self-pay

## 2020-08-11 DIAGNOSIS — M6281 Muscle weakness (generalized): Secondary | ICD-10-CM

## 2020-08-11 DIAGNOSIS — M797 Fibromyalgia: Secondary | ICD-10-CM

## 2020-08-11 DIAGNOSIS — G8929 Other chronic pain: Secondary | ICD-10-CM | POA: Insufficient documentation

## 2020-08-11 DIAGNOSIS — R293 Abnormal posture: Secondary | ICD-10-CM

## 2020-08-11 DIAGNOSIS — M5442 Lumbago with sciatica, left side: Secondary | ICD-10-CM

## 2020-08-11 DIAGNOSIS — M5440 Lumbago with sciatica, unspecified side: Secondary | ICD-10-CM | POA: Diagnosis present
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
## 2020-08-15 DIAGNOSIS — I209 Angina pectoris, unspecified: Principal | ICD-10-CM

## 2020-08-15 DIAGNOSIS — I208 Other forms of angina pectoris: Principal | ICD-10-CM

## 2020-08-15 NOTE — Unmapped (Signed)
Called patient to follow-up from message that she sent. Reviewed labs from late June. dsDNA 1:80 and C3 and c4 low. CBC, Cr and AST and ALT wnl. Main issue has been exertional chest pain. Reports BP has been elevated. Chest pain subsided since last week but still aggravated with activity. Meeting with a provider to address her BP but not with her PCP. Unclear.  Last saw [facility] in 2018. Strong family history fo CAD both parents required open heart in their 37's. She is at risk with underlying autoimmune conditions. Will place referral to [facility]. Advised her to schedule within 4 weeks. She appreciated phone call.

## 2020-08-16 ENCOUNTER — Ambulatory Visit: Payer: Medicaid Other | Admitting: Physical Therapy

## 2020-08-18 ENCOUNTER — Ambulatory Visit: Payer: Medicaid Other | Admitting: Physical Therapy

## 2020-08-18 ENCOUNTER — Ambulatory Visit (INDEPENDENT_AMBULATORY_CARE_PROVIDER_SITE_OTHER): Payer: Medicaid Other | Admitting: Bariatrics

## 2020-08-23 ENCOUNTER — Other Ambulatory Visit: Payer: Self-pay

## 2020-08-23 ENCOUNTER — Ambulatory Visit: Payer: Medicaid Other | Admitting: Physical Therapy

## 2020-08-23 ENCOUNTER — Encounter
Payer: Medicaid Other | Attending: Physical Medicine and Rehabilitation | Admitting: Physical Medicine and Rehabilitation

## 2020-08-23 ENCOUNTER — Encounter: Payer: Self-pay | Admitting: Physical Medicine and Rehabilitation

## 2020-08-23 VITALS — BP 127/91 | HR 111 | Temp 98.9°F | Ht 60.0 in | Wt 168.0 lb

## 2020-08-23 DIAGNOSIS — M5442 Lumbago with sciatica, left side: Secondary | ICD-10-CM | POA: Insufficient documentation

## 2020-08-23 DIAGNOSIS — R293 Abnormal posture: Secondary | ICD-10-CM

## 2020-08-23 DIAGNOSIS — R2689 Other abnormalities of gait and mobility: Secondary | ICD-10-CM | POA: Insufficient documentation

## 2020-08-23 DIAGNOSIS — M25512 Pain in left shoulder: Secondary | ICD-10-CM

## 2020-08-23 DIAGNOSIS — Z79891 Long term (current) use of opiate analgesic: Secondary | ICD-10-CM | POA: Diagnosis present

## 2020-08-23 DIAGNOSIS — R800 Isolated proteinuria: Secondary | ICD-10-CM | POA: Diagnosis present

## 2020-08-23 DIAGNOSIS — M329 Systemic lupus erythematosus, unspecified: Secondary | ICD-10-CM | POA: Diagnosis not present

## 2020-08-23 DIAGNOSIS — G8929 Other chronic pain: Secondary | ICD-10-CM | POA: Insufficient documentation

## 2020-08-23 DIAGNOSIS — I10 Essential (primary) hypertension: Secondary | ICD-10-CM | POA: Diagnosis present

## 2020-08-23 DIAGNOSIS — M6281 Muscle weakness (generalized): Secondary | ICD-10-CM

## 2020-08-23 DIAGNOSIS — M5136 Other intervertebral disc degeneration, lumbar region: Secondary | ICD-10-CM

## 2020-08-23 DIAGNOSIS — M5441 Lumbago with sciatica, right side: Secondary | ICD-10-CM | POA: Insufficient documentation

## 2020-08-23 DIAGNOSIS — Z5181 Encounter for therapeutic drug level monitoring: Secondary | ICD-10-CM | POA: Insufficient documentation

## 2020-08-23 DIAGNOSIS — M4120 Other idiopathic scoliosis, site unspecified: Secondary | ICD-10-CM

## 2020-08-23 DIAGNOSIS — M5440 Lumbago with sciatica, unspecified side: Secondary | ICD-10-CM

## 2020-08-23 DIAGNOSIS — M25511 Pain in right shoulder: Secondary | ICD-10-CM | POA: Diagnosis present

## 2020-08-23 DIAGNOSIS — M797 Fibromyalgia: Secondary | ICD-10-CM

## 2020-08-23 MED ORDER — GABAPENTIN 300 MG PO CAPS
300.0000 mg | ORAL_CAPSULE | Freq: Three times a day (TID) | ORAL | 5 refills | Status: DC
Start: 2020-08-23 — End: 2020-09-22

## 2020-08-23 MED ORDER — HYDROCODONE-ACETAMINOPHEN 10-325 MG PO TABS
1.0000 | ORAL_TABLET | Freq: Every day | ORAL | 0 refills | Status: DC | PRN
Start: 2020-08-23 — End: 2020-09-22

## 2020-08-23 MED ORDER — OXYCODONE HCL 5 MG PO TABS: 5.0000 mg | ORAL_TABLET | ORAL | 0 refills | Status: DC | PRN
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
## 2020-08-23 NOTE — Progress Notes (Signed)
Subjective:    Patient ID: [name], female    DOB: 17-Nov-1981, 39 y.o.   MRN: [id number]  HPI  [name] is a 39 year old woman who presents for follow-up of her fibromyalgia, diffuse body aches secondary to lupus, lumbar degenerative joint disease, and scoliosis.   She is currently having a flare of her lupus. Her bones hurt from her chemotherapy. She has had some diarrhea and emesis as well. Zofran helps with the nausea.   If the chemo does not help, she has discussed infusions with her oncologist.   Her pain has worsened as a result of her flare. She does not want to decrease the dose of her hydrocodone at this time (currently taking 10mg  w/ 325mg  Tylenol up to 5 times per day as needed).   She has an appointment with cardiology on June 3rd  Kids started school yesterday. They had a good day. They are both academically gifted.   She is supposed to go to a concert on the 3rd with her husband and is wondering whether she should given the worsening pandemic.    Pain Inventory Average Pain 5 Pain Right Now 2 My pain is sharp, burning, dull, stabbing, tingling and aching  In the last 24 hours, has pain interfered with the following? General activity 5 Relation with others 10 Enjoyment of life 4 What TIME of day is your pain at its worst? morning  and night Sleep (in general) Fair  Pain is worse with: bending, sitting, standing and some activites Pain improves with: rest, heat/ice, therapy/exercise and medication Relief from Meds: 8  Family History  Problem Relation Age of Onset  . Heart disease Mother   . Diabetes Mother   . Hypertension Mother   . High Cholesterol Mother   . Thyroid disease Mother   . Depression Mother   . Obesity Mother   . Heart disease Father   . Diabetes Father   . Hypertension Father   . High Cholesterol Father   . Alcoholism Father   . Bipolar disorder Brother   . Cancer Brother        prostate  . Pancreatic cancer Maternal  Grandmother   . Cancer Maternal Grandmother        pancreatic  . Cancer Maternal Uncle        lung  . Cancer Paternal Aunt   . Bipolar disorder Sister    Social History   Socioeconomic History  . Marital status: Married    Spouse name: Larkin Ina  . Number of children: Not on file  . Years of education: Not on file  . Highest education level: Not on file  Occupational History  . Occupation: stay at home mom  Tobacco Use  . Smoking status: Current Every Day Smoker    Packs/day: 1.00    Years: 11.00    Pack years: 11.00    Types: Cigarettes  . Smokeless tobacco: Never Used  . Tobacco comment: too much stress  Vaping Use  . Vaping Use: Never used  Substance and Sexual Activity  . Alcohol use: No  . Drug use: No  . Sexual activity: Yes    Birth control/protection: Surgical  Other Topics Concern  . Not on file  Social History Narrative   At [employer] wanting to get apply to nursing school.  Plans to apply 2/13.     Social Determinants of Health   Financial Resource Strain:   . Difficulty of Paying Living Expenses: Not on file  Food Insecurity:   . Worried About Charity fundraiser in the Last Year: Not on file  . Ran Out of Food in the Last Year: Not on file  Transportation Needs:   . Lack of Transportation (Medical): Not on file  . Lack of Transportation (Non-Medical): Not on file  Physical Activity:   . Days of Exercise per Week: Not on file  . Minutes of Exercise per Session: Not on file  Stress:   . Feeling of Stress : Not on file  Social Connections:   . Frequency of Communication with Friends and Family: Not on file  . Frequency of Social Gatherings with Friends and Family: Not on file  . Attends Religious Services: Not on file  . Active Member of Clubs or Organizations: Not on file  . Attends Archivist Meetings: Not on file  . Marital Status: Not on file   Past Surgical History:  Procedure Laterality Date  . ABDOMINAL HYSTERECTOMY  06/30/2008    partial  . AXILLARY HIDRADENITIS EXCISION     bilateral  . CESAREAN SECTION  08/31/2006, 08/26/2007  . LAPAROSCOPIC ENDOMETRIOSIS FULGURATION  06/1999  . MASS EXCISION  06/19/2012   Procedure: EXCISION MASS;  Surgeon: Merrie Roof, MD;  Location: [facility];  Service: General;  Laterality: Right;  excision mass right abdominal wall   Past Surgical History:  Procedure Laterality Date  . ABDOMINAL HYSTERECTOMY  06/30/2008   partial  . AXILLARY HIDRADENITIS EXCISION     bilateral  . CESAREAN SECTION  08/31/2006, 08/26/2007  . LAPAROSCOPIC ENDOMETRIOSIS FULGURATION  06/1999  . MASS EXCISION  06/19/2012   Procedure: EXCISION MASS;  Surgeon: Merrie Roof, MD;  Location: [facility];  Service: General;  Laterality: Right;  excision mass right abdominal wall   Past Medical History:  Diagnosis Date  . Abdominal wall mass   . ADHD (attention deficit hyperactivity disorder)   . Anemia   . Arthritis   . Asthma   . Back pain   . Chest pain   . Depression    tried several meds, not on any now  . Diarrhea   . Endometriosis   . Headache(784.0)   . Hydradenitis   . Hydradenitis 07/13/2011  . Hypertension    on inderal  . Leukemia ([place]) at age 74  . Lupus ([place])   . Migraines   . MRSA (methicillin resistant staph aureus) culture positive 2010   ear infection that cultured out MRSA   . Nausea   . S/P total abdominal hysterectomy 06/26/2011  . Scoliosis   . Thyroid disease    BP (!) 127/91   Pulse (!) 111   Temp 98.9 F (37.2 C)   Ht 5' (1.524 m)   Wt 168 lb (76.2 kg)   SpO2 97%   BMI 32.81 kg/m   Opioid Risk Score:   Fall Risk Score:  `1  Depression screen PHQ 2/9  Depression screen [facility] 2/9 05/11/2020 04/18/2020 03/18/2020 05/06/2012  Decreased Interest 3 3 1 1   Down, Depressed, Hopeless 0 3 0 1  PHQ - 2 Score 3 6 1 2   Altered sleeping 3 3 1  -  Tired, decreased energy 3 3 3  -  Change in appetite 0 3 0 -  Feeling bad or failure about yourself  0 3 1 -    Trouble concentrating 3 3 1  -  Moving slowly or fidgety/restless 0 0 0 -  Suicidal thoughts 0 0 0 -  PHQ-9 Score 12 21 7  -  Difficult doing work/chores - Somewhat difficult - -   Review of Systems  Constitutional: Negative.   HENT: Negative.   Eyes: Negative.   Respiratory: Negative.   Cardiovascular: Negative.   Gastrointestinal: Negative.   Endocrine: Negative.   Genitourinary: Negative.   Musculoskeletal: Positive for arthralgias.  Skin: Negative.   Allergic/Immunologic: Negative.   Hematological: Negative.   Psychiatric/Behavioral: Negative.   All other systems reviewed and are negative.      Objective:   Physical Exam Gen: no distress, normal appearing HEENT: oral mucosa pink and moist, NCAT Cardio: Tachycardic Chest: normal effort, normal rate of breathing Abd: soft, non-distended Ext: no edema Skin: malar rash Neuro: Alert and oriented x3.  Musculoskeletal: 5/5 strength throughout. Diffuse tenderness to palpation in muscles and joints of upper and lower extremities.  Psych: pleasant, normal affect       Assessment & Plan:  Mrs. Lover is a 39 year old woman who presents with diffuse joint pain. She has a history of fibromyalgia, Lupus, scoliosis, Sjogren's Disease, and lumbar disc disease.   Fibromyalgia There is presence of widespread pain above and below the waist on both sides for more than 3 months that is consistent with fibromyalgia. There are related symptoms of fatigue and cognitive dysfunction.  Recommended30 minutes of aerobic exercise 5 times per week and resistance exercise of all major muscle groups at least twice per week. Educated patient that this is a crucial component of recovery and that self-responsibility is key.She is already very good about going to [employer] with her daughter 5x per week which is excellent!  Continue phsychology visits which appear to be greatly helping her.   Lumbar disc degeneration: -Appears to be a  component of neuropathic pain in both legs, no evidence of radiculopathy or stenosis on 2016 MRI.Ordered lumbar XR to assess for stenosis since it has been 5 years since last imaging.It showed no acute findings with mild convex rightward lumbar scoliosis- discussed with patient. -Prescribed physical therapy for core and paraspinal stretching and strengthening.She has been attending these appointments with benefit. I have reviewed notes.  -Pain contract and UDS signedfirst visit. -Refilled Norco today 10mg  with 325mg  Tylenol up to 5 times per day as needed. Has been helping to control her pain and she has been taking as prescribed. PDMP reviewed and patient is not receiving this medication from any other providers.   Quality of Life: -Continue to encourage return to work, which is one of patient's goals.She enjoyed her time at beach with her family. She is proud of her children's success at their school. She plans to go to a concert with her husband and asks whether she should reschedule due to [place]. She has received both vaccines. I advised that this does decrease her chances of acquiring the Delta variant, though not completely.   Hypotension: -Decreased amlodipine to 5mg  last visit. Provided script. Log pressures and bring to f/u appointment. Amlodipine may also be contributing to leg swelling. Hopefully with more exercise and better nutrition we can get her off this medication. Current BP is 824/23- systolic is well controlled but diastolic is slightly elevated.   Vitamin D deficiency: completed high dose treatment, Advised that deficiency can also contribute to pain.  Bilateral shoulder pain: Discussed results of shoulder XRs with patient- both were stable.   Leg swelling: Advised ice, compression garments, elevation. Continue spironolactone. Prescribed Lasix needed. Bmp obtained to check creatinine. Result was normal: discussed with patient.  Obesity: Advised low carb diet.  Patient eager to follow and will involve her husband who is very supportive.   All questions answered. RTC in 1 month.

## 2020-08-25 ENCOUNTER — Ambulatory Visit: Payer: Medicaid Other | Admitting: Physical Therapy

## 2020-08-29 ENCOUNTER — Encounter (INDEPENDENT_AMBULATORY_CARE_PROVIDER_SITE_OTHER): Payer: Self-pay | Admitting: Bariatrics

## 2020-08-29 ENCOUNTER — Other Ambulatory Visit: Payer: Self-pay

## 2020-08-29 ENCOUNTER — Ambulatory Visit (INDEPENDENT_AMBULATORY_CARE_PROVIDER_SITE_OTHER): Payer: Medicaid Other | Admitting: Bariatrics

## 2020-08-29 VITALS — BP 123/84 | HR 92 | Temp 98.6°F | Ht 60.0 in | Wt 166.0 lb

## 2020-08-29 DIAGNOSIS — I10 Essential (primary) hypertension: Secondary | ICD-10-CM

## 2020-08-29 DIAGNOSIS — E669 Obesity, unspecified: Secondary | ICD-10-CM

## 2020-08-29 DIAGNOSIS — Z6832 Body mass index (BMI) 32.0-32.9, adult: Secondary | ICD-10-CM

## 2020-08-29 DIAGNOSIS — E8881 Metabolic syndrome: Secondary | ICD-10-CM

## 2020-08-29 NOTE — Progress Notes (Signed)
Chief Complaint:   OBESITY [name] is here to discuss her progress with her obesity treatment plan along with follow-up of her obesity related diagnoses. [name] is on the Category 1 Plan and states she is following her eating plan approximately 25% of the time. [name] states she is exercising 0 minutes 0 times per week.  Today's visit was #: 7 Starting weight: 166 lbs Starting date: 04/18/2020 Today's weight: 166 lbs Today's date: 08/29/2020 Total lbs lost to date: 0 Total lbs lost since last in-office visit: 0  Interim History: [name] is up 2 lbs. She states she was told by Rheumatology that her lupus is worse than ever. Her water weight is up about 4 lbs per the bioimpedance scale.  Subjective:   Insulin resistance. [name] has a diagnosis of insulin resistance based on her elevated fasting insulin level >5. She continues to work on diet and exercise to decrease her risk of diabetes. [name] is taking metformin. Levels are stable. She does report a decreased appetite.  Lab Results  Component Value Date   INSULIN 16.6 07/28/2020   INSULIN 8.4 04/18/2020   Lab Results  Component Value Date   HGBA1C 5.2 07/28/2020   Essential hypertension. Blood pressure is controlled. Catilyn denies lightheadedness.  BP Readings from Last 3 Encounters:  08/29/20 123/84  08/23/20 (!) 127/91  07/28/20 (!) 129/88   Lab Results  Component Value Date   CREATININE 0.64 07/28/2020   CREATININE 0.77 07/12/2020   CREATININE 0.63 01/25/2020   Assessment/Plan:   Insulin resistance. [name] will continue to work on weight loss, exercise, decreasing sweets, and decreasing simple carbohydrates to help decrease the risk of diabetes. [name] agreed to follow-up with [place] as directed to closely monitor her progress. She will continue metformin as directed. Handout was provided on Insulin Resistance and Prediabetes.  Essential hypertension. [name] is working on healthy weight loss and exercise to  improve blood pressure control. We will watch for signs of hypotension as she continues her lifestyle modifications. She will continue her medication as directed.   Class 1 obesity with serious comorbidity and body mass index (BMI) of 32.0 to 32.9 in adult, unspecified obesity type.  [name] is currently in the action stage of change. As such, her goal is to continue with weight loss efforts. She has agreed to the Category 1 Plan.   She will work on meal planning and adhering to the meal plan more closely.  We reviewed with the patient labs including CMP, Vitamin D, A1c, and insulin.  Exercise goals: All adults should avoid inactivity. Some physical activity is better than none, and adults who participate in any amount of physical activity gain some health benefits.  Behavioral modification strategies: increasing lean protein intake, decreasing simple carbohydrates, increasing vegetables, increasing water intake, decreasing eating out, no skipping meals, meal planning and cooking strategies, keeping healthy foods in the home and planning for success.  [name] has agreed to follow-up with our clinic in 2-3 weeks. She was informed of the importance of frequent follow-up visits to maximize her success with intensive lifestyle modifications for her multiple health conditions.   Objective:   Blood pressure 123/84, pulse 92, temperature 98.6 F (37 C), height 5' (1.524 m), weight 166 lb (75.3 kg), SpO2 97 %. Body mass index is 32.42 kg/m.  General: Cooperative, alert, well developed, in no acute distress. HEENT: Conjunctivae and lids unremarkable. Cardiovascular: Regular rhythm.  Lungs: Normal work of breathing. Neurologic: No focal deficits.   Lab Results  Component Value Date   CREATININE 0.64 07/28/2020   BUN 8 07/28/2020   NA 141 07/28/2020   K 4.5 07/28/2020   CL 104 07/28/2020   CO2 22 07/28/2020   Lab Results  Component Value Date   ALT 14 07/28/2020   AST 12 07/28/2020    ALKPHOS 85 07/28/2020   BILITOT 0.6 07/28/2020   Lab Results  Component Value Date   HGBA1C 5.2 07/28/2020   HGBA1C 5.3 01/25/2020   Lab Results  Component Value Date   INSULIN 16.6 07/28/2020   INSULIN 8.4 04/18/2020   Lab Results  Component Value Date   TSH 2.790 04/18/2020   Lab Results  Component Value Date   CHOL 161 07/28/2020   HDL 31 (L) 07/28/2020   LDLCALC 107 (H) 07/28/2020   TRIG 128 07/28/2020   CHOLHDL 4.3 01/25/2020   Lab Results  Component Value Date   WBC 9.1 01/25/2020   HGB 14.8 01/25/2020   HCT 44.6 01/25/2020   MCV 91 01/25/2020   PLT 262 01/25/2020   No results found for: IRON, TIBC, FERRITIN  Attestation Statements:   Reviewed by clinician on day of visit: allergies, medications, problem list, medical history, surgical history, family history, social history, and previous encounter notes.  Time spent on visit including pre-visit chart review and post-visit charting and care was 20 minutes.   Migdalia Dk, am acting as Location manager for [employer], DO   I have reviewed the above documentation for accuracy and completeness, and I agree with the above. Jearld Lesch, DO

## 2020-08-30 ENCOUNTER — Encounter (INDEPENDENT_AMBULATORY_CARE_PROVIDER_SITE_OTHER): Payer: Self-pay | Admitting: Bariatrics

## 2020-08-30 ENCOUNTER — Encounter: Payer: Medicaid Other | Admitting: Physical Therapy

## 2020-08-30 DIAGNOSIS — L732 Hidradenitis suppurativa: Principal | ICD-10-CM

## 2020-08-30 MED ORDER — RIFAMPIN 300 MG CAPSULE
ORAL_CAPSULE | Freq: Two times a day (BID) | ORAL | 0 refills | 30.00000 days | Status: CP
Start: 2020-08-30 — End: 2020-09-29

## 2020-08-30 MED ORDER — CLINDAMYCIN HCL 300 MG CAPSULE
ORAL_CAPSULE | Freq: Two times a day (BID) | ORAL | 0 refills | 30.00000 days | Status: CP
Start: 2020-08-30 — End: 2020-09-29

## 2020-09-02 ENCOUNTER — Ambulatory Visit: Admit: 2020-09-02 | Discharge: 2020-09-03 | Payer: MEDICAID

## 2020-09-02 MED ORDER — LISINOPRIL 20 MG TABLET
ORAL_TABLET | Freq: Every day | ORAL | 6 refills | 30.00000 days | Status: CP
Start: 2020-09-02 — End: 2020-10-02

## 2020-09-02 NOTE — Unmapped (Addendum)
Chest pain and shortness of breath - will obtain a stress test and echocardiogram.  HTN - will add

## 2020-09-02 NOTE — Unmapped (Signed)
DIVISION OF CARDIOLOGY  [facility] [place]        Date of Service: 09/02/2020      PCP: Referring Provider:   Laney Potash. Earlene Plater, DO  [address]  [place] [address]  Phone: [phone]  Fax: [phone] Ethlyn Gallery, MD  [address]  [place],  [place] [address]  Phone: [phone]  Fax: [phone]     ______________________________________________________________________________________________      ASSESSMENT AND PLAN:       1. Anginal pain (CMS-HCC)  - will obtain an exercise stress test and echocardiogram  - her EKG is unremarkable.  - she has typical/atypical symptoms.    - we discussed smoking cessation.    - she is on Vyvanse / stimulant and that might be contributing to her symptoms.    2.  HTN - will add lisinopril to her regimen.  3.  HL -   4.  DM - on metformin.    Jacquelyne Balint, MD,  [facility], [facility]  Interventional Cardiology  Associate Professor of Medicine  [place] [facility]    ______________________________________________________________________________________________      SUBJECTIVE:     HISTORY OF PRESENT ILLNESS:    Dear Dr. 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      She has been having chest pain with exertion, somewhat atypical symptoms - but some of them are with exertion.  It lasts for about 5 minutes.  She smokes about a pack per day, has HTN, DM and HL.  Her mother had an MI at the age of 25 and her father recently had CABG.  She is concerned that she might have CAD.      She reports no lightheadedness, dizziness, palpitations, or syncope.  Patient also denies any overt heart failure symptoms such as orthopnea, paroxysmal nocturnal dyspnea, or worsening lower extremity edema. Hollee M Panepinto states compliance with her medications and denies any untoward side effects from it.          CARDIOVASCULAR HISTORY AND PROCEDURES    Cardiovascular Studies Date Comments     ECG 2021 NSR   Echo 2021    Stress test 2021    Cardiac catheterization 2021    Electrophysiology      Cardiovascular Surgery     Peripheral Vascular Studies         I have reviewed the cardiology tests personally.      PAST MEDICAL HISTORY  Past Medical History:   Diagnosis Date   ??? ADHD (attention deficit hyperactivity disorder)    ??? Allergic 2007    Allergic to shrimp   ??? Anemia    ??? Asthma    ??? Chronic pain    ??? Disorder of skin or subcutaneous tissue 2001    Hidradenitis suprative   ??? Fibromyalgia    ??? Hidradenitis suppurativa    ??? Hypertension    ??? Leukemia (CMS-HCC)    ??? Lung disease 2017    COPD   ??? Lupus (systemic lupus erythematosus) (CMS-HCC)    ??? Migraines    ??? Osteoarthritis    ??? Scoliosis        ALLERGIES  Other, Shellfish containing products, Sulfasalazine, Toradol [ketorolac],  Venom-honey bee, Opioids - morphine analogues, Tramadol, Hydromorphone hcl, and Naproxen      CURRENT MEDICATIONS  Current Outpatient Medications   Medication Sig Dispense Refill   ??? acyclovir (ZOVIRAX) 400 MG tablet Take 1 tablet by mouth by mouth 2 times daily for prophylaxis  6   ??? albuterol (PROVENTIL HFA;VENTOLIN HFA) 90 mcg/actuation inhaler Inhale 2 puffs daily as needed.      ??? amLODIPine (NORVASC) 5 MG tablet Take 10 mg by mouth daily.      ??? aspirin-acetaminophen-caffeine (EXCEDRIN MIGRAINE) 250-250-65 mg per tablet Take 2 tablets by mouth.     ??? BD TUBERCULIN SYRINGE 1 mL 25 gauge x 5/8 Syrg Use 1 syringe every 7 days with methotrexate 50 each 1   ??? beclomethasone (QVAR) 40 mcg/actuation inhaler Inhale 2 puffs daily as needed.      ??? clindamycin (CLEOCIN) 300 MG capsule Take 1 capsule (300 mg total) by mouth Two (2) times a day. 60 capsule 0   ??? cloNIDine HCl (CATAPRES) 0.1 MG tablet Take 0.1 mg by mouth Three (3) times a day.      ??? dexlansoprazole (DEXILANT) 60 mg capsule Take 60 mg by mouth.     ??? EPINEPHrine (EPIPEN) 0.3 mg/0.3 mL injection Inject 0.3 mg into the muscle.     ??? ergocalciferol-1,250 mcg, 50,000 unit, (DRISDOL) 1,250 mcg (50,000 unit) capsule Take 50,000 Units by mouth.     ??? escitalopram oxalate (LEXAPRO) 10 MG tablet Take 10 mg by mouth daily.  3   ??? fluocinonide (LIDEX) 0.05 % cream Apply 1 application topically Two (2) times a day. To rash on face. Use for up to 2 weeks at a time. Practice sun protection. 30 g 1   ??? fluticasone propionate (FLOVENT HFA) 220 mcg/actuation inhaler 2 puffs.     ??? gabapentin (NEURONTIN) 300 MG capsule Take 300 mg by mouth Three (3) times a day.     ??? HUMIRA PEN CITRATE FREE 40 MG/0.4 ML Inject the contents of 1 pen (40 mg total) under the skin every seven (7) days. 4 each 4   ??? hydroCHLOROthiazide (HYDRODIURIL) 25 MG tablet Take 200 mg by mouth.      ??? HYDROcodone-acetaminophen (NORCO 10-325) 10-325 mg per tablet Take 1 tablet by mouth 5 times daily as needed for pain     ??? hydrocortisone 2.5 % cream 1-2 times a day on the red areas on the face for 1 week straight maximum. 30 g 1   ??? hydrOXYchloroQUINE (PLAQUENIL) 200 mg tablet Take 2 tablets (400 mg total) by mouth daily. 180 tablet 3   ??? HYSINGLA ER 20 mg TP24 Take 1 tablet by mouth every 24 hours     ??? ipratropium (ATROVENT) 0.03 % nasal spray 2 sprays into each nostril.     ??? leucovorin (WELLCOVORIN) 5 mg tablet Take 3 tablets (15 mg total) by mouth daily. Take 15 mg once a week 18-24 hours after the methotrexate. 90 tablet 3   ??? lidocaine (LIDODERM) 5 % patch Apply 1 patch to skin as directed 12 hours on, 12 hours off     ??? metFORMIN (GLUCOPHAGE) 500 MG tablet TAKE 1 TABLET BY MOUTH 2 TIMES DAILY WITH MEALS 180 tablet 3   ??? methotrexate, Preservative Free, 25 mg/mL Soln vial/syringe inject 0.61mls under the skin once a week 12 mL 1   ??? MITIGARE 0.6 mg cap capsule Take 0.6 mg by mouth daily.     ??? montelukast (SINGULAIR) 10 mg tablet Take 10  mg by mouth nightly.     ??? NARCAN 4 mg/actuation nasal spray spray (4 mg) in 1 nostril may repeat dose every 2-3 minutes as needed alternating nostrils with each dose     ??? promethazine (PHENERGAN) 25 MG tablet Take 1 tablet (25 mg total) by mouth every six (6) hours as needed for nausea. 30 tablet 3   ??? propranolol (INDERAL) 40 MG tablet Take 40 mg by mouth Two (2) times a day.      ??? rifAMPin (RIFADIN) 300 MG capsule Take 1 capsule (300 mg total) by mouth Two (2) times a day. 60 capsule 0   ??? spironolactone (ALDACTONE) 25 MG tablet Take 25 mg by mouth.     ??? topiramate (TOPAMAX) 50 MG tablet TAKE 1 TABLET BY MOUTH 2 TIMES DAILY  5   ??? traZODone (DESYREL) 50 MG tablet Take 50 mg by mouth.     ??? VYVANSE 30 mg capsule Take 30 mg by mouth two (2) times a day.   0     No current facility-administered medications for this visit.       FAMILY HISTORY  Negative for early CAD    SOCIAL HISTORY  She  reports that she has been smoking cigarettes. She has a 15.00 pack-year smoking history. She has never used smokeless tobacco. She reports that she does not drink alcohol and does not use drugs.      REVIEW OF SYSTEMS    Review of Systems - 10 systems were reviewed and negative except as noted in HPI    Constitutional: negative for - chills, fatigue, fever or night sweats  ENT ROS: negative for - vertigo or visual changes  Hematological and Lymphatic ROS: negative for - blood transfusions, jaundice, night sweats or swollen lymph nodes  Endocrine ROS: negative for - skin changes or temperature intolerance  Respiratory ROS: no cough, shortness of breath, or wheezing negative for - hemoptysis, orthopnea, shortness of breath, tachypnea or wheezing  Cardiovascular ROS:  As reported in HPI  Gastrointestinal ROS: negative for - abdominal pain, hematemesis, melena, nausea/vomiting or swallowing difficulty/pain  Genito-Urinary ROS: negative for - dysuria, incontinence or nocturia  Musculoskeletal ROS: negative for - gait disturbance, joint pain, muscle pain or muscular weakness  Neurological ROS: negative for - behavioral changes, bowel and bladder control changes, headaches, seizures or speech problems    PHYSICAL EXAM     Physical Exam  There were no vitals taken for this visit.   Wt Readings from Last 3 Encounters:   02/15/20 76.7 kg (169 lb)   07/06/19 75.8 kg (167 lb)   03/05/19 76.2 kg (167 lb 14.4 oz)       General:  Alert, no distress.   Eyes:  Intact, sclerae anicteric.   Ears, nose, mouth: Moist mucous membranes.Supple, no carotid bruit.    Respiratory:   CTAB bilaterally with normal WOB.   Cardiovascular:  No carotid bruit, no JVD, RRR no murmurs rubs or gallops   Gastrointestinal:   Normal bowel sounds, soft, NTND.   Musculoskeletal: Normal strength   Skin: Warm, well perfused.   Neurologic: No focal deficits.       Most recent labs   Lab Results   Component Value Date    Sodium 136 01/14/2019    Potassium 4.4 01/14/2019    Chloride 101 01/14/2019    CO2 24.0 01/14/2019    BUN 13 01/14/2019    Creatinine 0.60 06/27/2020     Lab Results   Component Value  Date    HGB 13.9 06/27/2020    MCV 90.9 06/27/2020    Platelet 243 06/27/2020     Lab Results   Component Value Date    Cholesterol 179 03/29/2017    Triglycerides 115 03/29/2017    HDL 36 (L) 03/29/2017    Non-HDL Cholesterol 143 03/29/2017    LDL Calculated 120 (H) 03/29/2017    TSH 1.600 10/30/2016    PRO-BNP 90.3 04/27/2019       Lab Results   Component Value Date    CHOL 179 03/29/2017     Lab Results   Component Value Date    HDL 36 (L) 03/29/2017     Lab Results   Component Value Date    LDL 120 (H) 03/29/2017     Lab Results   Component Value Date    VLDL 23 03/29/2017     Lab Results   Component Value Date    CHOLHDLRATIO 5.0 (H) 03/29/2017     Lab Results   Component Value Date    TRIG 115 03/29/2017         *Patient note was created using dragon dictation. Any errors in syntax or proofreading may not have been identified and edited on initial review prior to signing this note.    Thank you very much for allowing me the opportunity to participate in the care of Kim Charles, who is a delightful patient.  Please do not hesitate to call me if you have any questions.      Sincerely,    Jacquelyne Balint, MD, [facility], [facility]  Interventional Cardiology  Associate Professor of Medicine  [place] [facility] [place] at [facility]

## 2020-09-02 NOTE — Unmapped (Signed)
Kim Charles has been having a flare and started Clindamycin/Rifampin to help.  She has not picked up the medicine at the pharmacy.  She also was not sure if she is to continue the Humira while on the antibiotic.  Dr. Rosalee Kaufman confirmed she does not need to stop her Humira.  This dose will be 5 days late because she thought she was to hold.  I will set up another clinical next month to check in.      [facility] Clinical Assessment & Refill Coordination Note    Kim Charles, DOB: July 29, 1981  Phone: [phone] (home)     All above HIPAA information was verified with patient.     Was a Nurse, learning disability used for this call? No    Specialty Medication(s):   Inflammatory Disorders: Humira     Current Outpatient Medications   Medication Sig Dispense Refill   ??? acyclovir (ZOVIRAX) 400 MG tablet Take 1 tablet by mouth by mouth 2 times daily for prophylaxis  6   ??? albuterol (PROVENTIL HFA;VENTOLIN HFA) 90 mcg/actuation inhaler Inhale 2 puffs daily as needed.      ??? amLODIPine (NORVASC) 5 MG tablet Take 10 mg by mouth daily.      ??? aspirin-acetaminophen-caffeine (EXCEDRIN MIGRAINE) 250-250-65 mg per tablet Take 2 tablets by mouth.     ??? BD TUBERCULIN SYRINGE 1 mL 25 gauge x 5/8 Syrg Use 1 syringe every 7 days with methotrexate 50 each 1   ??? beclomethasone (QVAR) 40 mcg/actuation inhaler Inhale 2 puffs daily as needed.      ??? clindamycin (CLEOCIN) 300 MG capsule Take 1 capsule (300 mg total) by mouth Two (2) times a day. (Patient not taking: Reported on 09/02/2020) 60 capsule 0   ??? cloNIDine HCl (CATAPRES) 0.1 MG tablet Take 0.1 mg by mouth Three (3) times a day.      ??? dexlansoprazole (DEXILANT) 60 mg capsule Take 60 mg by mouth.     ??? EPINEPHrine (EPIPEN) 0.3 mg/0.3 mL injection Inject 0.3 mg into the muscle. (Patient not taking: Reported on 09/02/2020)     ??? ergocalciferol-1,250 mcg, 50,000 unit, (DRISDOL) 1,250 mcg (50,000 unit) capsule Take 50,000 Units by mouth.     ??? escitalopram oxalate (LEXAPRO) 10 MG tablet Take 10 mg by mouth daily.  3   ??? fluocinonide (LIDEX) 0.05 % cream Apply 1 application topically Two (2) times a day. To rash on face. Use for up to 2 weeks at a time. Practice sun protection. 30 g 1   ??? fluticasone propionate (FLOVENT HFA) 220 mcg/actuation inhaler 2 puffs.     ??? gabapentin (NEURONTIN) 300 MG capsule Take 300 mg by mouth Three (3) times a day.     ??? HUMIRA PEN CITRATE FREE 40 MG/0.4 ML Inject the contents of 1 pen (40 mg total) under the skin every seven (7) days. 4 each 4   ??? hydroCHLOROthiazide (HYDRODIURIL) 25 MG tablet Take 200 mg by mouth.      ??? HYDROcodone-acetaminophen (NORCO 10-325) 10-325 mg per tablet Take 1 tablet by mouth 5 times daily as needed for pain     ??? hydrocortisone 2.5 % cream 1-2 times a day on the red areas on the face for 1 week straight maximum. 30 g 1   ??? hydrOXYchloroQUINE (PLAQUENIL) 200 mg tablet Take 2 tablets (400 mg total) by mouth daily. 180 tablet 3   ??? HYSINGLA ER 20 mg TP24 Take 1 tablet by mouth every 24 hours     ???  ipratropium (ATROVENT) 0.03 % nasal spray 2 sprays into each nostril.     ??? leucovorin (WELLCOVORIN) 5 mg tablet Take 3 tablets (15 mg total) by mouth daily. Take 15 mg once a week 18-24 hours after the methotrexate. 90 tablet 3   ??? lidocaine (LIDODERM) 5 % patch Apply 1 patch to skin as directed 12 hours on, 12 hours off     ??? lisinopriL (PRINIVIL,ZESTRIL) 20 MG tablet Take 1 tablet (20 mg total) by mouth daily. 30 tablet 6   ??? metFORMIN (GLUCOPHAGE) 500 MG tablet TAKE 1 TABLET BY MOUTH 2 TIMES DAILY WITH MEALS 180 tablet 3   ??? methotrexate, Preservative Free, 25 mg/mL Soln vial/syringe inject 0.48mls under the skin once a week 12 mL 1   ??? MITIGARE 0.6 mg cap capsule Take 0.6 mg by mouth daily.     ??? montelukast (SINGULAIR) 10 mg tablet Take 10 mg by mouth nightly.     ??? NARCAN 4 mg/actuation nasal spray spray (4 mg) in 1 nostril may repeat dose every 2-3 minutes as needed alternating nostrils with each dose     ??? promethazine (PHENERGAN) 25 MG tablet Take 1 tablet (25 mg total) by mouth every six (6) hours as needed for nausea. 30 tablet 3   ??? propranolol (INDERAL) 40 MG tablet Take 40 mg by mouth Two (2) times a day.      ??? rifAMPin (RIFADIN) 300 MG capsule Take 1 capsule (300 mg total) by mouth Two (2) times a day. 60 capsule 0   ??? spironolactone (ALDACTONE) 25 MG tablet Take 25 mg by mouth.     ??? topiramate (TOPAMAX) 50 MG tablet TAKE 1 TABLET BY MOUTH 2 TIMES DAILY  5   ??? traZODone (DESYREL) 50 MG tablet Take 50 mg by mouth.     ??? VYVANSE 30 mg capsule Take 30 mg by mouth two (2) times a day.   0     No current facility-administered medications for this visit.        Changes to medications: Atasha reports starting the following medications: Clindamycin and Rifampin    Allergies   Allergen Reactions   ??? Other Hives, Itching and Other (See Comments)     High fever. Hives and itching all over the body.   ??? Shellfish Containing Products Anaphylaxis   ??? Sulfasalazine Hives   ??? Toradol [Ketorolac] Hives   ??? Venom-Honey Bee Anaphylaxis   ??? Opioids - Morphine Analogues      Other reaction(s): Other  Other reaction(s): Other (See Comments)  Causes migraine  Headache   ??? Tramadol Nausea And Vomiting   ??? Hydromorphone Hcl      migraine   ??? Naproxen      Grits teeth       Changes to allergies: No    SPECIALTY MEDICATION ADHERENCE     Humira 40/0.4 mg/ml: 0 days of medicine on hand       Medication Adherence    Patient reported X missed doses in the last month: 0  Specialty Medication: Humira   Patient is on additional specialty medications: No  Informant: patient  Confirmed plan for next specialty medication refill: delivery by pharmacy  Refills needed for supportive medications: not needed          Specialty medication(s) dose(s) confirmed: Regimen is correct and unchanged.     Are there any concerns with adherence? No    Adherence counseling provided? Not needed    CLINICAL MANAGEMENT AND INTERVENTION  Clinical Benefit Assessment:    Do you feel the medicine is effective or helping your condition? No    Clinical Benefit counseling provided? consulted provider regarding clinical benefit concerns    Adverse Effects Assessment:    Are you experiencing any side effects? No    Are you experiencing difficulty administering your medicine? No    Quality of Life Assessment:    How many days over the past month did your HS  keep you from your normal activities? For example, brushing your teeth or getting up in the morning. 0    Have you discussed this with your provider? Not needed    Therapy Appropriateness:    Is therapy appropriate? Yes, therapy is appropriate and should be continued    DISEASE/MEDICATION-SPECIFIC INFORMATION      For patients on injectable medications: Patient currently has 0 doses left.  Next injection is scheduled for 09/03/20.    PATIENT SPECIFIC NEEDS     - Does the patient have any physical, cognitive, or cultural barriers? No    - Is the patient high risk? No    - Does the patient require a Care Management Plan? No     - Does the patient require physician intervention or other additional services (i.e. nutrition, smoking cessation, social work)? No      SHIPPING     Specialty Medication(s) to be Shipped:   Inflammatory Disorders: Humira    Other medication(s) to be shipped: No additional medications requested for fill at this time     Changes to insurance: No    Delivery Scheduled: Yes, Expected medication delivery date: 09/08/20.     Medication will be delivered via UPS to the confirmed prescription address in [facility].    The patient will receive a drug information handout for each medication shipped and additional FDA Medication Guides as required.  Verified that patient has previously received a Conservation officer, historic buildings.    All of the patient's questions and concerns have been addressed.    Debroh Sieloff Vangie Bicker   [facility] Specialty Pharmacist

## 2020-09-07 DIAGNOSIS — M359 Systemic involvement of connective tissue, unspecified: Principal | ICD-10-CM

## 2020-09-07 MED ORDER — METHOTREXATE SODIUM (PF) 25 MG/ML INJECTION SOLUTION
1 refills | 0.00000 days
Start: 2020-09-07 — End: ?

## 2020-09-07 MED FILL — HUMIRA PEN CITRATE FREE 40 MG/0.4 ML: SUBCUTANEOUS | 28 days supply | Qty: 4 | Fill #3

## 2020-09-07 MED FILL — HUMIRA PEN CITRATE FREE 40 MG/0.4 ML: 28 days supply | Qty: 4 | Fill #3 | Status: AC

## 2020-09-08 MED ORDER — METHOTREXATE SODIUM (PF) 25 MG/ML INJECTION SOLUTION
1 refills | 0.00000 days | Status: CP
Start: 2020-09-08 — End: ?

## 2020-09-08 NOTE — Unmapped (Signed)
Methotrexate refill  Last ov: \06/23/2020   Next ov: 10/27/2020     Labs:   AST   Date Value Ref Range Status   06/27/2020 16 <34 U/L Final     ALT   Date Value Ref Range Status   06/27/2020 11 10 - 49 U/L Final     Creatinine   Date Value Ref Range Status   06/27/2020 0.60 0.50 - 0.80 mg/dL Final     WBC   Date Value Ref Range Status   06/27/2020 10.6 (H) 3.5 - 10.5 10*9/L Final     HGB   Date Value Ref Range Status   06/27/2020 13.9 12.0 - 15.5 g/dL Final     HCT   Date Value Ref Range Status   06/27/2020 41.7 35.0 - 44.0 % Final     MCV   Date Value Ref Range Status   06/27/2020 90.9 82.0 - 98.0 fL Final     RDW   Date Value Ref Range Status   06/27/2020 14.1 12.0 - 15.0 % Final     Platelet   Date Value Ref Range Status   06/27/2020 243 150 - 450 10*9/L Final     Neutrophils %   Date Value Ref Range Status   06/27/2020 52.5 % Final     Lymphocytes %   Date Value Ref Range Status   06/27/2020 39.6 % Final     Monocytes %   Date Value Ref Range Status   06/27/2020 5.3 % Final     Eosinophils %   Date Value Ref Range Status   06/27/2020 1.4 % Final     Basophils %   Date Value Ref Range Status   06/27/2020 1.2 % Final

## 2020-09-13 ENCOUNTER — Ambulatory Visit (INDEPENDENT_AMBULATORY_CARE_PROVIDER_SITE_OTHER): Payer: Medicaid Other | Admitting: Family Medicine

## 2020-09-13 ENCOUNTER — Other Ambulatory Visit: Payer: Self-pay

## 2020-09-13 VITALS — BP 149/105 | HR 98 | Temp 97.5°F | Resp 17 | Wt 164.0 lb

## 2020-09-13 DIAGNOSIS — I10 Essential (primary) hypertension: Secondary | ICD-10-CM

## 2020-09-13 DIAGNOSIS — J302 Other seasonal allergic rhinitis: Secondary | ICD-10-CM | POA: Diagnosis not present

## 2020-09-13 DIAGNOSIS — Z23 Encounter for immunization: Secondary | ICD-10-CM | POA: Diagnosis not present

## 2020-09-13 DIAGNOSIS — K219 Gastro-esophageal reflux disease without esophagitis: Secondary | ICD-10-CM

## 2020-09-13 MED ORDER — LISINOPRIL 20 MG PO TABS: 20.0000 mg | ORAL_TABLET | Freq: Every day | ORAL | 1 refills | Status: AC

## 2020-09-13 MED ORDER — FUROSEMIDE 20 MG PO TABS: 20.0000 mg | ORAL_TABLET | Freq: Three times a day (TID) | ORAL | 1 refills | Status: AC | PRN

## 2020-09-13 MED ORDER — PROPRANOLOL HCL 40 MG PO TABS: 40.0000 mg | ORAL_TABLET | Freq: Two times a day (BID) | ORAL | 1 refills | Status: AC

## 2020-09-13 MED ORDER — IPRATROPIUM BROMIDE 0.06 % NA SOLN: 2.0000 | Freq: Four times a day (QID) | NASAL | 0 refills | Status: AC

## 2020-09-13 MED ORDER — DEXILANT 60 MG PO CPDR: 60.0000 mg | DELAYED_RELEASE_CAPSULE | Freq: Every day | ORAL | 1 refills | Status: AC

## 2020-09-13 MED ORDER — AMLODIPINE BESYLATE 5 MG PO TABS
5.0000 mg | ORAL_TABLET | Freq: Every day | ORAL | 1 refills | Status: DC
Start: 2020-09-13 — End: 2020-12-01

## 2020-09-13 MED ORDER — CETIRIZINE HCL 10 MG PO TABS: 10.0000 mg | ORAL_TABLET | Freq: Every day | ORAL | 3 refills | Status: AC

## 2020-09-13 MED ORDER — SPIRONOLACTONE 25 MG PO TABS: 25.0000 mg | ORAL_TABLET | Freq: Every day | ORAL | 1 refills | Status: AC

## 2020-09-13 NOTE — Progress Notes (Signed)
Established Patient Office Visit  Subjective:  Patient ID: [name], female    DOB: 1981-06-20  Age: 39 y.o. MRN: [id number]  CC:  Chief Complaint  Patient presents with  . Hypertension    HPI [name] presents for hypertension, GERD, follow-up and flu vaccine. Checks BP at home. BP readings at home have elevated similar to reading today. Recent started on Mexotrexate by rheumatologist. She has had intermittent chest pain and has a follow-up scheduled with [facility] cardiology Friday for stress and echocardiogram. She is a smoker. Previously prescribed Chantix, unable to quick. Followed by weight management, advised to discontinue sodas. Current BMI Body mass index is 32.03 kg/m. last A1C normal 5.2.  GERD active, needs refills on medication. Making more efforts to cook at home to reduce intake of high fat and sodium. Interested in Emison booster given she is immunocompromised. She is also considering returning to work part time.  S  Past Medical History:  Diagnosis Date  . Abdominal wall mass   . ADHD (attention deficit hyperactivity disorder)   . Anemia   . Arthritis   . Asthma   . Back pain   . Chest pain   . Depression    tried several meds, not on any now  . Diarrhea   . Endometriosis   . Headache(784.0)   . Hydradenitis   . Hydradenitis 07/13/2011  . Hypertension    on inderal  . Leukemia ([place]) at age 82  . Lupus ([place])   . Migraines   . MRSA (methicillin resistant staph aureus) culture positive 2010   ear infection that cultured out MRSA   . Nausea   . S/P total abdominal hysterectomy 06/26/2011  . Scoliosis   . Thyroid disease     Past Surgical History:  Procedure Laterality Date  . ABDOMINAL HYSTERECTOMY  06/30/2008   partial  . AXILLARY HIDRADENITIS EXCISION     bilateral  . CESAREAN SECTION  08/31/2006, 08/26/2007  . LAPAROSCOPIC ENDOMETRIOSIS FULGURATION  06/1999  . MASS EXCISION  06/19/2012   Procedure: EXCISION MASS;  Surgeon: Merrie Roof, MD;   Location: [facility];  Service: General;  Laterality: Right;  excision mass right abdominal wall    Family History  Problem Relation Age of Onset  . Heart disease Mother   . Diabetes Mother   . Hypertension Mother   . High Cholesterol Mother   . Thyroid disease Mother   . Depression Mother   . Obesity Mother   . Heart disease Father   . Diabetes Father   . Hypertension Father   . High Cholesterol Father   . Alcoholism Father   . Bipolar disorder Brother   . Cancer Brother        prostate  . Pancreatic cancer Maternal Grandmother   . Cancer Maternal Grandmother        pancreatic  . Cancer Maternal Uncle        lung  . Cancer Paternal Aunt   . Bipolar disorder Sister     Social History   Socioeconomic History  . Marital status: Married    Spouse name: Larkin Ina  . Number of children: Not on file  . Years of education: Not on file  . Highest education level: Not on file  Occupational History  . Occupation: stay at home mom  Tobacco Use  . Smoking status: Current Every Day Smoker    Packs/day: 1.00    Years: 11.00    Pack years:  11.00    Types: Cigarettes  . Smokeless tobacco: Never Used  . Tobacco comment: too much stress  Vaping Use  . Vaping Use: Never used  Substance and Sexual Activity  . Alcohol use: No  . Drug use: No  . Sexual activity: Yes    Birth control/protection: Surgical  Other Topics Concern  . Not on file  Social History Narrative   At [employer] wanting to get apply to nursing school.  Plans to apply 2/13.     Social Determinants of Health   Financial Resource Strain:   . Difficulty of Paying Living Expenses: Not on file  Food Insecurity:   . Worried About Charity fundraiser in the Last Year: Not on file  . Ran Out of Food in the Last Year: Not on file  Transportation Needs:   . Lack of Transportation (Medical): Not on file  . Lack of Transportation (Non-Medical): Not on file  Physical Activity:   . Days of Exercise per Week:  Not on file  . Minutes of Exercise per Session: Not on file  Stress:   . Feeling of Stress : Not on file  Social Connections:   . Frequency of Communication with Friends and Family: Not on file  . Frequency of Social Gatherings with Friends and Family: Not on file  . Attends Religious Services: Not on file  . Active Member of Clubs or Organizations: Not on file  . Attends Archivist Meetings: Not on file  . Marital Status: Not on file  Intimate Partner Violence:   . Fear of Current or Ex-Partner: Not on file  . Emotionally Abused: Not on file  . Physically Abused: Not on file  . Sexually Abused: Not on file    Outpatient Medications Prior to Visit  Medication Sig Dispense Refill  . acyclovir (ZOVIRAX) 400 MG tablet Take 400 mg by mouth 2 (two) times daily.    . Adalimumab 40 MG/0.4ML PNKT Inject 0.4 mLs into the skin every Friday.    Marland Kitchen albuterol (PROVENTIL HFA;VENTOLIN HFA) 108 (90 BASE) MCG/ACT inhaler Inhale 2 puffs into the lungs every 6 (six) hours as needed. Wheezing.    Marland Kitchen aspirin-acetaminophen-caffeine (EXCEDRIN MIGRAINE) 250-250-65 MG tablet Take by mouth every 6 (six) hours as needed for headache.    . beclomethasone (QVAR) 80 MCG/ACT inhaler Inhale 1 puff into the lungs daily as needed (sob).    . clindamycin (CLEOCIN) 300 MG capsule Take 300 mg by mouth 2 (two) times daily.    . cloNIDine (CATAPRES) 0.1 MG tablet Take 1 tablet by mouth 3 (three) times daily.    . colchicine 0.6 MG tablet Take 0.6 mg by mouth daily.    Marland Kitchen EPINEPHrine 0.3 mg/0.3 mL IJ SOAJ injection Inject 0.3 mLs (0.3 mg total) into the muscle as needed for anaphylaxis. 1 each 0  . escitalopram (LEXAPRO) 10 MG tablet Take 10 mg by mouth daily.  0  . fluocinonide cream (LIDEX) 7.10 % Apply 1 application topically 2 (two) times daily.    Marland Kitchen gabapentin (NEURONTIN) 300 MG capsule Take 1 capsule (300 mg total) by mouth 3 (three) times daily. 90 capsule 5  . HYDROcodone-acetaminophen (NORCO) 10-325 MG  tablet Take 1 tablet by mouth 5 (five) times daily as needed for moderate pain. 150 tablet 0  . hydrocortisone 2.5 % cream Apply 1 application topically 2 (two) times daily.    . hydroxychloroquine (PLAQUENIL) 200 MG tablet Take 200 mg by mouth 2 (two) times daily.     Marland Kitchen  leucovorin (WELLCOVORIN) 15 MG tablet Take 1 tablet (15 mg) by mouth once a week 18-24 hours after the methotrexate.    Marland Kitchen lisdexamfetamine (VYVANSE) 30 MG capsule Take 30 mg by mouth daily.    . metFORMIN (GLUCOPHAGE) 500 MG tablet Take 500 mg by mouth 2 (two) times daily.    . Methotrexate Sodium (METHOTREXATE, PF,) 250 MG/10ML injection Inject 0.6 mg into the muscle every Tuesday.     . promethazine (PHENERGAN) 25 MG tablet Take 25 mg by mouth every 6 (six) hours as needed.    . rifampin (RIFADIN) 300 MG capsule Take 300 mg by mouth 2 (two) times daily.    Marland Kitchen topiramate (TOPAMAX) 50 MG tablet Take 50 mg by mouth 2 (two) times daily.    . traZODone (DESYREL) 50 MG tablet Take 50 mg by mouth at bedtime.    . varenicline (CHANTIX STARTING MONTH PAK) 0.5 MG X 11 & 1 MG X 42 tablet Take one 0.5 mg tablet by mouth once daily for 3 days, then increase to one 0.5 mg tablet twice daily for 4 days, then increase to one 1 mg tablet twice daily. 53 tablet 0  . Vitamin D, Ergocalciferol, (DRISDOL) 1.25 MG (50000 UNIT) CAPS capsule Take 1 capsule (50,000 Units total) by mouth every 7 (seven) days. 4 capsule 0  . amLODipine (NORVASC) 5 MG tablet TAKE 1 TABLET BY MOUTH EVERY DAY 30 tablet 3  . cetirizine (ZYRTEC) 10 MG tablet Take 1 tablet (10 mg total) by mouth daily. 90 tablet 3  . DEXILANT 60 MG capsule Take 1 capsule (60 mg total) by mouth daily. 30 capsule 5  . furosemide (LASIX) 20 MG tablet Take 1 tablet (20 mg total) by mouth 3 (three) times daily as needed for edema. 30 tablet 3  . ipratropium (ATROVENT) 0.06 % nasal spray Place 2 sprays into both nostrils 4 (four) times daily. 15 mL 0  . lisinopril (ZESTRIL) 20 MG tablet Take 20 mg by  mouth daily.    . propranolol (INDERAL) 40 MG tablet Take 1 tablet (40 mg total) by mouth 2 (two) times daily. 180 tablet 1  . spironolactone (ALDACTONE) 25 MG tablet Take 1 tablet (25 mg total) by mouth daily. 90 tablet 1  . montelukast (SINGULAIR) 10 MG tablet Take 1 tablet (10 mg total) by mouth at bedtime. 90 tablet 2  . HYDROcodone-acetaminophen (NORCO) 10-325 MG tablet Take 1 tablet by mouth 5 (five) times daily as needed. 75 tablet 0   No facility-administered medications prior to visit.    Allergies  Allergen Reactions  . Bee Venom Anaphylaxis  . Ketorolac Hives  . Shrimp [Shellfish Allergy] Anaphylaxis  . Sulfonamide Derivatives Hives, Itching and Other (See Comments)    High fever. Hives and itching all over the body.  Tori Milks [Naproxen]     Grits teeth  . Dilaudid [Hydromorphone Hcl]     migraine  . Morphine And Related Other (See Comments)    Causes migraine  . Tramadol Nausea And Vomiting    ROS Review of Systems   Pertinent negatives listed in HPI Objective:    Physical Exam BP (!) 149/105   Pulse 98   Temp (!) 97.5 F (36.4 C) (Temporal)   Resp 17   Wt 164 lb (74.4 kg)   SpO2 98%   BMI 32.03 kg/m  Wt Readings from Last 3 Encounters:  09/13/20 164 lb (74.4 kg)  08/29/20 166 lb (75.3 kg)  08/23/20 168 lb (76.2 kg)  Health Maintenance Due  Topic Date Due  . INFLUENZA VACCINE  07/31/2020    Influenza vaccine given 09/13/20   There are no preventive care reminders to display for this patient.  Lab Results  Component Value Date   TSH 2.790 04/18/2020   Lab Results  Component Value Date   WBC 9.1 01/25/2020   HGB 14.8 01/25/2020   HCT 44.6 01/25/2020   MCV 91 01/25/2020   PLT 262 01/25/2020   Lab Results  Component Value Date   NA 141 07/28/2020   K 4.5 07/28/2020   CO2 22 07/28/2020   GLUCOSE 88 07/28/2020   BUN 8 07/28/2020   CREATININE 0.64 07/28/2020   BILITOT 0.6 07/28/2020   ALKPHOS 85 07/28/2020   AST 12  07/28/2020   ALT 14 07/28/2020   PROT 6.6 07/28/2020   ALBUMIN 4.3 07/28/2020   CALCIUM 9.3 07/28/2020   ANIONGAP 6 01/08/2019   Lab Results  Component Value Date   CHOL 161 07/28/2020   Lab Results  Component Value Date   HDL 31 (L) 07/28/2020   Lab Results  Component Value Date   LDLCALC 107 (H) 07/28/2020   Lab Results  Component Value Date   TRIG 128 07/28/2020   Lab Results  Component Value Date   CHOLHDL 4.3 01/25/2020   Lab Results  Component Value Date   HGBA1C 5.2 07/28/2020      Assessment & Plan:   Problem List Items Addressed This Visit      Cardiovascular and Mediastinum   Essential hypertension - Primary   Relevant Medications   amLODipine (NORVASC) 5 MG tablet   lisinopril (ZESTRIL) 20 MG tablet   propranolol (INDERAL) 40 MG tablet   furosemide (LASIX) 20 MG tablet   spironolactone (ALDACTONE) 25 MG tablet     Digestive   GERD   Relevant Medications   DEXILANT 60 MG capsule    Other Visit Diagnoses    Needs flu shot       Relevant Orders   Flu Vaccine QUAD 6+ mos PF IM (Fluarix Quad PF)   Seasonal allergies       Relevant Medications   cetirizine (ZYRTEC) 10 MG tablet      Meds ordered this encounter  Medications  . amLODipine (NORVASC) 5 MG tablet    Sig: Take 1 tablet (5 mg total) by mouth daily.    Dispense:  90 tablet    Refill:  1    This prescription was filled on 08/08/2020. Any refills authorized will be placed on file.  . cetirizine (ZYRTEC) 10 MG tablet    Sig: Take 1 tablet (10 mg total) by mouth daily.    Dispense:  90 tablet    Refill:  3  . ipratropium (ATROVENT) 0.06 % nasal spray    Sig: Place 2 sprays into both nostrils 4 (four) times daily.    Dispense:  15 mL    Refill:  0  . lisinopril (ZESTRIL) 20 MG tablet    Sig: Take 1 tablet (20 mg total) by mouth daily.    Dispense:  90 tablet    Refill:  1  . propranolol (INDERAL) 40 MG tablet    Sig: Take 1 tablet (40 mg total) by mouth 2 (two) times daily.     Dispense:  180 tablet    Refill:  1  . DEXILANT 60 MG capsule    Sig: Take 1 capsule (60 mg total) by mouth daily.    Dispense:  90 capsule  Refill:  1  . furosemide (LASIX) 20 MG tablet    Sig: Take 1 tablet (20 mg total) by mouth 3 (three) times daily as needed for edema.    Dispense:  90 tablet    Refill:  1  . spironolactone (ALDACTONE) 25 MG tablet    Sig: Take 1 tablet (25 mg total) by mouth daily.    Dispense:  90 tablet    Refill:  1   Hypertension_ We have discussed target BP range and blood pressure goal. I have advised patient to check BP regularly and to call us back or report to clinic if the numbers are consistently higher than 140/90. We discussed the importance of compliance with medical therapy and DASH diet recommended, consequences of uncontrolled hypertension discussed.  - Continue current BP medications, no changes today  GERD- Refilled PPI,enouraged smoking cessation.   Allergies Refilled antihistamines     Follow-up: Return in about 6 months (around 03/13/2021) for BP f/up.    -The patient was given clear instructions to go to ER or return to medical center if symptoms do not improve, worsen or new problems develop. The patient verbalized understanding.   Molli Barrows, FNP Primary Care at [facility] [address], [place] [phone]: [phone]

## 2020-09-13 NOTE — Patient Instructions (Signed)
Continue to check blood pressure at home. If readings after taking all medications are consistently greater than 140/90, schedule a follow-up sooner than 6 months. Continue weight loss efforts. Try and walk at minutes 15-20 minutes daily to improve heart health. Continue efforts to stop smoking and or cut back. Schedule COVID booster vaccine.     Hypertension, Adult Hypertension is another name for high blood pressure. High blood pressure forces your heart to work harder to pump blood. This can cause problems over time. There are two numbers in a blood pressure reading. There is a top number (systolic) over a bottom number (diastolic). It is best to have a blood pressure that is below 120/80. Healthy choices can help lower your blood pressure, or you may need medicine to help lower it. What are the causes? The cause of this condition is not known. Some conditions may be related to high blood pressure. What increases the risk?  Smoking.  Having type 2 diabetes mellitus, high cholesterol, or both.  Not getting enough exercise or physical activity.  Being overweight.  Having too much fat, sugar, calories, or salt (sodium) in your diet.  Drinking too much alcohol.  Having long-term (chronic) kidney disease.  Having a family history of high blood pressure.  Age. Risk increases with age.  Race. You may be at higher risk if you are African American.  Gender. Men are at higher risk than women before age 83. After age 23, women are at higher risk than men.  Having obstructive sleep apnea.  Stress. What are the signs or symptoms?  High blood pressure may not cause symptoms. Very high blood pressure (hypertensive crisis) may cause: ? Headache. ? Feelings of worry or nervousness (anxiety). ? Shortness of breath. ? Nosebleed. ? A feeling of being sick to your stomach (nausea). ? Throwing up (vomiting). ? Changes in how you see. ? Very bad chest pain. ? Seizures. How is this  treated?  This condition is treated by making healthy lifestyle changes, such as: ? Eating healthy foods. ? Exercising more. ? Drinking less alcohol.  Your health care provider may prescribe medicine if lifestyle changes are not enough to get your blood pressure under control, and if: ? Your top number is above 130. ? Your bottom number is above 80.  Your personal target blood pressure may vary. Follow these instructions at home: Eating and drinking   If told, follow the DASH eating plan. To follow this plan: ? Fill one half of your plate at each meal with fruits and vegetables. ? Fill one fourth of your plate at each meal with whole grains. Whole grains include whole-wheat pasta, brown rice, and whole-grain bread. ? Eat or drink low-fat dairy products, such as skim milk or low-fat yogurt. ? Fill one fourth of your plate at each meal with low-fat (lean) proteins. Low-fat proteins include fish, chicken without skin, eggs, beans, and tofu. ? Avoid fatty meat, cured and processed meat, or chicken with skin. ? Avoid pre-made or processed food.  Eat less than 1,500 mg of salt each day.  Do not drink alcohol if: ? Your doctor tells you not to drink. ? You are pregnant, may be pregnant, or are planning to become pregnant.  If you drink alcohol: ? Limit how much you use to:  0-1 drink a day for women.  0-2 drinks a day for men. ? Be aware of how much alcohol is in your drink. In the [place] one drink equals one 12 oz bottle  of beer (355 mL), one 5 oz glass of wine (148 mL), or one 1 oz glass of hard liquor (44 mL). Lifestyle   Work with your doctor to stay at a healthy weight or to lose weight. Ask your doctor what the best weight is for you.  Get at least 30 minutes of exercise most days of the week. This may include walking, swimming, or biking.  Get at least 30 minutes of exercise that strengthens your muscles (resistance exercise) at least 3 days a week. This may include  lifting weights or doing Pilates.  Do not use any products that contain nicotine or tobacco, such as cigarettes, e-cigarettes, and chewing tobacco. If you need help quitting, ask your doctor.  Check your blood pressure at home as told by your doctor.  Keep all follow-up visits as told by your doctor. This is important. Medicines  Take over-the-counter and prescription medicines only as told by your doctor. Follow directions carefully.  Do not skip doses of blood pressure medicine. The medicine does not work as well if you skip doses. Skipping doses also puts you at risk for problems.  Ask your doctor about side effects or reactions to medicines that you should watch for. Contact a doctor if you:  Think you are having a reaction to the medicine you are taking.  Have headaches that keep coming back (recurring).  Feel dizzy.  Have swelling in your ankles.  Have trouble with your vision. Get help right away if you:  Get a very bad headache.  Start to feel mixed up (confused).  Feel weak or numb.  Feel faint.  Have very bad pain in your: ? Chest. ? Belly (abdomen).  Throw up more than once.  Have trouble breathing. Summary  Hypertension is another name for high blood pressure.  High blood pressure forces your heart to work harder to pump blood.  For most people, a normal blood pressure is less than 120/80.  Making healthy choices can help lower blood pressure. If your blood pressure does not get lower with healthy choices, you may need to take medicine. This information is not intended to replace advice given to you by your health care provider. Make sure you discuss any questions you have with your health care provider. Document Revised: 08/27/2018 Document Reviewed: 08/27/2018 Elsevier Patient Education  2020 [employer].

## 2020-09-15 ENCOUNTER — Ambulatory Visit: Admit: 2020-09-15 | Discharge: 2020-09-28 | Payer: MEDICAID

## 2020-09-15 ENCOUNTER — Ambulatory Visit: Admit: 2020-09-15 | Discharge: 2020-09-17 | Payer: MEDICAID

## 2020-09-15 MED ADMIN — Tc-99m Sestamibi (Cardiolite): 14.4 | INTRAVENOUS | @ 15:00:00 | Stop: 2020-09-15

## 2020-09-15 MED ADMIN — regadenoson (LEXISCAN) injection: .4 mg | INTRAVENOUS | @ 15:00:00 | Stop: 2020-09-15

## 2020-09-15 MED ADMIN — Tc-99m Sestamibi (Cardiolite): 45.8 | INTRAVENOUS | @ 17:00:00 | Stop: 2020-09-15

## 2020-09-19 ENCOUNTER — Ambulatory Visit: Payer: Medicaid Other | Admitting: Internal Medicine

## 2020-09-19 ENCOUNTER — Telehealth: Payer: Self-pay

## 2020-09-19 NOTE — Telephone Encounter (Signed)
Initiated prior authorization for patient's Dexilant. Awaiting results.

## 2020-09-20 ENCOUNTER — Other Ambulatory Visit: Payer: Self-pay

## 2020-09-20 ENCOUNTER — Ambulatory Visit (INDEPENDENT_AMBULATORY_CARE_PROVIDER_SITE_OTHER): Payer: Medicaid Other | Admitting: Bariatrics

## 2020-09-20 ENCOUNTER — Encounter (INDEPENDENT_AMBULATORY_CARE_PROVIDER_SITE_OTHER): Payer: Self-pay | Admitting: Bariatrics

## 2020-09-20 VITALS — BP 141/100 | HR 101 | Temp 98.3°F | Ht 60.0 in | Wt 163.0 lb

## 2020-09-20 DIAGNOSIS — E559 Vitamin D deficiency, unspecified: Secondary | ICD-10-CM

## 2020-09-20 DIAGNOSIS — E669 Obesity, unspecified: Secondary | ICD-10-CM | POA: Diagnosis not present

## 2020-09-20 DIAGNOSIS — I10 Essential (primary) hypertension: Secondary | ICD-10-CM | POA: Diagnosis not present

## 2020-09-20 DIAGNOSIS — Z6832 Body mass index (BMI) 32.0-32.9, adult: Secondary | ICD-10-CM | POA: Diagnosis not present

## 2020-09-20 MED ORDER — VITAMIN D (ERGOCALCIFEROL) 1.25 MG (50000 UNIT) PO CAPS: 50000.0000 [IU] | ORAL_CAPSULE | ORAL | 0 refills | Status: DC

## 2020-09-22 ENCOUNTER — Other Ambulatory Visit: Payer: Self-pay

## 2020-09-22 ENCOUNTER — Telehealth: Payer: Self-pay

## 2020-09-22 ENCOUNTER — Encounter (INDEPENDENT_AMBULATORY_CARE_PROVIDER_SITE_OTHER): Payer: Self-pay | Admitting: Bariatrics

## 2020-09-22 ENCOUNTER — Encounter: Payer: Self-pay | Admitting: Physical Medicine and Rehabilitation

## 2020-09-22 ENCOUNTER — Encounter
Payer: Medicaid Other | Attending: Physical Medicine and Rehabilitation | Admitting: Physical Medicine and Rehabilitation

## 2020-09-22 VITALS — BP 131/96 | HR 111 | Temp 98.7°F | Ht 60.0 in | Wt 165.0 lb

## 2020-09-22 DIAGNOSIS — M5136 Other intervertebral disc degeneration, lumbar region: Secondary | ICD-10-CM | POA: Insufficient documentation

## 2020-09-22 DIAGNOSIS — M329 Systemic lupus erythematosus, unspecified: Secondary | ICD-10-CM | POA: Diagnosis present

## 2020-09-22 DIAGNOSIS — G894 Chronic pain syndrome: Secondary | ICD-10-CM

## 2020-09-22 DIAGNOSIS — M797 Fibromyalgia: Secondary | ICD-10-CM

## 2020-09-22 DIAGNOSIS — M25511 Pain in right shoulder: Secondary | ICD-10-CM | POA: Diagnosis present

## 2020-09-22 DIAGNOSIS — R2689 Other abnormalities of gait and mobility: Secondary | ICD-10-CM | POA: Diagnosis present

## 2020-09-22 DIAGNOSIS — M4120 Other idiopathic scoliosis, site unspecified: Secondary | ICD-10-CM | POA: Diagnosis present

## 2020-09-22 DIAGNOSIS — Z79891 Long term (current) use of opiate analgesic: Secondary | ICD-10-CM | POA: Diagnosis present

## 2020-09-22 DIAGNOSIS — R800 Isolated proteinuria: Secondary | ICD-10-CM | POA: Insufficient documentation

## 2020-09-22 DIAGNOSIS — M7918 Myalgia, other site: Secondary | ICD-10-CM

## 2020-09-22 DIAGNOSIS — G8929 Other chronic pain: Secondary | ICD-10-CM | POA: Insufficient documentation

## 2020-09-22 DIAGNOSIS — Z5181 Encounter for therapeutic drug level monitoring: Secondary | ICD-10-CM

## 2020-09-22 DIAGNOSIS — M5441 Lumbago with sciatica, right side: Secondary | ICD-10-CM | POA: Diagnosis present

## 2020-09-22 DIAGNOSIS — M25512 Pain in left shoulder: Secondary | ICD-10-CM | POA: Insufficient documentation

## 2020-09-22 DIAGNOSIS — I10 Essential (primary) hypertension: Secondary | ICD-10-CM | POA: Insufficient documentation

## 2020-09-22 DIAGNOSIS — M5442 Lumbago with sciatica, left side: Secondary | ICD-10-CM | POA: Insufficient documentation

## 2020-09-22 MED ORDER — DICLOFENAC SODIUM 1 % EX GEL: 2.0000 g | Freq: Four times a day (QID) | CUTANEOUS | 3 refills | Status: AC

## 2020-09-22 MED ORDER — HYDROCODONE-ACETAMINOPHEN 10-325 MG PO TABS
1.0000 | ORAL_TABLET | Freq: Every day | ORAL | 0 refills | Status: DC | PRN
Start: 2020-09-22 — End: 2020-10-20

## 2020-09-22 MED ORDER — GABAPENTIN 300 MG PO CAPS: ORAL_CAPSULE | ORAL | 5 refills | Status: AC

## 2020-09-22 MED ORDER — HYDROCODONE-ACETAMINOPHEN 7.5-325 MG PO TABS
1.0000 | ORAL_TABLET | Freq: Four times a day (QID) | ORAL | 0 refills | Status: DC | PRN
Start: 2020-09-22 — End: 2020-09-22

## 2020-09-22 NOTE — Telephone Encounter (Signed)
Please call [facility] to clarify Rx Gabapentin directons . Directions may need to be re-worded for insurance coverage.  Call back phone [phone].

## 2020-09-22 NOTE — Progress Notes (Signed)
Chief Complaint:   OBESITY [name] is here to discuss her progress with her obesity treatment plan along with follow-up of her obesity related diagnoses. [name] is on the Category 1 Plan and states she is following her eating plan approximately 75% of the time. [name] states she is playing with kids on the playground for exercise 1 time per week.   Today's visit was #: 8 Starting weight: 166 lbs Starting date: 04/18/2020 Today's weight: 163 lbs Today's date: 09/20/2020 Total lbs lost to date: 3 Total lbs lost since last in-office visit: 3  Interim History: [name] is down 3 lbs since her last visit. She reports doing well with her water intake.  Subjective:   Essential hypertension. [name] is taking Norvasc, Catapres, Zestril, and Aldactone. Blood pressure is not controlled. She did not take her medication today.  BP Readings from Last 3 Encounters:  09/22/20 (!) 131/96  09/20/20 (!) 141/100  09/13/20 (!) 149/105   Lab Results  Component Value Date   CREATININE 0.64 07/28/2020   CREATININE 0.77 07/12/2020   CREATININE 0.63 01/25/2020   Vitamin D deficiency. [name] reports minimal sunlight exposure.   Ref. Range 07/28/2020 13:17  Vitamin D, 25-Hydroxy Latest Ref Range: 30.0 - 100.0 ng/mL 46.8   Assessment/Plan:   Essential hypertension. [name] is working on healthy weight loss and exercise to improve blood pressure control. We will watch for signs of hypotension as she continues her lifestyle modifications. [name] will take her blood pressure medications as soon as she can and will not miss doses. She will decrease her salt intake and avoid added salt.  Vitamin D deficiency. Low Vitamin D level contributes to fatigue and are associated with obesity, breast, and colon cancer. She was given a prescription for Vitamin D, Ergocalciferol, (DRISDOL) 1.25 MG (50000 UNIT) CAPS capsule every week #4 with 0 refills and will follow-up for routine testing of Vitamin D, at least  2-3 times per year to avoid over-replacement.   Class 1 obesity with serious comorbidity and body mass index (BMI) of 32.0 to 32.9 in adult, unspecified obesity type.  [name] is currently in the action stage of change. As such, her goal is to continue with weight loss efforts. She has agreed to the Category 1 Plan.   She will work on meal planning, intentional eating, increasing protein, and avoiding skipping meals.  Exercise goals: All adults should avoid inactivity. Some physical activity is better than none, and adults who participate in any amount of physical activity gain some health benefits.  Behavioral modification strategies: increasing lean protein intake, decreasing simple carbohydrates, increasing vegetables, increasing water intake, decreasing eating out, no skipping meals, meal planning and cooking strategies, keeping healthy foods in the home and planning for success.  [name] has agreed to follow-up with our clinic in 3-4 weeks. She was informed of the importance of frequent follow-up visits to maximize her success with intensive lifestyle modifications for her multiple health conditions.   Objective:   Blood pressure (!) 141/100, pulse (!) 101, temperature 98.3 F (36.8 C), temperature source Oral, height 5' (1.524 m), weight 163 lb (73.9 kg), SpO2 98 %. Body mass index is 31.83 kg/m.  General: Cooperative, alert, well developed, in no acute distress. HEENT: Conjunctivae and lids unremarkable. Cardiovascular: Regular rhythm.  Lungs: Normal work of breathing. Neurologic: No focal deficits.   Lab Results  Component Value Date   CREATININE 0.64 07/28/2020   BUN 8 07/28/2020   NA 141 07/28/2020   K 4.5  07/28/2020   CL 104 07/28/2020   CO2 22 07/28/2020   Lab Results  Component Value Date   ALT 14 07/28/2020   AST 12 07/28/2020   ALKPHOS 85 07/28/2020   BILITOT 0.6 07/28/2020   Lab Results  Component Value Date   HGBA1C 5.2 07/28/2020   HGBA1C 5.3 01/25/2020     Lab Results  Component Value Date   INSULIN 16.6 07/28/2020   INSULIN 8.4 04/18/2020   Lab Results  Component Value Date   TSH 2.790 04/18/2020   Lab Results  Component Value Date   CHOL 161 07/28/2020   HDL 31 (L) 07/28/2020   LDLCALC 107 (H) 07/28/2020   TRIG 128 07/28/2020   CHOLHDL 4.3 01/25/2020   Lab Results  Component Value Date   WBC 9.1 01/25/2020   HGB 14.8 01/25/2020   HCT 44.6 01/25/2020   MCV 91 01/25/2020   PLT 262 01/25/2020   No results found for: IRON, TIBC, FERRITIN  Attestation Statements:   Reviewed by clinician on day of visit: allergies, medications, problem list, medical history, surgical history, family history, social history, and previous encounter notes.  Migdalia Dk, am acting as Location manager for [employer], DO   I have reviewed the above documentation for accuracy and completeness, and I agree with the above. Jearld Lesch, DO

## 2020-09-22 NOTE — Progress Notes (Signed)
Subjective:    Patient ID: [name], female    DOB: 08-16-1981, 39 y.o.   MRN: [id number]  HPI  [name] is a 39 year old woman who presents for follow-up of her diffuse joint pains secondary to her rheumatoid arthritis and fibromyalgia.   She has been having more pain recently due to stress from her daughter cutting herself and finding alcohol in her daughter's room. Her daughter is a very smart girl and she is scared that she if going off path. She has discussed this with her daughter and feels she is listening to her. She is seeking behavioral health counseling. This has caused her a lot of stress and resulted in lupus flare, worsening her joint pain. She maintains up to 5 10mg  Norcos per day. She has not tried voltaren gel. The Gabapentin dose help her- she takes 300mg  TID.   She has been sleeping poorly.  Continues to try to exercise.   Pain Inventory Average Pain 6 Pain Right Now 6 My pain is constant, stabbing and aching  In the last 24 hours, has pain interfered with the following? General activity 10 Relation with others 10 Enjoyment of life 10 What TIME of day is your pain at its worst? morning  and night Sleep (in general) Fair  Pain is worse with: bending, sitting, standing and some activites Pain improves with: rest, heat/ice and medication Relief from Meds: 8  Family History  Problem Relation Age of Onset  . Heart disease Mother   . Diabetes Mother   . Hypertension Mother   . High Cholesterol Mother   . Thyroid disease Mother   . Depression Mother   . Obesity Mother   . Heart disease Father   . Diabetes Father   . Hypertension Father   . High Cholesterol Father   . Alcoholism Father   . Bipolar disorder Brother   . Cancer Brother        prostate  . Pancreatic cancer Maternal Grandmother   . Cancer Maternal Grandmother        pancreatic  . Cancer Maternal Uncle        lung  . Cancer Paternal Aunt   . Bipolar disorder Sister    Social History     Socioeconomic History  . Marital status: Married    Spouse name: Larkin Ina  . Number of children: Not on file  . Years of education: Not on file  . Highest education level: Not on file  Occupational History  . Occupation: stay at home mom  Tobacco Use  . Smoking status: Current Every Day Smoker    Packs/day: 1.00    Years: 11.00    Pack years: 11.00    Types: Cigarettes  . Smokeless tobacco: Never Used  . Tobacco comment: too much stress  Vaping Use  . Vaping Use: Never used  Substance and Sexual Activity  . Alcohol use: No  . Drug use: No  . Sexual activity: Yes    Birth control/protection: Surgical  Other Topics Concern  . Not on file  Social History Narrative   At [employer] wanting to get apply to nursing school.  Plans to apply 2/13.     Social Determinants of Health   Financial Resource Strain:   . Difficulty of Paying Living Expenses: Not on file  Food Insecurity:   . Worried About Charity fundraiser in the Last Year: Not on file  . Ran Out of Food in the Last Year: Not  on file  Transportation Needs:   . Lack of Transportation (Medical): Not on file  . Lack of Transportation (Non-Medical): Not on file  Physical Activity:   . Days of Exercise per Week: Not on file  . Minutes of Exercise per Session: Not on file  Stress:   . Feeling of Stress : Not on file  Social Connections:   . Frequency of Communication with Friends and Family: Not on file  . Frequency of Social Gatherings with Friends and Family: Not on file  . Attends Religious Services: Not on file  . Active Member of Clubs or Organizations: Not on file  . Attends Archivist Meetings: Not on file  . Marital Status: Not on file   Past Surgical History:  Procedure Laterality Date  . ABDOMINAL HYSTERECTOMY  06/30/2008   partial  . AXILLARY HIDRADENITIS EXCISION     bilateral  . CESAREAN SECTION  08/31/2006, 08/26/2007  . LAPAROSCOPIC ENDOMETRIOSIS FULGURATION  06/1999  . MASS EXCISION  06/19/2012    Procedure: EXCISION MASS;  Surgeon: Merrie Roof, MD;  Location: [place];  Service: General;  Laterality: Right;  excision mass right abdominal wall   Past Surgical History:  Procedure Laterality Date  . ABDOMINAL HYSTERECTOMY  06/30/2008   partial  . AXILLARY HIDRADENITIS EXCISION     bilateral  . CESAREAN SECTION  08/31/2006, 08/26/2007  . LAPAROSCOPIC ENDOMETRIOSIS FULGURATION  06/1999  . MASS EXCISION  06/19/2012   Procedure: EXCISION MASS;  Surgeon: Merrie Roof, MD;  Location: [place];  Service: General;  Laterality: Right;  excision mass right abdominal wall   Past Medical History:  Diagnosis Date  . Abdominal wall mass   . ADHD (attention deficit hyperactivity disorder)   . Anemia   . Arthritis   . Asthma   . Back pain   . Chest pain   . Depression    tried several meds, not on any now  . Diarrhea   . Endometriosis   . Headache(784.0)   . Hydradenitis   . Hydradenitis 07/13/2011  . Hypertension    on inderal  . Leukemia ([place]) at age 52  . Lupus ([place])   . Migraines   . MRSA (methicillin resistant staph aureus) culture positive 2010   ear infection that cultured out MRSA   . Nausea   . S/P total abdominal hysterectomy 06/26/2011  . Scoliosis   . Thyroid disease    BP (!) 131/96   Pulse (!) 111   Temp 98.7 F (37.1 C)   Ht 5' (1.524 m)   Wt 165 lb (74.8 kg)   SpO2 95%   BMI 32.22 kg/m   Opioid Risk Score:   Fall Risk Score:  `1  Depression screen PHQ 2/9  Depression screen [facility] 2/9 05/11/2020 04/18/2020 03/18/2020 05/06/2012  Decreased Interest 3 3 1 1   Down, Depressed, Hopeless 0 3 0 1  PHQ - 2 Score 3 6 1 2   Altered sleeping 3 3 1  -  Tired, decreased energy 3 3 3  -  Change in appetite 0 3 0 -  Feeling bad or failure about yourself  0 3 1 -  Trouble concentrating 3 3 1  -  Moving slowly or fidgety/restless 0 0 0 -  Suicidal thoughts 0 0 0 -  PHQ-9 Score 12 21 7  -  Difficult doing work/chores - Somewhat difficult - -     Review of Systems  Musculoskeletal: Positive for back pain and joint  swelling.       Leg pain Hip pain Knee pain Shoulder pain Elbow pain   Neurological: Positive for numbness.  All other systems reviewed and are negative.      Objective:   Physical Exam  Gen: no distress, normal appearing HEENT: oral mucosa pink and moist, NCAT Cardio: Reg rate Chest: normal effort, normal rate of breathing Abd: soft, non-distended Ext: no edema Skin: malar rash Neuro: Alert and oriented x3.  Musculoskeletal: 5/5 strength throughout. Diffuse tenderness to palpation in muscles and joints of upper and lower extremities.  Psych: pleasant, normal affect        Assessment & Plan:  [name] is a 39 year old woman who presents with diffuse joint pain. She has a history of fibromyalgia, Lupus, scoliosis, Sjogren's Disease, and lumbar disc disease.   Fibromyalgia There is presence of widespread pain above and below the waist on both sides for more than 3 months that is consistent with fibromyalgia. There are related symptoms of fatigue and cognitive dysfunction.  Recommended30 minutes of aerobic exercise 5 times per week and resistance exercise of all major muscle groups at least twice per week. Educated patient that this is a crucial component of recovery and that self-responsibility is key.She is already very good about going to [employer] with her daughter 5x per week which is excellent!  Continue phsychology visits which appear to be greatly helping her.   Lumbar disc degeneration: -Appears to be a component of neuropathic pain in both legs, no evidence of radiculopathy or stenosis on 2016 MRI.Ordered lumbar XR to assess for stenosis since it has been 5 years since last imaging.It showed no acute findings with mild convex rightward lumbar scoliosis- discussed with patient. -Prescribed physical therapy for core and paraspinal stretching and strengthening.She has been  attending these appointments with benefit. I have reviewed notes.  -Pain contract and UDS signedfirst visit. -Refilled Norco 10mg  with 325mg  Tylenol up to 5 times per day as needed.Has been helping to control her pain and she has been taking as prescribed. PDMP reviewed and patient is not receiving this medication from any other providers.  -Increase Gabapentin to 300/300/600- will help with pain and insomnia.  -Prescribed diclofenac gel for her joints -Routine UDS ordered  Quality of Life: -Continueto encourage return to work, which is one of patient's goals.She enjoyed her time at beach with her family. She is proud of her children's success at their school. She plans to go to a concert with her husband and asks whether she should reschedule due to [place]. She has received both vaccines. I advised that this does decrease her chances of acquiring the Delta variant, though not completely.  -Discussed her stress with her daughter and provided positive reinforcement that she is doing the right thing.   Hypotension: -Decreased amlodipine to 5mg  last visit. Provided script. Log pressures and bring to f/u appointment. Amlodipine may also be contributing to leg swelling. Hopefully with more exercise and better nutrition we can get her off this medication. Current BP is 262/03- systolic is well controlled but diastolic is slightly elevated.   Vitamin D deficiency: completed high dose treatment, Advised that deficiency can also contribute to pain.  Bilateral shoulder pain: Discussed results of shoulder XRs with patient- both were stable.   Leg swelling: Advised ice, compression garments, elevation.Continue spironolactone. Prescribed Lasix needed. Bmp obtained to check creatinine. Result was normal: discussed with patient.   Obesity: Advised low carb diet. Patient eager to follow and will involve her  husband who is very supportive. Lost 3 lbs since last visit! Currently 165 lbs. Continue to  monitor each visit.   Cervical myofascial pain syndrome: trigger points next visit.   All questions answered. RTC in 1 month.

## 2020-09-23 ENCOUNTER — Telehealth: Payer: Self-pay | Admitting: *Deleted

## 2020-09-23 NOTE — Telephone Encounter (Addendum)
Prior auth submitted via //[place] [employer] access.   APPROVED  09/23/2020 - 03/22/2021

## 2020-09-23 NOTE — Telephone Encounter (Signed)
Hydrocodone needs prior auth. [name] is handling the request

## 2020-09-23 NOTE — Telephone Encounter (Signed)
Called, thank you!

## 2020-09-25 LAB — TOXASSURE SELECT,+ANTIDEPR,UR

## 2020-09-29 MED ORDER — EMPTY CONTAINER
2 refills | 0 days
Start: 2020-09-29 — End: ?

## 2020-09-29 NOTE — Unmapped (Signed)
[facility] Clinical Assessment & Refill Coordination Note    Kim Charles, DOB: June 14, 1981  Phone: There are no phone numbers on file.    All above HIPAA information was verified with patient.     Was a Nurse, learning disability used for this call? No    Specialty Medication(s):   Inflammatory Disorders: Humira     Current Outpatient Medications   Medication Sig Dispense Refill   ??? acyclovir (ZOVIRAX) 400 MG tablet Take 1 tablet by mouth by mouth 2 times daily for prophylaxis  6   ??? albuterol (PROVENTIL HFA;VENTOLIN HFA) 90 mcg/actuation inhaler Inhale 2 puffs daily as needed.      ??? amLODIPine (NORVASC) 5 MG tablet Take 10 mg by mouth daily.      ??? aspirin-acetaminophen-caffeine (EXCEDRIN MIGRAINE) 250-250-65 mg per tablet Take 2 tablets by mouth.     ??? BD TUBERCULIN SYRINGE 1 mL 25 gauge x 5/8 Syrg Use 1 syringe every 7 days with methotrexate 50 each 1   ??? beclomethasone (QVAR) 40 mcg/actuation inhaler Inhale 2 puffs daily as needed.      ??? clindamycin (CLEOCIN) 300 MG capsule Take 1 capsule (300 mg total) by mouth Two (2) times a day. (Patient not taking: Reported on 09/02/2020) 60 capsule 0   ??? cloNIDine HCl (CATAPRES) 0.1 MG tablet Take 0.1 mg by mouth Three (3) times a day.      ??? dexlansoprazole (DEXILANT) 60 mg capsule Take 60 mg by mouth.     ??? EPINEPHrine (EPIPEN) 0.3 mg/0.3 mL injection Inject 0.3 mg into the muscle. (Patient not taking: Reported on 09/02/2020)     ??? ergocalciferol-1,250 mcg, 50,000 unit, (DRISDOL) 1,250 mcg (50,000 unit) capsule Take 50,000 Units by mouth.     ??? escitalopram oxalate (LEXAPRO) 10 MG tablet Take 10 mg by mouth daily.  3   ??? fluocinonide (LIDEX) 0.05 % cream Apply 1 application topically Two (2) times a day. To rash on face. Use for up to 2 weeks at a time. Practice sun protection. 30 g 1   ??? fluticasone propionate (FLOVENT HFA) 220 mcg/actuation inhaler 2 puffs.     ??? gabapentin (NEURONTIN) 300 MG capsule Take 300 mg by mouth Three (3) times a day.     ??? HUMIRA PEN CITRATE FREE 40 MG/0.4 ML Inject the contents of 1 pen (40 mg total) under the skin every seven (7) days. 4 each 4   ??? hydroCHLOROthiazide (HYDRODIURIL) 25 MG tablet Take 200 mg by mouth.      ??? HYDROcodone-acetaminophen (NORCO 10-325) 10-325 mg per tablet Take 1 tablet by mouth 5 times daily as needed for pain     ??? hydrocortisone 2.5 % cream 1-2 times a day on the red areas on the face for 1 week straight maximum. 30 g 1   ??? hydrOXYchloroQUINE (PLAQUENIL) 200 mg tablet Take 2 tablets (400 mg total) by mouth daily. 180 tablet 3   ??? HYSINGLA ER 20 mg TP24 Take 1 tablet by mouth every 24 hours     ??? ipratropium (ATROVENT) 0.03 % nasal spray 2 sprays into each nostril.     ??? leucovorin (WELLCOVORIN) 5 mg tablet Take 3 tablets (15 mg total) by mouth daily. Take 15 mg once a week 18-24 hours after the methotrexate. 90 tablet 3   ??? lidocaine (LIDODERM) 5 % patch Apply 1 patch to skin as directed 12 hours on, 12 hours off     ??? lisinopriL (PRINIVIL,ZESTRIL) 20 MG tablet Take  1 tablet (20 mg total) by mouth daily. 30 tablet 6   ??? metFORMIN (GLUCOPHAGE) 500 MG tablet TAKE 1 TABLET BY MOUTH 2 TIMES DAILY WITH MEALS 180 tablet 3   ??? methotrexate, Preservative Free, 25 mg/mL Soln vial/syringe inject 0.27mls under the skin once a week 12 mL 1   ??? MITIGARE 0.6 mg cap capsule Take 0.6 mg by mouth daily.     ??? montelukast (SINGULAIR) 10 mg tablet Take 10 mg by mouth nightly.     ??? NARCAN 4 mg/actuation nasal spray spray (4 mg) in 1 nostril may repeat dose every 2-3 minutes as needed alternating nostrils with each dose     ??? promethazine (PHENERGAN) 25 MG tablet Take 1 tablet (25 mg total) by mouth every six (6) hours as needed for nausea. 30 tablet 3   ??? propranolol (INDERAL) 40 MG tablet Take 40 mg by mouth Two (2) times a day.      ??? rifAMPin (RIFADIN) 300 MG capsule Take 1 capsule (300 mg total) by mouth Two (2) times a day. 60 capsule 0   ??? spironolactone (ALDACTONE) 25 MG tablet Take 25 mg by mouth.     ??? topiramate (TOPAMAX) 50 MG tablet TAKE 1 TABLET BY MOUTH 2 TIMES DAILY  5   ??? traZODone (DESYREL) 50 MG tablet Take 50 mg by mouth.     ??? VYVANSE 30 mg capsule Take 30 mg by mouth two (2) times a day.   0     No current facility-administered medications for this visit.        Changes to medications: Buena reports no changes at this time.    Allergies   Allergen Reactions   ??? Other Hives, Itching and Other (See Comments)     High fever. Hives and itching all over the body.   ??? Shellfish Containing Products Anaphylaxis   ??? Sulfasalazine Hives   ??? Toradol [Ketorolac] Hives   ??? Venom-Honey Bee Anaphylaxis   ??? Opioids - Morphine Analogues      Other reaction(s): Other  Other reaction(s): Other (See Comments)  Causes migraine  Headache   ??? Tramadol Nausea And Vomiting   ??? Hydromorphone Hcl      migraine   ??? Naproxen      Grits teeth       Changes to allergies: No    SPECIALTY MEDICATION ADHERENCE     Humira 40/0.4 mg/ml: 16 days of medicine on hand       Medication Adherence    Patient reported X missed doses in the last month: 0  Specialty Medication: Humira 40 mg/0.4 ml  Informant: patient  Confirmed plan for next specialty medication refill: delivery by pharmacy  Refills needed for supportive medications: not needed          Specialty medication(s) dose(s) confirmed: Regimen is correct and unchanged.     Are there any concerns with adherence? No    Adherence counseling provided? Not needed    CLINICAL MANAGEMENT AND INTERVENTION      Clinical Benefit Assessment:    Do you feel the medicine is effective or helping your condition? Yes    Clinical Benefit counseling provided? Not needed    Adverse Effects Assessment:    Are you experiencing any side effects? No    Are you experiencing difficulty administering your medicine? No    Quality of Life Assessment:    How many days over the past month did your HS  keep you from your normal  activities? For example, brushing your teeth or getting up in the morning. 0    Have you discussed this with your provider? Not needed    Therapy Appropriateness:    Is therapy appropriate? Yes, therapy is appropriate and should be continued    DISEASE/MEDICATION-SPECIFIC INFORMATION      For patients on injectable medications: Patient currently has 1 doses left.  Next injection is scheduled for 10/01/20.    PATIENT SPECIFIC NEEDS     - Does the patient have any physical, cognitive, or cultural barriers? No    - Is the patient high risk? No    - Does the patient require a Care Management Plan? No     - Does the patient require physician intervention or other additional services (i.e. nutrition, smoking cessation, social work)? No      SHIPPING     Specialty Medication(s) to be Shipped:   Inflammatory Disorders: Humira    Other medication(s) to be shipped: Transport planner     Changes to insurance: No    Delivery Scheduled: Yes, Expected medication delivery date: 10/07/20.     Medication will be delivered via UPS to the confirmed prescription address in [facility].    The patient will receive a drug information handout for each medication shipped and additional FDA Medication Guides as required.  Verified that patient has previously received a Conservation officer, historic buildings.    All of the patient's questions and concerns have been addressed.    Chizuko Trine Vangie Bicker   [facility] Specialty Pharmacist

## 2020-09-30 ENCOUNTER — Telehealth: Payer: Self-pay | Admitting: *Deleted

## 2020-09-30 NOTE — Telephone Encounter (Signed)
Urine drug screen for this encounter is consistent for prescribed medication 

## 2020-10-04 ENCOUNTER — Other Ambulatory Visit: Payer: Self-pay | Admitting: Nurse Practitioner

## 2020-10-04 DIAGNOSIS — J302 Other seasonal allergic rhinitis: Secondary | ICD-10-CM

## 2020-10-04 NOTE — Telephone Encounter (Signed)
Patient seen at [facility]- request for RF- sent for review

## 2020-10-06 MED FILL — EMPTY CONTAINER: 120 days supply | Qty: 1 | Fill #0

## 2020-10-06 MED FILL — HUMIRA PEN CITRATE FREE 40 MG/0.4 ML: 28 days supply | Qty: 4 | Fill #4 | Status: AC

## 2020-10-06 MED FILL — EMPTY CONTAINER: 120 days supply | Qty: 1 | Fill #0 | Status: AC

## 2020-10-06 MED FILL — HUMIRA PEN CITRATE FREE 40 MG/0.4 ML: SUBCUTANEOUS | 28 days supply | Qty: 4 | Fill #4

## 2020-10-12 ENCOUNTER — Other Ambulatory Visit: Payer: Self-pay

## 2020-10-12 ENCOUNTER — Ambulatory Visit (INDEPENDENT_AMBULATORY_CARE_PROVIDER_SITE_OTHER): Payer: Medicaid Other | Admitting: Bariatrics

## 2020-10-12 ENCOUNTER — Encounter (INDEPENDENT_AMBULATORY_CARE_PROVIDER_SITE_OTHER): Payer: Self-pay | Admitting: Bariatrics

## 2020-10-12 ENCOUNTER — Other Ambulatory Visit: Payer: Self-pay | Admitting: Nurse Practitioner

## 2020-10-12 VITALS — BP 111/78 | HR 97 | Temp 98.7°F | Ht 60.0 in | Wt 160.0 lb

## 2020-10-12 DIAGNOSIS — E669 Obesity, unspecified: Secondary | ICD-10-CM | POA: Diagnosis not present

## 2020-10-12 DIAGNOSIS — G8929 Other chronic pain: Secondary | ICD-10-CM | POA: Diagnosis not present

## 2020-10-12 DIAGNOSIS — M549 Dorsalgia, unspecified: Secondary | ICD-10-CM

## 2020-10-12 DIAGNOSIS — E559 Vitamin D deficiency, unspecified: Secondary | ICD-10-CM | POA: Diagnosis not present

## 2020-10-12 DIAGNOSIS — Z6831 Body mass index (BMI) 31.0-31.9, adult: Secondary | ICD-10-CM

## 2020-10-12 DIAGNOSIS — J302 Other seasonal allergic rhinitis: Secondary | ICD-10-CM

## 2020-10-12 MED ORDER — VITAMIN D (ERGOCALCIFEROL) 1.25 MG (50000 UNIT) PO CAPS: 50000.0000 [IU] | ORAL_CAPSULE | ORAL | 0 refills | Status: AC

## 2020-10-12 NOTE — Progress Notes (Signed)
Chief Complaint:   OBESITY [name] is here to discuss her progress with her obesity treatment plan along with follow-up of her obesity related diagnoses. [name] is on the Category 1 Plan and states she is following her eating plan approximately 0% of the time. [name] states she is exercising 0 minutes 0 times per week.  Today's visit was #: 9 Starting weight: 166 lbs Starting date: 04/18/2020 Today's weight: 160 lbs Today's date: 10/12/2020 Total lbs lost to date: 6 Total lbs lost since last in-office visit: 3  Interim History: [name] is down an additional 3 lbs. She reports drinking shakes for some meals.  Subjective:   Vitamin D deficiency. No nausea, vomiting, or muscle weakness.    Ref. Range 07/28/2020 13:17  Vitamin D, 25-Hydroxy Latest Ref Range: 30.0 - 100.0 ng/mL 46.8   Chronic back pain, unspecified back location, unspecified back pain laterality. [name] is taking medications and states pain is tolerable.  Assessment/Plan:   Vitamin D deficiency. Low Vitamin D level contributes to fatigue and are associated with obesity, breast, and colon cancer. She was given a prescription for Vitamin D, Ergocalciferol, (DRISDOL) 1.25 MG (50000 UNIT) CAPS capsule every week #4 with 0 refills and will follow-up for routine testing of Vitamin D, at least 2-3 times per year to avoid over-replacement.   Chronic back pain, unspecified back location, unspecified back pain laterality. [name] will continue her medications as directed. She will gradually increase exercise and was instructed to avoid pounding exercises.  Class 1 obesity with serious comorbidity and body mass index (BMI) of 31.0 to 31.9 in adult, unspecified obesity type.  [name] is currently in the action stage of change. As such, her goal is to continue with weight loss efforts. She has agreed to the Category 1 Plan.   She will work on meal planning and intentional eating.   Exercise goals: [name] will start  walking for exercise.  Behavioral modification strategies: increasing lean protein intake, decreasing simple carbohydrates, increasing vegetables, increasing water intake, decreasing eating out, no skipping meals, meal planning and cooking strategies, keeping healthy foods in the home, ways to avoid boredom eating, ways to avoid night time snacking, better snacking choices, emotional eating strategies and planning for success.  [name] has agreed to follow-up with our clinic in 3 weeks. She was informed of the importance of frequent follow-up visits to maximize her success with intensive lifestyle modifications for her multiple health conditions.   Objective:   Blood pressure 111/78, pulse 97, temperature 98.7 F (37.1 C), height 5' (1.524 m), weight 160 lb (72.6 kg), SpO2 (!) 8 %. Body mass index is 31.25 kg/m.  General: Cooperative, alert, well developed, in no acute distress. HEENT: Conjunctivae and lids unremarkable. Cardiovascular: Regular rhythm.  Lungs: Normal work of breathing. Neurologic: No focal deficits.   Lab Results  Component Value Date   CREATININE 0.64 07/28/2020   BUN 8 07/28/2020   NA 141 07/28/2020   K 4.5 07/28/2020   CL 104 07/28/2020   CO2 22 07/28/2020   Lab Results  Component Value Date   ALT 14 07/28/2020   AST 12 07/28/2020   ALKPHOS 85 07/28/2020   BILITOT 0.6 07/28/2020   Lab Results  Component Value Date   HGBA1C 5.2 07/28/2020   HGBA1C 5.3 01/25/2020   Lab Results  Component Value Date   INSULIN 16.6 07/28/2020   INSULIN 8.4 04/18/2020   Lab Results  Component Value Date   TSH 2.790 04/18/2020   Lab  Results  Component Value Date   CHOL 161 07/28/2020   HDL 31 (L) 07/28/2020   LDLCALC 107 (H) 07/28/2020   TRIG 128 07/28/2020   CHOLHDL 4.3 01/25/2020   Lab Results  Component Value Date   WBC 9.1 01/25/2020   HGB 14.8 01/25/2020   HCT 44.6 01/25/2020   MCV 91 01/25/2020   PLT 262 01/25/2020   No results found for: IRON, TIBC,  FERRITIN  Attestation Statements:   Reviewed by clinician on day of visit: allergies, medications, problem list, medical history, surgical history, family history, social history, and previous encounter notes.  Migdalia Dk, am acting as Location manager for [employer], DO   I have reviewed the above documentation for accuracy and completeness, and I agree with the above. Jearld Lesch, DO

## 2020-10-12 NOTE — Telephone Encounter (Signed)
Requested medication (s) are due for refill today -yes  Requested medication (s) are on the active medication list -yes  Future visit scheduled -no  Last refill: 07/12/20  Notes to clinic: medication did not link to protocol- sent for review of request  Requested Prescriptions  Pending Prescriptions Disp Refills   montelukast (SINGULAIR) 10 MG tablet [Pharmacy Med Name: montelukast 10 mg tablet] 90 tablet 2    Sig: TAKE 1 TABLET BY MOUTH AT BEDTIME      There is no refill protocol information for this order        Requested Prescriptions  Pending Prescriptions Disp Refills   montelukast (SINGULAIR) 10 MG tablet [Pharmacy Med Name: montelukast 10 mg tablet] 90 tablet 2    Sig: TAKE 1 TABLET BY MOUTH AT BEDTIME      There is no refill protocol information for this order

## 2020-10-13 ENCOUNTER — Other Ambulatory Visit: Payer: Self-pay | Admitting: Family Medicine

## 2020-10-13 ENCOUNTER — Encounter (INDEPENDENT_AMBULATORY_CARE_PROVIDER_SITE_OTHER): Payer: Self-pay | Admitting: Bariatrics

## 2020-10-14 ENCOUNTER — Other Ambulatory Visit: Payer: Self-pay

## 2020-10-14 MED ORDER — COLCHICINE 0.6 MG PO CAPS: 1.0000 | ORAL_CAPSULE | Freq: Every day | ORAL | 5 refills | Status: DC

## 2020-10-20 ENCOUNTER — Other Ambulatory Visit (HOSPITAL_COMMUNITY): Payer: Self-pay | Admitting: Physical Medicine and Rehabilitation

## 2020-10-20 ENCOUNTER — Telehealth: Payer: Self-pay

## 2020-10-20 MED ORDER — HYDROCODONE-ACETAMINOPHEN 10-325 MG PO TABS
1.0000 | ORAL_TABLET | Freq: Every day | ORAL | 0 refills | Status: AC | PRN
Start: 2020-10-20 — End: ?

## 2020-10-20 NOTE — Telephone Encounter (Signed)
Sent!

## 2020-10-20 NOTE — Telephone Encounter (Signed)
[name] will be out of her Hydrocodone 10/325 MG  before her 10/28/201 follow up appointment.  She called to remind you to please send in her refill.   Thank you.

## 2020-10-21 ENCOUNTER — Ambulatory Visit: Admit: 2020-10-21 | Discharge: 2020-10-22 | Payer: PRIVATE HEALTH INSURANCE

## 2020-10-21 DIAGNOSIS — R600 Localized edema: Principal | ICD-10-CM

## 2020-10-21 DIAGNOSIS — Z720 Tobacco use: Principal | ICD-10-CM

## 2020-10-21 DIAGNOSIS — E782 Mixed hyperlipidemia: Principal | ICD-10-CM

## 2020-10-21 DIAGNOSIS — I10 Essential (primary) hypertension: Principal | ICD-10-CM

## 2020-10-21 DIAGNOSIS — E119 Type 2 diabetes mellitus without complications: Principal | ICD-10-CM

## 2020-10-21 DIAGNOSIS — R0600 Dyspnea, unspecified: Principal | ICD-10-CM

## 2020-10-21 DIAGNOSIS — M3500 Sicca syndrome, unspecified: Principal | ICD-10-CM

## 2020-10-21 NOTE — Unmapped (Signed)
DIVISION OF CARDIOLOGY  [facility], [place]        Date of Service: 10/21/2020      PCP: Referring Provider:   Laney Potash. Earlene Plater, DO  [address]  [place] [address]  Phone: [phone]  Fax: [phone] Laney Potash. Earlene Plater, DO  [address]  [place],  [place] [address]  Phone: [phone]  Fax: [phone]     ______________________________________________________________________________________________      ASSESSMENT AND PLAN:       1. Anginal pain (CMS-HCC)  - her stress test / echocardiogram were unremarkable.    - her EKG is unremarkable.  - she has typical/atypical symptoms.    - she is on Vyvanse / stimulant and that might be contributing to her symptoms.    2.  HTN - will add lisinopril to her regimen.  3.  HL -   4.  DM - on metformin.    Kim Balint, MD,  [facility], [facility]  Interventional Cardiology  Associate Professor of Medicine  [employer] at [facility]    ______________________________________________________________________________________________      SUBJECTIVE:     HISTORY OF PRESENT ILLNESS:    Dear Dr. Santina Charles L. 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      She has been having chest pain with exertion, somewhat atypical symptoms - but some of them are with exertion.  It lasts for about 5 minutes.  She smokes about a pack per day, has HTN, DM and HL.  Her mother had an MI at the age of 62 and her father recently had CABG.  She is concerned that she might have CAD.      Her stress test and echocardiogram were unremarkable.      She reports no lightheadedness, dizziness, palpitations, or syncope.  Patient also denies any overt heart failure symptoms such as orthopnea, paroxysmal nocturnal dyspnea, or worsening lower extremity edema.      Kim Charles states compliance with her medications and denies any untoward side effects from it.          CARDIOVASCULAR HISTORY AND PROCEDURES    Cardiovascular Studies Date Comments     ECG 2021 NSR   Echo 2021    Stress test 2021    Cardiac catheterization 2021    Electrophysiology      Cardiovascular Surgery     Peripheral Vascular Studies         I have reviewed the cardiology tests personally.      PAST MEDICAL HISTORY  Past Medical History:   Diagnosis Date   ??? ADHD (attention deficit hyperactivity disorder)    ??? Allergic 2007    Allergic to shrimp   ??? Anemia    ??? Asthma    ??? Chronic pain    ??? Disorder of skin or subcutaneous tissue 2001    Hidradenitis suprative   ??? Fibromyalgia    ??? Hidradenitis suppurativa    ??? Hypertension    ??? Leukemia (CMS-HCC)    ??? Lung disease 2017    COPD   ??? Lupus (systemic lupus erythematosus) (CMS-HCC)    ??? Migraines    ??? Osteoarthritis    ??? Scoliosis        ALLERGIES  Other, Shellfish containing products, Sulfasalazine, Toradol [ketorolac], Venom-honey bee, Opioids - morphine analogues, Tramadol, Hydromorphone hcl, and Naproxen      CURRENT MEDICATIONS  Current Outpatient Medications   Medication Sig Dispense Refill   ??? acyclovir (ZOVIRAX) 400 MG tablet Take 1 tablet by mouth by mouth 2 times daily for prophylaxis  6   ??? albuterol (PROVENTIL HFA;VENTOLIN HFA) 90 mcg/actuation inhaler Inhale 2 puffs daily as needed.      ??? amLODIPine (NORVASC) 5 MG tablet Take 10 mg by mouth daily.      ??? aspirin-acetaminophen-caffeine (EXCEDRIN MIGRAINE) 250-250-65 mg per tablet Take 2 tablets by mouth.     ??? BD TUBERCULIN SYRINGE 1 mL 25 gauge x 5/8 Syrg Use 1 syringe every 7 days with methotrexate 50 each 1   ??? beclomethasone (QVAR) 40 mcg/actuation inhaler Inhale 2 puffs daily as needed.      ??? cloNIDine HCl (CATAPRES) 0.1 MG tablet Take 0.1 mg by mouth Three (3) times a day.      ??? dexlansoprazole (DEXILANT) 60 mg capsule Take 60 mg by mouth.     ??? ergocalciferol-1,250 mcg, 50,000 unit, (DRISDOL) 1,250 mcg (50,000 unit) capsule Take 50,000 Units by mouth.     ??? escitalopram oxalate (LEXAPRO) 10 MG tablet Take 10 mg by mouth daily.  3   ??? fluocinonide (LIDEX) 0.05 % cream Apply 1 application topically Two (2) times a day. To rash on face. Use for up to 2 weeks at a time. Practice sun protection. 30 g 1   ??? fluticasone propionate (FLOVENT HFA) 220 mcg/actuation inhaler 2 puffs.     ??? gabapentin (NEURONTIN) 300 MG capsule Take 300 mg by mouth Three (3) times a day.     ??? HUMIRA PEN CITRATE FREE 40 MG/0.4 ML Inject the contents of 1 pen (40 mg total) under the skin every seven (7) days. 4 each 4   ??? hydroCHLOROthiazide (HYDRODIURIL) 25 MG tablet Take 200 mg by mouth.      ??? HYDROcodone-acetaminophen (NORCO 10-325) 10-325 mg per tablet Take 1 tablet by mouth 5 times daily as needed for pain     ??? hydrocortisone 2.5 % cream 1-2 times a day on the red areas on the face for 1 week straight maximum. 30 g 1   ??? hydrOXYchloroQUINE (PLAQUENIL) 200 mg tablet Take 2 tablets (400 mg total) by mouth daily. 180 tablet 3   ??? ipratropium (ATROVENT) 0.03 % nasal spray 2 sprays into each nostril.     ??? leucovorin (WELLCOVORIN) 5 mg tablet Take 3 tablets (15 mg total) by mouth daily. Take 15 mg once a week 18-24 hours after the methotrexate. 90 tablet 3   ??? metFORMIN (GLUCOPHAGE) 500 MG tablet TAKE 1 TABLET BY MOUTH 2 TIMES DAILY WITH MEALS 180 tablet 3   ??? methotrexate, Preservative Free, 25 mg/mL Soln vial/syringe inject 0.79mls under the skin once a week 12 mL 1   ??? MITIGARE 0.6 mg cap capsule Take 0.6 mg by mouth daily.     ??? montelukast (SINGULAIR) 10 mg tablet Take 10 mg by mouth nightly.     ??? promethazine (PHENERGAN) 25 MG tablet Take 1 tablet (25 mg total) by mouth every six (6) hours as needed for nausea. 30 tablet 3   ??? propranolol (INDERAL) 40 MG tablet Take 40 mg by mouth Two (2) times a day.      ??? spironolactone (ALDACTONE) 25 MG tablet Take 25 mg by mouth.     ??? topiramate (TOPAMAX) 50  MG tablet TAKE 1 TABLET BY MOUTH 2 TIMES DAILY  5   ??? traZODone (DESYREL) 50 MG tablet Take 50 mg by mouth.     ??? VYVANSE 30 mg capsule Take 30 mg by mouth two (2) times a day.   0   ??? empty container (SHARPS CONTAINER) Misc Use as directed 1 each 2   ??? EPINEPHrine (EPIPEN) 0.3 mg/0.3 mL injection Inject 0.3 mg into the muscle. (Patient not taking: Reported on 09/02/2020)     ??? HYSINGLA ER 20 mg TP24 Take 1 tablet by mouth every 24 hours (Patient not taking: Reported on 10/21/2020)     ??? lidocaine (LIDODERM) 5 % patch Apply 1 patch to skin as directed 12 hours on, 12 hours off (Patient not taking: Reported on 10/21/2020)     ??? lisinopriL (PRINIVIL,ZESTRIL) 20 MG tablet Take 1 tablet (20 mg total) by mouth daily. 30 tablet 6   ??? NARCAN 4 mg/actuation nasal spray spray (4 mg) in 1 nostril may repeat dose every 2-3 minutes as needed alternating nostrils with each dose       No current facility-administered medications for this visit.       FAMILY HISTORY  Negative for early CAD    SOCIAL HISTORY  She  reports that she has been smoking cigarettes. She has a 15.00 pack-year smoking history. She has never used smokeless tobacco. She reports that she does not drink alcohol and does not use drugs.      REVIEW OF SYSTEMS    Review of Systems - 10 systems were reviewed and negative except as noted in HPI    Constitutional: negative for - chills, fatigue, fever or night sweats  ENT ROS: negative for - vertigo or visual changes  Hematological and Lymphatic ROS: negative for - blood transfusions, jaundice, night sweats or swollen lymph nodes  Endocrine ROS: negative for - skin changes or temperature intolerance  Respiratory ROS: no cough, shortness of breath, or wheezing negative for - hemoptysis, orthopnea, shortness of breath, tachypnea or wheezing  Cardiovascular ROS:  As reported in HPI  Gastrointestinal ROS: negative for - abdominal pain, hematemesis, melena, nausea/vomiting or swallowing difficulty/pain  Genito-Urinary ROS: negative for - dysuria, incontinence or nocturia  Musculoskeletal ROS: negative for - gait disturbance, joint pain, muscle pain or muscular weakness  Neurological ROS: negative for - behavioral changes, bowel and bladder control changes, headaches, seizures or speech problems    PHYSICAL EXAM     Physical Exam  BP 129/90 (BP Site: R Arm, BP Position: Sitting)  - Pulse 87  - Resp 12  - Ht 152.4 cm (5')  - Wt 75.4 kg (166 lb 3.2 oz)  - SpO2 99%  - BMI 32.46 kg/m??    Wt Readings from Last 3 Encounters:   10/21/20 75.4 kg (166 lb 3.2 oz)   09/02/20 76.4 kg (168 lb 8 oz)   02/15/20 76.7 kg (169 lb)       General:  Alert, no distress.   Eyes:  Intact, sclerae anicteric.   Ears, nose, mouth: Moist mucous membranes.Supple, no carotid bruit.    Respiratory:   CTAB bilaterally with normal WOB.   Cardiovascular:  No carotid bruit, no JVD, RRR no murmurs rubs or gallops   Gastrointestinal:   Normal bowel sounds, soft, NTND.   Musculoskeletal: Normal strength   Skin: Warm, well perfused.   Neurologic: No focal deficits.       Most recent labs   Lab Results   Component Value  Date    Sodium 136 01/14/2019    Potassium 4.4 01/14/2019    Chloride 101 01/14/2019    CO2 24.0 01/14/2019    BUN 13 01/14/2019    Creatinine 0.60 06/27/2020     Lab Results   Component Value Date    HGB 13.9 06/27/2020    MCV 90.9 06/27/2020    Platelet 243 06/27/2020     Lab Results   Component Value Date    Cholesterol 179 03/29/2017    Triglycerides 115 03/29/2017    HDL 36 (L) 03/29/2017    Non-HDL Cholesterol 143 03/29/2017    LDL Calculated 120 (H) 03/29/2017    TSH 1.600 10/30/2016    PRO-BNP 90.3 04/27/2019       Lab Results   Component Value Date    CHOL 179 03/29/2017     Lab Results   Component Value Date    HDL 36 (L) 03/29/2017     Lab Results   Component Value Date    LDL 120 (H) 03/29/2017     Lab Results   Component Value Date    VLDL 23 03/29/2017     Lab Results   Component Value Date    CHOLHDLRATIO 5.0 (H) 03/29/2017     Lab Results Component Value Date    TRIG 115 03/29/2017         *Patient note was created using dragon dictation. Any errors in syntax or proofreading may not have been identified and edited on initial review prior to signing this note.    Thank you very much for allowing me the opportunity to participate in the care of Kim Charles, who is a delightful patient.  Please do not hesitate to call me if you have any questions.      Sincerely,    Kim Balint, MD, [facility], [facility]  Interventional Cardiology  Associate Professor of Medicine  [employer] at [facility]

## 2020-10-21 NOTE — Unmapped (Signed)
Stress test and echocardiogram were both negative.

## 2020-10-27 ENCOUNTER — Telehealth: Payer: Self-pay

## 2020-10-27 ENCOUNTER — Encounter: Payer: Self-pay | Admitting: Physical Medicine and Rehabilitation

## 2020-10-27 ENCOUNTER — Other Ambulatory Visit: Payer: Self-pay

## 2020-10-27 ENCOUNTER — Other Ambulatory Visit (INDEPENDENT_AMBULATORY_CARE_PROVIDER_SITE_OTHER): Payer: Self-pay | Admitting: Bariatrics

## 2020-10-27 ENCOUNTER — Encounter
Payer: Medicaid Other | Attending: Physical Medicine and Rehabilitation | Admitting: Physical Medicine and Rehabilitation

## 2020-10-27 ENCOUNTER — Other Ambulatory Visit: Payer: Self-pay | Admitting: Physical Medicine and Rehabilitation

## 2020-10-27 VITALS — BP 145/105 | HR 115 | Temp 99.0°F | Ht 61.0 in | Wt 167.4 lb

## 2020-10-27 DIAGNOSIS — G8929 Other chronic pain: Secondary | ICD-10-CM | POA: Diagnosis present

## 2020-10-27 DIAGNOSIS — E559 Vitamin D deficiency, unspecified: Secondary | ICD-10-CM

## 2020-10-27 DIAGNOSIS — M25512 Pain in left shoulder: Secondary | ICD-10-CM | POA: Diagnosis present

## 2020-10-27 DIAGNOSIS — M329 Systemic lupus erythematosus, unspecified: Secondary | ICD-10-CM | POA: Insufficient documentation

## 2020-10-27 DIAGNOSIS — M5441 Lumbago with sciatica, right side: Secondary | ICD-10-CM | POA: Insufficient documentation

## 2020-10-27 DIAGNOSIS — M7918 Myalgia, other site: Secondary | ICD-10-CM | POA: Insufficient documentation

## 2020-10-27 DIAGNOSIS — M7061 Trochanteric bursitis, right hip: Secondary | ICD-10-CM | POA: Insufficient documentation

## 2020-10-27 DIAGNOSIS — M25511 Pain in right shoulder: Secondary | ICD-10-CM | POA: Diagnosis present

## 2020-10-27 DIAGNOSIS — M5136 Other intervertebral disc degeneration, lumbar region: Secondary | ICD-10-CM | POA: Insufficient documentation

## 2020-10-27 DIAGNOSIS — M797 Fibromyalgia: Secondary | ICD-10-CM | POA: Diagnosis present

## 2020-10-27 DIAGNOSIS — M5442 Lumbago with sciatica, left side: Secondary | ICD-10-CM | POA: Diagnosis present

## 2020-10-27 DIAGNOSIS — G894 Chronic pain syndrome: Secondary | ICD-10-CM | POA: Diagnosis present

## 2020-10-27 DIAGNOSIS — M359 Systemic involvement of connective tissue, unspecified: Principal | ICD-10-CM

## 2020-10-27 DIAGNOSIS — L732 Hidradenitis suppurativa: Principal | ICD-10-CM

## 2020-10-27 DIAGNOSIS — Z79899 Other long term (current) drug therapy: Principal | ICD-10-CM

## 2020-10-27 MED ORDER — COLCHICINE 0.6 MG PO CAPS: 1.0000 | ORAL_CAPSULE | Freq: Every day | ORAL | 5 refills | Status: AC

## 2020-10-27 MED ORDER — HUMIRA PEN CITRATE FREE 40 MG/0.4 ML
SUBCUTANEOUS | 4 refills | 28 days
Start: 2020-10-27 — End: ?

## 2020-10-27 NOTE — Progress Notes (Signed)
Subjective:    Patient ID: [name], female    DOB: Feb 06, 1981, 39 y.o.   MRN: [id number]  HPI  Mrs. Dalporto is a 39 year old woman who presents for follow-up of her diffuse joint pains secondary to her rheumatoid arthritis and fibromyalgia.   Right hip and shoulder pain have been exacerbated by her lupus flare.   Daughter is being bullied at school and cops were at her apartment yesterday about this. She has been very stressed about this and she thinks this and the weather may have triggered her lupus flare.   She would be interested in trigger point injections for her right hip and shoulder pain. Can not to this visit given her fever (likely from lupus flare) but can do next visit.   She has been taking medication as prescribed but feels it is not lasting until the next dose and she asks about longer-release options. Discussed Belbuca film as the safest option and she would like to read more about this before deciding.   Prior history: She has been having more pain recently due to stress from her daughter cutting herself and finding alcohol in her daughter's room. Her daughter is a very smart girl and she is scared that she if going off path. She has discussed this with her daughter and feels she is listening to her. She is seeking behavioral health counseling. This has caused her a lot of stress and resulted in lupus flare, worsening her joint pain. She maintains up to 5 10mg  Norcos per day. She has not tried voltaren gel. The Gabapentin dose help her- she takes 300mg  TID.   She has been sleeping poorly.  Continues to try to exercise.   Pain Inventory Average Pain 6 Pain Right Now 6 My pain is constant, sharp, burning, dull, stabbing and aching  In the last 24 hours, has pain interfered with the following? General activity 10 Relation with others 0 Enjoyment of life 10 What TIME of day is your pain at its worst? morning , evening and night Sleep (in general) Poor  Pain is  worse with: bending, sitting, standing and some activites Pain improves with: rest, heat/ice and medication Relief from Meds: 5  Family History  Problem Relation Age of Onset  . Heart disease Mother   . Diabetes Mother   . Hypertension Mother   . High Cholesterol Mother   . Thyroid disease Mother   . Depression Mother   . Obesity Mother   . Heart disease Father   . Diabetes Father   . Hypertension Father   . High Cholesterol Father   . Alcoholism Father   . Bipolar disorder Brother   . Cancer Brother        prostate  . Pancreatic cancer Maternal Grandmother   . Cancer Maternal Grandmother        pancreatic  . Cancer Maternal Uncle        lung  . Cancer Paternal Aunt   . Bipolar disorder Sister    Social History   Socioeconomic History  . Marital status: Married    Spouse name: Larkin Ina  . Number of children: Not on file  . Years of education: Not on file  . Highest education level: Not on file  Occupational History  . Occupation: stay at home mom  Tobacco Use  . Smoking status: Current Every Day Smoker    Packs/day: 1.00    Years: 11.00    Pack years: 11.00  Types: Cigarettes  . Smokeless tobacco: Never Used  . Tobacco comment: too much stress  Vaping Use  . Vaping Use: Never used  Substance and Sexual Activity  . Alcohol use: No  . Drug use: No  . Sexual activity: Yes    Birth control/protection: Surgical  Other Topics Concern  . Not on file  Social History Narrative   At [employer] wanting to get apply to nursing school.  Plans to apply 2/13.     Social Determinants of Health   Financial Resource Strain:   . Difficulty of Paying Living Expenses: Not on file  Food Insecurity:   . Worried About Charity fundraiser in the Last Year: Not on file  . Ran Out of Food in the Last Year: Not on file  Transportation Needs:   . Lack of Transportation (Medical): Not on file  . Lack of Transportation (Non-Medical): Not on file  Physical Activity:   . Days of  Exercise per Week: Not on file  . Minutes of Exercise per Session: Not on file  Stress:   . Feeling of Stress : Not on file  Social Connections:   . Frequency of Communication with Friends and Family: Not on file  . Frequency of Social Gatherings with Friends and Family: Not on file  . Attends Religious Services: Not on file  . Active Member of Clubs or Organizations: Not on file  . Attends Archivist Meetings: Not on file  . Marital Status: Not on file   Past Surgical History:  Procedure Laterality Date  . ABDOMINAL HYSTERECTOMY  06/30/2008   partial  . AXILLARY HIDRADENITIS EXCISION     bilateral  . CESAREAN SECTION  08/31/2006, 08/26/2007  . LAPAROSCOPIC ENDOMETRIOSIS FULGURATION  06/1999  . MASS EXCISION  06/19/2012   Procedure: EXCISION MASS;  Surgeon: Merrie Roof, MD;  Location: [facility];  Service: General;  Laterality: Right;  excision mass right abdominal wall   Past Surgical History:  Procedure Laterality Date  . ABDOMINAL HYSTERECTOMY  06/30/2008   partial  . AXILLARY HIDRADENITIS EXCISION     bilateral  . CESAREAN SECTION  08/31/2006, 08/26/2007  . LAPAROSCOPIC ENDOMETRIOSIS FULGURATION  06/1999  . MASS EXCISION  06/19/2012   Procedure: EXCISION MASS;  Surgeon: Merrie Roof, MD;  Location: [facility];  Service: General;  Laterality: Right;  excision mass right abdominal wall   Past Medical History:  Diagnosis Date  . Abdominal wall mass   . ADHD (attention deficit hyperactivity disorder)   . Anemia   . Arthritis   . Asthma   . Back pain   . Chest pain   . Depression    tried several meds, not on any now  . Diarrhea   . Endometriosis   . Headache(784.0)   . Hydradenitis   . Hydradenitis 07/13/2011  . Hypertension    on inderal  . Leukemia ([place]) at age 37  . Lupus ([place])   . Migraines   . MRSA (methicillin resistant staph aureus) culture positive 2010   ear infection that cultured out MRSA   . Nausea   . S/P total  abdominal hysterectomy 06/26/2011  . Scoliosis   . Thyroid disease    There were no vitals taken for this visit.  Opioid Risk Score:   Fall Risk Score:  `1  Depression screen PHQ 2/9  Depression screen [facility] 2/9 09/22/2020 05/11/2020 04/18/2020 03/18/2020 05/06/2012  Decreased Interest 0 3 3 1  1  Down, Depressed, Hopeless 0 0 3 0 1  PHQ - 2 Score 0 3 6 1 2   Altered sleeping - 3 3 1  -  Tired, decreased energy - 3 3 3  -  Change in appetite - 0 3 0 -  Feeling bad or failure about yourself  - 0 3 1 -  Trouble concentrating - 3 3 1  -  Moving slowly or fidgety/restless - 0 0 0 -  Suicidal thoughts - 0 0 0 -  PHQ-9 Score - 12 21 7  -  Difficult doing work/chores - - Somewhat difficult - -    Review of Systems  Musculoskeletal: Positive for back pain and joint swelling.       Leg pain Hip pain Knee pain Shoulder pain Elbow pain   Neurological: Positive for numbness.  All other systems reviewed and are negative.      Objective:   Physical Exam Gen: no distress, normal appearing HEENT: oral mucosa pink and moist, NCAT Cardio: Reg rate Chest: normal effort, normal rate of breathing Abd: soft, non-distended Ext: no edema Skin: malar rash Neuro: Alert and oriented x3.  Musculoskeletal: 5/5 strength throughout. Diffuse tenderness to palpation in muscles and joints of upper and lower extremities, most prominently in right shoulder and hip.  Psych: pleasant, normal affect     Assessment & Plan:  Mrs. Pontarelli is a 39 year old woman who presents with diffuse joint pain. She has a history of fibromyalgia, Lupus, scoliosis, Sjogren's Disease, and lumbar disc disease.   Fibromyalgia There is presence of widespread pain above and below the waist on both sides for more than 3 months that is consistent with fibromyalgia. There are related symptoms of fatigue and cognitive dysfunction.  Recommended30 minutes of aerobic exercise 5 times per week and resistance exercise of all major muscle  groups at least twice per week. Educated patient that this is a crucial component of recovery and that self-responsibility is key.She is already very good about going to [employer] with her daughter 5x per week which is excellent!  Continue phsychology visits which appear to be greatly helping her.   Lumbar disc degeneration: -Appears to be a component of neuropathic pain in both legs, no evidence of radiculopathy or stenosis on 2016 MRI.Ordered lumbar XR to assess for stenosis since it has been 5 years since last imaging.It showed no acute findings with mild convex rightward lumbar scoliosis- discussed with patient. -Prescribed physical therapy for core and paraspinal stretching and strengthening. -Pain contract and UDS signedpreviously. -Refilled Norco 10mg  with 325mg  Tylenol up to 5 times per day as needed.Has been helping to control her pain and she has been taking as prescribed. PDMP reviewed and patient is not receiving this medication from any other providers. The medication is currently not lasting until the next dose and she asks about longer release options. I discussed Belbuca buccal films as the safest of these options. Discussed risks and benefits of this medication with her. She will research more about this option before deciding if she wants to switch from [place] to this.  -Continue Gabapentin to 300/300/600- will help with pain and insomnia.  -Prescribed diclofenac gel for her joints- she says this has not helped greatly.  -Routine UDSs have been administered and show expected metabolites.  Right hip and shoulder myofascial pain: -Trigger point injections next visit.    Quality of Life: -Continueto encourage return to work, which is one of patient's goals.She enjoyed her time at beach with her family. She is proud  of her children's success at their school. She plans to go to a concert with her husband and asks whether she should reschedule due to [place]. She has received  both vaccines. I advised that this does decrease her chances of acquiring the Delta variant, though not completely.  -Discussed her stress with her daughter and provided positive reinforcement that she is doing the right thing.   Hypotension: -Decreased amlodipine to 5mg  previously. Provided script. Log pressures and bring to f/u appointment. Amlodipine may also be contributing to leg swelling. Hopefully with more exercise and better nutrition we can get her off this medication. Current BP is 145/105- she states because of her current pain and lupus flare.   Vitamin D deficiency: completed high dose treatment, Advised that deficiency can also contribute to pain.  Bilateral shoulder pain: Discussed results of shoulder XRs with patient- both were stable.   Leg swelling: Advised ice, compression garments, elevation.Continue spironolactone. Prescribed Lasix needed. Bmp obtained to check creatinine. Result was normal: discussed with patient.   Obesity: Advised low carb diet. Patient eager to follow and will involve her husband who is very supportive. Lost 1 lbs over the past 2 months. Currently 167 lbs. Continue to monitor each visit.   All questions answered. RTC in 1 month.

## 2020-10-27 NOTE — Telephone Encounter (Signed)
1) Medication(s) Requested (by name):  Colchicine (MITIGARE) 0.6 MG CAPS   2) Pharmacy of Choice:   [facility] [place] (Pharmacy) [phone]  3) Special Requests: n/a  Refill originally sent to [facility] on 10/15: pharmacy mentioned they have not received the request.   Approved medications will be sent to the pharmacy, we will reach out if there is an issue.  Requests made after 3pm may not be addressed until the following business day!  If a patient is unsure of the name of the medication(s) please note and ask patient to call back when they are able to provide all info, do not send to responsible party until all information is available!

## 2020-10-27 NOTE — Patient Instructions (Signed)
Belbuca

## 2020-10-27 NOTE — Addendum Note (Signed)
Addended by: Carylon Perches on: 10/27/2020 02:42 PM   Modules accepted: Orders

## 2020-11-01 MED ORDER — LEUCOVORIN CALCIUM 5 MG TABLET
ORAL_TABLET | Freq: Every day | ORAL | 3 refills | 30.00000 days | Status: CP
Start: 2020-11-01 — End: 2021-02-07

## 2020-11-01 NOTE — Unmapped (Signed)
Leflunomide refill  Last ov: 06/23/2020  Next ov: 02/09/2021     Labs:   AST   Date Value Ref Range Status   06/27/2020 16 <34 U/L Final     ALT   Date Value Ref Range Status   06/27/2020 11 10 - 49 U/L Final     Creatinine   Date Value Ref Range Status   06/27/2020 0.60 0.50 - 0.80 mg/dL Final     WBC   Date Value Ref Range Status   06/27/2020 10.6 (H) 3.5 - 10.5 10*9/L Final     HGB   Date Value Ref Range Status   06/27/2020 13.9 12.0 - 15.5 g/dL Final     HCT   Date Value Ref Range Status   06/27/2020 41.7 35.0 - 44.0 % Final     MCV   Date Value Ref Range Status   06/27/2020 90.9 82.0 - 98.0 fL Final     RDW   Date Value Ref Range Status   06/27/2020 14.1 12.0 - 15.0 % Final     Platelet   Date Value Ref Range Status   06/27/2020 243 150 - 450 10*9/L Final     Neutrophils %   Date Value Ref Range Status   06/27/2020 52.5 % Final     Lymphocytes %   Date Value Ref Range Status   06/27/2020 39.6 % Final     Monocytes %   Date Value Ref Range Status   06/27/2020 5.3 % Final     Eosinophils %   Date Value Ref Range Status   06/27/2020 1.4 % Final     Basophils %   Date Value Ref Range Status   06/27/2020 1.2 % Final

## 2020-11-03 ENCOUNTER — Ambulatory Visit (INDEPENDENT_AMBULATORY_CARE_PROVIDER_SITE_OTHER): Payer: Medicaid Other | Admitting: Bariatrics

## 2020-11-07 DIAGNOSIS — L732 Hidradenitis suppurativa: Principal | ICD-10-CM

## 2020-11-07 MED ORDER — HUMIRA PEN CITRATE FREE 40 MG/0.4 ML
SUBCUTANEOUS | 4 refills | 28.00000 days | Status: CP
Start: 2020-11-07 — End: 2020-11-16

## 2020-11-07 NOTE — Unmapped (Signed)
Message from patient requesting a refill for Humira.    Patient last seen 06/08/2020.  Refill pended if appropriate.

## 2020-11-09 ENCOUNTER — Ambulatory Visit (INDEPENDENT_AMBULATORY_CARE_PROVIDER_SITE_OTHER): Payer: Medicaid Other | Admitting: Bariatrics

## 2020-11-15 ENCOUNTER — Ambulatory Visit (INDEPENDENT_AMBULATORY_CARE_PROVIDER_SITE_OTHER): Payer: Medicaid Other | Admitting: Bariatrics

## 2020-11-16 DIAGNOSIS — L732 Hidradenitis suppurativa: Principal | ICD-10-CM

## 2020-11-16 MED ORDER — HUMIRA PEN CITRATE FREE 40 MG/0.4 ML
SUBCUTANEOUS | 4 refills | 28.00000 days | Status: CP
Start: 2020-11-16 — End: ?
  Filled 2021-01-17: qty 4, 28d supply, fill #0

## 2020-11-16 NOTE — Unmapped (Signed)
Call from [facility] in [place] asking about directions for Humira.  They have never filled this medication for this patient.  Rx needs to be sent to [facility] instead since this is where Humira has been sent before and they delivered medication to patient.

## 2020-11-30 ENCOUNTER — Encounter: Payer: Medicaid Other | Admitting: Physical Medicine and Rehabilitation

## 2020-12-01 ENCOUNTER — Other Ambulatory Visit: Payer: Self-pay | Admitting: Physical Medicine and Rehabilitation

## 2020-12-01 DIAGNOSIS — I10 Essential (primary) hypertension: Secondary | ICD-10-CM

## 2020-12-01 DIAGNOSIS — M359 Systemic involvement of connective tissue, unspecified: Principal | ICD-10-CM

## 2020-12-02 MED ORDER — METHOTREXATE SODIUM (PF) 25 MG/ML INJECTION SOLUTION
1 refills | 0 days
Start: 2020-12-02 — End: ?

## 2021-01-13 NOTE — Unmapped (Signed)
[facility] Refill Coordination Note    Specialty Medication(s) to be Shipped:   Inflammatory Disorders: Humira    Other medication(s) to be shipped: No additional medications requested for fill at this time     Kim Charles, DOB: [birth year]  Phone: There are no phone numbers on file.      All above HIPAA information was verified with patient.     Was a Nurse, learning disability used for this call? No    Completed refill call assessment today to schedule patient's medication shipment from the [facility] [phone]).       Specialty medication(s) and dose(s) confirmed: Regimen is correct and unchanged.   Changes to medications: Kim Charles reports no changes at this time.  Changes to insurance: No  Questions for the pharmacist: Yes: transferred to [facility]    Confirmed patient received Welcome Packet with first shipment. The patient will receive a drug information handout for each medication shipped and additional FDA Medication Guides as required.       DISEASE/MEDICATION-SPECIFIC INFORMATION        For patients on injectable medications: Patient currently has 0 doses left.  Next injection is scheduled for ASAP.    SPECIALTY MEDICATION ADHERENCE     Medication Adherence    Patient reported X missed doses in the last month: >5  Specialty Medication: Humira CF 40 mg/0.4 ml  Patient is on additional specialty medications: No          SHIPPING     Shipping address confirmed in Epic.     Delivery Scheduled: Yes, Expected medication delivery date: 01/18/2021.     Medication will be delivered via UPS to the prescription address in Epic WAM.    Kim Charles [facility] Specialty Technician

## 2021-01-13 NOTE — Unmapped (Signed)
[facility] Pharmacist Intervention    Type of intervention: coordination of care/disease management    Medication: Humira    Problem: Odelia Gage has been off therapy since early November and has started to flare. She questioned if she needed to re-load.       Intervention: I advised she should reach out to clinic to see about a potential re-load. She's a little shy of where we usually see a re-load (off therapy 3-4 months, she's been off 2.5), but each case may be different. I advised she could resume regular maintenance dosing of once weekly, and then if her HS doesn't respond, we could always get a re-load prescription for later.    Follow up needed: na/patient to follow up with clinic. If new Rx for load/different directions is received, we'll reach out to patient.    Approximate time spent: 10 minutes    Maclean Foister A Desiree Lucy [facility] Specialty Pharmacist

## 2021-01-14 NOTE — Unmapped (Signed)
Patient called stating she has not been on Humira since October and wanted to know if she needs to do the starter dose or just continue the maintenance dose.

## 2021-01-17 MED FILL — EMPTY CONTAINER: 120 days supply | Qty: 1 | Fill #1

## 2021-01-17 NOTE — Unmapped (Signed)
Returned patients call for return HS with Kim Charles; no answer left message

## 2021-02-06 NOTE — Unmapped (Signed)
Patient is calling about a new prescription for methotrexate. She recently moved and needs mediation sent to new pharmacy. Would like a call back.

## 2021-02-07 DIAGNOSIS — M3500 Sicca syndrome, unspecified: Principal | ICD-10-CM

## 2021-02-07 DIAGNOSIS — M359 Systemic involvement of connective tissue, unspecified: Principal | ICD-10-CM

## 2021-02-07 DIAGNOSIS — Z79899 Other long term (current) drug therapy: Principal | ICD-10-CM

## 2021-02-07 MED ORDER — METHOTREXATE SODIUM 25 MG/ML INJECTION SOLUTION
SUBCUTANEOUS | 3 refills | 112.00000 days | Status: CP
Start: 2021-02-07 — End: ?

## 2021-02-07 MED ORDER — BD TUBERCULIN SYRINGE 1 ML 25 GAUGE X 5/8"
7 refills | 0.00000 days | Status: CP
Start: 2021-02-07 — End: ?

## 2021-02-07 MED ORDER — LEUCOVORIN CALCIUM 5 MG TABLET
ORAL_TABLET | Freq: Every day | ORAL | 3 refills | 30.00000 days | Status: CP
Start: 2021-02-07 — End: ?

## 2021-02-07 NOTE — Unmapped (Signed)
Called and spoke to patient who states that she is awaiting biopsy results. Following up on 2/10.

## 2021-02-08 NOTE — Unmapped (Signed)
Returned patient's phone call.  She states she was off Humira from October till about two weeks ago. She reports she was waiting for prescription from her Dermatologist. She reports after taking Humira she developed lumps over her breasts and had biopsied done which she is waiting for results.     In the meantime, she has missed one dose of methotrexate 15 mg subcutaneous injection. Prescription ran out. She has also moved and has a new pharmacy.     Medications refilled. She has an appointment in two days. Will discuss additional concerns at that time. She appreciated phone call.

## 2021-02-09 ENCOUNTER — Ambulatory Visit: Admit: 2021-02-09 | Discharge: 2021-02-09 | Payer: MEDICAID

## 2021-02-09 DIAGNOSIS — M359 Systemic involvement of connective tissue, unspecified: Principal | ICD-10-CM

## 2021-02-09 DIAGNOSIS — Z79899 Other long term (current) drug therapy: Principal | ICD-10-CM

## 2021-02-09 DIAGNOSIS — L732 Hidradenitis suppurativa: Principal | ICD-10-CM

## 2021-02-09 DIAGNOSIS — U099 Post covid-19 condition, unspecified: Principal | ICD-10-CM

## 2021-02-09 DIAGNOSIS — Z23 Encounter for immunization: Principal | ICD-10-CM

## 2021-02-09 DIAGNOSIS — R0600 Dyspnea, unspecified: Principal | ICD-10-CM

## 2021-02-09 LAB — CREATININE
CREATININE: 0.66 mg/dL
EGFR CKD-EPI AA FEMALE: >90 mL/min/{1.73_m2} (ref >=60)
EGFR CKD-EPI NON-AA FEMALE: >90 mL/min/{1.73_m2} (ref >=60)

## 2021-02-09 LAB — URINALYSIS WITH CULTURE REFLEX
BILIRUBIN UA: NEGATIVE
BLOOD UA: NEGATIVE
GLUCOSE UA: NEGATIVE
KETONES UA: NEGATIVE
LEUKOCYTE ESTERASE UA: NEGATIVE
NITRITE UA: NEGATIVE
PH UA: 8.5 (ref 5.0–9.0)
RBC UA: <1 /HPF (ref 0–3)
SPECIFIC GRAVITY UA: 1.025 (ref 1.005–1.030)
SQUAMOUS EPITHELIAL: 4 /HPF (ref 0–5)
UROBILINOGEN UA: 1
WBC UA: 7 /HPF — ABNORMAL HIGH (ref 0–3)

## 2021-02-09 LAB — CBC W/ AUTO DIFF
BASOPHILS ABSOLUTE COUNT: 0.1 10*9/L (ref 0.0–0.1)
BASOPHILS RELATIVE PERCENT: 1 %
EOSINOPHILS ABSOLUTE COUNT: 0.2 10*9/L (ref 0.0–0.7)
EOSINOPHILS RELATIVE PERCENT: 1.2 %
HEMATOCRIT: 42.6 % (ref 35.0–44.0)
HEMOGLOBIN: 13.7 g/dL (ref 12.0–15.5)
LYMPHOCYTES ABSOLUTE COUNT: 2.5 10*9/L (ref 0.7–4.0)
LYMPHOCYTES RELATIVE PERCENT: 18.4 %
MEAN CORPUSCULAR HEMOGLOBIN CONC: 32.2 g/dL (ref 30.0–36.0)
MEAN CORPUSCULAR HEMOGLOBIN: 29.3 pg (ref 26.0–34.0)
MEAN CORPUSCULAR VOLUME: 91 fL (ref 82.0–98.0)
MEAN PLATELET VOLUME: 8.8 fL (ref 7.0–10.0)
MONOCYTES ABSOLUTE COUNT: 0.8 10*9/L (ref 0.1–1.0)
MONOCYTES RELATIVE PERCENT: 5.6 %
NEUTROPHILS ABSOLUTE COUNT: 9.9 10*9/L — ABNORMAL HIGH (ref 1.7–7.7)
NEUTROPHILS RELATIVE PERCENT: 73.8 %
PLATELET COUNT: 286 10*9/L (ref 150–450)
RED BLOOD CELL COUNT: 4.68 10*12/L (ref 3.90–5.03)
RED CELL DISTRIBUTION WIDTH: 13.3 % (ref 12.0–15.0)
WBC ADJUSTED: 13.4 10*9/L — ABNORMAL HIGH (ref 3.5–10.5)

## 2021-02-09 LAB — PROTEIN / CREATININE RATIO, URINE
CREATININE, URINE: 100.7 mg/dL
PROTEIN URINE: 42.9 mg/dL
PROTEIN/CREAT RATIO, URINE: 0.426

## 2021-02-09 LAB — AST: AST (SGOT): 14 U/L (ref <=34)

## 2021-02-09 LAB — C3 COMPLEMENT: C3 COMPLEMENT: 78 mg/dL — ABNORMAL LOW (ref 90–170)

## 2021-02-09 LAB — C4 COMPLEMENT: C4 COMPLEMENT: 9.5 mg/dL — ABNORMAL LOW (ref 12.0–36.0)

## 2021-02-09 LAB — ALT: ALT (SGPT): 7 U/L — ABNORMAL LOW (ref 10–49)

## 2021-02-09 NOTE — Unmapped (Signed)
Patient Name: Kim Charles  PCP: ??Catherine L. 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, +SSA, photosensitivity, raynaud's, fatigue, weight loss, episodic generalized edema and polyarthralgia with swelling of both large and small joints, recurrent pneumonitis/pleuritis, malar rash, generalized weakness that is worse proximally, nausea/vomiting/abdominal bloating, mild sicca symptoms, oral ulcerations, history of recurrent miscarriages (no VTE, minimally elevated aCL IgG/IgM, neg LAC and neg B2GP1 Ab) and migraines.??She is being treated with HCQ.????MTX added in 12/2018 due to concerns for inflammatory arthritis.??Additional PMH of childhood leukemia, hidradenitis suppurativa, asthma, OSA, fibromyalgia. For her hidradenitis, humira was started in 04/2019.    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  Since being on Humira, she is now dsdDNA positive. In the past primarily, SSA positive with hypocomplementemia.     HPI: Kim Charles is a 40 y.o. female who presents for her follow-up for UCTD and hidradenitis on immunosuppressive regimen. She was last evaluated by me in June 2021 via video visit. She returns today for an in person evaluation.      Today, she confirms her medication regimen is: hydroxychloroquine 400 mg daily, methotrexate 15 mg subcutaneous injection, leucovorin 15 mg once a week following methotrexate, and Humira weekly up until recently. She has been off methotrexate for two weeks as of tomorrow but does have a prescription ready at pharmacy. She has also been off Humira since October 2021 and when she took it for first time in three months two weeks she developed abscesses. She is still on antibiotics with recent change from clindamycin to doxycycline. She had COVID-19 a month ago and has had poor appetite since. She notes dyspnea at night following COVID-19 infection.  She did not receive any therapy for COVID-19. She has not had the booster but would like to receive today if available.  She reports her mother spread COVID-19 following a cruise to the whole family. Her mother was hospitalized with COVID-19 pneumonia. She has been feeling unwell and feels like she is having a flare. She reports joints are red and face is swollen.  Discussed given antibiotics, should avoid prednisone taper at this time. She is on doxycycline for an additional 9-10 days.     ROS??: Attests to the above, otherwise, review of all medications.   ??  Past Medical and Surgical History:  ??  Patient Active Problem List    Diagnosis Date Noted   ??? Connective tissue disease, undifferentiated (CMS-HCC) 05/14/2018   ??? Fibromyalgia 05/14/2018   ??? Hidradenitis suppurativa 05/14/2018   ??? Tobacco abuse 01/21/2014     Past Surgical History:   Procedure Laterality Date   ??? CESAREAN SECTION     ??? LAPAROSCOPIC ENDOMETRIOSIS FULGURATION     ??? PARTIAL HYSTERECTOMY       Allergies:   ??  Allergies   Allergen Reactions   ??? Other Hives, Itching and Other (See Comments)     High fever. Hives and itching all over the body.   ??? Shellfish Containing Products Anaphylaxis   ??? Sulfasalazine Hives   ??? Toradol [Ketorolac] Hives   ??? Venom-Honey Bee Anaphylaxis   ??? Opioids - Morphine Analogues      Other reaction(s): Other  Other reaction(s): Other (See Comments)  Causes migraine  Headache   ???  Tramadol Nausea And Vomiting   ??? Hydromorphone Hcl      migraine   ??? Naproxen      Grits teeth     Current Outpatient Medications:  ??  Current Outpatient Medications on File Prior to Visit   Medication Sig Dispense Refill   ??? acyclovir (ZOVIRAX) 400 MG tablet Take 1 tablet by mouth by mouth 2 times daily for prophylaxis  6   ??? albuterol (PROVENTIL HFA;VENTOLIN HFA) 90 mcg/actuation inhaler Inhale 2 puffs daily as needed.      ??? amLODIPine (NORVASC) 5 MG tablet Take 10 mg by mouth daily.      ??? aspirin-acetaminophen-caffeine (EXCEDRIN MIGRAINE) 250-250-65 mg per tablet Take 2 tablets by mouth.     ??? beclomethasone (QVAR) 40 mcg/actuation inhaler Inhale 2 puffs daily as needed.      ??? cloNIDine HCl (CATAPRES) 0.1 MG tablet Take 0.1 mg by mouth Three (3) times a day.      ??? dexlansoprazole (DEXILANT) 60 mg capsule Take 60 mg by mouth.     ??? empty container (SHARPS CONTAINER) Misc Use as directed 1 each 2   ??? ergocalciferol-1,250 mcg, 50,000 unit, (DRISDOL) 1,250 mcg (50,000 unit) capsule Take 50,000 Units by mouth.     ??? escitalopram oxalate (LEXAPRO) 10 MG tablet Take 10 mg by mouth daily.  3   ??? fluocinonide (LIDEX) 0.05 % cream Apply 1 application topically Two (2) times a day. To rash on face. Use for up to 2 weeks at a time. Practice sun protection. 30 g 1   ??? fluticasone propionate (FLOVENT HFA) 220 mcg/actuation inhaler 2 puffs.     ??? gabapentin (NEURONTIN) 300 MG capsule Take 300 mg by mouth Three (3) times a day.     ??? HUMIRA PEN CITRATE FREE 40 MG/0.4 ML Inject the contents of 1 pen (40 mg total) under the skin every seven (7) days. 4 each 4   ??? hydroCHLOROthiazide (HYDRODIURIL) 25 MG tablet Take 200 mg by mouth.      ??? HYDROcodone-acetaminophen (NORCO 10-325) 10-325 mg per tablet Take 1 tablet by mouth 5 times daily as needed for pain     ??? hydrocortisone 2.5 % cream 1-2 times a day on the red areas on the face for 1 week straight maximum. 30 g 1   ??? hydrOXYchloroQUINE (PLAQUENIL) 200 mg tablet Take 2 tablets (400 mg total) by mouth daily. 180 tablet 3   ??? ipratropium (ATROVENT) 0.03 % nasal spray 2 sprays into each nostril.     ??? leucovorin (WELLCOVORIN) 5 mg tablet Take 3 tablets (15 mg total) by mouth daily. Take 15 mg once a week 18-24 hours after the methotrexate. 90 tablet 3   ??? metFORMIN (GLUCOPHAGE) 500 MG tablet TAKE 1 TABLET BY MOUTH 2 TIMES DAILY WITH MEALS 180 tablet 3   ??? methotrexate 25 mg/mL injection solution Inject 0.6 mL (15 mg total) under the skin once a week. 10 mL 3   ??? methotrexate, Preservative Free, 25 mg/mL Soln vial/syringe inject 0.55mls under the skin once a week 12 mL 1   ??? MITIGARE 0.6 mg cap capsule Take 0.6 mg by mouth daily.     ??? montelukast (SINGULAIR) 10 mg tablet Take 10 mg by mouth nightly.     ??? promethazine (PHENERGAN) 25 MG tablet Take 1 tablet (25 mg total) by mouth every six (6) hours as needed for nausea. 30 tablet 3   ??? propranolol (INDERAL) 40 MG tablet Take 40 mg by mouth Two (  2) times a day.      ??? spironolactone (ALDACTONE) 25 MG tablet Take 25 mg by mouth.     ??? syringe with needle (BD TUBERCULIN SYRINGE) 1 mL 25 gauge x 5/8 Syrg Use 1 syringe once a week for methotrexate injections. 50 each 7   ??? topiramate (TOPAMAX) 50 MG tablet TAKE 1 TABLET BY MOUTH 2 TIMES DAILY  5   ??? traZODone (DESYREL) 50 MG tablet Take 50 mg by mouth.     ??? VYVANSE 30 mg capsule Take 30 mg by mouth two (2) times a day.   0   ??? EPINEPHrine (EPIPEN) 0.3 mg/0.3 mL injection Inject 0.3 mg into the muscle. (Patient not taking: Reported on 09/02/2020)     ??? HYSINGLA ER 20 mg TP24 Take 1 tablet by mouth every 24 hours (Patient not taking: Reported on 10/21/2020)     ??? lidocaine (LIDODERM) 5 % patch Apply 1 patch to skin as directed 12 hours on, 12 hours off (Patient not taking: Reported on 10/21/2020)     ??? lisinopriL (PRINIVIL,ZESTRIL) 20 MG tablet Take 1 tablet (20 mg total) by mouth daily. 30 tablet 6   ??? NARCAN 4 mg/actuation nasal spray spray (4 mg) in 1 nostril may repeat dose every 2-3 minutes as needed alternating nostrils with each dose (Patient not taking: Reported on 02/09/2021)       No current facility-administered medications on file prior to visit.       Immunization History   Administered Date(s) Administered   ??? COVID-19 VACC,MRNA,(PFIZER)(PF)(IM) 02/20/2020, 03/15/2020, 03/31/2020, 02/09/2021   ??? DTaP 11/06/1982, 02/19/1983, 05/29/1983, 07/30/1984, 09/23/1986   ??? INFLUENZA TIV (TRI) PF (IM) 11/08/2014   ??? Influenza Recomb PF (Quad) Injectable(Egg Free)18+ 10/23/2019   ??? Influenza Vaccine Quad (IIV4 PF) 58mo+ injectable 03/05/2019, 09/13/2020   ??? Influenza Virus Vaccine, unspecified formulation 07/22/2016, 03/05/2019   ??? MMR 10/02/1983   ??? OPV 11/06/1982, 02/19/1983, 05/29/1983, 07/30/1984, 09/23/1986   ??? PNEUMOCOCCAL POLYSACCHARIDE 23 01/19/2015   ??? PPD Test 11/08/2014   ??? Rabies IM Fibroblast Culture 05/08/2018, 05/11/2018, 05/15/2018, 05/29/2018   ??? TdaP 12/31/2002, 01/20/2013, 05/08/2018   ??? Tetanus and diptheria,(adult), adsorbed, 2Lf tetanus toxoid, PF 07/01/2003     ????  PHYSICAL EXAM??  Vital signs: BP 133/60 (BP Site: R Arm, BP Position: Sitting)  - Pulse 89  - Temp 36.7 ??C (98.1 ??F) (Temporal)  - Wt 73.7 kg (162 lb 6.4 oz)  - BMI 31.72 kg/m?? Body mass index is 31.72 kg/m??.  Gen: Well-developed, well-nourished adult in no apparent distress. Normocephalic with no external signs of trauma. Pleasant and cooperative. AOx4.?  HEENT: PERRLA, EOMI, MMM, oropharynx in pink without ulcerations or thrush. Face mask otherwise in place  Lungs: Broad chest excursion with good air movement. CTAB without wheezing/rhonchi/rales. ????  CV: RRR, Normal S1/S2, No murmurs/rubs/gallops heard.   Extremities: Warm, 2+ radial and pedal pulses, no C/C/E.????  Neuro: Good comprehension/cognition. CN 2-12 intact.  Muscle strength 5/5 in all extremities. Gait normal.????  Comprehensive Musculoskeletal Examination:????  ?? Jaw, neck without limited ROM.????  ?? Shoulders, elbows, wrists, hands, fingers:  No deformity, erythema, warmth, swelling, effusion, tenderness, limited ROM but reports pain with ROM of both shoulder with active and passive.??  ?? Lumbosacral spine and hips without limited ROM.????  ?? Knee, ankles, feet, toes: No deformity, erythema, warmth, swelling, effusion, tenderness, limited ROM. Knee stable to valgus/varus stress and anterior/posterior drawer sign.????  Skin: Nodule on left breast less swollen and red compared to photo she  shared. One area of active hidradenitis noted in left axilla     LABORATORY  Recent Results (from the past 1008 hour(s))   Creatinine    Collection Time: 02/09/21  1:21 PM   Result Value Ref Range    Creatinine 0.66 0.60 - 0.80 mg/dL    EGFR CKD-EPI Non-African American, Female >90 >=60 mL/min/1.35m2    EGFR CKD-EPI African American, Female >90 >=60 mL/min/1.91m2   AST    Collection Time: 02/09/21  1:21 PM   Result Value Ref Range    AST 14 <=34 U/L   ALT    Collection Time: 02/09/21  1:21 PM   Result Value Ref Range    ALT 7 (L) 10 - 49 U/L   CBC w/ Differential    Collection Time: 02/09/21  1:21 PM   Result Value Ref Range    WBC 13.4 (H) 3.5 - 10.5 10*9/L    RBC 4.68 3.90 - 5.03 10*12/L    HGB 13.7 12.0 - 15.5 g/dL    HCT 16.1 09.6 - 04.5 %    MCV 91.0 82.0 - 98.0 fL    MCH 29.3 26.0 - 34.0 pg    MCHC 32.2 30.0 - 36.0 g/dL    RDW 40.9 81.1 - 91.4 %    MPV 8.8 7.0 - 10.0 fL    Platelet 286 150 - 450 10*9/L    Neutrophils % 73.8 %    Lymphocytes % 18.4 %    Monocytes % 5.6 %    Eosinophils % 1.2 %    Basophils % 1.0 %    Absolute Neutrophils 9.9 (H) 1.7 - 7.7 10*9/L    Absolute Lymphocytes 2.5 0.7 - 4.0 10*9/L    Absolute Monocytes 0.8 0.1 - 1.0 10*9/L    Absolute Eosinophils 0.2 0.0 - 0.7 10*9/L    Absolute Basophils 0.1 0.0 - 0.1 10*9/L   C3 complement    Collection Time: 02/09/21  1:22 PM   Result Value Ref Range    C3 Complement 78 (L) 90 - 170 mg/dL   C4 complement    Collection Time: 02/09/21  1:22 PM   Result Value Ref Range    C4 Complement 9.5 (L) 12.0 - 36.0 mg/dL   Urinalysis with Culture Reflex    Collection Time: 02/09/21  1:51 PM    Specimen: Clean Catch; Urine   Result Value Ref Range    Color, UA Yellow     Clarity, UA Turbid     Specific Gravity, UA 1.025 1.005 - 1.030    pH, UA 8.5 5.0 - 9.0    Leukocyte Esterase, UA Negative Negative    Nitrite, UA Negative Negative    Protein, UA Trace (A) Negative    Glucose, UA Negative Negative    Ketones, UA Negative Negative    Urobilinogen, UA 1.0 mg/dL 0.2 - 2.0 mg/dL    Bilirubin, UA Negative Negative    Blood, UA Negative Negative    RBC, UA <1 0 - 3 /HPF    WBC, UA 7 (H) 0 - 3 /HPF    Squam Epithel, UA 4 0 - 5 /HPF    Bacteria, UA Many (A) None Seen /HPF    Amorphous Crystal, UA Many /HPF   Protein/Creatinine Ratio, Urine    Collection Time: 02/09/21  1:51 PM   Result Value Ref Range    Creat U 100.7 Undefined mg/dL    Protein, Ur 78.2 mg/dL    Protein/Creatinine Ratio, Urine 0.426 Undefined     ????  GENERAL SUMMARY AND IMPRESSION: ????  ????  In summary, the patient is a 40 y.o. female with h/o UCTD and hidradenitis suppurativa associated with inflammatory arthritis here for follow-up. Has been off immunosuppressive medications particularly adalimumab for several months and methotrexate most recently. Developed abscess on left breast and is currently on doxycycline. She is noting worsening pain likely due to being off her immunosuppressive medications. Clinical exam is reassuring given lack of swelling, tenderness and limited ROM except at the shoulders. Advised her to resume methotrexate and adalimumab based on continued healing of abscess and duration of doxycycline. If no improvement following resuming medications, will consider low dose prednisone taper. COVID-19 booster to be given today.  Labs today to assess disease activity. 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 Likely drug induced serology from adalimumab. Will monitor for now and she is already on hydroxychloroquine for which is overdue of eye exam and advised to complete. Follow-up in 4 months with Carlus Pavlov [facility] and 8 months with myself.  Return in about 8 months (around 10/09/2021).    RECOMMENDATIONS: ????  ????   Diagnosis ICD-10-CM Associated Orders   1. Connective tissue disease, undifferentiated (CMS-HCC)  M35.9 Urinalysis with Culture Reflex     Protein/Creatinine Ratio, Urine     CBC w/ Differential Creatinine     Anti-DNA antibody, double-stranded     C3 complement     C4 complement     AST     ALT     25 OH Vit D     Urine Culture   2. Methotrexate, long term, current use  Z79.899 CBC w/ Differential     Creatinine     AST     ALT     Patient Instructions   You can resume methotrexate next Friday, February 18th.     Hopefully, you can also resume Humira on Thursday, February 24th.     If no improvement, after being on medications on two weeks and you are no longer on antibiotics,  Will consider giving you a low dose prednisone taper.    Pfizer booster today.     Please get eye exam done as soon as possible and have report faxed to [phone] Attn: Dr. Scarlette Calico    The patient indicates understanding of these issues and agrees to the plan as outlined above.  Contact information provided for any concerns or questions in the interim.  ??  I personally spent 30 minutes face-to-face and non-face-to-face in the care of this patient, which includes all pre, intra, and post visit time on the date of service.    Sahej Schrieber C. Scarlette Calico, MD, PhD  Assistant Professor of Medicine  Department of Medicine/Division of Rheumatology  [facility]  9:02 PM

## 2021-02-09 NOTE — Unmapped (Signed)
You can resume methotrexate next Friday, February 18th.     Hopefully, you can also resume Humira on Thursday, February 24th.     If no improvement, after being on medications on two weeks and you are no longer on antibiotics,  Will consider giving you a low dose prednisone taper.    Pfizer booster today.     Please get eye exam done as soon as possible and have report faxed to [phone] Attn: Dr. Scarlette Calico

## 2021-02-10 NOTE — Unmapped (Signed)
Called and spoke to patient who states that she has been instructed by her Rheumatologist to hold Humira until 2/24. Patient has 2 doses on hand. Rescheduling call.

## 2021-02-13 LAB — VITAMIN D 25 HYDROXY: VITAMIN D, TOTAL (25OH): 33.9 ng/mL (ref 20.0–80.0)

## 2021-02-21 LAB — ANTI-DNA ANTIBODY, DOUBLE-STRANDED
DSDNA AB TITER: 1:80 {titer}
DSDNA ANTIBODY: POSITIVE — AB

## 2021-03-01 DIAGNOSIS — R110 Nausea: Principal | ICD-10-CM

## 2021-03-01 DIAGNOSIS — M3500 Sicca syndrome, unspecified: Principal | ICD-10-CM

## 2021-03-01 DIAGNOSIS — M359 Systemic involvement of connective tissue, unspecified: Principal | ICD-10-CM

## 2021-03-01 DIAGNOSIS — Z79899 Other long term (current) drug therapy: Principal | ICD-10-CM

## 2021-03-01 MED ORDER — BD TUBERCULIN SYRINGE 1 ML 25 GAUGE X 5/8"
7 refills | 0.00000 days | Status: CP
Start: 2021-03-01 — End: ?
  Filled 2021-03-02: qty 12, 84d supply, fill #0

## 2021-03-01 MED ORDER — PROMETHAZINE 25 MG TABLET
ORAL_TABLET | Freq: Four times a day (QID) | ORAL | 3 refills | 8.00000 days | Status: CP | PRN
Start: 2021-03-01 — End: ?
  Filled 2021-03-02: qty 30, 8d supply, fill #0

## 2021-03-01 MED ORDER — METHOTREXATE SODIUM 25 MG/ML INJECTION SOLUTION
SUBCUTANEOUS | 3 refills | 112.00000 days | Status: CN
Start: 2021-03-01 — End: ?
  Filled 2021-03-02: qty 8, 84d supply, fill #0

## 2021-03-01 NOTE — Unmapped (Signed)
Kim Charles has recently gotten back on her Humira. She reports she continues to have nausea with her methotrexate despite taking leucovorin the next day, and taking mtx dose at night. She requests a refill for something for nausea. I will reach out to clinic to see if appropriate.    She requested we ship her methotrexate vials and syringes (will document onboarding, but patient declined counseling)    [facility] Clinical Assessment & Refill Coordination Note    Andrey Spearman, DOB: [birth year]  Phone: There are no phone numbers on file.    All above HIPAA information was verified with patient.     Was a Nurse, learning disability used for this call? No    Specialty Medication(s):   Inflammatory Disorders: Humira and methotrexate (injectable)     Current Outpatient Medications   Medication Sig Dispense Refill   ??? acyclovir (ZOVIRAX) 400 MG tablet Take 1 tablet by mouth by mouth 2 times daily for prophylaxis  6   ??? albuterol (PROVENTIL HFA;VENTOLIN HFA) 90 mcg/actuation inhaler Inhale 2 puffs daily as needed.      ??? amLODIPine (NORVASC) 5 MG tablet Take 10 mg by mouth daily.      ??? aspirin-acetaminophen-caffeine (EXCEDRIN MIGRAINE) 250-250-65 mg per tablet Take 2 tablets by mouth.     ??? beclomethasone (QVAR) 40 mcg/actuation inhaler Inhale 2 puffs daily as needed.      ??? cloNIDine HCl (CATAPRES) 0.1 MG tablet Take 0.1 mg by mouth Three (3) times a day.      ??? dexlansoprazole (DEXILANT) 60 mg capsule Take 60 mg by mouth.     ??? empty container (SHARPS CONTAINER) Misc Use as directed 1 each 2   ??? EPINEPHrine (EPIPEN) 0.3 mg/0.3 mL injection Inject 0.3 mg into the muscle. (Patient not taking: Reported on 09/02/2020)     ??? ergocalciferol-1,250 mcg, 50,000 unit, (DRISDOL) 1,250 mcg (50,000 unit) capsule Take 50,000 Units by mouth.     ??? escitalopram oxalate (LEXAPRO) 10 MG tablet Take 10 mg by mouth daily.  3   ??? fluocinonide (LIDEX) 0.05 % cream Apply 1 application topically Two (2) times a day. To rash on face. Use for up to 2 weeks at a time. Practice sun protection. 30 g 1   ??? fluticasone propionate (FLOVENT HFA) 220 mcg/actuation inhaler 2 puffs.     ??? gabapentin (NEURONTIN) 300 MG capsule Take 300 mg by mouth Three (3) times a day.     ??? HUMIRA PEN CITRATE FREE 40 MG/0.4 ML Inject the contents of 1 pen (40 mg total) under the skin every seven (7) days. 4 each 4   ??? hydroCHLOROthiazide (HYDRODIURIL) 25 MG tablet Take 200 mg by mouth.      ??? HYDROcodone-acetaminophen (NORCO 10-325) 10-325 mg per tablet Take 1 tablet by mouth 5 times daily as needed for pain     ??? hydrocortisone 2.5 % cream 1-2 times a day on the red areas on the face for 1 week straight maximum. 30 g 1   ??? hydrOXYchloroQUINE (PLAQUENIL) 200 mg tablet Take 2 tablets (400 mg total) by mouth daily. 180 tablet 3   ??? HYSINGLA ER 20 mg TP24 Take 1 tablet by mouth every 24 hours (Patient not taking: Reported on 10/21/2020)     ??? ipratropium (ATROVENT) 0.03 % nasal spray 2 sprays into each nostril.     ??? leucovorin (WELLCOVORIN) 5 mg tablet Take 3 tablets (15 mg total) by mouth daily. Take 15 mg once a week 18-24  hours after the methotrexate. 90 tablet 3   ??? lidocaine (LIDODERM) 5 % patch Apply 1 patch to skin as directed 12 hours on, 12 hours off (Patient not taking: Reported on 10/21/2020)     ??? lisinopriL (PRINIVIL,ZESTRIL) 20 MG tablet Take 1 tablet (20 mg total) by mouth daily. 30 tablet 6   ??? metFORMIN (GLUCOPHAGE) 500 MG tablet TAKE 1 TABLET BY MOUTH 2 TIMES DAILY WITH MEALS 180 tablet 3   ??? methotrexate 25 mg/mL injection solution Inject 0.6 mL (15 mg total) under the skin once a week. 10 mL 3   ??? methotrexate, Preservative Free, 25 mg/mL Soln vial/syringe inject 0.5mls under the skin once a week 12 mL 1   ??? MITIGARE 0.6 mg cap capsule Take 0.6 mg by mouth daily.     ??? montelukast (SINGULAIR) 10 mg tablet Take 10 mg by mouth nightly.     ??? NARCAN 4 mg/actuation nasal spray spray (4 mg) in 1 nostril may repeat dose every 2-3 minutes as needed alternating nostrils with each dose (Patient not taking: Reported on 02/09/2021)     ??? promethazine (PHENERGAN) 25 MG tablet Take 1 tablet (25 mg total) by mouth every six (6) hours as needed for nausea. 30 tablet 3   ??? propranolol (INDERAL) 40 MG tablet Take 40 mg by mouth Two (2) times a day.      ??? spironolactone (ALDACTONE) 25 MG tablet Take 25 mg by mouth.     ??? syringe with needle (BD TUBERCULIN SYRINGE) 1 mL 25 gauge x 5/8 Syrg Use 1 syringe once a week for methotrexate injections. 50 each 7   ??? topiramate (TOPAMAX) 50 MG tablet TAKE 1 TABLET BY MOUTH 2 TIMES DAILY  5   ??? traZODone (DESYREL) 50 MG tablet Take 50 mg by mouth.     ??? VYVANSE 30 mg capsule Take 30 mg by mouth two (2) times a day.   0     No current facility-administered medications for this visit.        Changes to medications: Carlei reports no changes at this time.    Allergies   Allergen Reactions   ??? Other Hives, Itching and Other (See Comments)     High fever. Hives and itching all over the body.   ??? Shellfish Containing Products Anaphylaxis   ??? Sulfasalazine Hives   ??? Toradol [Ketorolac] Hives   ??? Venom-Honey Bee Anaphylaxis   ??? Opioids - Morphine Analogues      Other reaction(s): Other  Other reaction(s): Other (See Comments)  Causes migraine  Headache   ??? Tramadol Nausea And Vomiting   ??? Hydromorphone Hcl      migraine   ??? Naproxen      Grits teeth       Changes to allergies: No    SPECIALTY MEDICATION ADHERENCE     Humira 40/0.4 mg/ml: 0 left  Medication Adherence    Patient reported X missed doses in the last month: 2  Specialty Medication: Humira  Patient is on additional specialty medications: Yes  Additional Specialty Medications: methotrexate  Patient Reported Additional Medication X Missed Doses in the Last Month: 1          Specialty medication(s) dose(s) confirmed: Regimen is correct and unchanged.     Are there any concerns with adherence? No    Adherence counseling provided? Not needed    CLINICAL MANAGEMENT AND INTERVENTION      Clinical Benefit Assessment:    Do you feel the  medicine is effective or helping your condition? Yes    Clinical Benefit counseling provided? Not needed    Adverse Effects Assessment:    Are you experiencing any side effects? No    Are you experiencing difficulty administering your medicine? No    Quality of Life Assessment:    How many days over the past month did your HS  keep you from your normal activities? For example, brushing your teeth or getting up in the morning. 0    Have you discussed this with your provider? Not needed    Therapy Appropriateness:    Is therapy appropriate? Yes, therapy is appropriate and should be continued    DISEASE/MEDICATION-SPECIFIC INFORMATION      For patients on injectable medications: Patient currently has 0 doses left.  Next injection is scheduled for Tues, 3/8.    PATIENT SPECIFIC NEEDS     - Does the patient have any physical, cognitive, or cultural barriers? No    - Is the patient high risk? No    - Does the patient require a Care Management Plan? No     - Does the patient require physician intervention or other additional services (i.e. nutrition, smoking cessation, social work)? No      SHIPPING     Specialty Medication(s) to be Shipped:   Inflammatory Disorders: Humira and methotrexate (injectable)    Other medication(s) to be shipped: No additional medications requested for fill at this time     Changes to insurance: No    Delivery Scheduled: Yes, Expected medication delivery date: Friday, 3/4.     Medication will be delivered via UPS to the confirmed prescription address in [facility].    The patient will receive a drug information handout for each medication shipped and additional FDA Medication Guides as required.  Verified that patient has previously received a Conservation officer, historic buildings.    All of the patient's questions and concerns have been addressed.    Lanney Gins   [facility] Specialty Pharmacist

## 2021-03-02 MED FILL — HUMIRA PEN CITRATE FREE 40 MG/0.4 ML: SUBCUTANEOUS | 28 days supply | Qty: 4 | Fill #1

## 2021-03-02 NOTE — Unmapped (Signed)
[facility]   Patient Onboarding/Medication Counseling    [name] is a 40 y.o. 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  I am speaking to the patient.    Was a Nurse, learning disability used for this call? No    Verified patient's date of birth / HIPAA.    Specialty medication(s) to be sent: Inflammatory Disorders: methotrexate (injectable)      Non-specialty medications/supplies to be sent: syringes, promethazine      Medications not needed at this time: na         methotrexate injection    Medication & Administration     Dosage: Inject 0.6 mL (15mg ) subcutaneously every 7 days      Lab tests required prior to treatment initiation:  ??? Pregnancy: Pregnancy status unconfirmed in patient chart but medication prescriber has indicated they are aware and wishing to initiate treatment at this time.      Administration:     Vials and syringes  1. Gather all supplies needed for injection on a clean, flat working surface: medication vial, syringe with needle, alcohol swabs, sharps container, etc.  2. Look at the medication label ??? look for correct medication, correct dose, and check the expiration date  3. Look at the medication ??? the liquid in the syringe should appear clear and yellow in color  4. Select injection site ??? you can use the front of your thigh or your belly (but not the area 2 inches around your belly button)  5. Prepare injection site ??? wash your hands and clean the skin at the injection site with an alcohol swab and let it air dry, do not touch the injection site again before the injection  6. Pop the protective cap off the top of the vial if it's the first use for that vial and wipe the rubber stopper with an alcohol wipe and allow to air dry  7. Open syringe and needle and carefully remove the needle cap, pull the plunger back to the volume you'll need to inject  8. With the methotrexate vial on a flat surface, insert the needle straight through the middle of the rubber stopper until the needle is all the way down, push the plunger until all the air is injected into the vial  9. Hold the vial and syringe and turn them upside down while keeping the needle in the vial, pull down the plunger to withdraw the desired volume of liquid from the vial, carefully remove the needle from the vial  10. Pinch the skin ??? with your hand not holding the syringe pinch up a fold of skin at the injection site using your forefinger and thumb  11. Insert the needle into the fold of skin at about a 45 degree angle ??? it's best to use a quick dart-like motion ??? with the needle inserted, release the pinch of skin and allow the skin to relax  12. Push the plunger down slowly as far as it will go until the syringe is empty  13. Check that the syringe is empty and carefully pull the needle out at the same angle as inserted; after the needle is removed completely from the skin  14. Dispose of the used syringe immediately in your sharps disposal container  15. If you see any blood at the injection site, press a cotton ball or gauze on the site and maintain pressure until the bleeding stops,  do not rub the injection site      Adherence/Missed dose instructions:  If your injection is given more than 2 days after your scheduled injection date ??? consult your pharmacist for additional instructions on how to adjust your dosing schedule.    Goals of Therapy     Rheumatoid arthritis  ??? Achieve symptom remission  ??? Slow disease progression  ??? Protection of remaining articular structures  ??? Maintenance of function  ??? Maintenance of effective psychosocial functioning      Side Effects & Monitoring Parameters     ??? Upset stomach, diarrhea, nausea or throwing up  ??? Feeling dizzy, tired, or weak  ??? Hair thinning (reversible)  ??? Minor cold-like symptoms  ??? Photosensitivity ??? use sunscreen and avoid prolonged exposure    The following side effects should be reported to the provider:  ??? Signs of a hypersensitivity reaction ??? rash; hives; itching; red, swollen, blistered, or peeling skin; wheezing; tightness in the chest or throat; difficulty breathing, swallowing, or talking; swelling of the mouth, face, lips, tongue, or throat; etc.  ??? Reduced immune function ??? report signs of infection such as fever; chills; body aches; very bad sore throat; ear or sinus pain; cough; more sputum or change in color of sputum; pain with passing urine; wound that will not heal, etc.  Also at a slightly higher risk of some malignancies (mainly skin and blood cancers) due to this reduced immune function.  o In the case of signs of infection ??? the patient should hold the next dose of methotrexate and call your primary care provider to ensure adequate medical care.  Treatment may be resumed when infection is treated and patient is asymptomatic.  ??? Signs of bleeding ??? throwing up or coughing up blood; blood in the urine; black, red, or tarry stool; abnormal vaginal bleeding; bruises without a cause or that get bigger  ??? Signs of pancreatitis ??? sudden very bad stomach pain, unexplained vomiting  ??? Signs of kidney dysfunction ??? unable to pass urine; blood in the urine; sudden weight gain  ??? Signs of liver dysfunction ??? darkened urine; upset stomach; light-colored stool; yellow skin or eyes  ??? Signs of nerve problems ??? burning; numbness; tingling  ??? Pinpoint red spots on the skin  ??? Changes in eyesight      Contraindications, Warnings, & Precautions     ??? Have your bloodwork checked as you have been told by your prescriber  ??? Talk with your doctor if you are pregnant, planning to become pregnant, or breastfeeding ??? women taking this medication must use birth control while taking this drug and for some time after the last dose  ??? Discuss the possible need for holding your dose(s) of methotrexate when a planned procedure is scheduled with the prescriber as it may delay healing/recovery timeline       Drug/Food Interactions     ??? Medication list reviewed in Epic. The patient was instructed to inform the care team before taking any new medications or supplements. No drug interactions identified.     Storage, Handling Precautions, & Disposal     ??? Store intact vials at room temperature  ??? Protect from light  ??? Dispose of used syringes in a sharps disposal container  ??? 50mg /94ml vials contain preservative and can be used as a multi-dose vial to be discarded 30 days after the first puncture if stored in refrigeration and protected from light  Current Medications (including OTC/herbals), Comorbidities and Allergies     Current Outpatient Medications   Medication Sig Dispense Refill   ??? acyclovir (ZOVIRAX) 400 MG tablet Take 1 tablet by mouth by mouth 2 times daily for prophylaxis  6   ??? albuterol (PROVENTIL HFA;VENTOLIN HFA) 90 mcg/actuation inhaler Inhale 2 puffs daily as needed.      ??? amLODIPine (NORVASC) 5 MG tablet Take 10 mg by mouth daily.      ??? aspirin-acetaminophen-caffeine (EXCEDRIN MIGRAINE) 250-250-65 mg per tablet Take 2 tablets by mouth.     ??? beclomethasone (QVAR) 40 mcg/actuation inhaler Inhale 2 puffs daily as needed.      ??? cloNIDine HCl (CATAPRES) 0.1 MG tablet Take 0.1 mg by mouth Three (3) times a day.      ??? dexlansoprazole (DEXILANT) 60 mg capsule Take 60 mg by mouth.     ??? empty container (SHARPS CONTAINER) Misc Use as directed 1 each 2   ??? EPINEPHrine (EPIPEN) 0.3 mg/0.3 mL injection Inject 0.3 mg into the muscle. (Patient not taking: Reported on 09/02/2020)     ??? ergocalciferol-1,250 mcg, 50,000 unit, (DRISDOL) 1,250 mcg (50,000 unit) capsule Take 50,000 Units by mouth.     ??? escitalopram oxalate (LEXAPRO) 10 MG tablet Take 10 mg by mouth daily.  3   ??? fluocinonide (LIDEX) 0.05 % cream Apply 1 application topically Two (2) times a day. To rash on face. Use for up to 2 weeks at a time. Practice sun protection. 30 g 1   ??? fluticasone propionate (FLOVENT HFA) 220 mcg/actuation inhaler 2 puffs.     ??? gabapentin (NEURONTIN) 300 MG capsule Take 300 mg by mouth Three (3) times a day.     ??? HUMIRA PEN CITRATE FREE 40 MG/0.4 ML Inject the contents of 1 pen (40 mg total) under the skin every seven (7) days. 4 each 4   ??? hydroCHLOROthiazide (HYDRODIURIL) 25 MG tablet Take 200 mg by mouth.      ??? HYDROcodone-acetaminophen (NORCO 10-325) 10-325 mg per tablet Take 1 tablet by mouth 5 times daily as needed for pain     ??? hydrocortisone 2.5 % cream 1-2 times a day on the red areas on the face for 1 week straight maximum. 30 g 1   ??? hydrOXYchloroQUINE (PLAQUENIL) 200 mg tablet Take 2 tablets (400 mg total) by mouth daily. 180 tablet 3   ??? HYSINGLA ER 20 mg TP24 Take 1 tablet by mouth every 24 hours (Patient not taking: Reported on 10/21/2020)     ??? ipratropium (ATROVENT) 0.03 % nasal spray 2 sprays into each nostril.     ??? leucovorin (WELLCOVORIN) 5 mg tablet Take 3 tablets (15 mg total) by mouth daily. Take 15 mg once a week 18-24 hours after the methotrexate. 90 tablet 3   ??? lidocaine (LIDODERM) 5 % patch Apply 1 patch to skin as directed 12 hours on, 12 hours off (Patient not taking: Reported on 10/21/2020)     ??? lisinopriL (PRINIVIL,ZESTRIL) 20 MG tablet Take 1 tablet (20 mg total) by mouth daily. 30 tablet 6   ??? metFORMIN (GLUCOPHAGE) 500 MG tablet TAKE 1 TABLET BY MOUTH 2 TIMES DAILY WITH MEALS 180 tablet 3   ??? methotrexate 25 mg/mL injection solution Inject 0.6 mL (15 mg total) under the skin once a week. 10 mL 3   ??? methotrexate, Preservative Free, 25 mg/mL Soln vial/syringe inject 0.58mls under the skin once a week 12 mL 1   ??? MITIGARE 0.6 mg cap  capsule Take 0.6 mg by mouth daily.     ??? montelukast (SINGULAIR) 10 mg tablet Take 10 mg by mouth nightly.     ??? NARCAN 4 mg/actuation nasal spray spray (4 mg) in 1 nostril may repeat dose every 2-3 minutes as needed alternating nostrils with each dose (Patient not taking: Reported on 02/09/2021)     ??? promethazine (PHENERGAN) 25 MG tablet Take 1 tablet (25 mg total) by mouth every six (6) hours as needed for nausea. 30 tablet 3   ??? propranolol (INDERAL) 40 MG tablet Take 40 mg by mouth Two (2) times a day.      ??? spironolactone (ALDACTONE) 25 MG tablet Take 25 mg by mouth.     ??? syringe with needle (BD TUBERCULIN SYRINGE) 1 mL 25 gauge x 5/8 Syrg Use 1 syringe once a week for methotrexate injections. 50 each 7   ??? topiramate (TOPAMAX) 50 MG tablet TAKE 1 TABLET BY MOUTH 2 TIMES DAILY  5   ??? traZODone (DESYREL) 50 MG tablet Take 50 mg by mouth.     ??? VYVANSE 30 mg capsule Take 30 mg by mouth two (2) times a day.   0     No current facility-administered medications for this visit.       Allergies   Allergen Reactions   ??? Other Hives, Itching and Other (See Comments)     High fever. Hives and itching all over the body.   ??? Shellfish Containing Products Anaphylaxis   ??? Sulfasalazine Hives   ??? Toradol [Ketorolac] Hives   ??? Venom-Honey Bee Anaphylaxis   ??? Opioids - Morphine Analogues      Other reaction(s): Other  Other reaction(s): Other (See Comments)  Causes migraine  Headache   ??? Tramadol Nausea And Vomiting   ??? Hydromorphone Hcl      migraine   ??? Naproxen      Grits teeth       Patient Active Problem List   Diagnosis   ??? Connective tissue disease, undifferentiated (CMS-HCC)   ??? Tobacco abuse   ??? Fibromyalgia   ??? Hidradenitis suppurativa       Reviewed and up to date in Epic.    Appropriateness of Therapy     Is medication and dose appropriate based on diagnosis? Yes    Prescription has been clinically reviewed: Yes    Baseline Quality of Life Assessment      How many days over the past month did your UCTD  keep you from your normal activities? For example, brushing your teeth or getting up in the morning. Patient declined to answer    Financial Information     Medication Assistance provided: None Required    Anticipated copay of $3 reviewed with patient. Verified delivery address.    Delivery Information     Scheduled delivery date: 3/4    Expected start date: NA/ continuation. Takes mtx doses on Monday    Medication will be delivered via UPS to the prescription address in [facility].  This shipment will not require a signature.      Explained the services we provide at [facility] and that each month we would call to set up refills.  Stressed importance of returning phone calls so that we could ensure they receive their medications in time each month.  Informed patient that we should be setting up refills 7-10 days prior to when they will run out of medication.  A pharmacist will reach out  to perform a clinical assessment periodically.  Informed patient that a welcome packet, containing information about our pharmacy and other support services, a Notice of Privacy Practices, and a drug information handout will be sent.      Patient verbalized understanding of the above information as well as how to contact the pharmacy at [phone] option 4 with any questions/concerns.  The pharmacy is open Monday through Friday 8:30am-4:30pm.  A pharmacist is available 24/7 via pager to answer any clinical questions they may have.    Patient Specific Needs     - Does the patient have any physical, cognitive, or cultural barriers? No    - Patient prefers to have medications discussed with  Patient     - Is the patient or caregiver able to read and understand education materials at a high school level or above? Yes    - Patient's primary language is  English     - Is the patient high risk? No    - Does the patient require a Care Management Plan? No     - Does the patient require physician intervention or other additional services (i.e. nutrition, smoking cessation, social work)? No      Kim Charles A Desiree Lucy Shared [facility] Specialty Pharmacist

## 2021-03-13 ENCOUNTER — Ambulatory Visit: Payer: Medicaid Other | Admitting: Internal Medicine

## 2021-03-24 DIAGNOSIS — L732 Hidradenitis suppurativa: Principal | ICD-10-CM

## 2021-03-24 NOTE — Unmapped (Signed)
[facility] Refill Coordination Note    Specialty Medication(s) to be Shipped:   Inflammatory Disorders: Humira    Other medication(s) to be shipped: No additional medications requested for fill at this time     Andrey Spearman, DOB: Dec 24, 1981  Phone: There are no phone numbers on file.      All above HIPAA information was verified with patient.     Was a Nurse, learning disability used for this call? No    Completed refill call assessment today to schedule patient's medication shipment from the [facility] [phone]).       Specialty medication(s) and dose(s) confirmed: Regimen is correct and unchanged.   Changes to medications: Fabianna reports no changes at this time.  Changes to insurance: No  Questions for the pharmacist: No    Confirmed patient received a Conservation officer, historic buildings and a Surveyor, mining with first shipment. The patient will receive a drug information handout for each medication shipped and additional FDA Medication Guides as required.       DISEASE/MEDICATION-SPECIFIC INFORMATION        For patients on injectable medications: Patient currently has 0 doses left.  Next injection is scheduled for 03/28/21.    SPECIALTY MEDICATION ADHERENCE     Medication Adherence    Patient reported X missed doses in the last month: 0  Specialty Medication: Humira CF 40 mg/0.4 ml   Patient is on additional specialty medications: No                SHIPPING     Shipping address confirmed in Epic.     Delivery Scheduled: Yes, Expected medication delivery date: 03/28/21.     Medication will be delivered via UPS to the prescription address in Epic WAM.    [place] A Karenna Romanoff   [facility] Specialty Technician

## 2021-03-27 MED FILL — HUMIRA PEN CITRATE FREE 40 MG/0.4 ML: SUBCUTANEOUS | 28 days supply | Qty: 4 | Fill #2

## 2021-04-06 MED FILL — METHOTREXATE SODIUM 25 MG/ML INJECTION SOLUTION: SUBCUTANEOUS | 42 days supply | Qty: 4 | Fill #1

## 2021-04-06 NOTE — Unmapped (Signed)
[facility] Pharmacist Intervention    Type of intervention: inappropriate usage    Medication: Methotrexate    Problem: Kim Charles called in to the pharmacy to report she ran out of methotrexate vials.    Intervention: I clarified with Kim Charles that she should use each vial for THREE doses, and refrigerate the vial after first puncture.     She's used medication before, we discussed at onboarding, and I verified with her old pharmacy that this is how they billed/filled medication for her - It's unclear why Kim Charles used each vial only once.    We will work with insurance to obtain a ONE-TIME override (I explained to Kim Charles this can be used once annually) and ship for UPS delivery 4/8 to prescription address.    Follow up needed: na    Approximate time spent: 10 minutes    Zoran Yankee A Desiree Lucy Shared [facility] Specialty Pharmacist

## 2021-04-18 NOTE — Unmapped (Signed)
[facility] Refill Coordination Note    Specialty Medication(s) to be Shipped:   Inflammatory Disorders: Humira    Other medication(s) to be shipped: Promethazine and Sharps container     Kim Charles, DOB: 27-Feb-1981  Phone: There are no phone numbers on file.      All above HIPAA information was verified with patient.     Was a Nurse, learning disability used for this call? No    Completed refill call assessment today to schedule patient's medication shipment from the [facility] [phone]).  All relevant notes have been reviewed.     Specialty medication(s) and dose(s) confirmed: Regimen is correct and unchanged.   Changes to medications: Kim Charles reports no changes at this time.  Changes to insurance: No  New side effects reported not previously addressed with a pharmacist or physician: None reported  Questions for the pharmacist: No    Confirmed patient received a Conservation officer, historic buildings and a Surveyor, mining with first shipment. The patient will receive a drug information handout for each medication shipped and additional FDA Medication Guides as required.       DISEASE/MEDICATION-SPECIFIC INFORMATION        N/A    SPECIALTY MEDICATION ADHERENCE     Medication Adherence    Patient reported X missed doses in the last month: 0  Specialty Medication: Humira CF 40 mg/0.4 ml  Patient is on additional specialty medications: No  Any gaps in refill history greater than 2 weeks in the last 3 months: no  Demonstrates understanding of importance of adherence: yes  Informant: patient  Reliability of informant: reliable  Confirmed plan for next specialty medication refill: delivery by pharmacy  Refills needed for supportive medications: not needed              Were doses missed due to medication being on hold? No    Humira CF 40 mg/0.4 ml mg/ml: 7 days of medicine on hand       REFERRAL TO PHARMACIST     Referral to the pharmacist: Not needed      [facility]     Shipping address confirmed in Epic. Delivery Scheduled: Yes, Expected medication delivery date: 04/20/2021.     Medication will be delivered via UPS to the prescription address in Epic WAM.    [name]   [facility] Specialty Technician

## 2021-04-19 MED FILL — HUMIRA PEN CITRATE FREE 40 MG/0.4 ML: SUBCUTANEOUS | 28 days supply | Qty: 4 | Fill #3

## 2021-04-19 MED FILL — PROMETHAZINE 25 MG TABLET: ORAL | 8 days supply | Qty: 30 | Fill #1

## 2021-04-19 MED FILL — EMPTY CONTAINER: 120 days supply | Qty: 1 | Fill #2

## 2021-05-10 NOTE — Unmapped (Signed)
[facility] Refill Coordination Note    Specialty Medication(s) to be Shipped:   Inflammatory Disorders: Humira and methotrexate (injectable)    Other medication(s) to be shipped: No additional medications requested for fill at this time     Kim Charles, DOB: 1981-09-25  Phone: There are no phone numbers on file.      All above HIPAA information was verified with patient.     Was a Nurse, learning disability used for this call? No    Completed refill call assessment today to schedule patient's medication shipment from the [facility] [phone]).  All relevant notes have been reviewed.     Specialty medication(s) and dose(s) confirmed: Regimen is correct and unchanged.   Changes to medications: Kim Charles reports no changes at this time.  Changes to insurance: No  New side effects reported not previously addressed with a pharmacist or physician: None reported  Questions for the pharmacist: No    Confirmed patient received a Conservation officer, historic buildings and a Surveyor, mining with first shipment. The patient will receive a drug information handout for each medication shipped and additional FDA Medication Guides as required.       DISEASE/MEDICATION-SPECIFIC INFORMATION        N/A    SPECIALTY MEDICATION ADHERENCE     Medication Adherence    Patient reported X missed doses in the last month: 0  Specialty Medication: methotrexate   Patient is on additional specialty medications: Yes  Additional Specialty Medications: Humira  Patient Reported Additional Medication X Missed Doses in the Last Month: 0              Were doses missed due to medication being on hold? No    Humira 1 dose on hand   methotrexate (injectable)  0 days worth of medication on hand.        REFERRAL TO PHARMACIST     Referral to the pharmacist: Not needed      [facility]     Shipping address confirmed in Epic.     Delivery Scheduled: Yes, Expected medication delivery date: 05/17/21.     Medication will be delivered via UPS to the prescription address in Epic WAM.    Kim Charles   [facility] Shared [facility] Specialty Technician

## 2021-05-11 DIAGNOSIS — L732 Hidradenitis suppurativa: Principal | ICD-10-CM

## 2021-05-11 DIAGNOSIS — M3500 Sicca syndrome, unspecified: Principal | ICD-10-CM

## 2021-05-11 DIAGNOSIS — M359 Systemic involvement of connective tissue, unspecified: Principal | ICD-10-CM

## 2021-05-11 MED ORDER — METHOTREXATE (PF) 15 MG/0.3 ML SUBCUTANEOUS AUTO-INJECTOR
SUBCUTANEOUS | 11 refills | 0.00000 days | Status: CP
Start: 2021-05-11 — End: ?
  Filled 2021-05-22: qty 1.2, 28d supply, fill #0

## 2021-05-12 NOTE — Unmapped (Signed)
[facility] Specialty Medication Onboarding    Specialty Medication: Rasuvo 15mg /0.8ml pens  Prior Authorization: Not Required   Financial Assistance: No - copay  <$25  Final Copay/Day Supply: $3 / 28 days    Insurance Restrictions: None     Notes to Pharmacist: Methotrexate vials were scheduled.     The triage team has completed the benefits investigation and has determined that the patient is able to fill this medication at [facility]. Please contact the patient to complete the onboarding or follow up with the prescribing physician as needed.

## 2021-05-15 NOTE — Unmapped (Signed)
[facility]   Patient Onboarding/Medication Counseling    Ms.Nucci is a 40 y.o. 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  I am speaking to the patient's family member, husband. Pt is switching from vials to rasuvo pen.    Was a Nurse, learning disability used for this call? No    Verified patient's date of birth / HIPAA.    Specialty medication(s) to be sent: Inflammatory Disorders: Rasuvo      Non-specialty medications/supplies to be sent: na      Medications not needed at this time: na         Rasuvo (methotrexate injection)    Medication & Administration     Dosage: Inject the contents of one auto-injector (15mg ) under the skin every 7 days    Administration:     Auto-injector pen  1. Gather all supplies needed for injection on a clean, flat working surface: medication pen, alcohol swabs, sharps container, etc.  2. Look at the medication label - look for correct medication, correct dose, and check the expiration date  3. Look at the medication - the liquid in the viewing window of the pen device should appear clear and yellow in color  4. Select injection site - you can use the front of your thigh or your belly (but not the area 2 inches around your belly button)  5. Prepare injection site - wash your hands and clean the skin at the injection site with an alcohol swab and let it air dry, do not touch the injection site again before the injection  6. Remove the yellow safety cap by pulling it straight off, do not attempt to twist it; the pen is now ready to use  7. Pinch up a pad of skin surrounding the cleaned injection site with your thumb and forefinger of your hand not holding the auto-injector pen  8. Position the uncapped transparent end of the auto-injector against your cleaned injection site at a 90 degree angle; push the pen firmly onto your skin until you feel the stop point in order to unlock the yellow injection button  9. While holding the pen firmly in place, depress the yellow injection button with your thumb to initiate the injection process; you will hear a click when the process begins; continue to hold the pen firmly in place for at least 5 seconds; visualize the transparent control zone of the pen to make sure the entire dose is injection  10. When the movement stopped the injection is completed and you can pull the pen away from your injection site  11. Dispose of the used auto-injector immediately in your sharps disposal container  12. If you see any blood at the injection site, press a cotton ball or gauze on the site and maintain pressure until the bleeding stops, do not rub the injection site      Adherence/Missed dose instructions:  If your injection is given more than 2 days after your scheduled injection date - consult your pharmacist for additional instructions on how to adjust your dosing schedule.    Goals of Therapy     UCTD  ??? Achieve symptom remission  ??? Slow disease progression  ??? Protection of remaining articular structures  ??? Maintenance of function  ??? Maintenance of effective psychosocial functioning    Side Effects & Monitoring Parameters     ??? Upset stomach, diarrhea, nausea or throwing up  ??? Feeling dizzy, tired, or  weak  ??? Hair thinning (reversible)  ??? Minor cold-like symptoms  ??? Photosensitivity - use sunscreen and avoid prolonged exposure    The following side effects should be reported to the provider:  ??? Signs of a hypersensitivity reaction - rash; hives; itching; red, swollen, blistered, or peeling skin; wheezing; tightness in the chest or throat; difficulty breathing, swallowing, or talking; swelling of the mouth, face, lips, tongue, or throat; etc.  ??? Reduced immune function - report signs of infection such as fever; chills; body aches; very bad sore throat; ear or sinus pain; cough; more sputum or change in color of sputum; pain with passing urine; wound that will not heal, etc.  Also at a slightly higher risk of some malignancies (mainly skin and blood cancers) due to this reduced immune function.  o In the case of signs of infection - the patient should hold the next dose of Rasuvo?? and call your primary care provider to ensure adequate medical care.  Treatment may be resumed when infection is treated and patient is asymptomatic.  ??? Signs of bleeding - throwing up or coughing up blood; blood in the urine; black, red, or tarry stool; abnormal vaginal bleeding; bruises without a cause or that get bigger  ??? Signs of pancreatitis - sudden very bad stomach pain, unexplained vomiting  ??? Signs of kidney dysfunction - unable to pass urine; blood in the urine; sudden weight gain  ??? Signs of liver dysfunction - darkened urine; upset stomach; light-colored stool; yellow skin or eyes  ??? Signs of nerve problems - burning; numbness; tingling  ??? Pinpoint red spots on the skin  ??? Changes in eyesight      Contraindications, Warnings, & Precautions     ??? Have your bloodwork checked as you have been told by your prescriber  ??? Talk with your doctor if you are pregnant, planning to become pregnant, or breastfeeding - women taking this medication must use birth control while taking this drug and for some time after the last dose  ??? Discuss the possible need for holding your dose(s) of methotrexate when a planned procedure is scheduled with the prescriber as it may delay healing/recovery timeline       Drug/Food Interactions     ??? Medication list reviewed in Epic. The patient was instructed to inform the care team before taking any new medications or supplements. No drug interactions identified.     Storage, Handling Precautions, & Disposal     ??? Store intact pens at room temperature  ??? Protect from light  ??? Dispose of used pens in a sharps disposal container            Current Medications (including OTC/herbals), Comorbidities and Allergies     Current Outpatient Medications   Medication Sig Dispense Refill   ??? acyclovir (ZOVIRAX) 400 MG tablet Take 1 tablet by mouth by mouth 2 times daily for prophylaxis  6   ??? albuterol (PROVENTIL HFA;VENTOLIN HFA) 90 mcg/actuation inhaler Inhale 2 puffs daily as needed.      ??? amLODIPine (NORVASC) 5 MG tablet Take 10 mg by mouth daily.      ??? aspirin-acetaminophen-caffeine (EXCEDRIN MIGRAINE) 250-250-65 mg per tablet Take 2 tablets by mouth.     ??? beclomethasone (QVAR) 40 mcg/actuation inhaler Inhale 2 puffs daily as needed.      ??? cloNIDine HCl (CATAPRES) 0.1 MG tablet Take 0.1 mg by mouth Three (3) times a day.      ??? dexlansoprazole (DEXILANT) 60 mg capsule  Take 60 mg by mouth.     ??? empty container (SHARPS CONTAINER) Misc Use as directed 1 each 2   ??? EPINEPHrine (EPIPEN) 0.3 mg/0.3 mL injection Inject 0.3 mg into the muscle. (Patient not taking: Reported on 09/02/2020)     ??? ergocalciferol-1,250 mcg, 50,000 unit, (DRISDOL) 1,250 mcg (50,000 unit) capsule Take 50,000 Units by mouth.     ??? escitalopram oxalate (LEXAPRO) 10 MG tablet Take 10 mg by mouth daily.  3   ??? fluocinonide (LIDEX) 0.05 % cream Apply 1 application topically Two (2) times a day. To rash on face. Use for up to 2 weeks at a time. Practice sun protection. 30 g 1   ??? fluticasone propionate (FLOVENT HFA) 220 mcg/actuation inhaler 2 puffs.     ??? gabapentin (NEURONTIN) 300 MG capsule Take 300 mg by mouth Three (3) times a day.     ??? HUMIRA PEN CITRATE FREE 40 MG/0.4 ML Inject the contents of 1 pen (40 mg total) under the skin every seven (7) days. 4 each 4   ??? hydroCHLOROthiazide (HYDRODIURIL) 25 MG tablet Take 200 mg by mouth.      ??? HYDROcodone-acetaminophen (NORCO 10-325) 10-325 mg per tablet Take 1 tablet by mouth 5 times daily as needed for pain     ??? hydrocortisone 2.5 % cream 1-2 times a day on the red areas on the face for 1 week straight maximum. 30 g 1   ??? hydrOXYchloroQUINE (PLAQUENIL) 200 mg tablet Take 2 tablets (400 mg total) by mouth daily. 180 tablet 3   ??? HYSINGLA ER 20 mg TP24 Take 1 tablet by mouth every 24 hours (Patient not taking: Reported on 10/21/2020)     ??? ipratropium (ATROVENT) 0.03 % nasal spray 2 sprays into each nostril.     ??? leucovorin (WELLCOVORIN) 5 mg tablet Take 3 tablets (15 mg total) by mouth daily. Take 15 mg once a week 18-24 hours after the methotrexate. 90 tablet 3   ??? lidocaine (LIDODERM) 5 % patch Apply 1 patch to skin as directed 12 hours on, 12 hours off (Patient not taking: Reported on 10/21/2020)     ??? lisinopriL (PRINIVIL,ZESTRIL) 20 MG tablet Take 1 tablet (20 mg total) by mouth daily. 30 tablet 6   ??? metFORMIN (GLUCOPHAGE) 500 MG tablet TAKE 1 TABLET BY MOUTH 2 TIMES DAILY WITH MEALS 180 tablet 3   ??? methotrexate, PF, 15 mg/0.3 mL AtIn Inject the contents of 1 pen (15 mg) under the skin every seven (7) days. 1.2 mL 11   ??? MITIGARE 0.6 mg cap capsule Take 0.6 mg by mouth daily.     ??? montelukast (SINGULAIR) 10 mg tablet Take 10 mg by mouth nightly.     ??? NARCAN 4 mg/actuation nasal spray spray (4 mg) in 1 nostril may repeat dose every 2-3 minutes as needed alternating nostrils with each dose (Patient not taking: Reported on 02/09/2021)     ??? promethazine (PHENERGAN) 25 MG tablet Take 1 tablet (25 mg total) by mouth every six (6) hours as needed for nausea. 30 tablet 3   ??? propranolol (INDERAL) 40 MG tablet Take 40 mg by mouth Two (2) times a day.      ??? spironolactone (ALDACTONE) 25 MG tablet Take 25 mg by mouth.     ??? syringe with needle (BD TUBERCULIN SYRINGE) 1 mL 25 gauge x 5/8 Syrg Use 1 syringe once a week for methotrexate injections. 50 each 7   ??? topiramate (TOPAMAX) 50 MG tablet  TAKE 1 TABLET BY MOUTH 2 TIMES DAILY  5   ??? traZODone (DESYREL) 50 MG tablet Take 50 mg by mouth.     ??? VYVANSE 30 mg capsule Take 30 mg by mouth two (2) times a day.   0     No current facility-administered medications for this visit.       Allergies   Allergen Reactions   ??? Other Hives, Itching and Other (See Comments)     High fever. Hives and itching all over the body.   ??? Shellfish Containing Products Anaphylaxis   ??? Sulfasalazine Hives   ??? Toradol [Ketorolac] Hives   ??? Venom-Honey Bee Anaphylaxis   ??? Opioids - Morphine Analogues      Other reaction(s): Other  Other reaction(s): Other (See Comments)  Causes migraine  Headache   ??? Tramadol Nausea And Vomiting   ??? Hydromorphone Hcl      migraine   ??? Naproxen      Grits teeth       Patient Active Problem List   Diagnosis   ??? Connective tissue disease, undifferentiated (CMS-HCC)   ??? Tobacco abuse   ??? Fibromyalgia   ??? Hidradenitis suppurativa       Reviewed and up to date in Epic.    Appropriateness of Therapy     Acute infections noted within Epic:  No active infections  Patient reported infection: None    Is medication and dose appropriate based on diagnosis and infection status? Yes    Prescription has been clinically reviewed: Yes      Baseline Quality of Life Assessment      How many days over the past month did your undifferentiated connective tissue disease  keep you from your normal activities? For example, brushing your teeth or getting up in the morning. 0    Financial Information     Medication Assistance provided: None Required    Anticipated copay of $3 reviewed with patient. Verified delivery address.    Delivery Information     Scheduled delivery date: 05/23/2021    Expected start date: 5/24    [facility] Clinical Intervention    Type of intervention: Medication adherence    Medication involved: rasuvo/methotrexate    Problem identified: pt changing from methotrexate vials to rasuvo pens. No vials on hand at this time but dose was due to take injection today. She will be 1 day late per husband if we fill today for delivery tomorrow    Intervention performed: pt's husband is aware per notes above that if patient has missed dose by less than 2 days to take as soon as she can and go back to regular dosing. If it is more than 2 days to contact pharmacist or clinic. He states she did NOT take methotrexate today as none on hand. She will take the rasuvo tomorrow when it arrives and then go back on her Monday scheduling for next week (as won't miss dose by more than 2 days)    Follow-up needed: will followup next month    Approximate time spent: 10-15 minutes    Clinical evidence used to support intervention: Drug information resource    Thad Ranger   [facility] Specialty Pharmacist          Medication will be delivered via UPS to the prescription address in Epic [place].  This shipment will not require a signature.      Explained the services we provide  at [facility] and that each month we would call to set up refills.  Stressed importance of returning phone calls so that we could ensure they receive their medications in time each month.  Informed patient that we should be setting up refills 7-10 days prior to when they will run out of medication.  A pharmacist will reach out to perform a clinical assessment periodically.  Informed patient that a welcome packet, containing information about our pharmacy and other support services, a Notice of Privacy Practices, and a drug information handout will be sent.      The patient or caregiver noted above participated in the development of this care plan and knows that they can request review of or adjustments to the care plan at any time.      Patient or caregiver verbalized understanding of the above information as well as how to contact the pharmacy at [phone] option 4 with any questions/concerns.  The pharmacy is open Monday through Friday 8:30am-4:30pm.  A pharmacist is available 24/7 via pager to answer any clinical questions they may have.    Patient Specific Needs     - Does the patient have any physical, cognitive, or cultural barriers? No    - Patient prefers to have medications discussed with  patient or husband     - Is the patient or caregiver able to read and understand education materials at a high school level or above? No    - Patient's primary language is  English     - Is the patient high risk? Yes, patient is taking a REMS drug. Medication is dispensed in compliance with REMS program    - Does the patient require a Care Management Plan? No     - Does the patient require physician intervention or other additional services (i.e. nutrition, smoking cessation, social work)? No      Rayette Mogg A Desiree Lucy [facility] Specialty Pharmacist

## 2021-05-16 MED FILL — HUMIRA PEN CITRATE FREE 40 MG/0.4 ML: SUBCUTANEOUS | 28 days supply | Qty: 4 | Fill #4

## 2021-05-30 NOTE — Unmapped (Signed)
Laser Hair Reduction   Types of Lasers and How They Work   Lasers send a low-energy light beam through the skin that is absorbed by dark pigment (melanin) present in the shaft of the hair follicles. Since hair cycles as it grows, repeated treatments are necessary to destroy about 80% of the hairs. Different types of lasers may be used. The ruby, alexandrite, and diode were the first lasers approved for hair reduction. The intense pulsed light (IPL) system is also used. These lasers work best on light-skinned, dark-haired individuals because dark pigments in the surrounding skin cannot absorb the light they emit. Therefore, we use lasers with longer wavelengths, such as the Nd:YAG lasers, to treat darker skin types including African-american skin.   Laser Consultation   At the laser consultation, the physician will determine the appropriate laser/light source and treatment settings based on:   -Skin type (i.e. Your sensitivity to sun exposure)   -Hair color   -Thickness and location of hair   -Presence of tan (we will not apply a laser or light source to sun-tanned skin)   -Previous hair removal methods you have tried   -Medical history: ovarian or thyroid disease, medications, history of abnormal scarring, history of cold sore (herpes simplex) outbreaks in the treatment area, or past isotretinoin use   -Existence of tattoos or mole sin the treatment area   Multiple treatment sessions (typically 5-8) will be needed for long-term results and there is a potential need for maintenance treatments (patients with lightly pigmented hair as opposed to dark hairs), and the possibility of variable responses to treatment (not all patients respond to laser therapy).   Pretreatment Instructions   Before treatment, avoid tanning and sunless tanners. Broad-spectrum (UVA/UVB) sunscreens with SPF 30 or higher should be used. No plucking, waxing, or electrolysis should be done, although shaving or depilatory creams can be used. The site to be treated should be shaved one to two days prior to laser treatment. A prophylactic (protective) oral antiviral medication may be started on the day prior to treatment to suppress the possibility of developing a herpes simplex infection in the treatment area (please tell your physician if you have frequent herpes outbreaks). An oral antibiotic may be prescribed if the nasal or perianal skin is to be treated.   Laser Treatment   On the day of treatment, the area should be clean and free of cosmetics. A cooling device in the form of a cool gel or refrigerant spray will be used to lessen discomfort and protects the skin from excessive heating as well as the potential of skin darkening or lightening. Everyone in the room must wear protective eyewear during the laser procedure. Darker hair responds better than lighter hair (white, gray, or red). The laser pulses will feel like the snapping of a rubber band or warm pinpricks.   After Treatment Instructions   A small amount of swelling and redness around the hair follicles typically appears within minutes. Ice packs may be applied to the skin following treatment, and over-the-counter pain relief medicine (Tylenol) may be taken as needed. If blistering occurs, notify your doctor and a topical antibiotic ointment should be applied twice daily until healed. A mild topical steroid cream may be applied to reduce swelling and redness if requested. You should avoid sun-exposure and to use a broad-spectrum (UVA/UVB) sunscreen with SPF 30 after each laser treatment. Cosmetics may be applied to the treated skin immediately after the procedure.   Side Effects   Side effects   of laser hair removal treatments may include pain, perifollicular edema (swelling around the hair follicle), and erythema (redness and inflammation) lasting one to three days. Blistering, herpes simplex outbreaks, and bacterial infections also can occur. Temporary skin lightening or darkening, especially in darker skin types may also be seen. However, permanent skin pigment change or scarring is very rare. Loss of freckles or lightening of moles in the treatment area may occur, as can darkening or lightening of tattoos.   Efficacy   The percentage of hairs removed per session by body location, with areas of thin skin (bikini and armpits) generally showing a better response than thick skin (back and chin). Approximately 10-25 percent reduction in hair can be expected after each treatment. Treatments are repeated every four to eight weeks and hair that re-grows tends to be lighter and finer in texture.

## 2021-05-31 ENCOUNTER — Ambulatory Visit: Admit: 2021-05-31 | Discharge: 2021-06-01 | Payer: MEDICAID

## 2021-05-31 NOTE — Unmapped (Signed)
Dermatology Note     Assessment and Plan:        Hidradenitis Suppurativa, Hurley 2  -resuming laser treatment today as listed below  -continue Humira 40mg  weekly.  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     RTC: 2-3 months alexandrite laser  _________________________________________________________________      Chief Complaint     Chief Complaint   Patient presents with   ??? HS     Pt here for laser tx of axilla and groin area, also area on neck of concern, feels like a kink, occurring fo 3days       HPI     Kim Charles is a 40 y.o. female who presents as a returning patient (last seen 06/08/2020) to [facility].  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  She used hydrocortisone cream for a while and this is stable at the moment.  In 2020 she had deroofing procedures of the axilla and groin in several areas which have mostly healed well.  He does note an area of recurrent inflammation still persist in her left axilla and is somewhat tender today.  She otherwise continues on Humira and feels that the main reason her armpit started flaring up is because she missed some doses for a few months while she was going through a divorce and moving.  She does feel more stable now she is back on it and is tolerating it well.  She is hoping to resume laser treatment today since she had one in the past that seemed helpful.    Duration: 2-3 weeks   Location/pattern: sides of her nose and made worse with sun exposure, flared with in ~20 minutes of sun exposure   Symptoms: none .   New body product/laundry/household: none  New systemic medications: none  Treatments tried: none     The patient is also interested in laser treatment for the scars under her armpit.     The patient denies any other new or changing lesions or areas of concern.     Pertinent Past Medical History     No history of skin cancer    Family History:   Negative for melanoma    Past Medical History, Family History, Social History, Medication List, Allergies, and Problem List were reviewed in the rooming section of Epic.     ROS: Other than symptoms mentioned in the HPI, no fevers, chills, or other skin complaints    Physical Examination       location Abscess Inflamed nodule Non-inflamed nodule Draining sinus Non-draining Sinus Hurley % scar  R axilla          L axilla  1   1     R inframammary          L inframammary          Intermammary          Pubic          R inguinal          R thigh          L inguinal          L thigh          Scrotum/Vulva          Perianal          R buttock          L buttock          Other (list)                      AN count (total sum of abscess and inflammatory nodule):1  Pilonidal sinus? no        All areas not commented on are within normal limits or unremarkable      (Approved Template 05/13/2020)

## 2021-06-01 LAB — TB AG2: TB AG2 VALUE: 0.4

## 2021-06-01 LAB — QUANTIFERON TB GOLD PLUS
QUANTIFERON ANTIGEN 1 MINUS NIL: -0.31 [IU]/mL
QUANTIFERON ANTIGEN 2 MINUS NIL: -0.28 [IU]/mL
QUANTIFERON MITOGEN: 9.32 [IU]/mL
QUANTIFERON TB GOLD PLUS: NEGATIVE
QUANTIFERON TB NIL VALUE: 0.68 [IU]/mL

## 2021-06-01 LAB — TB AG1: TB AG1 VALUE: 0.37

## 2021-06-01 LAB — TB NIL: TB NIL VALUE: 0.68

## 2021-06-01 LAB — TB MITOGEN: TB MITOGEN VALUE: 10

## 2021-06-07 DIAGNOSIS — L732 Hidradenitis suppurativa: Principal | ICD-10-CM

## 2021-06-07 DIAGNOSIS — M359 Systemic involvement of connective tissue, unspecified: Principal | ICD-10-CM

## 2021-06-07 DIAGNOSIS — Z79899 Other long term (current) drug therapy: Principal | ICD-10-CM

## 2021-06-07 MED ORDER — HUMIRA PEN CITRATE FREE 40 MG/0.4 ML
SUBCUTANEOUS | 4 refills | 28.00000 days | Status: CP
Start: 2021-06-07 — End: ?
  Filled 2021-06-12: qty 4, 28d supply, fill #0

## 2021-06-07 NOTE — Unmapped (Signed)
Kim Charles likes the Rasuvo a lot better than the vial/syringe.     She recently moved and doesn't have access to her Humira- she missed this week's dose as a result. We'll plan to deliver next week and have her get back on track.       [facility] Clinical Assessment & Refill Coordination Note    Andrey Spearman, DOB: 04-17-1981  Phone: [phone] (home)     All above HIPAA information was verified with patient.     Was a Nurse, learning disability used for this call? No    Specialty Medication(s):   Inflammatory Disorders: Humira and Rasuvo     Current Outpatient Medications   Medication Sig Dispense Refill   ??? acyclovir (ZOVIRAX) 400 MG tablet Take 1 tablet by mouth by mouth 2 times daily for prophylaxis  6   ??? albuterol (PROVENTIL HFA;VENTOLIN HFA) 90 mcg/actuation inhaler Inhale 2 puffs daily as needed.      ??? amLODIPine (NORVASC) 5 MG tablet Take 10 mg by mouth daily.      ??? aspirin-acetaminophen-caffeine (EXCEDRIN MIGRAINE) 250-250-65 mg per tablet Take 2 tablets by mouth.     ??? beclomethasone (QVAR) 40 mcg/actuation inhaler Inhale 2 puffs daily as needed.      ??? cloNIDine HCl (CATAPRES) 0.1 MG tablet Take 0.1 mg by mouth Three (3) times a day.      ??? dexlansoprazole (DEXILANT) 60 mg capsule Take 60 mg by mouth.     ??? empty container (SHARPS CONTAINER) Misc Use as directed 1 each 2   ??? EPINEPHrine (EPIPEN) 0.3 mg/0.3 mL injection Inject 0.3 mg into the muscle.     ??? ergocalciferol-1,250 mcg, 50,000 unit, (DRISDOL) 1,250 mcg (50,000 unit) capsule Take 50,000 Units by mouth.     ??? escitalopram oxalate (LEXAPRO) 10 MG tablet Take 10 mg by mouth daily.  3   ??? fluocinonide (LIDEX) 0.05 % cream Apply 1 application topically Two (2) times a day. To rash on face. Use for up to 2 weeks at a time. Practice sun protection. 30 g 1   ??? fluticasone propionate (FLOVENT HFA) 220 mcg/actuation inhaler 2 puffs.     ??? gabapentin (NEURONTIN) 300 MG capsule Take 300 mg by mouth Three (3) times a day.     ??? HUMIRA PEN CITRATE FREE 40 MG/0.4 ML Inject the contents of 1 pen (40 mg total) under the skin every seven (7) days. 4 each 4   ??? hydroCHLOROthiazide (HYDRODIURIL) 25 MG tablet Take 200 mg by mouth.      ??? HYDROcodone-acetaminophen (NORCO 10-325) 10-325 mg per tablet Take 1 tablet by mouth 5 times daily as needed for pain     ??? hydrocortisone 2.5 % cream 1-2 times a day on the red areas on the face for 1 week straight maximum. 30 g 1   ??? hydrOXYchloroQUINE (PLAQUENIL) 200 mg tablet Take 2 tablets (400 mg total) by mouth daily. 180 tablet 3   ??? HYSINGLA ER 20 mg TP24 Take 1 tablet by mouth every 24 hours     ??? ipratropium (ATROVENT) 0.03 % nasal spray 2 sprays into each nostril.     ??? leucovorin (WELLCOVORIN) 5 mg tablet Take 3 tablets (15 mg total) by mouth daily. Take 15 mg once a week 18-24 hours after the methotrexate. 90 tablet 3   ??? lidocaine (LIDODERM) 5 % patch Apply 1 patch to skin as directed 12 hours on, 12 hours off     ??? lisinopriL (PRINIVIL,ZESTRIL) 20  MG tablet Take 1 tablet (20 mg total) by mouth daily. 30 tablet 6   ??? metFORMIN (GLUCOPHAGE) 500 MG tablet TAKE 1 TABLET BY MOUTH 2 TIMES DAILY WITH MEALS 180 tablet 3   ??? methotrexate, PF, 15 mg/0.3 mL AtIn Inject the contents of 1 pen (15 mg) under the skin every seven (7) days. 1.2 mL 11   ??? MITIGARE 0.6 mg cap capsule Take 0.6 mg by mouth daily.     ??? montelukast (SINGULAIR) 10 mg tablet Take 10 mg by mouth nightly.     ??? NARCAN 4 mg/actuation nasal spray spray (4 mg) in 1 nostril may repeat dose every 2-3 minutes as needed alternating nostrils with each dose     ??? promethazine (PHENERGAN) 25 MG tablet Take 1 tablet (25 mg total) by mouth every six (6) hours as needed for nausea. 30 tablet 3   ??? propranolol (INDERAL) 40 MG tablet Take 40 mg by mouth Two (2) times a day.      ??? spironolactone (ALDACTONE) 25 MG tablet Take 25 mg by mouth.     ??? syringe with needle (BD TUBERCULIN SYRINGE) 1 mL 25 gauge x 5/8 Syrg Use 1 syringe once a week for methotrexate injections. 50 each 7   ??? topiramate (TOPAMAX) 50 MG tablet TAKE 1 TABLET BY MOUTH 2 TIMES DAILY  5   ??? traZODone (DESYREL) 50 MG tablet Take 50 mg by mouth.     ??? VYVANSE 30 mg capsule Take 30 mg by mouth two (2) times a day.   0     No current facility-administered medications for this visit.        Changes to medications: Joyous reports no changes at this time.    Allergies   Allergen Reactions   ??? Other Hives, Itching and Other (See Comments)     High fever. Hives and itching all over the body.   ??? Shellfish Containing Products Anaphylaxis   ??? Sulfasalazine Hives   ??? Toradol [Ketorolac] Hives   ??? Venom-Honey Bee Anaphylaxis   ??? Opioids - Morphine Analogues      Other reaction(s): Other  Other reaction(s): Other (See Comments)  Causes migraine  Headache   ??? Tramadol Nausea And Vomiting   ??? Hydromorphone Hcl      migraine   ??? Naproxen      Grits teeth       Changes to allergies: No    SPECIALTY MEDICATION ADHERENCE     Humira 40/0.4 mg/ml: 0 left  Rasuvo - 2 left  Medication Adherence    Patient reported X missed doses in the last month: 1  Specialty Medication: Humira  Patient is on additional specialty medications: Yes  Additional Specialty Medications: Rasuvo  Patient Reported Additional Medication X Missed Doses in the Last Month: 0          Specialty medication(s) dose(s) confirmed: Regimen is correct and unchanged.     Are there any concerns with adherence? No    Adherence counseling provided? Not needed    CLINICAL MANAGEMENT AND INTERVENTION      Clinical Benefit Assessment:    Do you feel the medicine is effective or helping your condition? Yes    Clinical Benefit counseling provided? Not needed    Adverse Effects Assessment:    Are you experiencing any side effects? No    Are you experiencing difficulty administering your medicine? No      Acute Infection Status:    Acute infections noted within Epic:  No active infections    Patient reported infection: None    Quality of Life Assessment:    How many days over the past month did your HS  keep you from your normal activities? For example, brushing your teeth or getting up in the morning. 0    Have you discussed this with your provider? Not needed    Therapy Appropriateness:    Is therapy appropriate? Yes, therapy is appropriate and should be continued    DISEASE/MEDICATION-SPECIFIC INFORMATION      For patients on injectable medications: Patient currently has 0 Humira, 2 Rasuvo (had to leave home where Humira was) doses left.  Next injection is scheduled for Humira - 6/14, Rasuvo - 6/10.    PATIENT SPECIFIC NEEDS     - Does the patient have any physical, cognitive, or cultural barriers? No    - Is the patient high risk? No    - Does the patient require a Care Management Plan? No     - Does the patient require physician intervention or other additional services (i.e. nutrition, smoking cessation, social work)? No      SHIPPING     Specialty Medication(s) to be Shipped:   Inflammatory Disorders: Humira and methotrexate (injectable)    Other medication(s) to be shipped: leucovorin     Changes to insurance: No    Delivery Scheduled: Yes, Expected medication delivery date: Tues, 6/14.  However, Rx request for refills was sent to the provider as there are none remaining.     Medication will be delivered via UPS to the confirmed prescription address in [facility].    The patient will receive a drug information handout for each medication shipped and additional FDA Medication Guides as required.  Verified that patient has previously received a Conservation officer, historic buildings.    All of the patient's questions and concerns have been addressed.    Lanney Gins   [facility] Specialty Pharmacist

## 2021-06-07 NOTE — Unmapped (Signed)
Refilled request for Humira Pen Citrate Free 40mg /0.73ml  Last office visit 05/31/2021  Request sent for your review

## 2021-06-07 NOTE — Unmapped (Signed)
Leucovorin refill  Last Visit Date: 02/09/2021  Next Visit Date: 06/08/2021

## 2021-06-08 ENCOUNTER — Ambulatory Visit: Admit: 2021-06-08 | Discharge: 2021-06-08 | Payer: MEDICAID

## 2021-06-08 DIAGNOSIS — M3500 Sicca syndrome, unspecified: Principal | ICD-10-CM

## 2021-06-08 DIAGNOSIS — M359 Systemic involvement of connective tissue, unspecified: Principal | ICD-10-CM

## 2021-06-08 DIAGNOSIS — L732 Hidradenitis suppurativa: Principal | ICD-10-CM

## 2021-06-08 DIAGNOSIS — Z79899 Other long term (current) drug therapy: Principal | ICD-10-CM

## 2021-06-08 LAB — CBC W/ AUTO DIFF
BASOPHILS ABSOLUTE COUNT: 0.1 10*9/L (ref 0.0–0.1)
BASOPHILS RELATIVE PERCENT: 0.4 %
EOSINOPHILS ABSOLUTE COUNT: 0.1 10*9/L (ref 0.0–0.5)
EOSINOPHILS RELATIVE PERCENT: 0.4 %
HEMATOCRIT: 42.4 % (ref 34.0–44.0)
HEMOGLOBIN: 14.2 g/dL (ref 11.3–14.9)
LYMPHOCYTES ABSOLUTE COUNT: 3 10*9/L (ref 1.1–3.6)
LYMPHOCYTES RELATIVE PERCENT: 18.7 %
MEAN CORPUSCULAR HEMOGLOBIN CONC: 33.5 g/dL (ref 32.0–36.0)
MEAN CORPUSCULAR HEMOGLOBIN: 29.1 pg (ref 25.9–32.4)
MEAN CORPUSCULAR VOLUME: 87.1 fL (ref 77.6–95.7)
MEAN PLATELET VOLUME: 9.2 fL (ref 6.8–10.7)
MONOCYTES ABSOLUTE COUNT: 0.9 10*9/L — ABNORMAL HIGH (ref 0.3–0.8)
MONOCYTES RELATIVE PERCENT: 6 %
NEUTROPHILS ABSOLUTE COUNT: 11.8 10*9/L — ABNORMAL HIGH (ref 1.8–7.8)
NEUTROPHILS RELATIVE PERCENT: 74.5 %
PLATELET COUNT: 222 10*9/L (ref 150–450)
RED BLOOD CELL COUNT: 4.86 10*12/L (ref 3.95–5.13)
RED CELL DISTRIBUTION WIDTH: 13.4 % (ref 12.2–15.2)
WBC ADJUSTED: 15.8 10*9/L — ABNORMAL HIGH (ref 3.6–11.2)

## 2021-06-08 LAB — URINALYSIS WITH CULTURE REFLEX
BILIRUBIN UA: NEGATIVE
BLOOD UA: NEGATIVE
GLUCOSE UA: NEGATIVE
KETONES UA: NEGATIVE
LEUKOCYTE ESTERASE UA: NEGATIVE
NITRITE UA: NEGATIVE
PH UA: 6 (ref 5.0–9.0)
PROTEIN UA: NEGATIVE
RBC UA: 4 /HPF — ABNORMAL HIGH (ref 0–3)
SPECIFIC GRAVITY UA: 1.02 (ref 1.005–1.030)
SQUAMOUS EPITHELIAL: 50 /HPF — ABNORMAL HIGH (ref 0–5)
UROBILINOGEN UA: 0.2
WBC UA: 10 /HPF — ABNORMAL HIGH (ref 0–3)

## 2021-06-08 LAB — PROTEIN / CREATININE RATIO, URINE
CREATININE, URINE: 189.7 mg/dL
PROTEIN URINE: 23 mg/dL
PROTEIN/CREAT RATIO, URINE: 0.121

## 2021-06-08 LAB — C4 COMPLEMENT: C4 COMPLEMENT: 9 mg/dL — ABNORMAL LOW (ref 12.0–36.0)

## 2021-06-08 LAB — C3 COMPLEMENT: C3 COMPLEMENT: 69 mg/dL — ABNORMAL LOW (ref 90–170)

## 2021-06-08 LAB — CREATININE
CREATININE: 0.63 mg/dL
EGFR CKD-EPI (2021) FEMALE: >90 mL/min/{1.73_m2} (ref >=60)

## 2021-06-08 LAB — BUN: BLOOD UREA NITROGEN: 7 mg/dL — ABNORMAL LOW (ref 9–23)

## 2021-06-08 LAB — ALBUMIN: ALBUMIN: 4 g/dL (ref 3.4–5.0)

## 2021-06-08 LAB — ALT: ALT (SGPT): <7 U/L — ABNORMAL LOW (ref 10–49)

## 2021-06-08 LAB — AST: AST (SGOT): <8 U/L (ref <=34)

## 2021-06-08 MED ORDER — HYDROXYCHLOROQUINE 200 MG TABLET
ORAL_TABLET | Freq: Every day | ORAL | 3 refills | 90 days | Status: CP
Start: 2021-06-08 — End: ?
  Filled 2021-06-12: qty 28, 14d supply, fill #0

## 2021-06-08 MED ORDER — LEUCOVORIN CALCIUM 5 MG TABLET
ORAL_TABLET | ORAL | 3 refills | 0.00000 days | Status: CP
Start: 2021-06-08 — End: ?
  Filled 2021-06-12: qty 12, 28d supply, fill #0

## 2021-06-08 NOTE — Unmapped (Addendum)
REASON FOR VISIT: F/u     HISTORY: Kim Charles is a 40 y.o. female with hx of undifferentiated connective tissue disease characterized by +ANA, +SSA, photosensitivity, raynaud's, fatigue, weight loss, episodic generalized edema and polyarthralgia with swelling of both large and small joints, recurrent pneumonitis/pleuritis, malar rash, generalized weakness that is worse proximally, nausea/vomiting/abdominal bloating, mild sicca symptoms, oral ulcerations, history of recurrent miscarriages (no VTE, minimally elevated aCL IgG/IgM, neg LAC and neg B2GP1 Ab) and migraines.   She is being treated with HCQ.  MTX added in 12/2018 due to concerns for inflammatory arthritis. Changed to SQ mtx in 07/2019.   Additional PMH of childhood leukemia, hidradenitis suppurativa, asthma, OSA, fibromyalgia.     For her hidradenitis, humira was started in 04/2019. She has developed a +dsDNA since starting humira, but no clinical features of lupus. Continuing to monitor.     Interim history:   Pt presents for f/u.     She is not feeling well today.  She reports she has good days and bad days.  Review of medication compliance shows that she filled Plaquenil 90-day supplies in 09/13/2020, 02/25/2021, and none since then.      Joint pain today in knees, fingers, elbow, neck.  She continues to follow with her local pain doctor and got a steroid injection in the neck yesterday with improvement.  She feels that her lupus is flaring because of joint pains and muscle weakness, especially feels weak in the back of the calves.  Cramping in the calves frequently, worse after she takes methotrexate.  She takes gabapentin which helps with the leg cramps.    BP elevated today, she admits she did not take her BP medication because she did not eat this morning.  She will follow-up with her PCP next month for her BP.    She went to the ER in the interim for severe pelvic pain.  Was diagnosed with IBS.  She has a history of ovarian cyst and feels the pelvic pain was more likely related to an ovarian cyst.  Imaging done at the ER revealed a cyst on her right kidney incidentally, she was seen by a kidney doctor and told that the cyst was likely benign and they would just watch it.  She will see the nephrologist again in 1 year.    She contracted COVID in January, thankfully with a mild course.    Hidradenitis under control now. Had 2 laser surgeries with dermatology and continues to follow-up with dermatology..     CURRENT MEDICATIONS:  Current Outpatient Medications   Medication Sig Dispense Refill   ??? acyclovir (ZOVIRAX) 400 MG tablet Take 1 tablet by mouth by mouth 2 times daily for prophylaxis  6   ??? albuterol (PROVENTIL HFA;VENTOLIN HFA) 90 mcg/actuation inhaler Inhale 2 puffs daily as needed.      ??? amLODIPine (NORVASC) 5 MG tablet Take 10 mg by mouth daily.      ??? aspirin-acetaminophen-caffeine (EXCEDRIN MIGRAINE) 250-250-65 mg per tablet Take 2 tablets by mouth.     ??? beclomethasone (QVAR) 40 mcg/actuation inhaler Inhale 2 puffs daily as needed.      ??? cloNIDine HCl (CATAPRES) 0.1 MG tablet Take 0.1 mg by mouth Three (3) times a day.      ??? dexlansoprazole (DEXILANT) 60 mg capsule Take 60 mg by mouth.     ??? empty container (SHARPS CONTAINER) Misc Use as directed 1 each 2   ??? EPINEPHrine (EPIPEN) 0.3 mg/0.3 mL injection Inject 0.3 mg into  the muscle.     ??? ergocalciferol-1,250 mcg, 50,000 unit, (DRISDOL) 1,250 mcg (50,000 unit) capsule Take 50,000 Units by mouth.     ??? escitalopram oxalate (LEXAPRO) 10 MG tablet Take 10 mg by mouth daily.  3   ??? fluocinonide (LIDEX) 0.05 % cream Apply 1 application topically Two (2) times a day. To rash on face. Use for up to 2 weeks at a time. Practice sun protection. 30 g 1   ??? fluticasone propionate (FLOVENT HFA) 220 mcg/actuation inhaler 2 puffs.     ??? gabapentin (NEURONTIN) 300 MG capsule Take 300 mg by mouth Three (3) times a day.     ??? HUMIRA PEN CITRATE FREE 40 MG/0.4 ML Inject the contents of 1 pen (40 mg total) under the skin every seven (7) days. 4 each 4   ??? hydroCHLOROthiazide (HYDRODIURIL) 25 MG tablet Take 200 mg by mouth.      ??? HYDROcodone-acetaminophen (NORCO 10-325) 10-325 mg per tablet Take 1 tablet by mouth 5 times daily as needed for pain     ??? hydrocortisone 2.5 % cream 1-2 times a day on the red areas on the face for 1 week straight maximum. 30 g 1   ??? hydrOXYchloroQUINE (PLAQUENIL) 200 mg tablet Take 2 tablets (400 mg total) by mouth daily. 180 tablet 3   ??? HYSINGLA ER 20 mg TP24 Take 1 tablet by mouth every 24 hours     ??? ipratropium (ATROVENT) 0.03 % nasal spray 2 sprays into each nostril.     ??? leuCOVorin (WELLCOVORIN) 5 mg tablet Take 15 mg once a week 18-24 hours after the methotrexate 90 tablet 3   ??? lidocaine (LIDODERM) 5 % patch Apply 1 patch to skin as directed 12 hours on, 12 hours off     ??? lisinopriL (PRINIVIL,ZESTRIL) 20 MG tablet Take 1 tablet (20 mg total) by mouth daily. 30 tablet 6   ??? metFORMIN (GLUCOPHAGE) 500 MG tablet TAKE 1 TABLET BY MOUTH 2 TIMES DAILY WITH MEALS 180 tablet 3   ??? methotrexate, PF, 15 mg/0.3 mL AtIn Inject the contents of 1 pen (15 mg) under the skin every seven (7) days. 1.2 mL 11   ??? MITIGARE 0.6 mg cap capsule Take 0.6 mg by mouth daily.     ??? montelukast (SINGULAIR) 10 mg tablet Take 10 mg by mouth nightly.     ??? NARCAN 4 mg/actuation nasal spray spray (4 mg) in 1 nostril may repeat dose every 2-3 minutes as needed alternating nostrils with each dose     ??? promethazine (PHENERGAN) 25 MG tablet Take 1 tablet (25 mg total) by mouth every six (6) hours as needed for nausea. 30 tablet 3   ??? propranolol (INDERAL) 40 MG tablet Take 40 mg by mouth Two (2) times a day.      ??? spironolactone (ALDACTONE) 25 MG tablet Take 25 mg by mouth.     ??? syringe with needle (BD TUBERCULIN SYRINGE) 1 mL 25 gauge x 5/8 Syrg Use 1 syringe once a week for methotrexate injections. 50 each 7   ??? topiramate (TOPAMAX) 50 MG tablet TAKE 1 TABLET BY MOUTH 2 TIMES DAILY  5   ??? traZODone (DESYREL) 50 MG tablet Take 50 mg by mouth.     ??? VYVANSE 30 mg capsule Take 30 mg by mouth two (2) times a day.   0     No current facility-administered medications for this visit.       Past Medical History:  Diagnosis Date   ??? ADHD (attention deficit hyperactivity disorder)    ??? Allergic 2007    Allergic to shrimp   ??? Anemia    ??? Asthma    ??? Chronic pain    ??? Disorder of skin or subcutaneous tissue 2001    Hidradenitis suprative   ??? Fibromyalgia    ??? Hidradenitis suppurativa    ??? Hypertension    ??? Leukemia (CMS-HCC)    ??? Lung disease 2017    COPD   ??? Lupus (systemic lupus erythematosus) (CMS-HCC)    ??? Migraines    ??? Osteoarthritis    ??? Scoliosis         Record Review: Available records were reviewed, including pertinent office visits, labs, and imaging.      REVIEW OF SYSTEMS: Ten system were reviewed and negative except as noted above.    PHYSICAL EXAM:  Vitals:    06/08/21 1328   BP: 147/103   BP Site: L Arm   BP Position: Sitting   BP Cuff Size: Large   Pulse: 78   Temp: 36.7 ??C (98.1 ??F)   TempSrc: Temporal   Weight: 74.1 kg (163 lb 6.4 oz)      General:   Pleasant 39 y.o.female in no acute distress, WDWN   Cardiovascular:  Regular rate and rhythm. No murmur, rub, or gallop. No lower extremity edema.    Lungs:  Clear to auscultation.Normal respiratory effort.    Musculoskeletal:   General: Ambulates w/o assistance   Hands: No swelling bilaterally.  Tenderness of all PIPs and DIPs on the right, tenderness of all PIPs on the left.  Wrists:FROM w/o swelling or tenderness   Elbows: FROM w/o swelling or tenderness   Shoulders: FROM bilaterally, painful range of motion on the left.  Hips: FROM w/o pain   Knees: FROM w/o effusions   Ankles: Cool effusions bilaterally.  Feet: No pain with MTP squeeze    Neurological:  CN 2-12 grossly intact. 5/5 strength in upper and lower extremities, proximally and distally.   Psych:  Appropriate affect and mood   Skin:  No rashes.        ASSESSMENT/PLAN:  1. Sjogren's syndrome vs UCTD   Reporting worsening arthralgias and weakness, but strength on exam is good.  No clear synovitis on exam.  Encouraged better compliance to medications.  Continue Plaquenil 400 mg daily, methotrexate 15 mg subcu weekly, leucovorin 15 mg a day after methotrexate.  Check labs below for disease activity and medication toxicity.    - hydrOXYchloroQUINE (PLAQUENIL) 200 mg tablet; Take 2 tablets (400 mg total) by mouth in the morning.  Dispense: 180 tablet; Refill: 3  - Ambulatory referral to Ophthalmology; Future  - Anti-DNA antibody, double-stranded  - BUN  - C3 complement  - C4 complement  - CBC w/ Differential  - Creatinine  - Protein/Creatinine Ratio, Urine  - Urinalysis with Culture Reflex  - Albumin  - ALT  - AST  - Urine Culture    2. Hidradenitis suppurativa  Continue follow-up with dermatology, continue Humira per their recommendations.    3. Fibromyalgia  Symptomatic but stable.  Continue follow-up with pain clinic.      HCM:   - PCV13 Status: Consider rendering in the future.   - PPSV 23 Status:01/19/15  - Annual Influenza vaccine. Status:  Did not discuss  -COVID-19 vaccine: Pfizer 02/20/2020, 03/31/2020, 02/09/2021.  Moderna 06/08/2021.  Skip methotrexate x1 week after dose today.  - Bone health: not on prednisone   -  Plaquenil eye exam:  Referral to [facility] ophthalmology to update Plaquenil eye exams  - Contraception: s/p hysterectomy       Return to clinic as scheduled in 4 months with Kim Charles   Greater than 30 minutes spent in visit with patient, including pre and postvisit activities.

## 2021-06-08 NOTE — Unmapped (Signed)
Covid vaccine 4th dose today. Skip methotrexate this Friday, and take it next Friday again.

## 2021-06-12 MED FILL — RASUVO (PF) 15 MG/0.3 ML SUBCUTANEOUS AUTO-INJECTOR: SUBCUTANEOUS | 28 days supply | Qty: 1.2 | Fill #1

## 2021-06-13 LAB — ANTI-DNA ANTIBODY, DOUBLE-STRANDED: DSDNA ANTIBODY: NEGATIVE

## 2021-06-30 NOTE — Unmapped (Signed)
[facility] Refill Coordination Note    Specialty Medication(s) to be Shipped:   Inflammatory Disorders: Humira and Rasuvo    Other medication(s) to be shipped: Leucovorin and Hydroxychloroquine     Lindee Breon, DOB: 02/11/1981  Phone: [phone] (home)       All above HIPAA information was verified with patient.     Was a Nurse, learning disability used for this call? No    Completed refill call assessment today to schedule patient's medication shipment from the [facility] [phone]).  All relevant notes have been reviewed.     Specialty medication(s) and dose(s) confirmed: Regimen is correct and unchanged.   Changes to medications: Melanee reports no changes at this time.  Changes to insurance: No  New side effects reported not previously addressed with a pharmacist or physician: None reported  Questions for the pharmacist: No    Confirmed patient received a Conservation officer, historic buildings and a Surveyor, mining with first shipment. The patient will receive a drug information handout for each medication shipped and additional FDA Medication Guides as required.       DISEASE/MEDICATION-SPECIFIC INFORMATION        For patients on injectable medications: Patient currently has 1 dose of Humira and 2 doses of Rasuvo doses left.  Next injection is scheduled for 07/04/2021 for Humira and 07/07/2021 for Rasuvo.    SPECIALTY MEDICATION ADHERENCE     Medication Adherence    Patient reported X missed doses in the last month: 0  Specialty Medication: Humira CF 40 mg/0.4 ml  Patient is on additional specialty medications: Yes  Additional Specialty Medications: RASUVO (PF) 15 mg/0.3 mL  Patient Reported Additional Medication X Missed Doses in the Last Month: 0  Patient is on more than two specialty medications: No  Any gaps in refill history greater than 2 weeks in the last 3 months: no  Demonstrates understanding of importance of adherence: yes  Informant: patient  Reliability of informant: reliable  Confirmed plan for next specialty medication refill: delivery by pharmacy  Refills needed for supportive medications: not needed              Were doses missed due to medication being on hold? No    Humira CF 40 mg/0.4 ml : 7 days of medicine on hand  RASUVO (PF) 15 mg/0.3 mL :  14 days of medication on hand.        REFERRAL TO PHARMACIST     Referral to the pharmacist: Not needed      [facility]     Shipping address confirmed in Epic.     Delivery Scheduled: Yes, Expected medication delivery date: 07/06/2021.     Medication will be delivered via UPS to the prescription address in Epic WAM.    Lezly Rumpf D Aleya Durnell   [facility] Specialty Technician

## 2021-07-05 MED FILL — HYDROXYCHLOROQUINE 200 MG TABLET: ORAL | 30 days supply | Qty: 60 | Fill #1

## 2021-07-05 MED FILL — HUMIRA PEN CITRATE FREE 40 MG/0.4 ML: SUBCUTANEOUS | 28 days supply | Qty: 4 | Fill #1

## 2021-07-05 MED FILL — LEUCOVORIN CALCIUM 5 MG TABLET: 28 days supply | Qty: 12 | Fill #1

## 2021-07-05 MED FILL — RASUVO (PF) 15 MG/0.3 ML SUBCUTANEOUS AUTO-INJECTOR: SUBCUTANEOUS | 28 days supply | Qty: 1.2 | Fill #2

## 2021-07-26 NOTE — Unmapped (Signed)
[facility] Refill Coordination Note    Specialty Medication(s) to be Shipped:   Inflammatory Disorders: Humira and Rasuvo    Other medication(s) to be shipped: Promethazine, Leucovorin and Hydroxychloroquine     Kim Charles, DOB: 1981-12-19  Phone: [phone] (home)       All above HIPAA information was verified with patient.     Was a Nurse, learning disability used for this call? No    Completed refill call assessment today to schedule patient's medication shipment from the [facility] [phone]).  All relevant notes have been reviewed.     Specialty medication(s) and dose(s) confirmed: Regimen is correct and unchanged.   Changes to medications: Keilana reports no changes at this time.  Changes to insurance: No  New side effects reported not previously addressed with a pharmacist or physician: None reported  Questions for the pharmacist: No    Confirmed patient received a Conservation officer, historic buildings and a Surveyor, mining with first shipment. The patient will receive a drug information handout for each medication shipped and additional FDA Medication Guides as required.       DISEASE/MEDICATION-SPECIFIC INFORMATION        For patients on injectable medications: Patient currently has 1 dose of Humira and 3 doses of Rasuvo doses left.  Next injection is scheduled for 08/01/2021 for Humira and 07/28/2021 for Rasuvo.    SPECIALTY MEDICATION ADHERENCE     Medication Adherence    Specialty Medication: Humira CF 40 mg/0.4 ml  Patient is on additional specialty medications: Yes  Additional Specialty Medications: RASUVO (PF) 15 mg/0.3 mL   Patient Reported Additional Medication X Missed Doses in the Last Month: 0  Patient is on more than two specialty medications: No  Any gaps in refill history greater than 2 weeks in the last 3 months: no  Demonstrates understanding of importance of adherence: yes  Informant: patient  Reliability of informant: reliable  Confirmed plan for next specialty medication refill: delivery by pharmacy  Refills needed for supportive medications: not needed              Were doses missed due to medication being on hold? No    Humira CF 40 mg/0.4 ml : 7 days of medicine on hand  RASUVO (PF) 15 mg/0.3 mL :  21 days of medication on hand.        REFERRAL TO PHARMACIST     Referral to the pharmacist: Not needed      [facility]     Shipping address confirmed in Epic.     Delivery Scheduled: Yes, Expected medication delivery date: 08/01/2021.     Medication will be delivered via UPS to the prescription address in Epic WAM.    Allison Deshotels D Bindi Klomp   [facility] Specialty Technician

## 2021-07-31 MED FILL — HYDROXYCHLOROQUINE 200 MG TABLET: ORAL | 30 days supply | Qty: 60 | Fill #2

## 2021-07-31 MED FILL — LEUCOVORIN CALCIUM 5 MG TABLET: 28 days supply | Qty: 12 | Fill #2

## 2021-07-31 MED FILL — PROMETHAZINE 25 MG TABLET: ORAL | 8 days supply | Qty: 30 | Fill #2

## 2021-07-31 MED FILL — HUMIRA PEN CITRATE FREE 40 MG/0.4 ML: SUBCUTANEOUS | 28 days supply | Qty: 4 | Fill #2

## 2021-07-31 MED FILL — RASUVO (PF) 15 MG/0.3 ML SUBCUTANEOUS AUTO-INJECTOR: SUBCUTANEOUS | 28 days supply | Qty: 1.2 | Fill #3

## 2021-08-04 ENCOUNTER — Ambulatory Visit: Admit: 2021-08-04 | Discharge: 2021-08-05 | Payer: MEDICAID | Attending: Retina Specialist | Primary: Retina Specialist

## 2021-08-04 DIAGNOSIS — Z79899 Other long term (current) drug therapy: Principal | ICD-10-CM

## 2021-08-04 DIAGNOSIS — M359 Systemic involvement of connective tissue, unspecified: Principal | ICD-10-CM

## 2021-08-04 DIAGNOSIS — T378X5A Adverse effect of other specified systemic anti-infectives and antiparasitics, initial encounter: Principal | ICD-10-CM

## 2021-08-04 NOTE — Unmapped (Signed)
[name] is here for screening for Plaquenil retinopathy, referred by Magdalene Molly, FNP.    Assessment:         1. Long-term use of plaquenil for lupus  No evidence of plaquenil toxicity    Duration of use: 6 years,  400mg   daily PO. This is equivalent to 5.4 mg/kg/day.    Risk factors: No liver disease. No renal dysfunction. No history of use of tamoxifen    I reviewed the patient's current tests to see if there were signs of retinal toxicity.  The tests done provide non-duplicative information to detect signs of retinal toxicity in terms of retinal structure and function.    Clinical findings: Within normal limits     Fundus autofluorescence (FAF):  Within normal limits    Spectral domain optical coherence tomography (SD-OCT): Within normal limits   Humphrey visual field 10-2 :Within normal limits     Overall, my evaluation today indicates that the patient has no retinal toxicity due to plaquenil.    2.Myopic astigmatism - seeing well with current glasses            Plan:       I recommend that we see the patient again in 1 year to repeat the evaluation with clinical exam, FAF, SD-OCT.  No follow-ups on file.  or sooner PRN.

## 2021-08-16 DIAGNOSIS — L732 Hidradenitis suppurativa: Principal | ICD-10-CM

## 2021-08-16 MED ORDER — CLINDAMYCIN HCL 300 MG CAPSULE
ORAL_CAPSULE | 2 refills | 0.00000 days | Status: CP
Start: 2021-08-16 — End: ?

## 2021-08-22 NOTE — Unmapped (Signed)
[facility] Refill Coordination Note    Specialty Medication(s) to be Shipped:   Inflammatory Disorders: Humira and Rasuvo    Other medication(s) to be shipped: hydroxychloroquine, promethazine and leucovorin     Kim Charles, DOB: Nov 21, 1981  Phone: [phone] (home)       All above HIPAA information was verified with patient.     Was a Nurse, learning disability used for this call? No    Completed refill call assessment today to schedule patient's medication shipment from the [facility] [phone]).  All relevant notes have been reviewed.     Specialty medication(s) and dose(s) confirmed: Regimen is correct and unchanged.   Changes to medications: Kim Charles reports no changes at this time.  Changes to insurance: No  New side effects reported not previously addressed with a pharmacist or physician: None reported  Questions for the pharmacist: No    Confirmed patient received a Conservation officer, historic buildings and a Surveyor, mining with first shipment. The patient will receive a drug information handout for each medication shipped and additional FDA Medication Guides as required.       DISEASE/MEDICATION-SPECIFIC INFORMATION        For patients on injectable medications: Patient currently has 1 doses left.  Next injection is scheduled for 08/25/2021. **For Both**    SPECIALTY MEDICATION ADHERENCE     Medication Adherence    Patient reported X missed doses in the last month: 0  Specialty Medication: Humira CF 40 mg/0.4 ml  Patient is on additional specialty medications: Yes  Additional Specialty Medications: RASUVO (PF) 15 mg/0.3 mL  Patient Reported Additional Medication X Missed Doses in the Last Month: 0  Patient is on more than two specialty medications: No        Were doses missed due to medication being on hold? No    REFERRAL TO PHARMACIST     Referral to the pharmacist: Not needed      [facility]     Shipping address confirmed in Epic.     Delivery Scheduled: Yes, Expected medication delivery date: 08/30/2021.     Medication will be delivered via UPS to the prescription address in Epic WAM.    Kim Charles

## 2021-08-29 MED FILL — RASUVO (PF) 15 MG/0.3 ML SUBCUTANEOUS AUTO-INJECTOR: SUBCUTANEOUS | 28 days supply | Qty: 1.2 | Fill #4

## 2021-08-29 MED FILL — HUMIRA PEN CITRATE FREE 40 MG/0.4 ML: SUBCUTANEOUS | 28 days supply | Qty: 4 | Fill #3

## 2021-08-29 MED FILL — LEUCOVORIN CALCIUM 5 MG TABLET: 28 days supply | Qty: 12 | Fill #3

## 2021-08-29 MED FILL — HYDROXYCHLOROQUINE 200 MG TABLET: ORAL | 30 days supply | Qty: 60 | Fill #3

## 2021-08-29 MED FILL — PROMETHAZINE 25 MG TABLET: ORAL | 8 days supply | Qty: 30 | Fill #3

## 2021-09-18 DIAGNOSIS — Z79899 Other long term (current) drug therapy: Principal | ICD-10-CM

## 2021-09-18 DIAGNOSIS — R110 Nausea: Principal | ICD-10-CM

## 2021-09-18 MED ORDER — PROMETHAZINE 25 MG TABLET
ORAL_TABLET | Freq: Four times a day (QID) | ORAL | 3 refills | 8 days | Status: CP | PRN
Start: 2021-09-18 — End: ?
  Filled 2021-09-20: qty 30, 8d supply, fill #0

## 2021-09-18 NOTE — Unmapped (Signed)
[facility] Refill Coordination Note    Specialty Medication(s) to be Shipped:   Inflammatory Disorders: Humira and Rasuvo    Other medication(s) to be shipped: hydrOXYchloroQUINE 200 mg tablet (PLAQUENIL) - leuCOVorin 5 mg tablet (WELLCOVORIN)  -  promethazine 25 MG tablet (PHENERGAN) -      Kim Charles, DOB: April 16, 1981  Phone: [phone] (home)       All above HIPAA information was verified with patient.     Was a Nurse, learning disability used for this call? No    Completed refill call assessment today to schedule patient's medication shipment from the [facility] [phone]).  All relevant notes have been reviewed.     Specialty medication(s) and dose(s) confirmed: Regimen is correct and unchanged.   Changes to medications: Kim Charles reports no changes at this time.  Changes to insurance: No  New side effects reported not previously addressed with a pharmacist or physician: None reported  Questions for the pharmacist: No    Confirmed patient received a Conservation officer, historic buildings and a Surveyor, mining with first shipment. The patient will receive a drug information handout for each medication shipped and additional FDA Medication Guides as required.       DISEASE/MEDICATION-SPECIFIC INFORMATION        For patients on injectable medications: Patient currently has 1 Humira & 0 Rasuvo doses left.  Next injection is scheduled for 09/23/21 & 09/23/21.    SPECIALTY MEDICATION ADHERENCE     Medication Adherence    Patient reported X missed doses in the last month: 0  Specialty Medication: Humira CF 40 mg/0.4 ml  Patient is on additional specialty medications: Yes  Additional Specialty Medications: RASUVO (PF) 15 mg/0.3 mL  Patient Reported Additional Medication X Missed Doses in the Last Month: 0  Patient is on more than two specialty medications: No  Informant: patient  Reliability of informant: reliable  Confirmed plan for next specialty medication refill: delivery by pharmacy  Refills needed for supportive medications: yes, ordered or provider notified              Were doses missed due to medication being on hold? No    HUMIRA(CF) PEN 40 mg/0.4 mL: 7 days of medicine on hand   RASUVO (PF) 15 mg/0.3 mL: 0 days of medicine on hand       REFERRAL TO PHARMACIST     Referral to the pharmacist: Not needed      [facility]     Shipping address confirmed in Epic.     Delivery Scheduled: Yes, Expected medication delivery date: 09/21/21.  However, Rx request for refills was sent to the provider as there are none remaining.     Medication will be delivered via UPS to the prescription address in Epic WAM.    Kim Charles   [facility] Specialty Technician

## 2021-09-18 NOTE — Unmapped (Signed)
Promethazine refill  Last ov: 06/08/2021  Next ov: 10/18/2021

## 2021-09-20 MED FILL — HUMIRA PEN CITRATE FREE 40 MG/0.4 ML: SUBCUTANEOUS | 28 days supply | Qty: 4 | Fill #4

## 2021-09-20 MED FILL — LEUCOVORIN CALCIUM 5 MG TABLET: 28 days supply | Qty: 12 | Fill #4

## 2021-09-20 MED FILL — HYDROXYCHLOROQUINE 200 MG TABLET: ORAL | 30 days supply | Qty: 60 | Fill #4

## 2021-09-20 MED FILL — RASUVO (PF) 15 MG/0.3 ML SUBCUTANEOUS AUTO-INJECTOR: SUBCUTANEOUS | 28 days supply | Qty: 1.2 | Fill #5

## 2021-09-29 MED ORDER — RIFAMPIN 300 MG CAPSULE
ORAL_CAPSULE | ORAL | 2 refills | 0.00000 days | Status: CP
Start: 2021-09-29 — End: ?

## 2021-09-29 MED ORDER — DOXYCYCLINE HYCLATE 100 MG TABLET
ORAL_TABLET | 3 refills | 0.00000 days | Status: CP
Start: 2021-09-29 — End: ?

## 2021-10-04 ENCOUNTER — Institutional Professional Consult (permissible substitution): Admit: 2021-10-04 | Payer: MEDICAID

## 2021-10-04 ENCOUNTER — Ambulatory Visit: Admit: 2021-10-04 | Payer: MEDICAID

## 2021-10-04 DIAGNOSIS — M25511 Pain in right shoulder: Principal | ICD-10-CM

## 2021-10-04 NOTE — Unmapped (Addendum)
Shoulder Abduction Wall Stretch - 3x, hold 30 seconds  Stand sideways to wall with hand or side of hand on wall, overhead. Bend down, sideways, toward wall to induce a stretch in the shoulder. Do not go to pain.        SCAPULAR RETRACTIONS - 20x  Move your shoulder blades back and down. Hold, relax and repeat.       SHRUGS - 20x  Raise your shoulders upward towards your ears as shown. Shrug both shoulders at the same time.      UPPER TRAP STRETCH - HAND BEHIND BACK AND TOP OF HEAD - 3x on each side, hold 30 seconds each   Begin by retracting your head back into a chin tuck position. Next, place one hand behind your back and gently pull your head towards the opposite side with the help of your other arm.       ELASTIC BAND BILATERAL EXTERNAL ROTATION - ER - 20x  While holding an elastic band with your elbows bent, pull your hands away from your stomach area. Keep  your elbows near the side of your body.      CERVICAL RETRACTION / CHIN TUCK - 30x, hold 3 seconds  Slowly draw your head back so that your ears line up with your shoulders.

## 2021-10-04 NOTE — Unmapped (Signed)
[facility] [place]  OUTPATIENT PHYSICAL THERAPY  10/04/2021  Note Type: Evaluation       Patient Name: Kim Charles  Date of Birth:03/25/1981  Diagnosis:   Encounter Diagnosis   Name Primary?   ??? Right shoulder pain, unspecified chronicity Yes     Referring MD:  Shara Blazing, PA     Date of Onset of Impairment-05/07/2018  Date PT Care Plan Established or Reviewed-10/04/2021  Date PT Treatment Started-10/04/2021   Plan of Care Effective Date:     PRECAUTIONS: cardiac, hx of cancer, RA, Lupus    Assessment/Plan:    Assessment details:       Clinical presentation indicates signs/ symptoms consistent with cervical strain and possible RTC impairment and/or bursitis. Limitations include: neck pain, shoulder pain, decreased active shoulder and cervical ROM and strength, edema, impaired scapulohumeral rhythm, decreased neuromuscular awareness, decreased cervical joint mobility, positive special tests, and altered functional status as noted on initial evaluation. Spasm and increased tone is present in the associated soft tissues. These impairments restrict the patient's functional ADLs, making skilled PT medically necessary to return patient to full function at an optimal state.  Impairments: decreased mobility, impaired ADLs, pain, postural weakness, decreased range of motion, decreased strength, core weakness, decreased endurance, muscular restrictions, trigger points and impaired flexibility      Specific Comorbidities: see patient history    Body System: musculoskeletal and neuromuscular    Clinical Decision Making: moderate  Prognosis: good prognosis      Therapy Goals  Goals:      Treatment Goals: (to be achieved in 4 weeks)   1) Patient will be able to perform ADLS with max pain of 1-2/10 to facilitate return to PLOF  2) improve affected shoulder AROM to WNLs to perform all adls without pain  3) improve strength of affected arm to 5/5  to perform housekeeping  4) improve cervical ROM to WNLs for looking up during overhead activities  5) independence in HEP to achieve the above given goals.        Plan  Therapy options: will be seen for skilled physical therapy services  Planned therapy interventions: Ultrasound, Dry Needling, Education - Patient, Functional Mobility, Home Exercise Program, Manual Therapy, Neuromuscular Re-education, Postural Training, Therapeutic Activities, Therapeutic Exercises, Taping, E-Stim, Cryotherapy, Endurance Activites and Iontophoresis    Frequency: 2x week  Duration in weeks: 4 weeks  Education provided to: patient.  Education provided: HEP, Treatment options and plan, Symptom management, Anatomy and Posture  Education results: verbalized good understanding.  Communication/Consultation: Initial note sent to Referring Provider.    Total Session Time: 44    Plan details:      Per poc      Subjective:   History of Present Illness      Date of Evaluation: 10/04/2021    Reason for Referral/Chief Complaint:       Right shoulder pain  Subjective:     History of Injury: Kim Charles is a 40 y.o. female who presents with complaints of right shoulder pain since 05/07/2018 when she obtained an injury from a bulldog attacking her. Reports the dog bit her in 3 different areas on right UE with her shoulder being one. Denies previous surgeries to right shoulder and neck. Reports N/T down right UE into fingertips. Reports she also has Lupus and RA. Reports weakness in hand and dropping objects. Reports shoulder popping as well with specific shoulder movements - brushing her hair and bathing. States she had imaging done  from doctor in [place] that showed OA and possible RTC issue.         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; pt on methotrexate for Lupus; RA)  Social Support  Hand dominance: right  Communication Preference: verbal, written and visual  Barriers to Learning: No Barriers      Treatments  Previous treatment: corticosteroid injection  Current treatment: physical therapy      Patient Goals  Patient goals for therapy: decrease assistance with ADLs, decreased pain, increased ROM, increased strength, independence with ADLs/IADLs, improve independence with dressing and improve overhead reaching        Objective:   Objective    Palpation:TTP left UT, left deltoid & right UT, infraspinatus, clavicle, and surrounding right shoulder    Posture:FHP and rounded shoulders ; left shoulder superior to right; pt reports scoliosis in low back    Cervical Clearing Exam:repeated movements of cervical spine cause neck pain    Cervical ROM (% of movement)    Cervical ROM ROM       Flexion    WNL   Extension 34*   Retraction    25%   Protrusion 50%   Right side flexion 38*   Left side flexion 30*   Right rotation WNL   Left rotation WNL      ROM: (degrees)    RIGHT  LEFT    SHOULDER AROM PROM AROM PROM               Flexion WNL; pain at end-range & tingling in pinky WNL WNL WNL        Abduction 123 WNL WNL WNL        Extension WNL WNL WNL WNL        IR T9 -- T9 --        ER T4 -- T4 --          ELBOW                   Flexion WNL WNL WNL WNL        Extension WNL WNL WNL WNL              Strength: pain with all    RIGHT LEFT   SHOULDER          FLEXION  5-   5        ABDUCTION 5- 5        EXTENSION 5 5        IR 5- 5        ER 5- 5        ELBOW          FLEXION 5- 5        EXTENSION 5 5            Joint mobility:normal GH mobility in all directions      Special tests: RUE   Neers sign (SA impingement) - POS   Hawkins test (Supraspinatous impingement) - POS   Drop arm test (rotator cuff tear) - NEG   Speed's test (  biceps tendon instability or tendonitis) - weakness and pain   O'Brien test - POS   Jerk test - NEG   AC crossover test - POS   Painful arc - NEG   Empty can - POS   Full can - NEG       Treatment:   HEP instructions (see wrap-up); education regarding eval findings, posture, anatomy, and treatment plan - not billable d/t insurance  Mod complexity eval - 30 mins    PT Evaluation Charges  $$ PT Evaluation - MOD Complexity [mins]: 30     Therapeutic Interventions Charges  $$ Therapeutic Exercise [mins]: 8                 I attest that I have reviewed the above information.  SignedTana Coast, PT  10/04/2021 4:06 PM

## 2021-10-10 ENCOUNTER — Ambulatory Visit: Admit: 2021-10-10 | Discharge: 2021-10-11 | Payer: MEDICAID

## 2021-10-10 MED ORDER — RIFAMPIN 300 MG CAPSULE
ORAL_CAPSULE | ORAL | 2 refills | 0.00000 days | Status: CP
Start: 2021-10-10 — End: ?

## 2021-10-10 MED ORDER — DOXYCYCLINE HYCLATE 100 MG TABLET
ORAL_TABLET | 3 refills | 0.00000 days | Status: CP
Start: 2021-10-10 — End: ?

## 2021-10-10 NOTE — Unmapped (Signed)
Dermatology Note     Assessment and Plan:      Hidradenitis suppurativa (HS), Hurley stage 2  chronic with exacerbation or progression  -Locations involved: axillae, groin, inframammary.  -Reviewed chronic, relapsing nature of disease and treatment options, including antibiotics, immune modulators, surgery, and laser hair removal.  - Continue humira 40 mg weekly  - Continue rifampin/doxycycline for additional 2 months. Discussed unroofing for inflammatory lesions, but declines at this time. 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  Reviewed small but present risk of SCC developing within lesions.  - Reviewed treatment options including cryotherapy vs. Imiquimod (Aldara) vs shave removal     Procedure note:  After discussion of risks, benefits, alternatives, including but not limited to scar, post-inflammatory pigment changes, infection, blister, pain, recurrence, and necessity of further treatment, patient agreed to treatment and verbal consent was obtained. Applied liquid nitrogen x 3 seconds for 2 freeze-thaw cycle to 1 lesions.  The  patient tolerated the procedure well and was instructed on post-procedure care.    The patient was advised to call for an appointment should any new, changing, or symptomatic lesions develop.     RTC: Return in about 3 months (around 01/10/2022) for hs laser. or sooner as needed     Chief Complaint     Chief Complaint   Patient presents with   ??? Skin Check     Hidradenitis suppurativa   ??? Laser Treatment     Groin area       HPI     Kim Charles is a 40 y.o. female who presents as a returning patient (last seen 05/31/2021) to [facility] for follow up of HS. Has overall been under OK control, taking 40 mg weekly of humira. Recently had a flare at the end of August, sent MyChart and started doxycycline/rifampin which was helpful. One of the lesions started draining which provided relief.    Denies any other skin concerns, including no other areas with bleeding, drainage, pain, pruritus, growth, or change.     Pertinent Past Medical History     Past Medical History, Family History, Social History, Medication List, Allergies, and Problem List were reviewed in the rooming section of Epic.     ROS: Other than symptoms mentioned in the HPI, no fevers, chills, or other skin complaints    Physical Examination     GENERAL: Well-appearing female in no acute distress, resting comfortably.  NEURO: Alert and oriented, answers questions appropriately  PSYCH: Normal mood and affect  RESP: No increased work of breathing    location Abscess Inflamed nodule Non-inflamed nodule Draining sinus Non-draining Sinus Hurley % scar   R axilla ?? 2?? ?? ?? ?? ?? ??   L axilla ?? 1 ?? ?? 1 ?? ??   R inframammary ?? ?? ?? ?? ?? ?? ??   L inframammary ?? ?? ?? ?? ?? ?? ??   Intermammary ?? ??1 ?? ?? ?? ?? ??  Pubic ?? ?? ?? ?? ?? ?? ??   R inguinal ?? ?? ?? ?? ?? ?? ??   R thigh ?? ?? ?? ?? ?? ?? ??   L inguinal ?? ?? ?? ?? ?? ?? ??   L thigh ?? ?? ?? ?? ?? ?? ??   Scrotum/Vulva ?? ?? ?? ?? ?? ?? ??   Perianal ?? ?? ?? ?? ?? ?? ??   R buttock ?? ?? ?? ?? ?? ?? ??   L buttock ?? ?? ?? ?? ?? ?? ??   Other (list) ?? ?? ?? ?? ?? ?? ??   ?? ?? ?? ?? ?? ?? ?? ??   ??  AN count (total sum of abscess and inflammatory nodule):4  Pilonidal sinus? no    All areas not commented on are within normal limits or unremarkable

## 2021-10-10 NOTE — Unmapped (Signed)
If you would like to get into contact with your dermatologist, please call our office at [phone] or, for non-urgent questions, send a MyChart message to the attending physician for the visit. Any message sent to the visit's attending physician on MyChart will be routed to the resident physician who participated in your care. Please allow 3 business days for response via MyChart.

## 2021-10-11 DIAGNOSIS — M25511 Pain in right shoulder: Principal | ICD-10-CM

## 2021-10-11 NOTE — Unmapped (Signed)
[facility] PT [place]  OUTPATIENT PHYSICAL THERAPY  10/11/2021  Note Type: Treatment Note       Patient Name: Kim Charles  Date of Birth:1981/08/23  Diagnosis:   Encounter Diagnosis   Name Primary?   ??? Right shoulder pain, unspecified chronicity Yes     Referring MD:  Shara Blazing, PA     Date of Onset of Impairment-05/07/2018  Date PT Care Plan Established or Reviewed-10/04/2021  Date PT Treatment Started-10/04/2021   Plan of Care Effective Date: 10/04/2021 - 11/01/2021    PRECAUTIONS: Lupas, RA, cardiac,    Assessment/Plan:    Assessment details:       Pt first session since IE. Pt treatment focus was increase ROM, strength, and pain management with HEP. Pt tolerated treatment well with min complaints of pain during therex. Pt educated to perform HEP diligently to return to PLF.                        Plan                Next visit plan:       Progress to tolerance      Plan details:      Cont per POC      Subjective:   History of Present Illness            Subjective:     Pt reports 7/10, no medication. Non compliance with HEP due to pain since IE.  Pt reports feels better since tx session including MHP. Pt reports using heat at night, unable to tolerate cold/cryotherapy.       Pain  Current pain rating: 7  Location: R shoulder                  Objective:   Objective    Exercise or modality Sets x Reps Details Time (min) Charge   UBE  L1 Forw/back 10 Therapeutic exercise   Pulleys X 30 Flex; Scaption 5 Therapeutic exercise   Scap depression X 20 YTB 3 Therapeutic exercise   Scap retraction X 20 YTB 3 Therapeutic exercise   Ball on wall X 30 Orange ball 3 Therapeutic exercise   Shrugs X 20 Standing 2 Therapeutic exercise   UT stretch X 10 w/hold Seated 2 Therapeutic exercise   Brueggers X 20 YTB 3 Therapeutic exercise   Wand X 20 Flex/Scaption 4 Therapeutic exercise   Chin tucks  X 20 Supine 2 Therapeutic exercise   Shoulder Flexion X 20 Supine 3 Therapeutic exercise   PROM  RUE, all planes 8 Manual therapy   MHP  R shoulder 10 Modality   Education  HEP 5 Therapeutic exercise   IFC   --- E- stim unattended          TOTAL TREATMENT TIME   63 mins                  Therapeutic Interventions Charges  $$ Therapeutic Exercise [mins]: 45  $$ Manual Therapy [mins]: 8     Physical Agent Modalities Charges  $$ Hot or Cold Pack Application [mins]: 10           I attest that I have reviewed the above information.  Signed: Luana Shu, PTA  10/11/2021 12:14 PM

## 2021-10-11 NOTE — Unmapped (Signed)
I saw and evaluated the patient, participating in the key elements of the service.  I discussed the findings, assessment and plan with the resident and agree with resident’s findings and plan as documented in the resident's note.  I was immediately available for the entirety of the procedure(s) and present for the key and critical portions. Meriem Lemieux J Zenobia Kuennen, MD

## 2021-10-13 DIAGNOSIS — L732 Hidradenitis suppurativa: Principal | ICD-10-CM

## 2021-10-13 MED ORDER — HUMIRA PEN CITRATE FREE 40 MG/0.4 ML
SUBCUTANEOUS | 4 refills | 28.00000 days
Start: 2021-10-13 — End: ?

## 2021-10-13 NOTE — Unmapped (Signed)
[facility] Refill Coordination Note    Specialty Medication(s) to be Shipped:   Inflammatory Disorders: Humira and Rasuvo    Other medication(s) to be shipped: hydrOXYchloroQUINE 200 mg tablet (PLAQUENIL) - leuCOVorin 5 mg tablet (WELLCOVORIN)  -  promethazine 25 MG tablet (PHENERGAN) -      Kim Charles, DOB: 12-Jul-1981  Phone: [phone] (home)       All above HIPAA information was verified with patient.     Was a Nurse, learning disability used for this call? No    Completed refill call assessment today to schedule patient's medication shipment from the [facility] [phone]).  All relevant notes have been reviewed.     Specialty medication(s) and dose(s) confirmed: Regimen is correct and unchanged.   Changes to medications: Shayonna reports no changes at this time.  Changes to insurance: No  New side effects reported not previously addressed with a pharmacist or physician: None reported  Questions for the pharmacist: No    Confirmed patient received a Conservation officer, historic buildings and a Surveyor, mining with first shipment. The patient will receive a drug information handout for each medication shipped and additional FDA Medication Guides as required.       DISEASE/MEDICATION-SPECIFIC INFORMATION        For patients on injectable medications: Patient currently has 1 of each doses left.  Next injection is scheduled for 10/20/2021.    SPECIALTY MEDICATION ADHERENCE     Medication Adherence    Patient reported X missed doses in the last month: 0  Specialty Medication: Humira  Patient is on additional specialty medications: Yes  Additional Specialty Medications: Rasuvo  Patient Reported Additional Medication X Missed Doses in the Last Month: 0  Patient is on more than two specialty medications: No  Any gaps in refill history greater than 2 weeks in the last 3 months: no  Demonstrates understanding of importance of adherence: yes  Informant: patient  Reliability of informant: reliable  Confirmed plan for next specialty medication refill: delivery by pharmacy  Refills needed for supportive medications: not needed              Were doses missed due to medication being on hold? No    HUMIRA(CF) PEN 40 mg/0.4 mL: 7 days of medicine on hand   RASUVO (PF) 15 mg/0.3 mL: 7 days of medicine on hand       REFERRAL TO PHARMACIST     Referral to the pharmacist: Not needed      [facility] [place]     Shipping address confirmed in Epic.     Delivery Scheduled: Yes, Expected medication delivery date: 10/19/2021.  However, Rx request for refills was sent to the provider as there are none remaining.     Medication will be delivered via UPS to the prescription address in Epic WAM.    [name] D [name]   [facility] Specialty Technician

## 2021-10-16 MED ORDER — HUMIRA PEN CITRATE FREE 40 MG/0.4 ML
SUBCUTANEOUS | 4 refills | 28.00000 days | Status: CP
Start: 2021-10-16 — End: ?
  Filled 2021-10-18: qty 4, 28d supply, fill #0

## 2021-10-18 ENCOUNTER — Ambulatory Visit: Admit: 2021-10-18 | Discharge: 2021-10-19 | Payer: MEDICAID

## 2021-10-18 DIAGNOSIS — Z79631 Methotrexate, long term, current use: Principal | ICD-10-CM

## 2021-10-18 DIAGNOSIS — M359 Systemic involvement of connective tissue, unspecified: Principal | ICD-10-CM

## 2021-10-18 LAB — URINALYSIS WITH CULTURE REFLEX
BILIRUBIN UA: NEGATIVE
BLOOD UA: NEGATIVE
GLUCOSE UA: NEGATIVE
KETONES UA: NEGATIVE
LEUKOCYTE ESTERASE UA: NEGATIVE
NITRITE UA: NEGATIVE
PH UA: 6 (ref 5.0–9.0)
PROTEIN UA: NEGATIVE
RBC UA: <1 /HPF (ref 0–3)
SPECIFIC GRAVITY UA: 1.01 (ref 1.005–1.030)
SQUAMOUS EPITHELIAL: 3 /HPF (ref 0–5)
UROBILINOGEN UA: 0.2
WBC UA: 0 /HPF (ref 0–3)

## 2021-10-18 LAB — CBC W/ AUTO DIFF
BASOPHILS ABSOLUTE COUNT: 0.1 10*9/L (ref 0.0–0.1)
BASOPHILS RELATIVE PERCENT: 1 %
EOSINOPHILS ABSOLUTE COUNT: 0.2 10*9/L (ref 0.0–0.5)
EOSINOPHILS RELATIVE PERCENT: 1.7 %
HEMATOCRIT: 41 % (ref 34.0–44.0)
HEMOGLOBIN: 13.7 g/dL (ref 11.3–14.9)
LYMPHOCYTES ABSOLUTE COUNT: 4 10*9/L — ABNORMAL HIGH (ref 1.1–3.6)
LYMPHOCYTES RELATIVE PERCENT: 38.8 %
MEAN CORPUSCULAR HEMOGLOBIN CONC: 33.3 g/dL (ref 32.0–36.0)
MEAN CORPUSCULAR HEMOGLOBIN: 30.2 pg (ref 25.9–32.4)
MEAN CORPUSCULAR VOLUME: 90.8 fL (ref 77.6–95.7)
MEAN PLATELET VOLUME: 8.9 fL (ref 6.8–10.7)
MONOCYTES ABSOLUTE COUNT: 0.5 10*9/L (ref 0.3–0.8)
MONOCYTES RELATIVE PERCENT: 5.3 %
NEUTROPHILS ABSOLUTE COUNT: 5.4 10*9/L (ref 1.8–7.8)
NEUTROPHILS RELATIVE PERCENT: 53.2 %
PLATELET COUNT: 264 10*9/L (ref 150–450)
RED BLOOD CELL COUNT: 4.51 10*12/L (ref 3.95–5.13)
RED CELL DISTRIBUTION WIDTH: 13.8 % (ref 12.2–15.2)
WBC ADJUSTED: 10.3 10*9/L (ref 3.6–11.2)

## 2021-10-18 LAB — SEDIMENTATION RATE: ERYTHROCYTE SEDIMENTATION RATE: 4 mm/h (ref 0–20)

## 2021-10-18 LAB — C-REACTIVE PROTEIN: C-REACTIVE PROTEIN: <4 mg/L (ref <=10.0)

## 2021-10-18 LAB — ALT: ALT (SGPT): 9 U/L — ABNORMAL LOW (ref 10–49)

## 2021-10-18 LAB — CREATININE
CREATININE: 0.62 mg/dL
EGFR CKD-EPI (2021) FEMALE: >90 mL/min/{1.73_m2} (ref >=60)

## 2021-10-18 LAB — PROTEIN / CREATININE RATIO, URINE
CREATININE, URINE: 42.5 mg/dL
PROTEIN URINE: <6 mg/dL

## 2021-10-18 LAB — C3 COMPLEMENT: C3 COMPLEMENT: 75 mg/dL — ABNORMAL LOW (ref 90–170)

## 2021-10-18 LAB — C4 COMPLEMENT: C4 COMPLEMENT: 11.1 mg/dL — ABNORMAL LOW (ref 12.0–36.0)

## 2021-10-18 LAB — AST: AST (SGOT): 11 U/L (ref <=34)

## 2021-10-18 MED FILL — PROMETHAZINE 25 MG TABLET: ORAL | 8 days supply | Qty: 30 | Fill #1

## 2021-10-18 MED FILL — HYDROXYCHLOROQUINE 200 MG TABLET: ORAL | 30 days supply | Qty: 60 | Fill #5

## 2021-10-18 MED FILL — RASUVO (PF) 15 MG/0.3 ML SUBCUTANEOUS AUTO-INJECTOR: SUBCUTANEOUS | 28 days supply | Qty: 1.2 | Fill #6

## 2021-10-18 MED FILL — LEUCOVORIN CALCIUM 5 MG TABLET: 28 days supply | Qty: 12 | Fill #5
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
## 2021-10-18 NOTE — Unmapped (Addendum)
No change in medication.    Pain in right shoulder from bicipital tendonitis and fibromyalgia. Range of motion is full so no clear reason to get MRI    Seasonal flu and Covid 19 booster today. Skip your next dose of methotrexate following these vaccine shots. This will help your immune response.

## 2021-10-20 LAB — ANTI-DNA ANTIBODY, DOUBLE-STRANDED: DSDNA ANTIBODY: NEGATIVE

## 2021-10-25 DIAGNOSIS — M25511 Pain in right shoulder: Principal | ICD-10-CM

## 2021-10-25 NOTE — Unmapped (Signed)
Scapular Retraction    Wrap an elastic band around a door knob or banister. Grab the ends of the band with both hands with your arms extended. With good posture, pull the band backwards and squeeze your shoulder blades together for 3 seconds. Make sure your elbows stay close to your body. Repeat 10 Times   Complete 2 Sets   Perform 2 Times a Day          Lat pull down with band    Pull band straight down, bringing elbows to side. Can straighten elbow per therapist instructions. Repeat 10 Times   Complete 2 Sets   Perform 2 Times a Day

## 2021-10-25 NOTE — Unmapped (Signed)
[facility] PT  COMPLETE [place]  OUTPATIENT PHYSICAL THERAPY  10/25/2021  Note Type: Treatment Note       Patient Name: Kim Charles  Date of Birth:[birth year]  Diagnosis:   Encounter Diagnosis   Name Primary?   ??? Right shoulder pain, unspecified chronicity Yes     Referring MD:  Shara Blazing, PA     Date of Onset of Impairment-05/07/2018  Date PT Care Plan Established or Reviewed-10/04/2021  Date PT Treatment Started-10/04/2021   Plan of Care Effective Date: 10/04/2021 - 11/01/2021    PRECAUTIONS: Lupas, RA, cardiac,    Assessment/Plan:    Assessment details:       Pt was able to complete all exercises and tolerate treatment fairly well. Treatment was continued to focus on R shoulder ROM, strengthening, and stability, as well as scapular strengthening. Scapular punches were introduced this date with pt tolerating well, however TCs were required to ensure elbow extension throughout exercise. Cervical traction was included this date due to pt reports of numbness in B hands. Upon completion, pt reported a decrease in numbness. Empty end-feel noted during PROM with pt reporting pain at around 55-60% of each motion. Popping was noted during cervical traction, however pt denied pain and related it to her RA. IFC with MHP was applied to R shoulder for post-treatment soreness/pain-relief with pt reporting some improvement prior to departure. Pt was also given updated HEP (see wrap up) with pt verbalizing understanding on need for compliance.                       Plan                Next visit plan:       Progress to tolerance      Plan details:      Cont per POC      Subjective:   History of Present Illness            Subjective:     Pt arrived and reports 8/10 pain in R shoulder. She also reports pain in her R hip due to bursitis. She also reports she did not sleep well last night. Reports compliance with HEP.       Pain  Current pain rating: 8  Location: R shoulder                  Objective:   Objective    Exercise or modality Sets x Reps Details Time (min) Charge   UBE  L1 Forw/back 10 Therapeutic exercise   Pulleys X 30 Flex; Scaption 5 Therapeutic exercise   Scap depression X 30 YTB 3 Therapeutic exercise   Scap retraction X 30 YTB 3 Therapeutic exercise   Ball on wall X 30 Orange ball 3 Therapeutic exercise   Shrugs X 20 Standing, 2# B UE 2 Therapeutic exercise   UT stretch 3x30s (B) Seated 3 Therapeutic exercise   Brueggers X 20 YTB -- Therapeutic exercise   Wand X 20 Flex/Scaption -- Therapeutic exercise   Chin tucks  X 20 Seated 2 Therapeutic exercise   Shoulder Flexion X 20 Supine (painful) 3 Therapeutic exercise   PROM  Flex, abd, ER, IR with gentle oscillations  5 Manual Therapy   Cervical traction  Supine with gentle distractions 5 Manual Therapy    Education  Updated HEP 3 Therapeutic exercise   IFC w/ MHP  - seated, R shoulder 10 E- stim unattended  TOTAL TREATMENT TIME   57 mins 10 MT  10 E-stim  37 Te                                    I attest that I have reviewed the above information.  Signed: Norberta Keens, PTA  10/25/2021 10:38 AM

## 2021-11-01 ENCOUNTER — Encounter: Admit: 2021-11-01 | Discharge: 2021-11-01 | Payer: MEDICAID

## 2021-11-08 MED ORDER — EMPTY CONTAINER
0 refills | 0 days
Start: 2021-11-08 — End: ?

## 2021-11-08 NOTE — Unmapped (Signed)
[facility] Refill Coordination Note    Specialty Medication(s) to be Shipped:   Inflammatory Disorders: Humira and Rasuvo    Other medication(s) to be shipped: hydoxychloroquine, leucovorin, sharps container     Kim Charles, DOB: 02/03/1981  Phone: [phone] (home)       All above HIPAA information was verified with patient.     Was a Nurse, learning disability used for this call? No    Completed refill call assessment today to schedule patient's medication shipment from the [facility] [phone]).  All relevant notes have been reviewed.     Specialty medication(s) and dose(s) confirmed: Regimen is correct and unchanged.   Changes to medications: Kim Charles reports no changes at this time.  Changes to insurance: No  New side effects reported not previously addressed with a pharmacist or physician: None reported  Questions for the pharmacist: No    Confirmed patient received a Conservation officer, historic buildings and a Surveyor, mining with first shipment. The patient will receive a drug information handout for each medication shipped and additional FDA Medication Guides as required.       DISEASE/MEDICATION-SPECIFIC INFORMATION        For patients on injectable medications: Patient currently has 2 doses left.  Next injection is scheduled for 11/10/21.    SPECIALTY MEDICATION ADHERENCE     Medication Adherence    Patient reported X missed doses in the last month: 0  Specialty Medication: HUMIRA(CF)  Patient is on additional specialty medications: Yes  Additional Specialty Medications: RASUVO    Patient Reported Additional Medication X Missed Doses in the Last Month: 0  Patient is on more than two specialty medications: No              Were doses missed due to medication being on hold? No    Humira 40/0.4 mg/ml: 10 days of medicine on hand   rasuvo 15/0.3 mg/ml:10 days of medicine on hand         REFERRAL TO PHARMACIST     Referral to the pharmacist: Not needed      [facility]     Shipping address confirmed in Epic.     Delivery Scheduled: Yes, Expected medication delivery date: 11/17/21.     Medication will be delivered via UPS to the prescription address in Epic WAM.    [name]   [facility] Specialty Technician

## 2021-11-14 ENCOUNTER — Ambulatory Visit: Admit: 2021-11-14 | Payer: MEDICAID

## 2021-11-14 DIAGNOSIS — M25511 Pain in right shoulder: Principal | ICD-10-CM

## 2021-11-14 NOTE — Unmapped (Signed)
[facility] [place] [facility] [place]  OUTPATIENT PHYSICAL THERAPY  11/14/2021  Note Type: Re-evaluation/Progress Note  Note Date Range: 10/04/21-11/14/21    Patient Name: Kim Charles  Date of Birth:11-19-1981  Diagnosis:   Encounter Diagnosis   Name Primary?   ??? Right shoulder pain, unspecified chronicity Yes     Referring MD:  Shara Blazing, PA     Date of Onset of Impairment-05/07/2018  Date PT Care Plan Established or Reviewed-10/04/2021  Date PT Treatment Started-10/04/2021   Plan of Care Effective Date:     PRECAUTIONS: cardiac, hx of cancer, RA, Lupus    Assessment/Plan:    Assessment details:       Clinical presentation indicates signs/symptoms consistent with cervical strain and possible RTC impairment and/or bursitis. Limitations include: neck pain, shoulder pain, decreased active shoulder and cervical ROM and strength, edema, impaired scapulohumeral rhythm, decreased neuromuscular awareness, decreased cervical joint mobility, positive special tests, and altered functional status as noted on initial evaluation. Spasm and increased tone is present in the associated soft tissues. These impairments restrict the patient's functional ADLs, making skilled PT medically necessary to return patient to full function at an optimal state.    11/14/21: Patient has only completed 2 PT visits since IE with limitations d/t insurance as well as patient getting sick with the flu. She has been out of PT for 2.5 weeks due to being sick. She arrives today stating she has been performing her HEP occasionally with no change in shoulder. She only displayed mild progress in cervical ROM, however no change in shoulder ROM, strength, or pain level. She would continue to benefit from skilled PT to address her deficits and meet her goals. She was educated on limitations, HEP compliance, and POC. She verbalized understanding and agreed with continuing skilled PT.  Impairments: decreased mobility, impaired ADLs, pain, postural weakness, decreased range of motion, decreased strength, core weakness, decreased endurance, muscular restrictions, trigger points and impaired flexibility      Specific Comorbidities: see patient history    Body System: musculoskeletal and neuromuscular    Clinical Decision Making: moderate  Prognosis: good prognosis      Therapy Goals  Goals:      Treatment Goals: (to be achieved in 4 weeks)   1) Patient will be able to perform ADLS with max pain of 1-2/10 to facilitate return to PLOF (Pt reports 7/10 pain on average with ADLs)  2) improve affected shoulder AROM to WNLs to perform all adls without pain (no change)  3) improve strength of affected arm to 5/5  to perform housekeeping (no change)  4) improve cervical ROM to WNLs for looking up during overhead activities (mild progress)  5) independence in HEP to achieve the above given goals (ongoing)       Plan  Therapy options: will be seen for skilled physical therapy services  Planned therapy interventions: Ultrasound, Dry Needling, Education - Patient, Functional Mobility, Home Exercise Program, Manual Therapy, Neuromuscular Re-education, Postural Training, Therapeutic Activities, Therapeutic Exercises, Taping, E-Stim, Cryotherapy, Endurance Activites, Iontophoresis and Body Mechanics Training    Frequency: 2x week  Duration in weeks: 4 weeks  Education provided to: patient.  Education provided: HEP, Treatment options and plan and Symptom management  Education results: verbalized good understanding.  Communication/Consultation: Progress note sent to Referring Provider.    Total Session Time: 47  Treatment rendered today:       See grid.   Plan details:      Per poc  Subjective:   History of Present Condition      History of Present Condition/Chief Complaint:       Right shoulder pain  Subjective:     History of Injury: Kim Charles is a 40 y.o. female who presents with complaints of right shoulder pain since 05/07/2018 when she obtained an injury from a bulldog attacking her. Reports the dog bit her in 3 different areas on right UE with her shoulder being one. Denies previous surgeries to right shoulder and neck. Reports N/T down right UE into fingertips. Reports she also has Lupus and RA. Reports weakness in hand and dropping objects. Reports shoulder popping as well with specific shoulder movements - brushing her hair and bathing. States she had imaging done from doctor in [place] that showed OA and possible RTC issue.     11/14/21: Patient arrives reporting 5/10 right shoulder pain and partial compliance with HEP. States nothing has changed, however pt has not been to PT in 2.5 weeks. Relays she was sick with the flu and it takes her longer to recover from sickness d/t having Lupus.      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; pt on methotrexate for Lupus; RA)  Current functional status: limited lifting, limited household activities and disturbed sleep  Social Support  Hand dominance: right  Communication Preference: verbal, written and visual  Barriers to Learning: No Barriers      Treatments  Previous treatment: corticosteroid injection  Current treatment: physical therapy      Patient Goals  Patient goals for therapy: decrease assistance with ADLs, decreased pain, increased ROM, increased strength, independence with ADLs/IADLs, improve independence with dressing and improve overhead reaching        Objective:   Objective    Palpation:NA    Posture:FHP and rounded shoulders ; left shoulder superior to right; pt reports scoliosis in low back    Cervical Clearing Exam:repeated movements of cervical spine cause neck pain    Cervical ROM (% of movement)    Cervical ROM ROM       Flexion    WNL   Extension 350*   Retraction 50%   Protrusion 75%   Right side flexion WNL   Left side flexion 35*   Right rotation WNL   Left rotation WNL      ROM: (degrees)    RIGHT  LEFT    SHOULDER AROM PROM AROM PROM               Flexion 126 WNL WNL WNL        Abduction 130 WNL WNL WNL        Extension 51 WNL WNL WNL        IR T9 -- T9 --        ER T3 -- T4 --          ELBOW                   Flexion WNL WNL WNL WNL        Extension WNL WNL WNL WNL              Strength: pain with  all    RIGHT LEFT   SHOULDER          FLEXION  5-   5        ABDUCTION 5- 5        EXTENSION 5 5        IR 5- 5        ER 5- 5        ELBOW          FLEXION 5- 5        EXTENSION 5 5            Joint mobility:normal GH mobility in all directions      Special tests: RUE   Neers sign (SA impingement) - POS   Hawkins test (Supraspinatous impingement) - POS   Drop arm test (rotator cuff tear) - NEG   Speed's test (biceps tendon instability or tendonitis) - weakness, no pain   O'Brien test - POS   Jerk test - NEG   AC crossover test - NEG   Painful arc - NEG   Empty can - POS   Full can - NEG       Treatment:   Exercise or modality Sets x Reps Details Time (min) Charge   UBE 5'5' L2 Forw/back 10 Therapeutic exercise   Pulleys X 30 Flex; Scaption 5 Therapeutic exercise   Rows/PD X 30 RTB 3 Therapeutic exercise   Ball on wall X 30 Orange ball 3 Therapeutic exercise   Shrugs X 20 Standing, 2# B UE -- Therapeutic exercise   UT stretch 3x30s (B) Seated 3 Therapeutic exercise   Brueggers X 20 YTB -- Therapeutic exercise   Wand X 20 Flex/Scaption -- Therapeutic exercise   Chin tucks  X 20 Seated -- Therapeutic exercise   Shoulder Flexion X 20 Supine (painful) -- Therapeutic exercise   PROM ?? Flex, abd, ER, IR with gentle oscillations  -- Manual Therapy   Cervical traction ?? Supine with gentle distractions -- Manual Therapy    Education ?? HEP compliance, progress, limitations, POC 3 Therapeutic exercise   IFC w/ MHP ?? seated, R shoulder 10 E- stim unattended   ?? ?? ?? ?? ??   TOTAL TREATMENT TIME ?? ?? 47 mins  10 RE   27 TE   10 e-stim         PT Evaluation Charges  $$ PT Re-evaluation [mins]: 10     Therapeutic Interventions Charges  $$ Therapeutic Exercise [mins]: 27     Physical Agent Modalities Charges  $$ Electrical Stimulation- Unattended [mins]: 10           I attest that I have reviewed the above information.  SignedTana Coast, PT  11/14/2021 5:12 PM

## 2021-11-16 MED FILL — HYDROXYCHLOROQUINE 200 MG TABLET: ORAL | 30 days supply | Qty: 60 | Fill #6

## 2021-11-16 MED FILL — LEUCOVORIN CALCIUM 5 MG TABLET: 28 days supply | Qty: 12 | Fill #6

## 2021-11-16 MED FILL — RASUVO (PF) 15 MG/0.3 ML SUBCUTANEOUS AUTO-INJECTOR: SUBCUTANEOUS | 28 days supply | Qty: 1.2 | Fill #7

## 2021-11-16 MED FILL — HUMIRA PEN CITRATE FREE 40 MG/0.4 ML: SUBCUTANEOUS | 28 days supply | Qty: 4 | Fill #1

## 2021-11-16 MED FILL — EMPTY CONTAINER: 120 days supply | Qty: 1 | Fill #0

## 2021-12-04 DIAGNOSIS — M25511 Pain in right shoulder: Principal | ICD-10-CM

## 2021-12-13 NOTE — Unmapped (Signed)
[facility] Refill Coordination Note    Specialty Medication(s) to be Shipped:   Inflammatory Disorders: Humira and Rasuvo    Other medication(s) to be shipped: hydrOXYchloroQUINE 200 mg tablet (PLAQUENIL) - leuCOVorin 5 mg tablet (WELLCOVORIN)  -  promethazine 25 MG tablet (PHENERGAN) -      Kim Charles, DOB: 10-Mar-1981  Phone: [phone] (home)       All above HIPAA information was verified with patient.     Was a Nurse, learning disability used for this call? No    Completed refill call assessment today to schedule patient's medication shipment from the [facility] [phone]).  All relevant notes have been reviewed.     Specialty medication(s) and dose(s) confirmed: Regimen is correct and unchanged.   Changes to medications: Kim Charles reports no changes at this time.  Changes to insurance: No  New side effects reported not previously addressed with a pharmacist or physician: None reported  Questions for the pharmacist: No    Confirmed patient received a Conservation officer, historic buildings and a Surveyor, mining with first shipment. The patient will receive a drug information handout for each medication shipped and additional FDA Medication Guides as required.       DISEASE/MEDICATION-SPECIFIC INFORMATION        For patients on injectable medications: Patient currently has 1 of each doses left.  Next injection is scheduled for 12/15/2021.    SPECIALTY MEDICATION ADHERENCE     Medication Adherence    Patient reported X missed doses in the last month: 0  Specialty Medication: Humira CF 40 mg/0.4 ml  Patient is on additional specialty medications: Yes  Additional Specialty Medications: RASUVO (PF) 15 mg/0.3 mL  Patient Reported Additional Medication X Missed Doses in the Last Month: 0  Patient is on more than two specialty medications: No  Any gaps in refill history greater than 2 weeks in the last 3 months: no  Demonstrates understanding of importance of adherence: yes  Informant: patient  Reliability of informant: reliable  Confirmed plan for next specialty medication refill: delivery by pharmacy  Refills needed for supportive medications: not needed              Were doses missed due to medication being on hold? No    HUMIRA(CF) PEN 40 mg/0.4 mL: 7 days of medicine on hand   RASUVO (PF) 15 mg/0.3 mL: 7 days of medicine on hand       REFERRAL TO PHARMACIST     Referral to the pharmacist: Not needed      [facility]     Shipping address confirmed in Epic.     Delivery Scheduled: Yes, Expected medication delivery date: 12/19/2021.     Medication will be delivered via UPS to the prescription address in Epic WAM.    Sami Froh D Lonya Johannesen   [facility] Specialty Technician

## 2021-12-18 DIAGNOSIS — L732 Hidradenitis suppurativa: Principal | ICD-10-CM

## 2021-12-18 MED FILL — HYDROXYCHLOROQUINE 200 MG TABLET: ORAL | 30 days supply | Qty: 60 | Fill #7

## 2021-12-18 MED FILL — PROMETHAZINE 25 MG TABLET: ORAL | 8 days supply | Qty: 30 | Fill #2

## 2021-12-18 MED FILL — LEUCOVORIN CALCIUM 5 MG TABLET: 28 days supply | Qty: 12 | Fill #7

## 2021-12-18 MED FILL — RASUVO (PF) 15 MG/0.3 ML SUBCUTANEOUS AUTO-INJECTOR: SUBCUTANEOUS | 28 days supply | Qty: 1.2 | Fill #8

## 2021-12-20 NOTE — Unmapped (Signed)
Kim Charles 's Humira shipment will be delayed as a result of prior authorization being required by the patient's insurance.     I have reached out to the patient  at [phone] and communicated the delay. We will call the patient back to reschedule the delivery upon resolution. We have not confirmed the new delivery date.

## 2021-12-26 MED FILL — HUMIRA PEN CITRATE FREE 40 MG/0.4 ML: SUBCUTANEOUS | 28 days supply | Qty: 4 | Fill #2

## 2021-12-30 ENCOUNTER — Ambulatory Visit: Admit: 2021-12-30 | Discharge: 2021-12-31 | Discharge status: Against Medical Advice | Payer: PRIVATE HEALTH INSURANCE

## 2021-12-30 ENCOUNTER — Emergency Department: Admit: 2021-12-30 | Discharge: 2021-12-31 | Discharge status: Against Medical Advice | Payer: PRIVATE HEALTH INSURANCE

## 2021-12-30 DIAGNOSIS — Z5321 Procedure and treatment not carried out due to patient leaving prior to being seen by health care provider: Principal | ICD-10-CM

## 2021-12-30 NOTE — Unmapped (Cosign Needed)
Medical Screening Exam:  The patient complains of laceration of the small finger while using a knife earlier today.  Bleeding controlled.  Unknown tetanus.  VS - Reviewed and Stable   Vitals:    12/30/21 1400   BP: 155/121   Pulse: 94   Resp: 16   Temp: 37.1 ??C (98.7 ??F)   TempSrc: Oral   SpO2: 99%   Weight: 72.6 kg (160 lb)   Height: 154.9 cm (5' 1)     PE- GEN - A&0 x3, in NAD  Appropriate labs/tests/interventions ordered  Patient awaiting bed assignment

## 2022-01-09 NOTE — Unmapped (Signed)
Reason for call:     Pt is requesting referral for Pain Management in the [place] area. 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       Last ov: 10/18/2021  Next ov: 02/22/2022

## 2022-01-11 NOTE — Unmapped (Signed)
Reason for call:     Pt would like referral to go to:     [facility]  [address], [address]  [place], [place] [address]    Ph: [phone]  Fax: [phone]    I can fax w/ clinical note once entered.    Last ov: 10/18/2021  Next ov: 02/22/2022

## 2022-01-11 NOTE — Unmapped (Signed)
Referral order placed per pt request

## 2022-01-11 NOTE — Unmapped (Signed)
Addended by: Philippa Sicks on: 01/11/2022 03:55 PM     Modules accepted: Orders

## 2022-01-12 NOTE — Unmapped (Signed)
Reason for call:     Pts referral and clinical notes have been faxed to [facility].     Last ov: 10/18/2021  Next ov: 02/22/2022

## 2022-01-16 NOTE — Unmapped (Signed)
[facility] Refill Coordination Note    Specialty Medication(s) to be Shipped:   Inflammatory Disorders: Humira and Rasuvo    Other medication(s) to be shipped: hydrOXYchloroQUINE 200 mg tablet (PLAQUENIL) - leuCOVorin 5 mg tablet (WELLCOVORIN)  -  promethazine 25 MG tablet (PHENERGAN) -      Kim Charles, DOB: 05-10-1981  Phone: [phone] (home)       All above HIPAA information was verified with patient.     Was a Nurse, learning disability used for this call? No    Completed refill call assessment today to schedule patient's medication shipment from the [facility] [phone]).  All relevant notes have been reviewed.     Specialty medication(s) and dose(s) confirmed: Regimen is correct and unchanged.   Changes to medications: Harlyn reports no changes at this time.  Changes to insurance: No  New side effects reported not previously addressed with a pharmacist or physician: None reported  Questions for the pharmacist: No    Confirmed patient received a Conservation officer, historic buildings and a Surveyor, mining with first shipment. The patient will receive a drug information handout for each medication shipped and additional FDA Medication Guides as required.       DISEASE/MEDICATION-SPECIFIC INFORMATION        For patients on injectable medications: Patient currently has 0 doses left.  Next injection is scheduled for 01/19/2022.    SPECIALTY MEDICATION ADHERENCE     Medication Adherence    Patient reported X missed doses in the last month: 0  Specialty Medication: Humira CF 40 mg/0.4 ml  Patient is on additional specialty medications: Yes  Additional Specialty Medications: RASUVO (PF) 15 mg/0.3 mL   Patient Reported Additional Medication X Missed Doses in the Last Month: 0  Patient is on more than two specialty medications: No  Any gaps in refill history greater than 2 weeks in the last 3 months: no  Demonstrates understanding of importance of adherence: yes  Informant: patient  Reliability of informant: reliable  Confirmed plan for next specialty medication refill: delivery by pharmacy  Refills needed for supportive medications: not needed              Were doses missed due to medication being on hold? No    HUMIRA(CF) PEN 40 mg/0.4 mL: 0 days of medicine on hand   RASUVO (PF) 15 mg/0.3 mL: 0 days of medicine on hand       REFERRAL TO PHARMACIST     Referral to the pharmacist: Not needed      [facility]     Shipping address confirmed in Epic.     Delivery Scheduled: Yes, Expected medication delivery date: 01/18/2022.     Medication will be delivered via UPS to the prescription address in Epic WAM.    Kim Charles D Zaryan Yakubov   [facility] Specialty Technician

## 2022-01-17 MED FILL — PROMETHAZINE 25 MG TABLET: ORAL | 8 days supply | Qty: 30 | Fill #3

## 2022-01-17 MED FILL — LEUCOVORIN CALCIUM 5 MG TABLET: 28 days supply | Qty: 12 | Fill #8

## 2022-01-17 MED FILL — HYDROXYCHLOROQUINE 200 MG TABLET: ORAL | 30 days supply | Qty: 60 | Fill #8

## 2022-01-17 MED FILL — RASUVO (PF) 15 MG/0.3 ML SUBCUTANEOUS AUTO-INJECTOR: SUBCUTANEOUS | 28 days supply | Qty: 1.2 | Fill #9

## 2022-01-17 MED FILL — HUMIRA PEN CITRATE FREE 40 MG/0.4 ML: SUBCUTANEOUS | 28 days supply | Qty: 4 | Fill #3

## 2022-01-26 ENCOUNTER — Encounter: Admit: 2022-01-26 | Discharge: 2022-01-26 | Payer: PRIVATE HEALTH INSURANCE | Attending: Family | Primary: Family

## 2022-02-08 DIAGNOSIS — R110 Nausea: Principal | ICD-10-CM

## 2022-02-08 DIAGNOSIS — Z79631 Methotrexate, long term, current use: Principal | ICD-10-CM

## 2022-02-08 MED ORDER — PROMETHAZINE 25 MG TABLET
ORAL_TABLET | Freq: Four times a day (QID) | ORAL | 3 refills | 8 days | Status: CP | PRN
Start: 2022-02-08 — End: ?
  Filled 2022-02-20: qty 30, 8d supply, fill #0

## 2022-02-08 NOTE — Unmapped (Signed)
Phenergan Refill  Last ov: 10/18/2021  Next ov: 02/22/2022

## 2022-02-12 NOTE — Unmapped (Signed)
The [facility] has made a second and final attempt to reach this patient to refill the following medication:Humira and Rasuvo.      We have left voicemails on the following phone numbers: [phone].*Sent texts as well*    Dates contacted: 2/9 and 2/13  Last scheduled delivery: 1/19    The patient may be at risk of non-compliance with this medication. The patient should call the [facility] at [phone]  Option 4, then Option 2 (all other specialty patients) to refill medication.    Jim Lundin D Administrator [facility] Specialty Technician

## 2022-02-14 NOTE — Unmapped (Signed)
[facility] Refill Coordination Note    Specialty Medication(s) to be Shipped:   Inflammatory Disorders: Humira and Rasuvo    Other medication(s) to be shipped: hydrOXYchloroQUINE 200 mg, leuCOVorin 5 mg, promethazine 25 MG     Kim Charles, DOB: 1981-11-20  Phone: [phone] (home)       All above HIPAA information was verified with patient.     Was a Nurse, learning disability used for this call? No    Completed refill call assessment today to schedule patient's medication shipment from the [facility] [phone]).  All relevant notes have been reviewed.     Specialty medication(s) and dose(s) confirmed: Regimen is correct and unchanged.   Changes to medications: Kim Charles reports no changes at this time.  Changes to insurance: No  New side effects reported not previously addressed with a pharmacist or physician: None reported  Questions for the pharmacist: No    Confirmed patient received a Conservation officer, historic buildings and a Surveyor, mining with first shipment. The patient will receive a drug information handout for each medication shipped and additional FDA Medication Guides as required.       DISEASE/MEDICATION-SPECIFIC INFORMATION        For patients on injectable medications: Patient currently has 1 Humira & 1 Rasuvo doses left.  Next injection is scheduled for Humira 02/17/22 & Rasuvo 02/16/22.    SPECIALTY MEDICATION ADHERENCE     Medication Adherence    Specialty Medication: Humira  Patient is on additional specialty medications: Yes  Additional Specialty Medications: Rasuvo  Patient is on more than two specialty medications: No  Any gaps in refill history greater than 2 weeks in the last 3 months: no  Demonstrates understanding of importance of adherence: yes  Informant: patient  Reliability of informant: reliable  Confirmed plan for next specialty medication refill: delivery by pharmacy  Refills needed for supportive medications: not needed              Were doses missed due to medication being on hold? No    HUMIRA(CF) PEN 40 mg/0.4 mL : 7 days of medicine on hand   RASUVO (PF) 15 mg/0.3 mL : 7 days of medicine on hand       REFERRAL TO PHARMACIST     Referral to the pharmacist: Not needed      [facility]     Shipping address confirmed in Epic.     Delivery Scheduled: Yes, Expected medication delivery date: 02/21/22.     Medication will be delivered via UPS to the prescription address in Epic WAM.    [name]   [facility] Specialty Technician

## 2022-02-20 MED FILL — RASUVO (PF) 15 MG/0.3 ML SUBCUTANEOUS AUTO-INJECTOR: SUBCUTANEOUS | 28 days supply | Qty: 1.2 | Fill #10

## 2022-02-20 MED FILL — HUMIRA PEN CITRATE FREE 40 MG/0.4 ML: SUBCUTANEOUS | 28 days supply | Qty: 4 | Fill #4

## 2022-02-20 MED FILL — LEUCOVORIN CALCIUM 5 MG TABLET: 28 days supply | Qty: 12 | Fill #9

## 2022-02-20 MED FILL — HYDROXYCHLOROQUINE 200 MG TABLET: ORAL | 30 days supply | Qty: 60 | Fill #9

## 2022-03-09 ENCOUNTER — Ambulatory Visit: Admit: 2022-03-09 | Discharge: 2022-03-10 | Payer: PRIVATE HEALTH INSURANCE

## 2022-03-09 DIAGNOSIS — M359 Systemic involvement of connective tissue, unspecified: Principal | ICD-10-CM

## 2022-03-09 DIAGNOSIS — Z79631 Methotrexate, long term, current use: Principal | ICD-10-CM

## 2022-03-09 DIAGNOSIS — L732 Hidradenitis suppurativa: Principal | ICD-10-CM

## 2022-03-09 LAB — CBC W/ AUTO DIFF
BASOPHILS ABSOLUTE COUNT: 0.2 10*9/L — ABNORMAL HIGH (ref 0.0–0.1)
BASOPHILS RELATIVE PERCENT: 1 %
EOSINOPHILS ABSOLUTE COUNT: 0.1 10*9/L (ref 0.0–0.5)
EOSINOPHILS RELATIVE PERCENT: 0.9 %
HEMATOCRIT: 47.2 % — ABNORMAL HIGH (ref 34.0–44.0)
HEMOGLOBIN: 15.4 g/dL — ABNORMAL HIGH (ref 11.3–14.9)
LYMPHOCYTES ABSOLUTE COUNT: 4.9 10*9/L — ABNORMAL HIGH (ref 1.1–3.6)
LYMPHOCYTES RELATIVE PERCENT: 31.1 %
MEAN CORPUSCULAR HEMOGLOBIN CONC: 32.6 g/dL (ref 32.0–36.0)
MEAN CORPUSCULAR HEMOGLOBIN: 29 pg (ref 25.9–32.4)
MEAN CORPUSCULAR VOLUME: 89 fL (ref 77.6–95.7)
MEAN PLATELET VOLUME: 8.5 fL (ref 6.8–10.7)
MONOCYTES ABSOLUTE COUNT: 0.9 10*9/L — ABNORMAL HIGH (ref 0.3–0.8)
MONOCYTES RELATIVE PERCENT: 5.5 %
NEUTROPHILS ABSOLUTE COUNT: 9.6 10*9/L — ABNORMAL HIGH (ref 1.8–7.8)
NEUTROPHILS RELATIVE PERCENT: 61.5 %
NUCLEATED RED BLOOD CELLS: 0 /100{WBCs} (ref <=4)
PLATELET COUNT: 355 10*9/L (ref 150–450)
RED BLOOD CELL COUNT: 5.3 10*12/L — ABNORMAL HIGH (ref 3.95–5.13)
RED CELL DISTRIBUTION WIDTH: 14 % (ref 12.2–15.2)
WBC ADJUSTED: 15.6 10*9/L — ABNORMAL HIGH (ref 3.6–11.2)

## 2022-03-09 LAB — C3 COMPLEMENT: C3 COMPLEMENT: 87 mg/dL — ABNORMAL LOW (ref 90–170)

## 2022-03-09 LAB — URINALYSIS WITH MICROSCOPY WITH CULTURE REFLEX
BILIRUBIN UA: NEGATIVE
BLOOD UA: NEGATIVE
GLUCOSE UA: NEGATIVE
KETONES UA: NEGATIVE
LEUKOCYTE ESTERASE UA: NEGATIVE
NITRITE UA: NEGATIVE
PH UA: 5.5 (ref 5.0–9.0)
PROTEIN UA: NEGATIVE
RBC UA: 1 /HPF (ref 0–3)
SPECIFIC GRAVITY UA: 1.015 (ref 1.005–1.030)
SQUAMOUS EPITHELIAL: 40 /HPF — ABNORMAL HIGH (ref 0–5)
UROBILINOGEN UA: 0.2
WBC UA: 3 /HPF (ref 0–3)

## 2022-03-09 LAB — PROTEIN / CREATININE RATIO, URINE
CREATININE, URINE: 110.6 mg/dL
PROTEIN URINE: 19.4 mg/dL
PROTEIN/CREAT RATIO, URINE: 0.175

## 2022-03-09 LAB — ALT: ALT (SGPT): 21 U/L (ref 10–49)

## 2022-03-09 LAB — C4 COMPLEMENT: C4 COMPLEMENT: 9.6 mg/dL — ABNORMAL LOW (ref 12.0–36.0)

## 2022-03-09 LAB — BUN: BLOOD UREA NITROGEN: 9 mg/dL (ref 9–23)

## 2022-03-09 LAB — ALBUMIN: ALBUMIN: 4 g/dL (ref 3.4–5.0)

## 2022-03-09 LAB — CREATININE
CREATININE: 0.72 mg/dL
EGFR CKD-EPI (2021) FEMALE: >90 mL/min/{1.73_m2} (ref >=60)

## 2022-03-09 LAB — AST: AST (SGOT): 15 U/L (ref <=34)

## 2022-03-09 MED ORDER — HUMIRA PEN CITRATE FREE 40 MG/0.4 ML
SUBCUTANEOUS | 4 refills | 28 days
Start: 2022-03-09 — End: ?

## 2022-03-09 NOTE — Unmapped (Signed)
Rheumatology return visit    REASON FOR VISIT: F/u     HISTORY: [name] is a 41 y.o. female with hx of undifferentiated connective tissue disease characterized by +ANA, +SSA, photosensitivity, raynaud's, fatigue, weight loss, episodic generalized edema and polyarthralgia with swelling of both large and small joints, recurrent pneumonitis/pleuritis, malar rash, generalized weakness that is worse proximally, nausea/vomiting/abdominal bloating, mild sicca symptoms, oral ulcerations, history of recurrent miscarriages (no VTE, minimally elevated aCL IgG/IgM, neg LAC and neg B2GP1 Ab) and migraines.   She is being treated with HCQ.  MTX added in 12/2018 due to concerns for inflammatory arthritis. Changed to SQ mtx in 07/2019.   Additional PMH of childhood leukemia, hidradenitis suppurativa, asthma, OSA, fibromyalgia.     For her hidradenitis, humira was started in 04/2019. She has developed a +dsDNA since starting humira, but no clinical features of lupus. Continuing to monitor.     Interim history:   Pt presents for f/u. She is accompanied by her mother today.     She states that she is tired of hiding how poorly she feels. She usually lies at her appointments and says she is feeling ok, but she is ready to tell the truth now because she feels so lousy. She has felt worse in the past week, which she attributes to a lot of stress with family. Has stress with her children and also with recent deaths in the family. She has had difficulty sleeping. Feels she is in a flare currently. Lots of fatigue. More joint pain, particularly of knees and elbows. She has fallen twice in the interim due to feeling her R leg give way beneath her.   Saw her PCP 2 days ago and given a steroid shot for pain and inflammation.   Chronic pain in the R shoulder and cannot lay on her R side. She has an MRI that has been ordered by her local pain clinic of the R shoulder.     She complains of peeing a lot at night. Will get up multiple times at night to use the bathroom.  She has also had 3 UTIs this year so far, treated by PCP. She is also followed by local urologist due to having a cyst on her kidney that is being monitored. She has an upcoming appt with urology, and plans to discuss her frequent UTIs with them. PCP also wanted her to discuss the night-time urination with urology.     Had a low grade fever that her PCP atrributed to MRSA in ears and nose. Has not been able to fill the cream prescribed to treat this. Temp 99.0.   Has some MRSA sores on the skin.     One night had SOB, but none since then. No CP.     Worsening dry eyes, and they often feel gritty.     She is having trouble tolerating the mtx. Has severe nausea and often vomiting after her dose weekly. Also severe fatigue for 3 days after dose. She also feels that it is causing cramping in the calves.     No interim hospitalization.     CURRENT MEDICATIONS:  Current Outpatient Medications   Medication Sig Dispense Refill   ??? albuterol (PROVENTIL HFA;VENTOLIN HFA) 90 mcg/actuation inhaler Inhale 2 puffs daily as needed.     ??? amLODIPine (NORVASC) 10 MG tablet Take 1 tablet (10 mg total) by mouth daily. 90 tablet 0   ??? aspirin-acetaminophen-caffeine (EXCEDRIN MIGRAINE) 250-250-65 mg per tablet Take 2 tablets by mouth.     ???  beclomethasone (QVAR) 40 mcg/actuation inhaler Inhale 2 puffs daily as needed.     ??? benzonatate (TESSALON) 200 MG capsule Take 1 capsule (200 mg total) by mouth Three (3) times a day as needed for cough. 20 capsule 0   ??? clindamycin (CLEOCIN) 300 MG capsule 300mg  twice daily 63 capsule 2   ??? cloNIDine HCL (CATAPRES) 0.1 MG tablet Take 1 tablet (0.1 mg total) by mouth Three (3) times a day. 90 tablet 1   ??? dexlansoprazole (DEXILANT) 60 mg capsule Take 60 mg by mouth.     ??? EPINEPHrine (EPIPEN) 0.3 mg/0.3 mL injection Inject 0.3 mL (0.3 mg total) into the muscle.     ??? escitalopram oxalate (LEXAPRO) 10 MG tablet Take 1 tablet (10 mg total) by mouth daily.  3   ??? fluticasone propionate (FLOVENT HFA) 220 mcg/actuation inhaler 2 puffs.     ??? gabapentin (NEURONTIN) 300 MG capsule Take 1 capsule (300 mg total) by mouth Three (3) times a day.     ??? HUMIRA PEN CITRATE FREE 40 MG/0.4 ML Inject the contents of 1 pen (40 mg total) under the skin every seven (7) days. 4 each 4   ??? hydroCHLOROthiazide (HYDRODIURIL) 50 MG tablet Take 1 tablet (50 mg total) by mouth daily. 90 tablet 0   ??? HYDROcodone-acetaminophen (NORCO 10-325) 10-325 mg per tablet Take 1 tablet by mouth 5 times daily as needed for pain     ??? hydrOXYchloroQUINE (PLAQUENIL) 200 mg tablet Take 2 tablets (400 mg total) by mouth in the morning. 180 tablet 3   ??? HYSINGLA ER 20 mg TP24 Take 1 tablet by mouth every 24 hours     ??? ipratropium (ATROVENT) 0.03 % nasal spray 2 sprays into each nostril.     ??? leuCOVorin (WELLCOVORIN) 5 mg tablet Take 3 tablets (15 mg) by mouth once a week 18-24 hours after the methotrexate 90 tablet 3   ??? lisinopriL (PRINIVIL,ZESTRIL) 20 MG tablet Take 1 tablet (20 mg total) by mouth daily. 30 tablet 6   ??? metFORMIN (GLUCOPHAGE) 500 MG tablet TAKE 1 TABLET BY MOUTH 2 TIMES DAILY WITH MEALS 180 tablet 3   ??? methotrexate, PF, 15 mg/0.3 mL AtIn Inject the contents of 1 pen (15 mg) under the skin every seven (7) days. 1.2 mL 11   ??? MITIGARE 0.6 mg cap capsule Take 1 capsule (0.6 mg total) by mouth daily.     ??? montelukast (SINGULAIR) 10 mg tablet Take 1 tablet (10 mg total) by mouth nightly. 90 tablet 0   ??? NARCAN 4 mg/actuation nasal spray spray (4 mg) in 1 nostril may repeat dose every 2-3 minutes as needed alternating nostrils with each dose     ??? promethazine (PHENERGAN) 25 MG tablet Take 1 tablet (25 mg total) by mouth every six (6) hours as needed for nausea. 30 tablet 3   ??? propranoloL (INDERAL) 40 MG tablet Take 1 tablet (40 mg total) by mouth Two (2) times a day. 180 tablet 0   ??? rifAMPin (RIFADIN) 300 MG capsule Take 1 capsule (300 mg total) by mouth two (2) times a day. 60 capsule 2   ??? spironolactone (ALDACTONE) 25 MG tablet Take 1 tablet (25 mg total) by mouth daily. 90 tablet 0   ??? syringe with needle (BD TUBERCULIN SYRINGE) 1 mL 25 gauge x 5/8 Syrg Use 1 syringe once a week for methotrexate injections. 50 each 7   ??? topiramate (TOPAMAX) 50 MG tablet TAKE 1 TABLET BY MOUTH 2  TIMES DAILY  5   ??? traZODone (DESYREL) 50 MG tablet Take 1 tablet (50 mg total) by mouth.     ??? VYVANSE 30 mg capsule Take 1 capsule (30 mg total) by mouth two (2) times a day.  0   ??? empty container (SHARPS CONTAINER) Misc Use as directed 1 each 2   ??? empty container Misc Use as directed 1 each 0     No current facility-administered medications for this visit.       Past Medical History:   Diagnosis Date   ??? ADHD (attention deficit hyperactivity disorder)    ??? Allergic 2007    Allergic to shrimp   ??? Anemia    ??? Asthma    ??? Chronic pain    ??? Disorder of skin or subcutaneous tissue 2001    Hidradenitis suprative   ??? Fibromyalgia    ??? Hidradenitis suppurativa    ??? Hypertension    ??? Leukemia (CMS-HCC)    ??? Lung disease 2017    COPD   ??? Lupus (systemic lupus erythematosus) (CMS-HCC)    ??? Migraines    ??? Osteoarthritis    ??? Scoliosis         Record Review: Available records were reviewed, including pertinent office visits, labs, and imaging.      REVIEW OF SYSTEMS: Ten system were reviewed and negative except as noted above.    PHYSICAL EXAM:  Vitals:    03/09/22 1041   BP: 138/90   BP Site: L Arm   BP Position: Sitting   BP Cuff Size: Medium   Pulse: 90   Temp: 36.2 ??C (97.2 ??F)   TempSrc: Temporal   Weight: 76.2 kg (168 lb)   Height: 154.9 cm (5' 1)      General:   Pleasant 41 y.o.female in no acute distress, WDWN   Cardiovascular:  Regular rate and rhythm. No murmur, rub, or gallop. No lower extremity edema.    Lungs:  Clear to auscultation.Normal respiratory effort.    Musculoskeletal:   General: Ambulates w/o assistance   Hands: No swelling bilaterally.  Tenderness of all MCPs on R.   Wrists:FROM w/o swelling or tenderness   Elbows: FROM w/o swelling or tenderness   Shoulders: FROM bilaterally, painful range of motion b/l   Knees: FROM w/o effusions. Painful ROM b/l   Ankles: Cool effusions bilaterally with tenderness.   Feet: No pain with MTP squeeze    Psych:  Appropriate affect and mood   Skin:  No rashes.        ASSESSMENT/PLAN:  1. Sjogren's syndrome vs UCTD   No synovitis today. Do not recommend escalation of immunosuppression. She is having trouble tolerating mtx, and unclear that she still needs this. She is hoping to stop mtx entirely, so we will d/c mtx and leucovorin today. She may contact us if she feels worse off this. Continue HCQ 400 mg qd.   Checking labs below.   - Anti-DNA antibody, double-stranded  - BUN  - C3 complement  - C4 complement  - Protein/Creatinine Ratio, Urine  - Urinalysis with Microscopy with Culture Reflex    2. Hidradenitis suppurativa  Continue follow-up with dermatology, continue Humira per their recommendations.    3. Fibromyalgia  Symptomatic and I think this is the main driver of her pain. Exacerbated by stress. She will continue f/u with therapist. Discussed importance of low impact exercise. Discussed that I think her pain is exacerbated by her stressors, and recommend working with therapist  to develop coping strategies for the stress.     4. Methotrexate, long term, current use  Checking labs below to evaluate for medication toxicity.    - Albumin  - ALT  - AST  - CBC w/ Differential  - Creatinine        HCM:   - PCV20: 03/09/22  - PPSV 23 Status:01/19/15  - Annual Influenza vaccine. Status:  10/18/21  -COVID-19 vaccine: Pfizer 02/20/2020, 03/31/2020, 02/09/2021.  Moderna 06/08/2021.  Pfizer bivalent 10/18/21  - Bone health: not on prednisone   - Plaquenil eye exam:  08/04/21  - Contraception: s/p hysterectomy         Return to clinic as scheduled in 4 months with Dr Scarlette Calico   Greater than 40 minutes spent in visit with patient, including pre and postvisit activities.

## 2022-03-09 NOTE — Unmapped (Addendum)
Can call the [facility] ophthalmology clinic to discuss treatment of dry eyes [phone]    Try stopping methotrexate. Do not need to take leucovorin since you are not taking methotrexate. Let me know if you feel worse off this and we can resume it.

## 2022-03-12 MED ORDER — EMPTY CONTAINER
4 refills | 0 days
Start: 2022-03-12 — End: ?

## 2022-03-12 MED ORDER — HUMIRA PEN CITRATE FREE 40 MG/0.4 ML
SUBCUTANEOUS | 4 refills | 28.00000 days | Status: CP
Start: 2022-03-12 — End: ?
  Filled 2022-03-28: qty 4, 28d supply, fill #0

## 2022-03-13 LAB — ANTI-DNA ANTIBODY, DOUBLE-STRANDED: DSDNA ANTIBODY: NEGATIVE

## 2022-03-20 NOTE — Unmapped (Signed)
The [facility] has made a second and final attempt to reach this patient to refill the following medication:Humira.      We have left voicemails on the following phone numbers: [phone] (sent texts as well).    Dates contacted: 3/13 3/21  Last scheduled delivery: 2/22    The patient may be at risk of non-compliance with this medication. The patient should call the [facility] at [phone]  Option 4, then Option 2 (all other specialty patients) to refill medication.    Nettie Cromwell D Administrator [facility] Specialty Technician

## 2022-03-26 NOTE — Unmapped (Signed)
[facility] Refill Coordination Note    Specialty Medication(s) to be Shipped:   Inflammatory Disorders: Humira    Other medication(s) to be shipped: Empty Red Container     Kim Charles, DOB: 1981/12/18  Phone: [phone] (home)       All above HIPAA information was verified with patient.     Was a Nurse, learning disability used for this call? No    Completed refill call assessment today to schedule patient's medication shipment from the [facility] [phone]).  All relevant notes have been reviewed.     Specialty medication(s) and dose(s) confirmed: Regimen is correct and unchanged.   Changes to medications: Kim Charles reports no changes at this time.  Changes to insurance: No  New side effects reported not previously addressed with a pharmacist or physician: None reported  Questions for the pharmacist: No    Confirmed patient received a Conservation officer, historic buildings and a Surveyor, mining with first shipment. The patient will receive a drug information handout for each medication shipped and additional FDA Medication Guides as required.       DISEASE/MEDICATION-SPECIFIC INFORMATION        For patients on injectable medications: Patient currently has 0 doses left.  Next injection is scheduled for 04/01/22.    SPECIALTY MEDICATION ADHERENCE     Medication Adherence    Patient reported X missed doses in the last month: 0  Specialty Medication: Humira 40mg /0.31ml  Patient is on additional specialty medications: No              Were doses missed due to medication being on hold? No    Humira (CF) 40mg  /0.4,l: 0 days of medicine on hand       REFERRAL TO PHARMACIST     Referral to the pharmacist: Not needed      [facility]     Shipping address confirmed in Epic.     Delivery Scheduled: Yes, Expected medication delivery date: 03/29/22.     Medication will be delivered via UPS to the prescription address in Epic WAM.    Kim Charles [facility] Specialty Technician

## 2022-03-28 MED FILL — EMPTY CONTAINER: 120 days supply | Qty: 1 | Fill #0

## 2022-04-22 ENCOUNTER — Emergency Department
Admit: 2022-04-22 | Discharge: 2022-04-23 | Disposition: A | Discharge status: Home / Self Care | Payer: PRIVATE HEALTH INSURANCE | Attending: Student in an Organized Health Care Education/Training Program

## 2022-04-22 ENCOUNTER — Ambulatory Visit
Admit: 2022-04-22 | Discharge: 2022-04-23 | Disposition: A | Discharge status: Home / Self Care | Payer: PRIVATE HEALTH INSURANCE | Attending: Student in an Organized Health Care Education/Training Program

## 2022-04-23 MED ADMIN — cyclobenzaprine (FLEXERIL) tablet 10 mg: 10 mg | ORAL | @ 02:00:00 | Stop: 2022-04-22

## 2022-04-23 MED ADMIN — dexAMETHasone sodium phos (PF) injection 10 mg: 10 mg | INTRAMUSCULAR | @ 02:00:00 | Stop: 2022-04-22

## 2022-04-23 MED ADMIN — acetaminophen (TYLENOL) tablet 975 mg: 1000 mg | ORAL | @ 01:00:00 | Stop: 2022-04-22
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
## 2022-04-23 NOTE — Unmapped (Signed)
PT REPORTS NECK PAIN X 3 WEEKS, NO INJURY REPORTED , HX OF LUPUS

## 2022-04-23 NOTE — Unmapped (Signed)
Patient resting peacefully in bed at this time. Rates pain 3/10 without movement, 5/10 with movement

## 2022-04-25 NOTE — Unmapped (Signed)
[facility] Refill Coordination Note    Specialty Medication(s) to be Shipped:   Inflammatory Disorders: Humira    Other medication(s) to be shipped: hydrOXYchloroQUINE 200 mg tablet, promethazine 25 MG tablet     Kim Charles, DOB: Oct 30, 1981  Phone: [phone] (home)       All above HIPAA information was verified with patient.     Was a Nurse, learning disability used for this call? No    Completed refill call assessment today to schedule patient's medication shipment from the [facility] [phone]).  All relevant notes have been reviewed.     Specialty medication(s) and dose(s) confirmed: Regimen is correct and unchanged.   Changes to medications: Kim Charles reports starting the following medications: Cyclobenzaprine & antibiotic  Changes to insurance: No  New side effects reported not previously addressed with a pharmacist or physician: None reported  Questions for the pharmacist: No    Confirmed patient received a Conservation officer, historic buildings and a Surveyor, mining with first shipment. The patient will receive a drug information handout for each medication shipped and additional FDA Medication Guides as required.       DISEASE/MEDICATION-SPECIFIC INFORMATION        For patients on injectable medications: Patient currently has 2 doses left.  Next injection is scheduled for 04/29/22.    SPECIALTY MEDICATION ADHERENCE     Medication Adherence    Patient reported X missed doses in the last month: 1  Specialty Medication: Humira  Patient is on additional specialty medications: No  Informant: patient  Reliability of informant: reliable  Confirmed plan for next specialty medication refill: delivery by pharmacy  Refills needed for supportive medications: not needed              Were doses missed due to medication being on hold? No    Humira 40/0.4 mg/ml: 7 days of medicine on hand         REFERRAL TO PHARMACIST     Referral to the pharmacist: Not needed      [facility]     Shipping address confirmed in Epic.     Delivery Scheduled: Yes, Expected medication delivery date: 05/01/22.     Medication will be delivered via UPS to the prescription address in Epic WAM.    Willette Pa   [facility] Specialty Technician

## 2022-04-25 NOTE — Unmapped (Signed)
The [facility] has made a second and final attempt to reach this patient to refill the following medication:Humira.      We have left voicemails on the following phone numbers: [phone] and have sent a text message to the following phone numbers: [phone].    Dates contacted: 4/21 4/26  Last scheduled delivery: 3/30    The patient may be at risk of non-compliance with this medication. The patient should call the [facility] at [phone]  Option 4, then Option 2 (all other specialty patients) to refill medication.    Deborah Lazcano D Administrator [facility] Specialty Technician

## 2022-04-30 MED FILL — PROMETHAZINE 25 MG TABLET: ORAL | 8 days supply | Qty: 30 | Fill #1

## 2022-04-30 MED FILL — HUMIRA PEN CITRATE FREE 40 MG/0.4 ML: SUBCUTANEOUS | 28 days supply | Qty: 4 | Fill #1

## 2022-04-30 MED FILL — HYDROXYCHLOROQUINE 200 MG TABLET: ORAL | 30 days supply | Qty: 60 | Fill #10

## 2022-05-17 ENCOUNTER — Ambulatory Visit: Admit: 2022-05-17 | Discharge: 2022-05-18 | Payer: PRIVATE HEALTH INSURANCE

## 2022-05-17 DIAGNOSIS — J069 Acute upper respiratory infection, unspecified: Principal | ICD-10-CM

## 2022-05-17 DIAGNOSIS — R062 Wheezing: Principal | ICD-10-CM

## 2022-05-17 MED ORDER — AZITHROMYCIN 250 MG TABLET
ORAL_TABLET | 0 refills | 5 days | Status: CP
Start: 2022-05-17 — End: 2022-05-22

## 2022-05-17 MED ORDER — METHYLPREDNISOLONE 4 MG TABLETS IN A DOSE PACK
0 refills | 0 days | Status: CP
Start: 2022-05-17 — End: ?

## 2022-05-17 MED ORDER — ALBUTEROL SULFATE HFA 90 MCG/ACTUATION INHALER T-HOME
Freq: Four times a day (QID) | RESPIRATORY_TRACT | 0 refills | 0 days | Status: CP | PRN
Start: 2022-05-17 — End: ?

## 2022-05-17 MED ORDER — CETIRIZINE 10 MG TABLET
ORAL_TABLET | Freq: Every day | ORAL | 0 refills | 30 days | Status: CP
Start: 2022-05-17 — End: 2022-06-16

## 2022-05-17 NOTE — Unmapped (Signed)
Patient ID: Kim Charles is a 41 y.o. female.    The following information was reviewed by members of the visit team:   Allergies - Medications - Medical History - Surgical History - Family   History - Substance & Sexual Activity History - Socioeconomic History -   Social Documentation History -        PHQ-2 Score: 00      Screening complete, no depression identified / no further action needed today      Subjective:  Started on Tuesday with cough, runny nose, pnd, fever hoarse      Review of systems:   Review of systems negative unless otherwise stated in HPI       Physical Exam  HENT:      Head: Normocephalic.      Right Ear: Tympanic membrane, ear canal and external ear normal.      Left Ear: Tympanic membrane, ear canal and external ear normal.      Nose: Congestion present.      Mouth/Throat:      Mouth: Mucous membranes are moist.      Comments: pnd  Cardiovascular:      Rate and Rhythm: Normal rate and regular rhythm.      Heart sounds: Normal heart sounds.   Pulmonary:      Effort: Pulmonary effort is normal.      Comments: Mild end exp wheeze  Lymphadenopathy:      Cervical: No cervical adenopathy.        Vitals:    05/17/22 1354   BP: 148/97   BP Site: L Arm   BP Position: Sitting   Pulse: 103   Resp: 16   Temp: 37.1 ??C (98.7 ??F)   SpO2: 97%   Weight: 78 kg (172 lb)         Assessment/Plan:     Diagnoses and all orders for this visit:    URI, acute  -     azithromycin (ZITHROMAX Z-PAK) 250 MG tablet; Take 2 tablets (500 mg) on  Day 1,  followed by 1 tablet (250 mg) once daily on Days 2 through 5.  -     methylPREDNISolone (MEDROL DOSEPACK) 4 mg tablet; follow package directions  -     cetirizine (ZYRTEC) 10 MG tablet; Take 1 tablet (10 mg total) by mouth daily.    Wheeze  -     methylPREDNISolone (MEDROL DOSEPACK) 4 mg tablet; follow package directions  -     albuterol (PROVENTIL HFA;VENTOLIN HFA) 90 mcg/actuation inhaler; Inhale 2 puffs every six (6) hours as needed.             Tish Frederickson

## 2022-06-05 NOTE — Unmapped (Signed)
[facility] Clinical Assessment & Refill Coordination Note    Kim Charles, DOB: 08/06/1981  Phone: [phone] (home)     All above HIPAA information was verified with patient.     Was a Nurse, learning disability used for this call? No    Specialty Medication(s):   Inflammatory Disorders: Humira     Current Outpatient Medications   Medication Sig Dispense Refill    albuterol (PROVENTIL HFA;VENTOLIN HFA) 90 mcg/actuation inhaler Inhale 2 puffs every six (6) hours as needed. 18 g 0    amLODIPine (NORVASC) 10 MG tablet Take 1 tablet (10 mg total) by mouth daily. 90 tablet 0    aspirin-acetaminophen-caffeine (EXCEDRIN MIGRAINE) 250-250-65 mg per tablet Take 2 tablets by mouth.      beclomethasone (QVAR) 40 mcg/actuation inhaler Inhale 2 puffs daily as needed.      benzonatate (TESSALON) 200 MG capsule Take 1 capsule (200 mg total) by mouth Three (3) times a day as needed for cough. 20 capsule 0    cetirizine (ZYRTEC) 10 MG tablet Take 1 tablet (10 mg total) by mouth daily. 30 tablet 0    clindamycin (CLEOCIN) 300 MG capsule 300mg  twice daily (Patient not taking: Reported on 05/17/2022) 63 capsule 2    cloNIDine HCL (CATAPRES) 0.1 MG tablet Take 1 tablet (0.1 mg total) by mouth Three (3) times a day. 90 tablet 1    cyclobenzaprine (FLEXERIL) 10 MG tablet Take 1 tablet (10 mg total) by mouth two (2) times a day as needed for muscle spasms. 20 tablet 0    dexlansoprazole (DEXILANT) 60 mg capsule Take 1 capsule (60 mg total) by mouth.      empty container (SHARPS CONTAINER) Misc Use as directed (Patient not taking: Reported on 05/17/2022) 1 each 2    empty container Misc Use as directed (Patient not taking: Reported on 05/17/2022) 1 each 0    empty container Misc Use as directed (Patient not taking: Reported on 05/17/2022) 1 each 4    EPINEPHrine (EPIPEN) 0.3 mg/0.3 mL injection Inject 0.3 mL (0.3 mg total) into the muscle.      escitalopram oxalate (LEXAPRO) 10 MG tablet Take 1 tablet (10 mg total) by mouth daily.  3    fluticasone propionate (FLOVENT HFA) 220 mcg/actuation inhaler 2 puffs.      gabapentin (NEURONTIN) 300 MG capsule Take 1 capsule (300 mg total) by mouth Three (3) times a day.      HUMIRA PEN CITRATE FREE 40 MG/0.4 ML Inject the contents of 1 pen (40 mg total) under the skin every seven (7) days. 4 each 4    hydroCHLOROthiazide (HYDRODIURIL) 50 MG tablet Take 1 tablet (50 mg total) by mouth daily. 90 tablet 0    HYDROcodone-acetaminophen (NORCO 10-325) 10-325 mg per tablet Take 1 tablet by mouth 5 times daily as needed for pain      hydrOXYchloroQUINE (PLAQUENIL) 200 mg tablet Take 2 tablets (400 mg total) by mouth in the morning. 180 tablet 3    HYSINGLA ER 20 mg TP24 Take 1 tablet by mouth every 24 hours      ipratropium (ATROVENT) 0.03 % nasal spray 2 sprays into each nostril.      lisinopriL (PRINIVIL,ZESTRIL) 20 MG tablet Take 1 tablet (20 mg total) by mouth daily. 30 tablet 6    metFORMIN (GLUCOPHAGE) 500 MG tablet TAKE 1 TABLET BY MOUTH 2 TIMES DAILY WITH MEALS 180 tablet 3    methylPREDNISolone (MEDROL DOSEPACK) 4 mg tablet follow package directions  1 each 0    MITIGARE 0.6 mg cap capsule Take 1 capsule (0.6 mg total) by mouth daily.      montelukast (SINGULAIR) 10 mg tablet Take 1 tablet (10 mg total) by mouth nightly. 90 tablet 0    NARCAN 4 mg/actuation nasal spray spray (4 mg) in 1 nostril may repeat dose every 2-3 minutes as needed alternating nostrils with each dose      promethazine (PHENERGAN) 25 MG tablet Take 1 tablet (25 mg total) by mouth every six (6) hours as needed for nausea. 30 tablet 3    propranoloL (INDERAL) 40 MG tablet Take 1 tablet (40 mg total) by mouth Two (2) times a day. 180 tablet 0    rifAMPin (RIFADIN) 300 MG capsule Take 1 capsule (300 mg total) by mouth two (2) times a day. 60 capsule 2    spironolactone (ALDACTONE) 25 MG tablet Take 1 tablet (25 mg total) by mouth daily. 90 tablet 0    syringe with needle (BD TUBERCULIN SYRINGE) 1 mL 25 gauge x 5/8 Syrg Use 1 syringe once a week for methotrexate injections. 50 each 7    topiramate (TOPAMAX) 50 MG tablet TAKE 1 TABLET BY MOUTH 2 TIMES DAILY  5    traZODone (DESYREL) 50 MG tablet Take 1 tablet (50 mg total) by mouth.      varenicline (CHANTIX PAK) 0.5 mg (11)- 1 mg (42) tablet Take one 0.5mg  tab once daily for 3 days,then increase to one 0.5mg  tab twice daily for 4 days,then increase to one 1mg  tab twice daily. 1 each 0    VYVANSE 30 mg capsule Take 1 capsule (30 mg total) by mouth two (2) times a day.  0     No current facility-administered medications for this visit.        Changes to medications: Brayley reports no changes at this time.    Allergies   Allergen Reactions    Other Hives, Itching and Other (See Comments)     High fever. Hives and itching all over the body.    Shellfish Containing Products Anaphylaxis    Sulfasalazine Hives    Toradol [Ketorolac] Hives    Venom-Honey Bee Anaphylaxis    Opioids - Morphine Analogues      Other reaction(s): Other  Other reaction(s): Other (See Comments)  Causes migraine  Headache    Tramadol Nausea And Vomiting    Hydromorphone Hcl      migraine    Naproxen      Grits teeth       Changes to allergies: No    SPECIALTY MEDICATION ADHERENCE     Humira 40  mg/0.28mL : 5 days of medicine on hand (she was not home to confirm if she had one pen at home)    Medication Adherence    Patient reported X missed doses in the last month: 0  Specialty Medication: Humira 40mg /0.27mL - every 7 days  Patient is on additional specialty medications: No  Patient is on more than two specialty medications: No  Any gaps in refill history greater than 2 weeks in the last 3 months: no  Demonstrates understanding of importance of adherence: yes  Informant: patient          Specialty medication(s) dose(s) confirmed: Regimen is correct and unchanged.     Are there any concerns with adherence? No    Adherence counseling provided? Not needed    CLINICAL MANAGEMENT AND INTERVENTION      Clinical Benefit Assessment:  Do you feel the medicine is effective or helping your condition? No - Ms. Fellman states that she feels she is in a HS flare. She states all of her joints are painful and she is having trouble completing her daily activities, including completing coursework for nursing school.    Clinical Benefit counseling provided?  I encouraged Ms. Nickolson to go to urgent care if pain continues to worsen. She verbalized understanding and stated she has an appt with pain clinic next month for follow-up.     Adverse Effects Assessment:    Are you experiencing any side effects? No    Are you experiencing difficulty administering your medicine? No    Quality of Life Assessment:    Quality of Life    Rheumatology  Oncology  Dermatology  Cystic Fibrosis          How many days over the past month did your HS  keep you from your normal activities? For example, brushing your teeth or getting up in the morning. Every day - Ms. Liz reports joint pain all over impacting ability to complete ADLs.    Have you discussed this with your provider? Yes    Acute Infection Status:    Acute infections noted within Epic:  No active infections  Patient reported infection: None    Therapy Appropriateness:    Is therapy appropriate and patient progressing towards therapeutic goals? Yes, therapy is appropriate and should be continued    DISEASE/MEDICATION-SPECIFIC INFORMATION      For patients on injectable medications: Patient currently has 1 doses left.  Next injection is scheduled for 6/11.    PATIENT SPECIFIC NEEDS     Does the patient have any physical, cognitive, or cultural barriers? No    Is the patient high risk? No    Does the patient require a Care Management Plan? No     SOCIAL DETERMINANTS OF HEALTH     At the [facility], we have learned that life circumstances - like trouble affording food, housing, utilities, or transportation can affect the health of many of our patients.   That is why we wanted to ask: are you currently experiencing any life circumstances that are negatively impacting your health and/or quality of life? Yes - Ms. Hagan states food stamps quantity she was allotted was recently reduced. She states she will do research on why this occurred if she could more for next month.    Social Determinants of Psychologist, prison and probation services Strain: Not on file   Internet Connectivity: Not on file   Food Insecurity: Not on file   Tobacco Use: High Risk    Smoking Tobacco Use: Every Day    Smokeless Tobacco Use: Never    Passive Exposure: Not on file   Housing/Utilities: Not on file   Alcohol Use: Not on file   Transportation Needs: Not on file   Substance Use: Not on file   Health Literacy: Not on file   Physical Activity: Not on file   Interpersonal Safety: Not on file   Stress: Not on file   Intimate Partner Violence: Not on file   Depression: Not at risk    PHQ-2 Score: 0   Social Connections: Not on file       Would you be willing to receive help with any of the needs that you have identified today? No       SHIPPING     Specialty Medication(s) to be Shipped:  Inflammatory Disorders: Humira    Other medication(s) to be shipped:  hydroxychloroquine 200mg  and promethazine 25 mg     Changes to insurance: No    Delivery Scheduled: Yes, Expected medication delivery date: 06/07/22.     Medication will be delivered via UPS to the confirmed prescription address in [facility].    The patient will receive a drug information handout for each medication shipped and additional FDA Medication Guides as required.  Verified that patient has previously received a Conservation officer, historic buildings and a Surveyor, mining.    The patient or caregiver noted above participated in the development of this care plan and knows that they can request review of or adjustments to the care plan at any time.      All of the patient's questions and concerns have been addressed.    Oliva Bustard   [facility] Specialty Pharmacist

## 2022-06-06 MED FILL — PROMETHAZINE 25 MG TABLET: ORAL | 8 days supply | Qty: 30 | Fill #2

## 2022-06-06 MED FILL — HYDROXYCHLOROQUINE 200 MG TABLET: ORAL | 30 days supply | Qty: 60 | Fill #11

## 2022-06-06 MED FILL — HUMIRA PEN CITRATE FREE 40 MG/0.4 ML: SUBCUTANEOUS | 28 days supply | Qty: 4 | Fill #2

## 2022-06-20 NOTE — Unmapped (Signed)
LVM multiple attempts have been made to rsc 7/19 which has been bumped by Dr. Janyth Contes.  Appointment was cancelled and moved to 7/13 at 11:15 for laser.  If patient needs to rescheduled contact Aram Beecham at [phone].

## 2022-06-25 ENCOUNTER — Ambulatory Visit
Admit: 2022-06-25 | Discharge: 2022-06-25 | Disposition: A | Discharge status: Home / Self Care | Payer: PRIVATE HEALTH INSURANCE

## 2022-06-25 DIAGNOSIS — M359 Systemic involvement of connective tissue, unspecified: Principal | ICD-10-CM

## 2022-06-25 DIAGNOSIS — L0291 Cutaneous abscess, unspecified: Principal | ICD-10-CM

## 2022-06-25 LAB — CBC W/ AUTO DIFF
BASOPHILS ABSOLUTE COUNT: 0.1 10*9/L (ref 0.0–0.4)
BASOPHILS RELATIVE PERCENT: 0.9 %
EOSINOPHILS ABSOLUTE COUNT: 0.2 10*9/L (ref 0.0–0.7)
EOSINOPHILS RELATIVE PERCENT: 1.4 %
HEMATOCRIT: 45.7 %
HEMOGLOBIN: 14.8 g/dL
IMMATURE GRANULOCYTES ABSOLUTE COUNT: 0.1 10*9/L (ref 0.0–0.6)
IMMATURE GRANULOCYTES RELATIVE PERCENT: 0.5 %
LYMPHOCYTES ABSOLUTE COUNT: 3.1 10*9/L (ref 1.2–3.4)
LYMPHOCYTES RELATIVE PERCENT: 27.8 %
MEAN CORPUSCULAR HEMOGLOBIN CONC: 32.4 g/dL (ref 31.0–35.5)
MEAN CORPUSCULAR HEMOGLOBIN: 28.4 pg (ref 26.0–34.0)
MEAN CORPUSCULAR VOLUME: 87.7 fL
MEAN PLATELET VOLUME: 10.6 fL — ABNORMAL HIGH (ref 7.0–10.0)
MONOCYTES ABSOLUTE COUNT: 0.6 10*9/L — ABNORMAL HIGH (ref 0.1–0.5)
MONOCYTES RELATIVE PERCENT: 5.7 %
NEUTROPHILS ABSOLUTE COUNT: 7.2 10*9/L — ABNORMAL HIGH (ref 1.4–6.5)
NEUTROPHILS RELATIVE PERCENT: 63.7 %
PLATELET COUNT: 247 10*9/L (ref 130–400)
RED BLOOD CELL COUNT: 5.21 10*12/L
RED CELL DISTRIBUTION WIDTH: 12.4 % (ref 11.5–15.5)
WBC ADJUSTED: 11.2 10*9/L — ABNORMAL HIGH (ref 4.8–10.8)

## 2022-06-25 MED ORDER — CLINDAMYCIN HCL 300 MG CAPSULE
ORAL_CAPSULE | Freq: Four times a day (QID) | ORAL | 0 refills | 14 days | Status: CP
Start: 2022-06-25 — End: 2022-07-09

## 2022-06-25 MED ORDER — HYDROXYCHLOROQUINE 200 MG TABLET
ORAL_TABLET | Freq: Every day | ORAL | 3 refills | 90 days | Status: CP
Start: 2022-06-25 — End: ?
  Filled 2022-07-09: qty 180, 90d supply, fill #0

## 2022-06-25 MED ADMIN — acetaminophen (TYLENOL) tablet 975 mg: 1000 mg | ORAL | @ 13:00:00 | Stop: 2022-06-25

## 2022-06-25 MED ADMIN — lidocaine-EPINEPHrine 1 %-1:200,000 injection 5 mL: 5 mL | @ 13:00:00 | Stop: 2022-06-25

## 2022-06-25 MED ADMIN — clindamycin (CLEOCIN) capsule 300 mg: 300 mg | ORAL | @ 14:00:00 | Stop: 2022-06-25

## 2022-06-25 NOTE — Unmapped (Signed)
EMERGENCY MEDICINE      CHIEF COMPLAINT:  Chief Complaint   Patient presents with    Abscess     Pt c/o abscess to left hip x 3 days. Pt denies any drainage.       HPI   Kim Charles is a 41 y.o. female with PMHx significant for ADHD, hidradenitis suppurativa fibromyalgia, hypertension, COPD who presents to the ED for evaluation of abscess.Patient presents emergency department complaining of abscess on her posterior left thigh.  Patient has a extensive history of autoimmune diseases including hidradenitis suppurativa, fibromyalgia, lupus, prediabetic.  Patient noticed a sore area of her posterior thigh 3 days ago, she has attempted to do warm baths with no relief.  Patient states that she missed her dose of Humira that normally helps with her HS flares she has contacted her rheumatologist but has not heard back.  Patient denies any fever or chills.  Patient has had several abscesses requiring I&D and antibiotics.      PAST MEDICAL HISTORY:  Past Medical History:   Diagnosis Date    ADHD (attention deficit hyperactivity disorder)     Allergic 2007    Allergic to shrimp    Anemia     Asthma     Chronic pain     Disorder of skin or subcutaneous tissue 2001    Hidradenitis suprative    Fibromyalgia     Hidradenitis suppurativa     Hypertension     Leukemia (CMS-HCC)     Lung disease 2017    COPD    Lupus (systemic lupus erythematosus) (CMS-HCC)     Migraines     Osteoarthritis     Scoliosis         PAST SURGICAL HISTORY:   has a past surgical history that includes Cesarean section; Partial hysterectomy; and Laparoscopic endometriosis fulguration.    MEDICATIONS:  Prior to Admission medications    Medication Sig Start Date End Date Taking? Authorizing Provider   albuterol (PROVENTIL HFA;VENTOLIN HFA) 90 mcg/actuation inhaler Inhale 2 puffs every six (6) hours as needed. 05/17/22   Wilfred Curtis, FNP   amLODIPine (NORVASC) 10 MG tablet Take 1 tablet (10 mg total) by mouth daily. 04/26/22 04/26/23  Selena Hammonds McNeill, FNP   aspirin-acetaminophen-caffeine (EXCEDRIN MIGRAINE) 250-250-65 mg per tablet Take 2 tablets by mouth.    Historical Provider, MD   beclomethasone (QVAR) 40 mcg/actuation inhaler Inhale 2 puffs daily as needed. 03/09/15   Historical Provider, MD   benzonatate (TESSALON) 200 MG capsule Take 1 capsule (200 mg total) by mouth Three (3) times a day as needed for cough. 09/28/21   Chari Manning, DNP   cetirizine (ZYRTEC) 10 MG tablet Take 1 tablet (10 mg total) by mouth daily. 05/17/22 06/16/22  Wilfred Curtis, FNP   clindamycin (CLEOCIN) 300 MG capsule 300mg  twice daily  Patient not taking: Reported on 05/17/2022 08/16/21   Elsie Stain, MD   cloNIDine HCL (CATAPRES) 0.1 MG tablet Take 1 tablet (0.1 mg total) by mouth Three (3) times a day. 04/26/22   Selena Hammonds McNeill, FNP   cyclobenzaprine (FLEXERIL) 10 MG tablet Take 1 tablet (10 mg total) by mouth two (2) times a day as needed for muscle spasms. 04/22/22   Jeannene Patella, DO   dexlansoprazole (DEXILANT) 60 mg capsule Take 1 capsule (60 mg total) by mouth daily. 06/11/22   Selena Berdine Dance, FNP   empty container (SHARPS CONTAINER) Misc Use as directed  Patient not taking: Reported on  05/17/2022 09/29/20   Liz Malady, MD   empty container Misc Use as directed  Patient not taking: Reported on 05/17/2022 11/08/21   Ardith Dark, MD   empty container Misc Use as directed  Patient not taking: Reported on 05/17/2022 03/12/22   Ardith Dark, MD   EPINEPHrine [facility]) 0.3 mg/0.3 mL injection Inject 0.3 mL (0.3 mg total) into the muscle. 07/08/15   Historical Provider, MD   escitalopram oxalate (LEXAPRO) 10 MG tablet Take 1 tablet (10 mg total) by mouth daily. 04/29/18   Historical Provider, MD   fluticasone propionate (FLOVENT HFA) 220 mcg/actuation inhaler 2 puffs. 11/05/17   Historical Provider, MD   gabapentin (NEURONTIN) 300 MG capsule Take 1 capsule (300 mg total) by mouth Three (3) times a day.    Historical Provider, MD   HUMIRA PEN CITRATE FREE 40 MG/0.4 ML Inject the contents of 1 pen (40 mg total) under the skin every seven (7) days. 03/12/22   Ardith Dark, MD   hydroCHLOROthiazide (HYDRODIURIL) 50 MG tablet Take 1 tablet (50 mg total) by mouth daily. 04/26/22 04/26/23  Selena Hammonds McNeill, FNP   HYDROcodone-acetaminophen (NORCO 10-325) 10-325 mg per tablet Take 1 tablet by mouth 5 times daily as needed for pain 10/20/19   Historical Provider, MD   hydrOXYchloroQUINE (PLAQUENIL) 200 mg tablet Take 2 tablets (400 mg total) by mouth in the morning. 06/08/21   Staci Righter, PA   [facility] 20 mg TP24 Take 1 tablet by mouth every 24 hours 10/01/19   Historical Provider, MD   ipratropium (ATROVENT) 0.03 % nasal spray 2 sprays into each nostril.    Historical Provider, MD   lisinopriL (PRINIVIL,ZESTRIL) 20 MG tablet Take 1 tablet (20 mg total) by mouth daily. 04/26/22 11/22/22  Selena Hammonds McNeill, FNP   metFORMIN (GLUCOPHAGE) 500 MG tablet TAKE 1 TABLET BY MOUTH 2 TIMES DAILY WITH MEALS 01/19/20   Kirke Shaggy, MD   methylPREDNISolone (MEDROL DOSEPACK) 4 mg tablet follow package directions 05/17/22   Wilfred Curtis, FNP   MITIGARE 0.6 mg cap capsule Take 1 capsule (0.6 mg total) by mouth daily. 12/22/18   Historical Provider, MD   montelukast (SINGULAIR) 10 mg tablet Take 1 tablet (10 mg total) by mouth nightly. 09/28/21   Chari Manning, DNP   NARCAN 4 mg/actuation nasal spray spray (4 mg) in 1 nostril may repeat dose every 2-3 minutes as needed alternating nostrils with each dose 08/19/19   Historical Provider, MD   promethazine (PHENERGAN) 25 MG tablet Take 1 tablet (25 mg total) by mouth every six (6) hours as needed for nausea. 02/08/22   Rumey Sherie Don, MD   propranoloL (INDERAL) 40 MG tablet Take 1 tablet (40 mg total) by mouth Two (2) times a day. 04/26/22   Selena Hammonds McNeill, FNP   rifAMPin (RIFADIN) 300 MG capsule Take 1 capsule (300 mg total) by mouth two (2) times a day. 10/10/21   Ardith Dark, MD   spironolactone (ALDACTONE) 25 MG tablet Take 1 tablet (25 mg total) by mouth daily. 04/26/22   Selena Hammonds McNeill, FNP   syringe with needle (BD TUBERCULIN SYRINGE) 1 mL 25 gauge x 5/8 Syrg Use 1 syringe once a week for methotrexate injections. 03/01/21   Ethlyn Gallery, MD   topiramate (TOPAMAX) 50 MG tablet TAKE 1 TABLET BY MOUTH 2 TIMES DAILY 04/29/18   Historical Provider, MD   traZODone (DESYREL) 50 MG tablet Take 1 tablet (50  mg total) by mouth. 09/26/17   Historical Provider, MD   varenicline (CHANTIX PAK) 0.5 mg (11)- 1 mg (42) tablet Take one 0.5mg  tab once daily for 3 days,then increase to one 0.5mg  tab twice daily for 4 days,then increase to one 1mg  tab twice daily. 04/26/22 07/25/22  Selena Hammonds McNeill, FNP   VYVANSE 30 mg capsule Take 1 capsule (30 mg total) by mouth two (2) times a day. 04/24/18   Historical Provider, MD       ALLERGIES:  Allergies   Allergen Reactions    Other Hives, Itching and Other (See Comments)     High fever. Hives and itching all over the body.    Shellfish Containing Products Anaphylaxis    Sulfasalazine Hives    Toradol [Ketorolac] Hives    Venom-Honey Bee Anaphylaxis    Opioids - Morphine Analogues      Other reaction(s): Other  Other reaction(s): Other (See Comments)  Causes migraine  Headache    Tramadol Nausea And Vomiting    Hydromorphone Hcl      migraine    Naproxen      Grits teeth       FAMILY HISTORY:  Family History   Problem Relation Age of Onset    Thyroid disease Mother     Diabetes Mother     Lupus Father         My fathers nephew and sister    Diabetes Father     Cancer Sister     Diabetes Brother     Cancer Maternal Uncle     Osteoarthritis Paternal Aunt     Stroke Paternal Uncle     Cancer Maternal Grandmother     Basal cell carcinoma Neg Hx     Melanoma Neg Hx     Squamous cell carcinoma Neg Hx     Glaucoma Neg Hx     Macular degeneration Neg Hx        SOCIAL HISTORY:  Social History     Tobacco Use    Smoking status: Every Day Packs/day: 1.00     Years: 15.00     Pack years: 15.00     Types: Cigarettes    Smokeless tobacco: Never   Substance Use Topics    Alcohol use: No       REVIEW OF SYMPTOMS:   Review of Systems   Constitutional:  Negative for chills and fever.   HENT:  Negative for congestion and sore throat.    Eyes:  Negative for pain, redness, itching and visual disturbance.   Respiratory:  Negative for cough, shortness of breath and wheezing.    Cardiovascular:  Negative for chest pain and leg swelling.   Gastrointestinal:  Negative for abdominal pain, constipation, diarrhea, nausea and vomiting.   Genitourinary:  Negative for difficulty urinating, frequency and urgency.   Musculoskeletal:  Negative for back pain, myalgias and neck pain.   Skin:  Positive for wound. Negative for rash.   Neurological:  Negative for dizziness, weakness, light-headedness and headaches.     PHYSICAL EXAM:   Vitals:    06/25/22 0839   BP: 150/102   Pulse: 105   Resp: 18   Temp: 36.7 ??C (98.1 ??F)   TempSrc: Oral   SpO2: 97%   Weight: 77.6 kg (171 lb)   Height: 154.9 cm (5' 1)     Physical Exam  Constitutional:       Appearance: Normal appearance.   HENT:      Head:  Normocephalic and atraumatic.   Eyes:      Extraocular Movements: Extraocular movements intact.      Conjunctiva/sclera: Conjunctivae normal.   Cardiovascular:      Rate and Rhythm: Normal rate and regular rhythm.      Pulses: Normal pulses.      Heart sounds: Normal heart sounds.   Pulmonary:      Effort: Pulmonary effort is normal.      Breath sounds: Normal breath sounds.   Abdominal:      General: Bowel sounds are normal.      Palpations: Abdomen is soft.   Musculoskeletal:         General: Normal range of motion.      Cervical back: Normal range of motion and neck supple.   Skin:     General: Skin is warm and dry.      Capillary Refill: Capillary refill takes less than 2 seconds.      Comments: Erythematous warm fluctuant abscess on posterior left thigh   Neurological:      General: No focal deficit present.      Mental Status: She is alert and oriented to person, place, and time. Mental status is at baseline.   Psychiatric:         Mood and Affect: Mood normal.         Behavior: Behavior normal.       DIAGNOSTICS:     EKG: No results found for this or any previous visit (from the past 4464 hour(s)).     Imaging:  No orders to display       Labs:  Labs Reviewed   CBC W/ AUTO DIFF - Abnormal; Notable for the following components:       Result Value    WBC 11.2 (*)     MPV 10.6 (*)     Absolute Neutrophils 7.2 (*)     Absolute Monocytes 0.6 (*)     All other components within normal limits   POCT GLUCOSE, INTERFACED - Normal   CBC W/ DIFFERENTIAL    Narrative:     The following orders were created for panel order CBC w/ Differential.  Procedure                               Abnormality         Status                     ---------                               -----------         ------                     CBC w/ Differential[([id number] [id number]]         Abnormal            Final result                 Please view results for these tests on the individual orders.       ED COURSE AND MEDICAL DECISION MAKING:   Patient???s condition improved  during Emergency Department evaluation.     Old records reviewed.     Orders Placed This Encounter   Procedures    INCISION AND DRAINAGE    CBC w/ Differential  Glucose - POCT RN Obtain       Medications   clindamycin (CLEOCIN) capsule 300 mg (has no administration in time range)   acetaminophen (TYLENOL) tablet 975 mg (975 mg Oral Given 06/25/22 0920)   lidocaine-EPINEPHrine 1 %-1:200,000 injection 5 mL (5 mL Other Given 06/25/22 0926)           Incision/Drainage    Date/Time: 06/25/2022 9:42 AM  Performed by: Jorje Guild, MD  Authorized by: Shanon Brow, DO     Consent:     Consent obtained:  Verbal    Consent given by:  Patient    Risks, benefits, and alternatives were discussed: yes      Risks discussed:  Bleeding, infection, damage to other organs, incomplete drainage and pain  Universal protocol:     Procedure explained and questions answered to patient or proxy's satisfaction: yes      Relevant documents present and verified: yes      Test results available : yes      Imaging studies available: yes      Required blood products, implants, devices, and special equipment available: yes      Site/side marked: yes      Immediately prior to procedure, a time out was called: yes      Patient identity confirmed:  Arm band and verbally with patient  Location:     Type:  Abscess    Size:  Posterior thigh    Location:  Lower extremity    Lower extremity location:  Leg    Leg location:  L upper leg  Pre-procedure details:     Skin preparation:  Chlorhexidine  Sedation:     Sedation type:  None  Anesthesia:     Anesthesia method:  Local infiltration    Local anesthetic:  Lidocaine 1% WITH epi  Procedure type:     Complexity:  Simple  Procedure details:     Ultrasound guidance: yes      Incision types:  Stab incision    Incision depth:  Dermal    Wound management:  Probed and deloculated and irrigated with saline    Drainage:  Bloody and purulent    Drainage amount:  Scant    Wound treatment:  Wound left open    Packing materials:  None  Post-procedure details:     Procedure completion:  Tolerated    Medical Decision Making      ED Course as of 06/25/22 0953   Mon Jun 25, 2022   0856 Patient presents emergency department complaining of abscess on her posterior left thigh.  Patient has a extensive history of autoimmune diseases including hidradenitis suppurativa, fibromyalgia, lupus, prediabetic.  Patient noticed a sore area of her posterior thigh 3 days ago, she has attempted to do warm baths with no relief.  Patient states that she missed her dose of Humira that normally helps with her HS flares she has contacted her rheumatologist but has not heard back.  Patient denies any fever or chills.  Patient has had several abscesses requiring I&D.    Physical exam  Erythematous tender area on posterior thigh with pustular but no drainage.   0944 Abscess was visualized with bedside ultrasound.  Incision and drainage with scant bloody and purulent discharge.  Incision was left open  Patient tolerated procedure well   0945 Will discharge patient with clindamycin due to her recurrent abscesses and risk factors.   0945 Glucose, POC: 90   0947 Patient was instructed to  follow-up with her primary care and rheumatologist for her Humira shot.     Refer to ED course for more detailed work-up       FINAL IMPRESSION:   Final diagnoses:   Abscess (Primary)       CONDITION ON DISPOSITION:   Stable    PLAN AND FOLLOW UP:     Discharge home with symptomatic management. Written information on pt??s diagnosis given. Additional verbal discharge instructions were given and discussed with the patient. ED return precautions given and discussed with pt, including instructions to return if symptoms worsen or do not improve within the next 1-2 days. Pt understands and agrees with plan. All questions answered at this time. Pt instructed to f/u with PCP in 1-2 days and specialists as discussed. If unable to obtain follow up in this time frame and symptoms have worsened or have not improved, return to the ED.          Rx:   Medications Prescribed Today               clindamycin (CLEOCIN) 300 MG capsule Take 1 capsule (300 mg total) by mouth four (4) times a day for 14 days.            Portions of this chart were completed with the use of Dragon.  While it has been reviewed for accuracy, it may contain grammatical and clerical errors.    The patient was seen, evaluated and managed by ED Resident Physician Salem Caster, MD, PGY-1 with shared decision making and direct supervision by ED attending [facility], DO     Jorje Guild, MD  Resident  06/25/22 [phone]

## 2022-06-25 NOTE — Unmapped (Signed)
HCQ refill  Last Visit Date: 03/09/2022  Next Visit Date: 07/11/2022

## 2022-06-25 NOTE — Unmapped (Signed)
Pt c/o abscess to left hip x 3 days. Pt denies any drainage.

## 2022-07-05 NOTE — Unmapped (Signed)
[facility] Refill Coordination Note    Specialty Medication(s) to be Shipped:   Inflammatory Disorders: Humira    Other medication(s) to be shipped: HydrOXYchloroQUINE 200 mg tablet & Promethazine 25 MG tablet     Kim Charles, DOB: Nov 16, 1981  Phone: [phone] (home)       All above HIPAA information was verified with patient.     Was a Nurse, learning disability used for this call? No    Completed refill call assessment today to schedule patient's medication shipment from the [facility] [phone]).  All relevant notes have been reviewed.     Specialty medication(s) and dose(s) confirmed: Regimen is correct and unchanged.   Changes to medications: Kim Charles reports no changes at this time.  Changes to insurance: No  New side effects reported not previously addressed with a pharmacist or physician: None reported  Questions for the pharmacist: No    Confirmed patient received a Conservation officer, historic buildings and a Surveyor, mining with first shipment. The patient will receive a drug information handout for each medication shipped and additional FDA Medication Guides as required.       DISEASE/MEDICATION-SPECIFIC INFORMATION        For patients on injectable medications: Patient currently has 1 doses left.  Next injection is scheduled for 07/08/2022.    SPECIALTY MEDICATION ADHERENCE     Medication Adherence    Patient reported X missed doses in the last month: 0  Specialty Medication: Humira  Patient is on additional specialty medications: No  Any gaps in refill history greater than 2 weeks in the last 3 months: no  Demonstrates understanding of importance of adherence: yes  Informant: patient  Reliability of informant: reliable  Confirmed plan for next specialty medication refill: delivery by pharmacy  Refills needed for supportive medications: not needed              Were doses missed due to medication being on hold? No    Humira 40/0.4 mg/ml: 7 days of medicine on hand         REFERRAL TO PHARMACIST     Referral to the pharmacist: Not needed      [facility]     Shipping address confirmed in Epic.     Delivery Scheduled: Yes, Expected medication delivery date: 07/10/2022.     Medication will be delivered via UPS to the prescription address in Epic WAM.    Kim Charles D Connie Lasater   [facility] Specialty Technician

## 2022-07-09 MED FILL — PROMETHAZINE 25 MG TABLET: ORAL | 8 days supply | Qty: 30 | Fill #3

## 2022-07-09 MED FILL — HUMIRA PEN CITRATE FREE 40 MG/0.4 ML: SUBCUTANEOUS | 28 days supply | Qty: 4 | Fill #3

## 2022-07-11 ENCOUNTER — Ambulatory Visit: Admit: 2022-07-11 | Discharge: 2022-07-11 | Payer: PRIVATE HEALTH INSURANCE

## 2022-07-11 DIAGNOSIS — L732 Hidradenitis suppurativa: Principal | ICD-10-CM

## 2022-07-11 DIAGNOSIS — Z79899 Other long term (current) drug therapy: Principal | ICD-10-CM

## 2022-07-11 DIAGNOSIS — R059 Cough, unspecified type: Principal | ICD-10-CM

## 2022-07-11 DIAGNOSIS — M359 Systemic involvement of connective tissue, unspecified: Principal | ICD-10-CM

## 2022-07-11 DIAGNOSIS — G5603 Carpal tunnel syndrome, bilateral upper limbs: Principal | ICD-10-CM

## 2022-07-11 DIAGNOSIS — M797 Fibromyalgia: Principal | ICD-10-CM

## 2022-07-11 LAB — URINALYSIS WITH MICROSCOPY WITH CULTURE REFLEX
BILIRUBIN UA: NEGATIVE
BLOOD UA: NEGATIVE
GLUCOSE UA: NEGATIVE
KETONES UA: NEGATIVE
NITRITE UA: NEGATIVE
PH UA: 6 (ref 5.0–9.0)
PROTEIN UA: NEGATIVE
RBC UA: 0 /HPF (ref 0–3)
SPECIFIC GRAVITY UA: 1.025 (ref 1.005–1.030)
SQUAMOUS EPITHELIAL: 90 /HPF — ABNORMAL HIGH (ref 0–5)
UROBILINOGEN UA: 0.2
WBC UA: 40 /HPF — ABNORMAL HIGH (ref 0–3)

## 2022-07-11 LAB — CREATININE
CREATININE: 0.67 mg/dL
EGFR CKD-EPI (2021) FEMALE: >90 mL/min/{1.73_m2} (ref >=60)

## 2022-07-11 LAB — PROTEIN / CREATININE RATIO, URINE
CREATININE, URINE: 136.1 mg/dL
PROTEIN URINE: 14.9 mg/dL
PROTEIN/CREAT RATIO, URINE: 0.109

## 2022-07-11 LAB — ALT: ALT (SGPT): <7 U/L — ABNORMAL LOW (ref 10–49)

## 2022-07-11 LAB — C3 COMPLEMENT: C3 COMPLEMENT: 92 mg/dL (ref 90–170)

## 2022-07-11 LAB — AST: AST (SGOT): <8 U/L (ref <=34)

## 2022-07-11 LAB — C4 COMPLEMENT: C4 COMPLEMENT: 9.8 mg/dL — ABNORMAL LOW (ref 12.0–36.0)

## 2022-07-11 NOTE — Unmapped (Addendum)
No change in medication    Due for eye exam. Please call [facility] to schedule as last done August 2022. [phone].     Suspect you have carpal tunnel syndrome. Ordered for electromyography nerve conduction studies.  EMG/N[facility] labs will call you to schedule.     Your pain today is coming from fibromyalgia.  Fibromyalgia is a centralized chronic pain condition (meaning the pain arises from the brain). This is not caused by inflammation in the body, and is not an autoimmune process. Fibromyalgia is not completely understood, but we think the pain comes from over-active nerves, which cause the sensation of pain despite there being no stimulus (no inflammation in the body) to cause the pain.  Pain and tenderness to touch can be widespread over the body.     Fibromyalgia is also often characterized by fatigue or cognitive inhibition (brain fog). Depression and anxiety are common in fibromyalgia patients, and these conditions may precede the onset of the fibromyalgia pain.     Management of fibromyalgia can be broken into pharmacologic (using medication) and non-pharmacologic (not using medications) treatments. The non-pharmacologic treatments are very important for the control of fibromyalgia pain. Medications can be helpful for managing fibromyalgia symptoms in some, but are not necessary for all patients. Please discuss these treatments with your primary care doctor.     Non-Pharmacologic (without medications) Pharmacologic  (with medications)   3 main treatments for fibromyalgia  Good restorative sleep--Careful sleep hygiene, consider sleep study to evaluate other issues that may hinder sleep (like sleep apnea)  Regular, graded, low impact cardiovascular exercise--Walking/biking etc. Water aerobics can be particularly helpful.  Start with a small amount of exercise (eg 10-15 minutes of walking every 2-3 days) then work your way up.   Management of any depression or anxiety that you may have--Often very difficult to control pain when depression/anxiety is not controlled.     Other helpful options  Pain psychology/counseling/ cognitive behavioral therapy (CBT)--Very helpful at finding triggers that can make pain worse, and developing coping strategies.   Mindfulness Training- Helpful as a coping strategy. Available through the [facility] Medicine clinic (can call to inquire or schedule appointment [phone])  Physical therapy/ exercise therapy   Massage therapy  Acupuncture   Tai Chi  Yoga FDA approved medications  Lyrica (pregabalin)   Cymbalta (duloxetine)  Savella (milnacipran)    Medications used ???off-label,??? meaning not FDA approved, but are used often for fibromyalgia  Antidepressants     Anticonvulsants (anti-seizure)  NSAID medications (like ibuprofen, naproxen, etc)  Muscle relaxers  Pain relievers   Tylenol  Lidocaine patches  Mentholated creams (like Icy-Hot or BioFreeze)    ** Tip: Fibromyalgia patients are susceptible to low vitamin D levels.  They are sometimes more sensitive to low vitamin D, which can increase fatigue and overall aching.  Supplementation of vitamin D when low can be helpful in managing symptoms.  ** Opioid pain medications, like oxycodone and hydrocodone, are not recommended for the long term management of fibromyalgia pain.        More information about fibromyalgia can be found at [email or web address]
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
## 2022-07-12 DIAGNOSIS — M797 Fibromyalgia: Principal | ICD-10-CM

## 2022-07-12 LAB — ANTI-DNA ANTIBODY, DOUBLE-STRANDED: DSDNA ANTIBODY: NEGATIVE

## 2022-07-13 DIAGNOSIS — N390 Urinary tract infection, site not specified: Principal | ICD-10-CM

## 2022-07-13 LAB — VITAMIN D 25 HYDROXY: VITAMIN D, TOTAL (25OH): 25.6 ng/mL (ref 20.0–80.0)

## 2022-07-13 LAB — EXTRACTABLE NUCLEAR ANTIGEN: ENA SCREEN: 7.9 ENA Units — ABNORMAL HIGH (ref <0.70)

## 2022-07-13 MED ORDER — CIPROFLOXACIN 500 MG TABLET
ORAL_TABLET | Freq: Two times a day (BID) | ORAL | 0 refills | 10 days | Status: CP
Start: 2022-07-13 — End: 2022-07-23

## 2022-07-13 NOTE — Unmapped (Signed)
Called patient as notified by patient relations that she was unhappy with visit yesterday. Discussed with patient that I was reaching out to address her concerns. Patient states she is hurting all over and that she felt like her pain had not been properly addressed and she felt like lupus is etiology of pain. Patient reports that today was stressful as she went to her pain management team at [facility] in [place] and was immediately dismissed as a urine drug screen (unclear if done today or prior, as unable to find results in EHR) had shown positivity for cocaine. 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 She did have poppy seed on some food recently. Discussed poppy seed doesn't lead to positive cocaine. Patient would like to clear her name and requesting to repeat an FDA approved urine drug screen. She reports pain management clinic using a test not FDA approved. Patient reports having food stamps recently limited as well and she is having difficulty providing for her child. Pain is worsening with all these stressors.     I let patient share her concerns and share her story as noted above. We then discussed the following:  1. Her exam yesterday was notable for good full ROM of all joints with no limitations or evidence of swelling/effusion. She had minimal tenderness at the joints themselves on exam. Where she was very tender was along her myofascial trigger points which is consistent for Fibromyalgia or Centralized Pain Amplification process. Having stressors as she is experiencing would exacerbate her pain syndrome which we acknowledged was real symptoms.  2. We discussed management of fibromyalgia focused on improving sleep hygiene, exercise and mood. She states she is already on lexapro and gabapentin. She does not feel like she needs to add to this regimen.  3. We discussed that she is at risk for SLE as she has been noted to have abnormal serologies in the past and after being on Humira. However, we have not yet noted objective signs for SLE but we are monitoring. Reviewed labs from yesterday. C4 slightly low, C3 normal. Negative dsDNA. Complement levels improved compared to prior. CBC and Cr, AST and ALT wnl. Therefore, no signs for lupus from labs.  4. Urinalysis concerning for possible UTI. Urine culture pending. Will follow-up with patient. Patient reports recurrent UTI. Discussed possible need for UroGyn evaluation. She will consider and discuss with PCP.  5. She would like to complete another urine drug screen. Order placed to [facility] as she plans to complete at [facility]. Offered to place referral to pain management but she wants to discuss with PCP. Discussed that for fibroymyalgia we minimize use of opiates. She reports hydrocodone long standing.      After this discussion and reviewing labs and reasoning for lupus not being etiology of her pain but fibromyalgia, patient agreed her concerns were addressed. She agreed to continue to follow with me and to be scheduled as we discussed 8  Months from now with a visit with [facility] Pavlov [facility] in the interim at 4 months.     Discussed I would follow-up with her about pending urine culture and once she complete urine drug screen as well.   She appreciated phone call.

## 2022-07-13 NOTE — Unmapped (Signed)
Called patient. Discussed citrobacter koseri in urine. Will treat with antibiotic. Allergies to sulfa drugs. Will treat with ciprofloxacin. Advised her to take full course to prevent antibiotic resistance. She agreed and appreciated phone call.

## 2022-07-17 LAB — ENA PANEL
ANTI-CENP AB: NEGATIVE
ANTI-JO1 AB: NEGATIVE
ANTI-RNP AB: NEGATIVE
ANTI-SCL-70 AB: NEGATIVE
ANTI-SM AB: NEGATIVE
ANTI-SSA AB: POSITIVE — AB
ANTI-SSB AB: NEGATIVE

## 2022-07-17 NOTE — Unmapped (Signed)
Called patient in response to her mychart message sent to follow-up as had additional questions for patient to clarify situation. No response. Left message of my attempt. Message returned on mychart as well.

## 2022-07-20 NOTE — Unmapped (Signed)
Called patient as she had reached out earlier in the week via mychart but did not respond to my message or prior phone call. No answer. Left message of my attempt.

## 2022-08-02 NOTE — Unmapped (Signed)
[facility] Refill Coordination Note    Specialty Medication(s) to be Shipped:   Inflammatory Disorders: Actemra and Humira    Other medication(s) to be shipped: No additional medications requested for fill at this time     Murray Hodgkins, DOB: Nov 05, 1981  Phone: [phone] (home)       All above HIPAA information was verified with patient.     Was a Nurse, learning disability used for this call? No    Completed refill call assessment today to schedule patient's medication shipment from the [facility] [phone]).  All relevant notes have been reviewed.     Specialty medication(s) and dose(s) confirmed: Regimen is correct and unchanged.   Changes to medications: Aliviah reports no changes at this time.  Changes to insurance: No  New side effects reported not previously addressed with a pharmacist or physician: None reported  Questions for the pharmacist: No    Confirmed patient received a Conservation officer, historic buildings and a Surveyor, mining with first shipment. The patient will receive a drug information handout for each medication shipped and additional FDA Medication Guides as required.       DISEASE/MEDICATION-SPECIFIC INFORMATION        For patients on injectable medications: Patient currently has 1 doses left.  Next injection is scheduled for 08/02/22.    SPECIALTY MEDICATION ADHERENCE     Medication Adherence    Patient reported X missed doses in the last month: 0  Specialty Medication: Humira  Patient is on additional specialty medications: No  Informant: patient  Reliability of informant: reliable              Were doses missed due to medication being on hold? No    HUMIRA(CF) PEN 40 mg/0.4  mg/ml: 1 days of medicine on hand       REFERRAL TO PHARMACIST     Referral to the pharmacist: Not needed      [facility]     Shipping address confirmed in Epic.     Delivery Scheduled: Yes, Expected medication delivery date: 08/09/22.     Medication will be delivered via UPS to the prescription address in Epic WAM.    Ernestine Mcmurray   [facility] Specialty Technician

## 2022-08-08 ENCOUNTER — Encounter (INDEPENDENT_AMBULATORY_CARE_PROVIDER_SITE_OTHER): Payer: Self-pay

## 2022-08-09 DIAGNOSIS — L732 Hidradenitis suppurativa: Principal | ICD-10-CM

## 2022-08-09 MED ORDER — HUMIRA PEN CITRATE FREE 40 MG/0.4 ML
SUBCUTANEOUS | 4 refills | 28.00000 days | Status: CP
Start: 2022-08-09 — End: ?
  Filled 2022-08-08: qty 4, 28d supply, fill #4
  Filled 2022-09-05: qty 4, 28d supply, fill #0

## 2022-08-30 ENCOUNTER — Emergency Department
Admit: 2022-08-30 | Discharge: 2022-08-30 | Disposition: A | Discharge status: Home / Self Care | Payer: MEDICAID | Attending: Emergency Medicine

## 2022-08-30 ENCOUNTER — Ambulatory Visit
Admit: 2022-08-30 | Discharge: 2022-08-30 | Disposition: A | Discharge status: Home / Self Care | Payer: MEDICAID | Attending: Emergency Medicine

## 2022-08-30 DIAGNOSIS — M7918 Myalgia, other site: Principal | ICD-10-CM

## 2022-08-30 MED ORDER — BACLOFEN 10 MG TABLET
ORAL_TABLET | Freq: Three times a day (TID) | ORAL | 0 refills | 5 days | Status: CP
Start: 2022-08-30 — End: 2022-09-04

## 2022-08-30 MED ORDER — DICLOFENAC 1 % TOPICAL GEL
Freq: Four times a day (QID) | TOPICAL | 0 refills | 13 days | Status: CP
Start: 2022-08-30 — End: 2023-08-30
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
## 2022-08-31 DIAGNOSIS — Z79631 Methotrexate, long term, current use: Principal | ICD-10-CM

## 2022-08-31 DIAGNOSIS — R110 Nausea: Principal | ICD-10-CM

## 2022-08-31 MED ORDER — PROMETHAZINE 25 MG TABLET
ORAL_TABLET | Freq: Four times a day (QID) | ORAL | 1 refills | 8 days | Status: CP | PRN
Start: 2022-08-31 — End: ?
  Filled 2022-09-05: qty 30, 8d supply, fill #0

## 2022-08-31 NOTE — Unmapped (Signed)
Phenergan refill  Last Visit Date: 07/11/2022  Next Visit Date: 11/20/2022
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
## 2022-08-31 NOTE — Unmapped (Addendum)
[facility] Refill Coordination Note    Specialty Medication(s) to be Shipped:   Inflammatory Disorders: Humira    Other medication(s) to be shipped: Promethazine 25 mg and [employer], DOB: 1981/08/15  Phone: [phone] (home)       All above HIPAA information was verified with patient.     Was a Nurse, learning disability used for this call? No    Completed refill call assessment today to schedule patient's medication shipment from the [facility] [phone]).  All relevant notes have been reviewed.     Specialty medication(s) and dose(s) confirmed: Regimen is correct and unchanged.   Changes to medications: Darely reports no changes at this time.  Changes to insurance: No  New side effects reported not previously addressed with a pharmacist or physician: None reported  Questions for the pharmacist: No    Confirmed patient received a Conservation officer, historic buildings and a Surveyor, mining with first shipment. The patient will receive a drug information handout for each medication shipped and additional FDA Medication Guides as required.       DISEASE/MEDICATION-SPECIFIC INFORMATION        For patients on injectable medications: Patient currently has 0 doses left.  Next injection is scheduled for 09/06/2022.    SPECIALTY MEDICATION ADHERENCE     Medication Adherence    Patient reported X missed doses in the last month: 0  Specialty Medication: Humira  Patient is on additional specialty medications: No  Any gaps in refill history greater than 2 weeks in the last 3 months: no  Demonstrates understanding of importance of adherence: yes  Informant: patient  Reliability of informant: reliable              Confirmed plan for next specialty medication refill: delivery by pharmacy  Refills needed for supportive medications: yes, ordered or provider notified              Were doses missed due to medication being on hold? No    Humira 40/0.4 mg/ml: 0 days of medicine on hand REFERRAL TO PHARMACIST     Referral to the pharmacist: Not needed      [facility]     Shipping address confirmed in Epic.     Delivery Scheduled: Yes, Expected medication delivery date: 09/06/2022.     Medication will be delivered via UPS to the prescription address in Epic WAM.    Valere Dross   [facility] Specialty Technician

## 2022-09-05 MED FILL — EMPTY CONTAINER: 120 days supply | Qty: 1 | Fill #1

## 2022-09-10 ENCOUNTER — Ambulatory Visit: Admit: 2022-09-10 | Discharge: 2022-09-11 | Payer: PRIVATE HEALTH INSURANCE

## 2022-09-11 DIAGNOSIS — F0781 Postconcussional syndrome: Principal | ICD-10-CM

## 2022-09-13 ENCOUNTER — Ambulatory Visit
Admit: 2022-09-13 | Discharge: 2022-09-14 | Disposition: A | Discharge status: Home / Self Care | Payer: PRIVATE HEALTH INSURANCE | Attending: Student in an Organized Health Care Education/Training Program

## 2022-09-13 ENCOUNTER — Emergency Department
Admit: 2022-09-13 | Discharge: 2022-09-14 | Disposition: A | Discharge status: Home / Self Care | Payer: PRIVATE HEALTH INSURANCE | Attending: Student in an Organized Health Care Education/Training Program

## 2022-09-13 LAB — CBC W/ AUTO DIFF
BASOPHILS ABSOLUTE COUNT: 0.1 10*9/L (ref 0.0–0.4)
BASOPHILS RELATIVE PERCENT: 0.8 %
EOSINOPHILS ABSOLUTE COUNT: 0.2 10*9/L (ref 0.0–0.7)
EOSINOPHILS RELATIVE PERCENT: 1.8 %
HEMATOCRIT: 44.5 %
HEMOGLOBIN: 14.3 g/dL
IMMATURE GRANULOCYTES ABSOLUTE COUNT: 0.1 10*9/L (ref 0.0–0.6)
IMMATURE GRANULOCYTES RELATIVE PERCENT: 0.6 %
LYMPHOCYTES ABSOLUTE COUNT: 3.6 10*9/L — ABNORMAL HIGH (ref 1.2–3.4)
LYMPHOCYTES RELATIVE PERCENT: 27.1 %
MEAN CORPUSCULAR HEMOGLOBIN CONC: 32.1 g/dL (ref 31.0–35.5)
MEAN CORPUSCULAR HEMOGLOBIN: 28.9 pg (ref 26.0–34.0)
MEAN CORPUSCULAR VOLUME: 89.9 fL
MEAN PLATELET VOLUME: 9.9 fL (ref 7.0–10.0)
MONOCYTES ABSOLUTE COUNT: 0.6 10*9/L — ABNORMAL HIGH (ref 0.1–0.5)
MONOCYTES RELATIVE PERCENT: 4.7 %
NEUTROPHILS ABSOLUTE COUNT: 8.6 10*9/L — ABNORMAL HIGH (ref 1.4–6.5)
NEUTROPHILS RELATIVE PERCENT: 65 %
PLATELET COUNT: 271 10*9/L (ref 130–400)
RED BLOOD CELL COUNT: 4.95 10*12/L
RED CELL DISTRIBUTION WIDTH: 13.4 % (ref 11.5–15.5)
WBC ADJUSTED: 13.2 10*9/L — ABNORMAL HIGH (ref 4.8–10.8)

## 2022-09-13 LAB — COMPREHENSIVE METABOLIC PANEL
ALBUMIN/GLOBULIN RATIO: 1.7
ALBUMIN: 4.1 g/dL (ref 3.1–4.7)
ALKALINE PHOSPHATASE: 81 U/L (ref 33–120)
ALT (SGPT): 6 U/L
ANION GAP: 9 mmol/L (ref 5–16)
AST (SGOT): 9 U/L — ABNORMAL LOW (ref 10–40)
BILIRUBIN TOTAL: 0.6 mg/dL (ref 0.2–1.2)
BLOOD UREA NITROGEN: 7 mg/dL — ABNORMAL LOW (ref 8–21)
BUN / CREAT RATIO: 12 (ref 6–20)
CALCIUM: 9.1 mg/dL (ref 8.5–10.4)
CHLORIDE: 104 mmol/L (ref 98–110)
CO2: 23 mmol/L — ABNORMAL LOW (ref 24.0–31.0)
CREATININE: 0.6 mg/dL (ref 0.50–1.40)
EGFR CKD-EPI (2021) FEMALE: >90 mL/min/{1.73_m2} (ref >=60)
GLOBULIN, TOTAL: 2.4 g/dL
GLUCOSE RANDOM: 102 mg/dL (ref 75–110)
OSMOLALITY CALCULATED: 270 mosm/kg
POTASSIUM: 3.6 mmol/L (ref 3.5–5.3)
PROTEIN TOTAL: 6.5 g/dL (ref 6.0–8.0)
SODIUM: 136 mmol/L (ref 135–153)

## 2022-09-14 MED ORDER — CLINDAMYCIN HCL 150 MG CAPSULE
ORAL_CAPSULE | Freq: Three times a day (TID) | ORAL | 0 refills | 7 days | Status: CP
Start: 2022-09-14 — End: 2022-09-21

## 2022-09-14 MED ADMIN — clindamycin (CLEOCIN) capsule 450 mg: 450 mg | ORAL | @ 07:00:00 | Stop: 2022-09-14
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
## 2022-09-24 NOTE — Unmapped (Signed)
[facility] Refill Coordination Note    Specialty Medication(s) to be Shipped:   Inflammatory Disorders: Humira    Other medication(s) to be shipped:  promethazine 25mg      Kim Charles, DOB: 09/24/1981  Phone: [phone] (home)       All above HIPAA information was verified with patient.     Was a Nurse, learning disability used for this call? No    Completed refill call assessment today to schedule patient's medication shipment from the [facility] [phone]).  All relevant notes have been reviewed.     Specialty medication(s) and dose(s) confirmed: Regimen is correct and unchanged.   Changes to medications: Mason reports starting the following medications: Oxycodone and clindamycin   Changes to insurance: No  New side effects reported not previously addressed with a pharmacist or physician: None reported  Questions for the pharmacist: No    Confirmed patient received a Conservation officer, historic buildings and a Surveyor, mining with first shipment. The patient will receive a drug information handout for each medication shipped and additional FDA Medication Guides as required.       DISEASE/MEDICATION-SPECIFIC INFORMATION        For patients on injectable medications: Patient currently has 2 doses left.  Next injection is scheduled for 09/27/22.    SPECIALTY MEDICATION ADHERENCE     Medication Adherence    Patient reported X missed doses in the last month: 0  Specialty Medication: Humira  Patient is on additional specialty medications: No  Informant: patient  Reliability of informant: reliable  Provider-estimated medication adherence level: good  Reasons for non-adherence: no problems identified                                Were doses missed due to medication being on hold? No    HUMIRA(CF) PEN 40 mg/0.4 mL injection (adalimumab)  : 7+ days of medicine on hand        REFERRAL TO PHARMACIST     Referral to the pharmacist: Not needed      [facility]     Shipping address confirmed in Epic.     Delivery Scheduled: Yes, Expected medication delivery date: 09/28/22.     Medication will be delivered via UPS to the prescription address in Epic WAM.    Kim Charles' [facility] Specialty Technician

## 2022-09-28 ENCOUNTER — Ambulatory Visit: Admit: 2022-09-28 | Discharge: 2022-09-29 | Payer: PRIVATE HEALTH INSURANCE

## 2022-10-01 MED FILL — PROMETHAZINE 25 MG TABLET: ORAL | 8 days supply | Qty: 30 | Fill #1

## 2022-10-01 MED FILL — HUMIRA PEN CITRATE FREE 40 MG/0.4 ML: SUBCUTANEOUS | 28 days supply | Qty: 4 | Fill #1

## 2022-10-25 DIAGNOSIS — Z79631 Methotrexate, long term, current use: Principal | ICD-10-CM

## 2022-10-25 DIAGNOSIS — R110 Nausea: Principal | ICD-10-CM

## 2022-10-25 MED ORDER — PROMETHAZINE 25 MG TABLET
ORAL_TABLET | Freq: Four times a day (QID) | ORAL | 1 refills | 8 days | PRN
Start: 2022-10-25 — End: ?

## 2022-10-25 NOTE — Unmapped (Signed)
Phenergan refill  Last ov: 07/11/2022  Next ov: 01/02/2023

## 2022-10-25 NOTE — Unmapped (Signed)
[facility] Refill Coordination Note    Specialty Medication(s) to be Shipped:   Inflammatory Disorders: Humira    Other medication(s) to be shipped: Promethazine 25 mg and Hydroxychloroquine 200 mg     Kim Charles, DOB: July 13, 1981  Phone: [phone] (home)       All above HIPAA information was verified with patient.     Was a Nurse, learning disability used for this call? No    Completed refill call assessment today to schedule patient's medication shipment from the [facility] [phone]).  All relevant notes have been reviewed.     Specialty medication(s) and dose(s) confirmed: Regimen is correct and unchanged.   Changes to medications: Kim Charles reports no changes at this time.  Changes to insurance: No  New side effects reported not previously addressed with a pharmacist or physician: None reported  Questions for the pharmacist: No    Confirmed patient received a Conservation officer, historic buildings and a Surveyor, mining with first shipment. The patient will receive a drug information handout for each medication shipped and additional FDA Medication Guides as required.       DISEASE/MEDICATION-SPECIFIC INFORMATION        For patients on injectable medications: Patient currently has 2 doses left.  Next injection is scheduled for 10/26 and 11/2.    SPECIALTY MEDICATION ADHERENCE     Medication Adherence    Patient reported X missed doses in the last month: 0  Specialty Medication: Humira  Patient is on additional specialty medications: No  Any gaps in refill history greater than 2 weeks in the last 3 months: no  Demonstrates understanding of importance of adherence: yes  Informant: patient  Reliability of informant: reliable              Confirmed plan for next specialty medication refill: delivery by pharmacy  Refills needed for supportive medications: not needed              Were doses missed due to medication being on hold? No    Humira 40/0.4 mg/ml: 14 days of medicine on hand         REFERRAL TO PHARMACIST     Referral to the pharmacist: Not needed      [facility]     Shipping address confirmed in Epic.     Delivery Scheduled: Yes, Expected medication delivery date: 10/31.     Medication will be delivered via UPS to the prescription address in Epic WAM.    Valere Dross   [facility] Specialty Technician

## 2022-10-29 MED ORDER — PROMETHAZINE 25 MG TABLET
ORAL_TABLET | Freq: Four times a day (QID) | ORAL | 1 refills | 8 days | Status: CP | PRN
Start: 2022-10-29 — End: ?
  Filled 2022-10-29: qty 30, 8d supply, fill #0

## 2022-10-29 MED FILL — HYDROXYCHLOROQUINE 200 MG TABLET: ORAL | 90 days supply | Qty: 180 | Fill #1

## 2022-10-29 MED FILL — HUMIRA PEN CITRATE FREE 40 MG/0.4 ML: SUBCUTANEOUS | 28 days supply | Qty: 4 | Fill #2

## 2022-11-19 DIAGNOSIS — L732 Hidradenitis suppurativa: Principal | ICD-10-CM

## 2022-11-29 DIAGNOSIS — M359 Systemic involvement of connective tissue, unspecified: Principal | ICD-10-CM

## 2022-11-29 DIAGNOSIS — L732 Hidradenitis suppurativa: Principal | ICD-10-CM

## 2022-11-29 MED ORDER — PREDNISONE 5 MG TABLET
ORAL_TABLET | ORAL | 0 refills | 35.00000 days | Status: CP
Start: 2022-11-29 — End: 2022-11-29
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
## 2022-11-29 NOTE — Unmapped (Signed)
Reason for call: pt is having a Lupus Flare-and has been in bed for 6/ days unable to dress herself, please call pt to discuss. Thanks       Last ov: 07/11/2022  Next ov: 01/02/2023

## 2022-12-01 ENCOUNTER — Ambulatory Visit
Admit: 2022-12-01 | Discharge: 2022-12-01 | Disposition: A | Discharge status: Home / Self Care | Payer: PRIVATE HEALTH INSURANCE | Attending: Student in an Organized Health Care Education/Training Program

## 2022-12-01 DIAGNOSIS — R112 Nausea with vomiting, unspecified: Principal | ICD-10-CM

## 2022-12-01 DIAGNOSIS — E876 Hypokalemia: Principal | ICD-10-CM

## 2022-12-01 DIAGNOSIS — M329 Systemic lupus erythematosus, unspecified: Principal | ICD-10-CM

## 2022-12-01 LAB — URINALYSIS WITH MICROSCOPY
BILIRUBIN UA: NEGATIVE
BLOOD UA: NEGATIVE
GLUCOSE UA: NEGATIVE
KETONES UA: NEGATIVE
LEUKOCYTE ESTERASE UA: NEGATIVE
NITRITE UA: NEGATIVE
PH UA: 6 (ref 5.0–9.0)
RBC UA: 1 /HPF (ref 0–2)
SPECIFIC GRAVITY UA: 1.021 (ref 1.016–1.022)
SQUAMOUS EPITHELIAL: 6 /HPF — ABNORMAL HIGH (ref 0–4)
UROBILINOGEN UA: NORMAL
WBC UA: 4 /HPF (ref 0–4)

## 2022-12-01 LAB — CBC W/ AUTO DIFF
BASOPHILS ABSOLUTE COUNT: 0.2 10*9/L (ref 0.0–0.4)
BASOPHILS RELATIVE PERCENT: 1.2 %
EOSINOPHILS ABSOLUTE COUNT: 0.2 10*9/L (ref 0.0–0.7)
EOSINOPHILS RELATIVE PERCENT: 1.5 %
HEMATOCRIT: 43.5 %
HEMOGLOBIN: 13.8 g/dL
IMMATURE GRANULOCYTES ABSOLUTE COUNT: 0.1 10*9/L (ref 0.0–0.6)
IMMATURE GRANULOCYTES RELATIVE PERCENT: 0.9 %
LYMPHOCYTES ABSOLUTE COUNT: 2.4 10*9/L (ref 1.2–3.4)
LYMPHOCYTES RELATIVE PERCENT: 18.6 %
MEAN CORPUSCULAR HEMOGLOBIN CONC: 31.7 g/dL (ref 31.0–35.5)
MEAN CORPUSCULAR HEMOGLOBIN: 27.1 pg (ref 26.0–34.0)
MEAN CORPUSCULAR VOLUME: 85.5 fL
MEAN PLATELET VOLUME: 11.2 fL — ABNORMAL HIGH (ref 7.0–10.0)
MONOCYTES ABSOLUTE COUNT: 1.1 10*9/L — ABNORMAL HIGH (ref 0.1–0.5)
MONOCYTES RELATIVE PERCENT: 8.5 %
NEUTROPHILS ABSOLUTE COUNT: 9 10*9/L — ABNORMAL HIGH (ref 1.4–6.5)
NEUTROPHILS RELATIVE PERCENT: 69.3 %
PLATELET COUNT: 306 10*9/L (ref 130–400)
RED BLOOD CELL COUNT: 5.09 10*12/L
RED CELL DISTRIBUTION WIDTH: 12.2 % (ref 11.5–15.5)
WBC ADJUSTED: 12.9 10*9/L — ABNORMAL HIGH (ref 4.8–10.8)

## 2022-12-01 LAB — COMPREHENSIVE METABOLIC PANEL
ALBUMIN/GLOBULIN RATIO: 1.2
ALBUMIN: 3.5 g/dL (ref 3.1–4.7)
ALKALINE PHOSPHATASE: 77 U/L (ref 33–120)
ALT (SGPT): 6 U/L
ANION GAP: 10 mmol/L (ref 5–16)
AST (SGOT): 10 U/L (ref 10–40)
BILIRUBIN TOTAL: 0.5 mg/dL (ref 0.2–1.2)
BLOOD UREA NITROGEN: 12 mg/dL (ref 8–21)
BUN / CREAT RATIO: 20 (ref 6–20)
CALCIUM: 8.8 mg/dL (ref 8.5–10.4)
CHLORIDE: 97 mmol/L — ABNORMAL LOW (ref 98–110)
CO2: 28 mmol/L (ref 24.0–31.0)
CREATININE: 0.6 mg/dL (ref 0.50–1.40)
EGFR CKD-EPI (2021) FEMALE: >90 mL/min/{1.73_m2} (ref >=60)
GLOBULIN, TOTAL: 2.9 g/dL
GLUCOSE RANDOM: 95 mg/dL (ref 75–110)
OSMOLALITY CALCULATED: 270 mosm/kg
POTASSIUM: 2.9 mmol/L — ABNORMAL LOW (ref 3.5–5.3)
PROTEIN TOTAL: 6.4 g/dL (ref 6.0–8.0)
SODIUM: 135 mmol/L (ref 135–153)

## 2022-12-01 LAB — LIPASE: LIPASE: 9 U/L — ABNORMAL LOW (ref 11–82)

## 2022-12-01 MED ADMIN — sodium chloride 0.9% (NS) bolus 1,000 mL: 1000 mL | INTRAVENOUS | @ 10:00:00 | Stop: 2022-12-01

## 2022-12-01 MED ADMIN — potassium chloride (MICRO-K) capsule 40 mEq: 40 meq | ORAL | @ 12:00:00 | Stop: 2022-12-01

## 2022-12-01 MED ADMIN — ondansetron (ZOFRAN) injection 4 mg: 4 mg | INTRAVENOUS | @ 10:00:00 | Stop: 2022-12-01

## 2022-12-01 MED ADMIN — acetaminophen (TYLENOL) tablet 650 mg: 650 mg | ORAL | @ 10:00:00 | Stop: 2022-12-01

## 2022-12-01 MED ADMIN — potassium chloride 10 mEq in 100 mL IVPB: 10 meq | INTRAVENOUS | @ 11:00:00 | Stop: 2022-12-01

## 2022-12-01 MED ADMIN — famotidine (PEPCID) IVPB 20 mg: 20 mg | INTRAVENOUS | @ 10:00:00 | Stop: 2022-12-01

## 2022-12-01 MED ADMIN — dexAMETHasone (DECADRON) 4 mg/mL injection 10 mg: 10 mg | INTRAVENOUS | @ 10:00:00 | Stop: 2022-12-01

## 2022-12-01 NOTE — Unmapped (Signed)
Bed: 26  Expected date:   Expected time:   Means of arrival:   Comments:  Ems lupus

## 2022-12-01 NOTE — Unmapped (Signed)
Pt resting comfortably in ED stretcher with eyes closed. Rise and fall of chest noted. No acute distress. Pt still  on continuous monitoring. Call light given, bed locked , Awaiting for further evaluation. Iv infusing well. Urine sample labelled and sent to lab.

## 2022-12-01 NOTE — Unmapped (Signed)
Pt to ED via EMS with c/o pain . Pt reports worsening pain in the upper and lower joints for 3 days. Pt reports was seen in the urgent care was given steroids . Pt reports it might be flare up of her Lupus dse. Pt states takes Plaquenil 20 mg everyday. Respirations even/unlabored. Denies chest pain and [place] to monitor.

## 2022-12-01 NOTE — Unmapped (Signed)
Provider at bedside. Pt complaining irritation in the iv site after potassium infusion. Iv removed per pt request.

## 2022-12-01 NOTE — Unmapped (Signed)
EMERGENCY MEDICINE    Chief Complaint   Patient presents with    Pain       HISTORY OF PRESENT ILLNESS:    History provided by:  Patient and EMS personnel  Language interpreter used: No      Kim Charles is a 41 y.o. female with hx of HTN, ADHD, leukemia, and Lupus who presents to the ED via EMS for evaluation of lupus flare up per patient. Patient notes she was feeling bad and went to urgent with results of negative Flu and COVID. Patient notes she is taking steroids, but is unable to take them every day. No other complaints at this time.     REVIEW OF SYSTEMS:  Review of Systems   Constitutional:  Negative for chills and fever.   HENT:  Negative for congestion, ear pain, rhinorrhea and sore throat.    Eyes:  Negative for visual disturbance.   Respiratory:  Negative for cough and shortness of breath.    Cardiovascular:  Negative for chest pain.   Gastrointestinal:  Positive for abdominal pain. Negative for diarrhea, nausea and vomiting.   Genitourinary:  Negative for dysuria.   Musculoskeletal:  Negative for myalgias.   Skin:  Negative for rash.   Neurological:  Negative for headaches.         PAST HISTORY:   Past Medical History:   Diagnosis Date    ADHD (attention deficit hyperactivity disorder)     Allergic 2007    Allergic to shrimp    Anemia     Asthma     Chronic pain     Disorder of skin or subcutaneous tissue 2001    Hidradenitis suprative    Fibromyalgia     Hidradenitis suppurativa     Hypertension     Leukemia (CMS-HCC)     Lung disease 2017    COPD    Lupus (systemic lupus erythematosus) (CMS-HCC)     Migraines     Osteoarthritis     Scoliosis      Past Surgical History:   Procedure Laterality Date    CESAREAN SECTION      LAPAROSCOPIC ENDOMETRIOSIS FULGURATION      PARTIAL HYSTERECTOMY       Family History   Problem Relation Age of Onset    Thyroid disease Mother     Diabetes Mother     Lupus Father         My fathers nephew and sister    Diabetes Father     Cancer Sister     Diabetes Brother     Cancer Maternal Uncle     Osteoarthritis Paternal Aunt     Stroke Paternal Uncle     Cancer Maternal Grandmother     Basal cell carcinoma Neg Hx     Melanoma Neg Hx     Squamous cell carcinoma Neg Hx     Glaucoma Neg Hx     Macular degeneration Neg Hx      Social History     Tobacco Use    Smoking status: Every Day     Packs/day: 1.00     Years: 15.00     Additional pack years: 0.00     Total pack years: 15.00     Types: Cigarettes    Smokeless tobacco: Never   Substance Use Topics    Alcohol use: No     Patient's Medications   New Prescriptions    ONDANSETRON (ZOFRAN-ODT) 4 MG DISINTEGRATING TABLET  Take 1 tablet (4 mg total) by mouth every eight (8) hours as needed for nausea for up to 7 doses.   Previous Medications    ALBUTEROL (PROVENTIL HFA;VENTOLIN HFA) 90 MCG/ACTUATION INHALER    Inhale 2 puffs every six (6) hours as needed.    AMLODIPINE (NORVASC) 10 MG TABLET    Take 1 tablet (10 mg total) by mouth daily.    ASPIRIN-ACETAMINOPHEN-CAFFEINE (EXCEDRIN MIGRAINE) 250-250-65 MG PER TABLET    Take 2 tablets by mouth.    BECLOMETHASONE (QVAR) 40 MCG/ACTUATION INHALER    Inhale 2 puffs daily as needed.    BENZONATATE (TESSALON) 200 MG CAPSULE    Take 1 capsule (200 mg total) by mouth Three (3) times a day as needed for cough.    CLONIDINE HCL (CATAPRES) 0.1 MG TABLET    Take 1 tablet (0.1 mg total) by mouth Two (2) times a day.    CYCLOBENZAPRINE (FLEXERIL) 10 MG TABLET    Take 1 tablet (10 mg total) by mouth two (2) times a day as needed for muscle spasms.    DEXLANSOPRAZOLE (DEXILANT) 60 MG CAPSULE    Take 1 capsule (60 mg total) by mouth daily.    DICLOFENAC SODIUM (VOLTAREN) 1 % GEL    Apply 2 g topically four (4) times a day.    EMPTY CONTAINER (SHARPS CONTAINER) MISC    Use as directed    EMPTY CONTAINER MISC    Use as directed    EMPTY CONTAINER MISC    Use as directed    EPINEPHRINE (EPIPEN) 0.3 MG/0.3 ML INJECTION    Inject 0.3 mL (0.3 mg total) into the muscle.    ESCITALOPRAM OXALATE (LEXAPRO) 10 MG TABLET Take 1 tablet (10 mg total) by mouth daily.    FLUTICASONE PROPIONATE (FLOVENT HFA) 220 MCG/ACTUATION INHALER    2 puffs.    GABAPENTIN (NEURONTIN) 600 MG TABLET    Take 1 tablet (600 mg total) by mouth Three (3) times a day.    HUMIRA PEN CITRATE FREE 40 MG/0.4 ML    Inject the contents of 1 pen (40 mg total) under the skin every seven (7) days.    HYDROCHLOROTHIAZIDE (HYDRODIURIL) 50 MG TABLET    Take 1 tablet (50 mg total) by mouth daily.    HYDROCODONE-ACETAMINOPHEN (NORCO 10-325) 10-325 MG PER TABLET    Take 1 tablet by mouth 5 times daily as needed for pain    HYDROXYCHLOROQUINE (PLAQUENIL) 200 MG TABLET    Take 2 tablets (400 mg total) by mouth daily.    HYSINGLA ER 20 MG TP24    Take 1 tablet by mouth every 24 hours    IPRATROPIUM (ATROVENT) 0.03 % NASAL SPRAY    2 sprays into each nostril.    LISINOPRIL (PRINIVIL,ZESTRIL) 20 MG TABLET    Take 1 tablet (20 mg total) by mouth daily.    METFORMIN (GLUCOPHAGE) 500 MG TABLET    TAKE 1 TABLET BY MOUTH 2 TIMES DAILY WITH MEALS    MITIGARE 0.6 MG CAP CAPSULE    Take 1 capsule (0.6 mg total) by mouth daily.    MONTELUKAST (SINGULAIR) 10 MG TABLET    Take 1 tablet (10 mg total) by mouth nightly.    NARCAN 4 MG/ACTUATION NASAL SPRAY    spray (4 mg) in 1 nostril may repeat dose every 2-3 minutes as needed alternating nostrils with each dose    PREDNISONE (DELTASONE) 5 MG TABLET    Take 4 tablets (20 mg total) by  mouth daily for 7 days, THEN 3 tablets (15 mg total) daily for 7 days, THEN 2 tablets (10 mg total) daily for 7 days, THEN 1 tablet (5 mg total) daily for 7 days, THEN 0.5 tablets (2.5 mg total) daily for 7 days.    PROMETHAZINE (PHENERGAN) 25 MG TABLET    Take 1 tablet (25 mg total) by mouth every six (6) hours as needed for nausea.    PROPRANOLOL (INDERAL) 40 MG TABLET    Take 1 tablet (40 mg total) by mouth Two (2) times a day.    RIFAMPIN (RIFADIN) 300 MG CAPSULE    Take 1 capsule (300 mg total) by mouth two (2) times a day.    SPIRONOLACTONE (ALDACTONE) 25 MG TABLET    Take 1 tablet (25 mg total) by mouth daily.    SYRINGE WITH NEEDLE (BD TUBERCULIN SYRINGE) 1 ML 25 GAUGE X 5/8 SYRG    Use 1 syringe once a week for methotrexate injections.    TOPIRAMATE (TOPAMAX) 50 MG TABLET    TAKE 1 TABLET BY MOUTH 2 TIMES DAILY    TRAZODONE (DESYREL) 50 MG TABLET    Take 1 tablet (50 mg total) by mouth.    VYVANSE 30 MG CAPSULE    Take 1 capsule (30 mg total) by mouth two (2) times a day.   Modified Medications    No medications on file   Discontinued Medications    No medications on file     Allergies   Allergen Reactions    Other Hives, Itching and Other (See Comments)     High fever. Hives and itching all over the body.    Shellfish Containing Products Anaphylaxis    Sulfasalazine Hives    Toradol [Ketorolac] Hives    Venom-Honey Bee Anaphylaxis    Opioids - Morphine Analogues      Other reaction(s): Other  Other reaction(s): Other (See Comments)  Causes migraine  Headache    Tramadol Nausea And Vomiting    Hydromorphone Hcl      migraine    Naproxen      Grits teeth         PHYSICAL EXAM:  Vitals:    12/01/22 0545   BP: 151/103   Pulse: 95   Resp: 18   Temp:    SpO2: 93%     Physical Exam  Vitals and nursing note reviewed.   Constitutional:       General: She is awake. She is not in acute distress.     Appearance: Normal appearance. She is well-developed and well-groomed. She is obese.   HENT:      Head: Normocephalic and atraumatic.      Right Ear: External ear normal.      Left Ear: External ear normal.      Nose: Nose normal.      Mouth/Throat:      Mouth: Mucous membranes are moist.   Eyes:      General: Lids are normal. Vision grossly intact.      Extraocular Movements: Extraocular movements intact.      Conjunctiva/sclera: Conjunctivae normal.   Cardiovascular:      Rate and Rhythm: Normal rate and regular rhythm.      Pulses: Normal pulses.      Heart sounds: Normal heart sounds. No murmur heard.     No friction rub. No gallop.   Pulmonary:      Effort: Pulmonary effort is normal. No respiratory distress.  Breath sounds: Normal breath sounds.   Abdominal:      General: Bowel sounds are normal.      Palpations: Abdomen is soft.      Tenderness: There is no right CVA tenderness, left CVA tenderness, guarding or rebound.   Musculoskeletal:         General: Normal range of motion.      Cervical back: Normal range of motion.   Skin:     General: Skin is warm.      Capillary Refill: Capillary refill takes less than 2 seconds.   Neurological:      General: No focal deficit present.      Mental Status: She is alert and oriented to person, place, and time.      Cranial Nerves: Cranial nerves 2-12 are intact.      Sensory: Sensation is intact.   Psychiatric:         Attention and Perception: Attention normal.         Mood and Affect: Mood normal.         Behavior: Behavior normal. Behavior is cooperative.         Thought Content: Thought content normal.         Judgment: Judgment normal.           DIAGNOSTICS:   Pulse Ox: Pulse oximetry 93% on RA indicating adequate oxygenation.    Imaging:  No orders to display     Labs:  Labs Reviewed   COMPREHENSIVE METABOLIC PANEL - Abnormal; Notable for the following components:       Result Value    Potassium 2.9 (*)     Chloride 97 (*)     All other components within normal limits   LIPASE - Abnormal; Notable for the following components:    Lipase 9 (*)     All other components within normal limits   URINALYSIS WITH MICROSCOPY - Abnormal; Notable for the following components:    Protein, UA 1+ (*)     Squam Epithel, UA 6 (*)     All other components within normal limits   CBC W/ AUTO DIFF - Abnormal; Notable for the following components:    WBC 12.9 (*)     MPV 11.2 (*)     Absolute Neutrophils 9.0 (*)     Absolute Monocytes 1.1 (*)     All other components within normal limits   CBC W/ DIFFERENTIAL    Narrative:     The following orders were created for panel order CBC w/ Differential.  Procedure                               Abnormality Status                     ---------                               -----------         ------                     CBC w/ Differential[[id number]]         Abnormal            Final result                 Please view results for these tests  on the individual orders.         ED COURSE AND TREATMENT:   Patient???s condition remained stable during Emergency Department evaluation.     External Medical Records Reviewed: Patient was last evaluated at [facility] for neck pain on 11/30/2022.     Orders written.    Diagnostics reviewed.     Patient was administered the following  Medications   potassium chloride (MICRO-K) capsule 40 mEq (40 mEq Oral Given 12/01/22 0644)   dexAMETHasone (DECADRON) 4 mg/mL injection 10 mg (10 mg Intravenous Given 12/01/22 0457)   acetaminophen (TYLENOL) tablet 650 mg (650 mg Oral Given 12/01/22 0458)   ondansetron (ZOFRAN) injection 4 mg (4 mg Intravenous Given 12/01/22 0459)   sodium chloride 0.9% (NS) bolus 1,000 mL (0 mL Intravenous Stopped 12/01/22 0650)   famotidine (PEPCID) IVPB 20 mg (0 mg Intravenous Stopped 12/01/22 0513)   potassium chloride 10 mEq in 100 mL IVPB (0 mEq Intravenous Stopped 12/01/22 0650)       BP 151/103  - Pulse 95  - Temp 36.8 ??C (98.3 ??F) (Oral)  - Resp 18  - Ht 154.9 cm (5' 1)  - Wt 73 kg (161 lb)  - SpO2 93%  - BMI 30.42 kg/m??         No results found for this or any previous visit (from the past 4464 hour(s)).         CLINICAL IMPRESSION:  1. Lupus (CMS-HCC)    2. Nausea and vomiting, unspecified vomiting type    3. Hypokalemia        CONDITION ON DISPOSITION:   Stable    PLAN AND FOLLOW-UP:   Patient instructed to follow up with PCP in 2-3 days. Strict return precautions discussed. Patient instructed to return to the ED for persistent or worsening symptoms. Patient expresses understanding and is agreeable with plan.       PRESCRIPTIONS:      Your Medication List        START taking these medications      ondansetron 4 MG disintegrating tablet  Commonly known as: ZOFRAN-ODT  Take 1 tablet (4 mg total) by mouth every eight (8) hours as needed for nausea for up to 7 doses.            ASK your doctor about these medications      albuterol 90 mcg/actuation inhaler  Commonly known as: PROVENTIL HFA;VENTOLIN HFA  Inhale 2 puffs every six (6) hours as needed.     amlodipine 10 MG tablet  Commonly known as: NORVASC  Take 1 tablet (10 mg total) by mouth daily.     aspirin-acetaminophen-caffeine 250-250-65 mg per tablet  Commonly known as: EXCEDRIN MIGRAINE  Take 2 tablets by mouth.     BD TUBERCULIN SYRINGE 1 mL 25 gauge x 5/8 Syrg  Generic drug: syringe with needle  Use 1 syringe once a week for methotrexate injections.     beclomethasone 40 mcg/actuation inhaler  Commonly known as: QVAR  Inhale 2 puffs daily as needed.     benzonatate 200 MG capsule  Commonly known as: TESSALON  Take 1 capsule (200 mg total) by mouth Three (3) times a day as needed for cough.     cloNIDine HCL 0.1 MG tablet  Commonly known as: CATAPRES  Take 1 tablet (0.1 mg total) by mouth Two (2) times a day.     cyclobenzaprine 10 MG tablet  Commonly known as: FLEXERIL  Take 1 tablet (10 mg total) by mouth  two (2) times a day as needed for muscle spasms.     dexlansoprazole 60 mg capsule  Commonly known as: DEXILANT  Take 1 capsule (60 mg total) by mouth daily.     diclofenac sodium 1 % gel  Commonly known as: VOLTAREN  Apply 2 g topically four (4) times a day.     empty container Misc  Commonly known as: sharps container  Use as directed     empty container Misc  Use as directed     empty container Misc  Use as directed     EPINEPHrine 0.3 mg/0.3 mL injection  Commonly known as: EPIPEN  Inject 0.3 mL (0.3 mg total) into the muscle.     escitalopram oxalate 10 MG tablet  Commonly known as: LEXAPRO  Take 1 tablet (10 mg total) by mouth daily.     FLOVENT HFA 220 mcg/actuation inhaler  Generic drug: fluticasone propionate  2 puffs.     gabapentin 600 MG tablet  Commonly known as: NEURONTIN  Take 1 tablet (600 mg total) by mouth Three (3) times a day.     HUMIRA(CF) PEN 40 mg/0.4 mL injection  Generic drug: adalimumab  Inject the contents of 1 pen (40 mg total) under the skin every seven (7) days.     hydroCHLOROthiazide 50 MG tablet  Commonly known as: HYDRODIURIL  Take 1 tablet (50 mg total) by mouth daily.     HYDROcodone-acetaminophen 10-325 mg per tablet  Commonly known as: NORCO 10-325  Take 1 tablet by mouth 5 times daily as needed for pain     hydroxychloroquine 200 mg tablet  Commonly known as: PLAQUENIL  Take 2 tablets (400 mg total) by mouth daily.     HYSINGLA ER 20 mg Tp24  Generic drug: HYDROcodone bitartrate  Take 1 tablet by mouth every 24 hours     ipratropium 21 mcg (0.03 %) nasal spray  Commonly known as: ATROVENT  2 sprays into each nostril.     lisinopriL 20 MG tablet  Commonly known as: PRINIVIL,ZESTRIL  Take 1 tablet (20 mg total) by mouth daily.     metFORMIN 500 MG tablet  Commonly known as: GLUCOPHAGE  TAKE 1 TABLET BY MOUTH 2 TIMES DAILY WITH MEALS     MITIGARE 0.6 mg Cap capsule  Generic drug: colchicine  Take 1 capsule (0.6 mg total) by mouth daily.     montelukast 10 mg tablet  Commonly known as: SINGULAIR  Take 1 tablet (10 mg total) by mouth nightly.     NARCAN 4 mg/actuation nasal spray  Generic drug: naloxone  spray (4 mg) in 1 nostril may repeat dose every 2-3 minutes as needed alternating nostrils with each dose     predniSONE 5 MG tablet  Commonly known as: DELTASONE  Take 4 tablets (20 mg total) by mouth daily for 7 days, THEN 3 tablets (15 mg total) daily for 7 days, THEN 2 tablets (10 mg total) daily for 7 days, THEN 1 tablet (5 mg total) daily for 7 days, THEN 0.5 tablets (2.5 mg total) daily for 7 days.  Start taking on: November 29, 2022     promethazine 25 MG tablet  Commonly known as: PHENERGAN  Take 1 tablet (25 mg total) by mouth every six (6) hours as needed for nausea.     propranoloL 40 MG tablet  Commonly known as: INDERAL  Take 1 tablet (40 mg total) by mouth Two (2) times a day.     rifAMPin 300  MG capsule  Commonly known as: RIFADIN  Take 1 capsule (300 mg total) by mouth two (2) times a day.     spironolactone 25 MG tablet  Commonly known as: ALDACTONE  Take 1 tablet (25 mg total) by mouth daily.     topiramate 50 MG tablet  Commonly known as: TOPAMAX  TAKE 1 TABLET BY MOUTH 2 TIMES DAILY     traZODone 50 MG tablet  Commonly known as: DESYREL  Take 1 tablet (50 mg total) by mouth.     VYVANSE 30 MG Cap capsule  Generic drug: lisdexamfetamine  Take 1 capsule (30 mg total) by mouth two (2) times a day.                MEDICAL DECISION MAKING::  Medical Decision Making  41 year old female presented for pain secondary to a lupus flare.  Patient states she has a steroid prescription at home which she has not been able to take due to nausea and vomiting.  CBC was unremarkable.  Low suspicion for symptomatic anemia, acute life-threatening hemorrhage.  CMP was unremarkable aside from hypokalemia of 2.9.  Potassium repleted.  Low suspicion for other electrolyte abnormalities, AKI, ARF, cholecystitis, choledocholithiasis, ascending cholangitis.  UA was unremarkable.  Low suspicion for UTI.  Patient had a partial hysterectomy.  Does not have a uterus.  Low suspicion for IUP, ectopic pregnancy.  Lipase was WNL.  Low suspicion for pancreatitis.  Patient abdomen was soft, nontender.  No guarding, rigidity, peritoneal signs.  Low suspicion for bowel obstruction, appendicitis, bowel perforation.  Patient was given a shot of Decadron, Pepcid, Zofran, potassium repletion, Tylenol.  Reported significant improvement in generalized myalgias as well as abdominal pain.  Patient was given a Zofran prescription for home in order to be able to take her steroids for her lupus flare.  Instructed to follow-up with PCP and rheumatology.  Given strict return precautions.  Voiced understanding.  Remained hemodynamically stable throughout ED course.  Discharged home.    Amount and/or Complexity of Data Reviewed  Independent Historian: EMS  External Data Reviewed: labs, ECG and notes.  Labs: ordered. Decision-making details documented in ED Course.  ECG/medicine tests: ordered and independent interpretation performed. Decision-making details documented in ED Course.        Portions of this note were dictated using Animal nutritionist.  It has been reviewed for accuracy, but may contain grammatical and clerical errors.    I, Louis Meckel scribed for [employer], DO on 12/01/22.    This documentation accurately reflects the service I, Westly Pam, DO, performed and the decisions I made.     The patient was seen, evaluated and managed by ED resident, Westly Pam, DO, with shared decision making and direct supervision by ED attending Izola Price, DO     Westly Pam, DO  Resident  12/01/22 [phone]

## 2022-12-03 ENCOUNTER — Emergency Department
Admit: 2022-12-03 | Discharge: 2022-12-03 | Disposition: A | Discharge status: Home / Self Care | Payer: PRIVATE HEALTH INSURANCE | Attending: Emergency Medicine

## 2022-12-03 ENCOUNTER — Ambulatory Visit
Admit: 2022-12-03 | Discharge: 2022-12-03 | Disposition: A | Discharge status: Home / Self Care | Payer: PRIVATE HEALTH INSURANCE | Attending: Emergency Medicine

## 2022-12-03 DIAGNOSIS — R1013 Epigastric pain: Principal | ICD-10-CM

## 2022-12-03 LAB — COMPREHENSIVE METABOLIC PANEL
ALBUMIN/GLOBULIN RATIO: 1.3
ALBUMIN: 3.4 g/dL (ref 3.1–4.7)
ALKALINE PHOSPHATASE: 76 U/L (ref 33–120)
ALT (SGPT): 9 U/L
ANION GAP: 8 mmol/L (ref 5–16)
AST (SGOT): 8 U/L — ABNORMAL LOW (ref 10–40)
BILIRUBIN TOTAL: 0.5 mg/dL (ref 0.2–1.2)
BLOOD UREA NITROGEN: 9 mg/dL (ref 8–21)
BUN / CREAT RATIO: 18 (ref 6–20)
CALCIUM: 8.5 mg/dL (ref 8.5–10.4)
CHLORIDE: 100 mmol/L (ref 98–110)
CO2: 28 mmol/L (ref 24.0–31.0)
CREATININE: 0.5 mg/dL (ref 0.50–1.40)
EGFR CKD-EPI (2021) FEMALE: >90 mL/min/{1.73_m2} (ref >=60)
GLOBULIN, TOTAL: 2.7 g/dL
GLUCOSE RANDOM: 101 mg/dL (ref 75–110)
OSMOLALITY CALCULATED: 271 mosm/kg
POTASSIUM: 3.7 mmol/L (ref 3.5–5.3)
PROTEIN TOTAL: 6.1 g/dL (ref 6.0–8.0)
SODIUM: 136 mmol/L (ref 135–153)

## 2022-12-03 LAB — URINALYSIS WITH MICROSCOPY
BILIRUBIN UA: NEGATIVE
BLOOD UA: NEGATIVE
GLUCOSE UA: NEGATIVE
KETONES UA: NEGATIVE
LEUKOCYTE ESTERASE UA: NEGATIVE
NITRITE UA: NEGATIVE
PH UA: 6 (ref 5.0–9.0)
RBC UA: 3 /HPF — ABNORMAL HIGH (ref 0–2)
SPECIFIC GRAVITY UA: >1.03 — ABNORMAL HIGH (ref 1.016–1.022)
SQUAMOUS EPITHELIAL: 42 /HPF — ABNORMAL HIGH (ref 0–4)
UROBILINOGEN UA: NORMAL
WBC UA: 1 /HPF (ref 0–4)

## 2022-12-03 LAB — CBC W/ AUTO DIFF
BASOPHILS ABSOLUTE COUNT: 0.1 10*9/L (ref 0.0–0.4)
BASOPHILS RELATIVE PERCENT: 0.6 %
EOSINOPHILS ABSOLUTE COUNT: 0.1 10*9/L (ref 0.0–0.7)
EOSINOPHILS RELATIVE PERCENT: 0.4 %
HEMATOCRIT: 43.8 %
HEMOGLOBIN: 13.7 g/dL
IMMATURE GRANULOCYTES ABSOLUTE COUNT: 0.1 10*9/L (ref 0.0–0.6)
IMMATURE GRANULOCYTES RELATIVE PERCENT: 0.8 %
LYMPHOCYTES ABSOLUTE COUNT: 2.7 10*9/L (ref 1.2–3.4)
LYMPHOCYTES RELATIVE PERCENT: 15.5 %
MEAN CORPUSCULAR HEMOGLOBIN CONC: 31.3 g/dL (ref 31.0–35.5)
MEAN CORPUSCULAR HEMOGLOBIN: 27.1 pg (ref 26.0–34.0)
MEAN CORPUSCULAR VOLUME: 86.6 fL
MEAN PLATELET VOLUME: 10.8 fL — ABNORMAL HIGH (ref 7.0–10.0)
MONOCYTES ABSOLUTE COUNT: 1.3 10*9/L — ABNORMAL HIGH (ref 0.1–0.5)
MONOCYTES RELATIVE PERCENT: 7.8 %
NEUTROPHILS ABSOLUTE COUNT: 12.8 10*9/L — ABNORMAL HIGH (ref 1.4–6.5)
NEUTROPHILS RELATIVE PERCENT: 74.9 %
PLATELET COUNT: 333 10*9/L (ref 130–400)
RED BLOOD CELL COUNT: 5.06 10*12/L
RED CELL DISTRIBUTION WIDTH: 12.3 % (ref 11.5–15.5)
WBC ADJUSTED: 17.1 10*9/L — ABNORMAL HIGH (ref 4.8–10.8)

## 2022-12-03 LAB — LIPASE: LIPASE: 7 U/L — ABNORMAL LOW (ref 11–82)

## 2022-12-03 LAB — LACTATE SEPSIS: LACTATE: 1.4 mmol/L (ref 0.5–2.0)

## 2022-12-03 MED ADMIN — ondansetron (ZOFRAN) injection 4 mg: 4 mg | INTRAVENOUS | @ 14:00:00 | Stop: 2022-12-03

## 2022-12-03 MED ADMIN — fentaNYL (PF) (SUBLIMAZE) injection 50 mcg: 50 ug | INTRAVENOUS | @ 14:00:00 | Stop: 2022-12-03

## 2022-12-03 MED ADMIN — iopamidoL (ISOVUE-300) 300 mg iodine /mL (61 %) solution 100 mL: 100 mL | INTRAVENOUS | @ 16:00:00 | Stop: 2022-12-03

## 2022-12-03 MED ADMIN — fentaNYL (PF) (SUBLIMAZE) injection 50 mcg: 50 ug | INTRAVENOUS | @ 17:00:00 | Stop: 2022-12-03

## 2022-12-03 MED ADMIN — ondansetron (ZOFRAN) 4 mg/2 mL injection: INTRAVENOUS | @ 14:00:00 | Stop: 2022-12-03

## 2022-12-03 MED ADMIN — fentaNYL (PF) (SUBLIMAZE) 50 mcg/mL injection: INTRAVENOUS | @ 14:00:00 | Stop: 2022-12-03

## 2022-12-03 NOTE — Unmapped (Signed)
Bed: 12  Expected date:   Expected time:   Means of arrival:   Comments:  Ems

## 2022-12-03 NOTE — Unmapped (Addendum)
Pt coming in VIA ems DUE TO LUQ PAIN SINCE 0400 THIS AM. Pt reports having nausea but no vomiting. Pt tearful at this time,.

## 2022-12-03 NOTE — Unmapped (Signed)
Provider at bedside

## 2022-12-03 NOTE — Unmapped (Incomplete)
Pt resting at this time.

## 2022-12-03 NOTE — Unmapped (Signed)
Chief Complaint   Patient presents with    Abdominal Pain     Pt coming in VIA ems DUE TO LUQ PAIN SINCE 0400 THIS AM. Pt reports having nausea but no vomiting. Pt tearful at this time,.       HISTORY OF PRESENT ILLNESS:  Kim Charles is a 41 y.o. female with presents to emergency department with a chief complaint of of left upper quadrant abdominal pain and nausea since 4 AM this morning.  She also endorses 1 episode of runny black diarrhea.  She states she has taken Pepto-Bismol with no relief.  Denies chest pain, shortness of breath, denies fever, endorses chills.  Denies any vaginal bleeding.  States she could not be pregnant because she has a partial hysterectomy          REVIEW OF SYSTEMS:  Review of Systems   Constitutional:  Positive for chills.   Gastrointestinal:  Positive for abdominal pain, diarrhea and nausea.   Genitourinary:  Negative for vaginal bleeding and vaginal discharge.   All other systems reviewed and are negative.        PAST HISTORY:   Past Medical History:   Past Medical History:   Diagnosis Date    ADHD (attention deficit hyperactivity disorder)     Allergic 2007    Allergic to shrimp    Anemia     Asthma     Chronic pain     Disorder of skin or subcutaneous tissue 2001    Hidradenitis suprative    Fibromyalgia     Hidradenitis suppurativa     Hypertension     Leukemia (CMS-HCC)     Lung disease 2017    COPD    Lupus (systemic lupus erythematosus) (CMS-HCC)     Migraines     Osteoarthritis     Scoliosis         Past Surgical History:   Procedure Laterality Date    CESAREAN SECTION      LAPAROSCOPIC ENDOMETRIOSIS FULGURATION      PARTIAL HYSTERECTOMY       Past Family History:   Family History   Problem Relation Age of Onset    Thyroid disease Mother     Diabetes Mother     Lupus Father         My fathers nephew and sister    Diabetes Father     Cancer Sister     Diabetes Brother     Cancer Maternal Uncle     Osteoarthritis Paternal Aunt     Stroke Paternal Uncle     Cancer Maternal Grandmother     Basal cell carcinoma Neg Hx     Melanoma Neg Hx     Squamous cell carcinoma Neg Hx     Glaucoma Neg Hx     Macular degeneration Neg Hx      Past Social History:   Social History     Tobacco Use    Smoking status: Every Day     Current packs/day: 1.00     Average packs/day: 1 pack/day for 15.0 years (15.0 ttl pk-yrs)     Types: Cigarettes    Smokeless tobacco: Never   Substance Use Topics    Alcohol use: No     Medications:   Prior to Admission medications    Medication Sig Start Date End Date Taking? Authorizing Provider   albuterol (PROVENTIL HFA;VENTOLIN HFA) 90 mcg/actuation inhaler Inhale 2 puffs every six (6) hours as needed. 05/17/22   Chestine Spore,  Cathleen Corti, FNP   amLODIPine (NORVASC) 10 MG tablet Take 1 tablet (10 mg total) by mouth daily. 07/26/22 07/26/23  Artist Beach Hammonds, FNP   aspirin-acetaminophen-caffeine (EXCEDRIN MIGRAINE) 250-250-65 mg per tablet Take 2 tablets by mouth.    [provider]   beclomethasone (QVAR) 40 mcg/actuation inhaler Inhale 2 puffs daily as needed. 03/09/15   [provider]   benzonatate (TESSALON) 200 MG capsule Take 1 capsule (200 mg total) by mouth Three (3) times a day as needed for cough. 09/28/21   Chari Manning, DNP   cloNIDine HCL (CATAPRES) 0.1 MG tablet Take 1 tablet (0.1 mg total) by mouth Two (2) times a day. 07/26/22   Artist Beach Hammonds, FNP   cyclobenzaprine (FLEXERIL) 10 MG tablet Take 1 tablet (10 mg total) by mouth two (2) times a day as needed for muscle spasms. 04/22/22   Salvadore Farber, DO   dexlansoprazole [facility]) 60 mg capsule Take 1 capsule (60 mg total) by mouth daily. 07/26/22   Artist Beach Hammonds, FNP   diclofenac sodium (VOLTAREN) 1 % gel Apply 2 g topically four (4) times a day. 08/30/22 08/30/23  Gretel Acre, PA   empty container (SHARPS [place]) Misc Use as directed 09/29/20   Liz Malady, MD   empty container Misc Use as directed 11/08/21   Ardith Dark, MD empty container Misc Use as directed 03/12/22   Ardith Dark, MD   EPINEPHrine [facility]) 0.3 mg/0.3 mL injection Inject 0.3 mL (0.3 mg total) into the muscle. 07/08/15   [provider]   escitalopram oxalate (LEXAPRO) 10 MG tablet Take 1 tablet (10 mg total) by mouth daily. 04/29/18   [provider]   fluticasone propionate (FLOVENT HFA) 220 mcg/actuation inhaler 2 puffs. 11/05/17   [provider]   gabapentin (NEURONTIN) 600 MG tablet Take 1 tablet (600 mg total) by mouth Three (3) times a day. 07/26/22 07/26/23  Artist Beach Hammonds, FNP   HUMIRA PEN CITRATE FREE 40 MG/0.4 ML Inject the contents of 1 pen (40 mg total) under the skin every seven (7) days. 08/09/22   Elsie Stain, MD   hydroCHLOROthiazide (HYDRODIURIL) 50 MG tablet Take 1 tablet (50 mg total) by mouth daily. 07/26/22 07/26/23  Artist Beach Hammonds, FNP   HYDROcodone-acetaminophen (NORCO 10-325) 10-325 mg per tablet Take 1 tablet by mouth 5 times daily as needed for pain 10/20/19   [provider]   hydroxychloroquine (PLAQUENIL) 200 mg tablet Take 2 tablets (400 mg total) by mouth daily. 06/25/22   Ethlyn Gallery, MD   [facility] ER 20 mg TP24 Take 1 tablet by mouth every 24 hours 10/01/19   [provider]   ipratropium (ATROVENT) 0.03 % nasal spray 2 sprays into each nostril.    [provider]   lisinopriL (PRINIVIL,ZESTRIL) 20 MG tablet Take 1 tablet (20 mg total) by mouth daily. 07/26/22 02/21/23  Artist Beach Hammonds, FNP   metFORMIN (GLUCOPHAGE) 500 MG tablet TAKE 1 TABLET BY MOUTH 2 TIMES DAILY WITH MEALS 01/19/20   Kirke Shaggy, MD   MITIGARE 0.6 mg cap capsule Take 1 capsule (0.6 mg total) by mouth daily. 12/22/18   [provider]   montelukast (SINGULAIR) 10 mg tablet Take 1 tablet (10 mg total) by mouth nightly. 09/28/21   Chari Manning, DNP   NARCAN 4 mg/actuation nasal spray spray (4 mg) in 1 nostril may repeat dose every 2-3 minutes as needed alternating nostrils with  each dose 08/19/19   [provider]   ondansetron (ZOFRAN-ODT) 4 MG disintegrating tablet Take 1 tablet (4 mg total) by mouth every eight (8) hours as needed for nausea for up to 7 doses. 12/01/22   Westly Pam, DO   predniSONE (DELTASONE) 5 MG tablet Take 4 tablets (20 mg total) by mouth daily for 7 days, THEN 3 tablets (15 mg total) daily for 7 days, THEN 2 tablets (10 mg total) daily for 7 days, THEN 1 tablet (5 mg total) daily for 7 days, THEN 0.5 tablets (2.5 mg total) daily for 7 days. 11/29/22 01/03/23  Ishizawar, Jaci Lazier, MD   promethazine (PHENERGAN) 25 MG tablet Take 1 tablet (25 mg total) by mouth every six (6) hours as needed for nausea. 10/29/22   Ishizawar, Jaci Lazier, MD   propranoloL (INDERAL) 40 MG tablet Take 1 tablet (40 mg total) by mouth Two (2) times a day. 07/26/22   Artist Beach Hammonds, FNP   rifAMPin (RIFADIN) 300 MG capsule Take 1 capsule (300 mg total) by mouth two (2) times a day. 10/10/21   Ardith Dark, MD   spironolactone (ALDACTONE) 25 MG tablet Take 1 tablet (25 mg total) by mouth daily. 07/26/22   Artist Beach Hammonds, FNP   syringe with needle (BD TUBERCULIN SYRINGE) 1 mL 25 gauge x 5/8 Syrg Use 1 syringe once a week for methotrexate injections. 03/01/21   Ishizawar, Jaci Lazier, MD   topiramate (TOPAMAX) 50 MG tablet TAKE 1 TABLET BY MOUTH 2 TIMES DAILY 04/29/18   [provider]   traZODone (DESYREL) 50 MG tablet Take 1 tablet (50 mg total) by mouth. 09/26/17   [provider]   VYVANSE 30 mg capsule Take 1 capsule (30 mg total) by mouth two (2) times a day. 04/24/18   [provider]     Allergies:   Allergies   Allergen Reactions    Other Hives, Itching and Other (See Comments)     High fever. Hives and itching all over the body.    Shellfish Containing Products Anaphylaxis    Sulfasalazine Hives    Toradol [Ketorolac] Hives    Venom-Honey Bee Anaphylaxis    Opioids - Morphine Analogues Other reaction(s): Other  Other reaction(s): Other (See Comments)  Causes migraine  Headache    Tramadol Nausea And Vomiting    Hydromorphone Hcl      migraine    Naproxen      Grits teeth       Past medical, family, and social histories personally reviewed and verified by me.     PHYSICAL EXAM:  Physical Exam  Constitutional:       Appearance: She is well-developed.   HENT:      Head: Normocephalic and atraumatic.      Mouth/Throat:      Mouth: Mucous membranes are moist.      Pharynx: Oropharynx is clear.   Cardiovascular:      Rate and Rhythm: Normal rate and regular rhythm.      Heart sounds: Normal heart sounds.   Pulmonary:      Effort: Pulmonary effort is normal.      Breath sounds: Normal breath sounds.   Abdominal:      General: Bowel sounds are increased.      Palpations: Abdomen is soft.      Tenderness: There is generalized abdominal tenderness and tenderness in the epigastric area and left upper quadrant. There is left CVA tenderness. There is no right  CVA tenderness.      Hernia: No hernia is present.   Skin:     General: Skin is warm and dry.      Capillary Refill: Capillary refill takes less than 2 seconds.   Neurological:      Mental Status: She is alert and oriented to person, place, and time.         DIAGNOSTICS:   Imaging:  CT Abdomen Pelvis W IV Contrast   Final Result   1. No acute findings of the abdomen or pelvis.   2. Indeterminate right adrenal nodule. Recommend nonemergent adrenal protocol CT or MRI for further characterization.   Electronically signed by:  Daymon Larsen DO  12/03/2022 11:12 AM EST Workstation: [id number]        Labs:  Labs Reviewed   COMPREHENSIVE METABOLIC PANEL - Abnormal; Notable for the following components:       Result Value    AST 8 (*)     All other components within normal limits   URINALYSIS WITH MICROSCOPY - Abnormal; Notable for the following components:    Specific Gravity, UA >1.030 (*)     Protein, UA Trace (*)     RBC, UA 3 (*)     Squam Epithel, UA 42 (*) All other components within normal limits   LIPASE - Abnormal; Notable for the following components:    Lipase 7 (*)     All other components within normal limits   CBC W/ AUTO DIFF - Abnormal; Notable for the following components:    WBC 17.1 (*)     MPV 10.8 (*)     Absolute Neutrophils 12.8 (*)     Absolute Monocytes 1.3 (*)     All other components within normal limits   LACTATE SEPSIS - Normal   CBC W/ DIFFERENTIAL    Narrative:     The following orders were created for panel order CBC w/ Differential.  Procedure                               Abnormality         Status                     ---------                               -----------         ------                     CBC w/ Differential[[id number]]         Abnormal            Final result                 Please view results for these tests on the individual orders.       No results found for this or any previous visit (from the past 4464 hour(s)).    ED COURSE AND TREATMENT:   Patient???s condition remained stable during Emergency Department evaluation.     Old records requested and reviewed.    Medications   ondansetron (ZOFRAN) injection 4 mg (4 mg Intravenous Given 12/03/22 0857)   fentaNYL (PF) (SUBLIMAZE) injection 50 mcg (50 mcg Intravenous Given 12/03/22 0857)   iopamidoL (ISOVUE-300) 300 mg iodine /mL (61 %) solution 100 mL (100 mL Intravenous Given  12/03/22 1038)   fentaNYL (PF) (SUBLIMAZE) injection 50 mcg (50 mcg Intravenous Given 12/03/22 1138)       ED Triage Vitals [12/03/22 0847]   Enc Vitals Group      BP 164/116      Heart Rate 88      SpO2 Pulse       Resp 18      Temp 36.9 ??C (98.5 ??F)      Temp Source Oral      SpO2 98 %      Weight 73 kg (161 lb)      Height 1.549 m (5' 1)      Head Circumference       Peak Flow       Pain Score       Pain Loc       Pain Edu?       Excl. in GC?        ED Course as of 12/03/22 1308   Mon Dec 03, 2022   8670 41 year old female presents to emergency department with a chief complaint of of left upper quadrant abdominal pain and nausea since 4 AM this morning.  She also endorses 1 episode of runny black diarrhea.  She states she has taken Pepto-Bismol with no relief.  Denies chest pain, shortness of breath, denies fever, endorses chills.  Denies any vaginal bleeding.  States she could not be pregnant because she has a partial hysterectomy   0933 CBC w/ Differential(!):    WBC 17.1(!)   RBC 5.06   HGB 13.7   HCT 43.8   MCV 86.6   MCH 27.1   MCHC 31.3   RDW 12.3   MPV 10.8(!)   Platelet 333   Neutrophils % 74.9   Immature Granulocyte % 0.8   Lymphocytes % 15.5   Monocytes % 7.8   Eosinophils % 0.4   Basophils % 0.6   Absolute Neutrophils 12.8(!)   Immature Granulocyte Count 0.1   Absolute Lymphocytes 2.7   Absolute Monocytes  1.3(!)   Absolute Eosinophils 0.1   Absolute Basophils  0.1  Leukocytosis with neutrophilia noted   1000 Guaiac negative, will proceed with CT abdomen pelvis with IV contrast   1009  guaiac negative   1057 Patient resting comfortably, no apparent distress vital signs are stable   1119 CT Abdomen Pelvis W IV Contrast  IMPRESSION:  1. No acute findings of the abdomen or pelvis.  2. Indeterminate right adrenal nodule. Recommend nonemergent adrenal protocol CT or MRI for further characterization.     1147 Given history and physical examination, will treat patient's pain and discharge with close follow-up, patient verbalized understanding and also patient made aware of incidental finding on CT scan.  Patient understood the need for follow-up for her adrenal glands.   1305 Urinalysis with Microscopy(!):    Color, UA Yellow   Clarity, UA Clear   Spec Grav, UA >1.030(!)   pH, UA 6.0   Leukocyte Esterase, UA Negative   Nitrite, UA Negative   Protein, UA Trace(!)   Glucose, UA Negative   Ketones, UA Negative   Urobilinogen, UA Normal   Bilirubin, UA Negative   Blood, UA Negative   RBC, UA 3(!)   WBC, UA 1   Squam Epithel, UA 42(!)  Urine out that shows elevated specific gravity, trace protein, no signs of infection       Procedures        BP 173/113  - Pulse 95  -  Temp 36.9 ??C (98.5 ??F) (Oral)  - Resp 18  - Ht 154.9 cm (5' 1)  - Wt 73 kg (161 lb)  - SpO2 93%  - BMI 30.42 kg/m??     CLINICAL IMPRESSION:  Final diagnoses:   Epigastric pain (Primary)       CONDITION ON DISPOSITION:   Stable    PLAN AND FOLLOW-UP:     Patient was educated on diagnosis. Treatment options and importance of follow up as directed discussed. Follow up with PCP. Patient expressed all questions were answered.  Strict return precautions were given to patient who verbalized understanding.  Patient agrees with plan and verbalized understanding.    MEDICAL DECISION MAKING::    Medical Decision Making  See workup tab for medical decision making process              Electrically signed by:     Tobey Bride, DO  Emergency Medicine, PGY-1  [facility]   [address] [place], [place]  12/03/2022 1:08 PM               The patient was seen, evaluated and managed by Tobey Bride, PGY-1 with shared decision making and direct supervision by Mingo Amber, MD .     ================================================================================================================    This chart was partially dictated by Dragon, a voice recognition software. It may contain errors and words that were not intended to be used. Please contact the physicians for errors or clarifications.            Tobey Bride, MD  Resident  12/03/22 [phone]

## 2022-12-05 NOTE — Unmapped (Unsigned)
The [facility] has made a second and final attempt to reach this patient to refill the following medication:Humira.      We have left voicemails on the following phone numbers: [phone], have sent a text message to the following phone numbers: [phone] and have sent a Mychart questionnaire..    Dates contacted: 11/30 and 12/6  Last scheduled delivery: 10/31    The patient may be at risk of non-compliance with this medication. The patient should call the [facility] at [phone]  Option 4, then Option 2 (all other specialty patients) to refill medication.    Kim Charles   [facility] Specialty Technician

## 2022-12-11 NOTE — Unmapped (Signed)
Reason for call:     pt is in a lot of pain, been to ER they said it's nothing they can do for her -its her lupus, her daughter is having to wash and dress her, I offered her an appt tomorrow with Aubrey,tomorrow- but she has a court date - Please advise, thanks   Last ov: 07/11/2022  Next ov: 01/02/2023

## 2022-12-12 ENCOUNTER — Ambulatory Visit
Admit: 2022-12-12 | Discharge: 2022-12-13 | Disposition: A | Discharge status: Home / Self Care | Payer: PRIVATE HEALTH INSURANCE

## 2022-12-12 DIAGNOSIS — R1012 Left upper quadrant pain: Principal | ICD-10-CM

## 2022-12-12 LAB — COMPREHENSIVE METABOLIC PANEL
ALBUMIN/GLOBULIN RATIO: 1.1
ALBUMIN: 3.2 g/dL (ref 3.1–4.7)
ALKALINE PHOSPHATASE: 99 U/L (ref 33–120)
ALT (SGPT): 7 U/L
ANION GAP: 7 mmol/L (ref 5–16)
AST (SGOT): 8 U/L — ABNORMAL LOW (ref 10–40)
BILIRUBIN TOTAL: 0.7 mg/dL (ref 0.2–1.2)
BLOOD UREA NITROGEN: 9 mg/dL (ref 8–21)
BUN / CREAT RATIO: 15 (ref 6–20)
CALCIUM: 8.3 mg/dL — ABNORMAL LOW (ref 8.5–10.4)
CHLORIDE: 96 mmol/L — ABNORMAL LOW (ref 98–110)
CO2: 31 mmol/L (ref 24.0–31.0)
CREATININE: 0.6 mg/dL (ref 0.50–1.40)
EGFR CKD-EPI (2021) FEMALE: >90 mL/min/{1.73_m2} (ref >=60)
GLOBULIN, TOTAL: 2.8 g/dL
GLUCOSE RANDOM: 91 mg/dL (ref 75–110)
OSMOLALITY CALCULATED: 267 mosm/kg
POTASSIUM: 3.8 mmol/L (ref 3.5–5.3)
PROTEIN TOTAL: 6 g/dL (ref 6.0–8.0)
SODIUM: 134 mmol/L — ABNORMAL LOW (ref 135–153)

## 2022-12-12 LAB — URINALYSIS WITH MICROSCOPY
BILIRUBIN UA: NEGATIVE
BLOOD UA: NEGATIVE
GLUCOSE UA: NEGATIVE
KETONES UA: NEGATIVE
LEUKOCYTE ESTERASE UA: NEGATIVE
NITRITE UA: NEGATIVE
PH UA: 7 (ref 5.0–9.0)
RBC UA: 2 /HPF (ref 0–2)
SPECIFIC GRAVITY UA: 1.026 — ABNORMAL HIGH (ref 1.016–1.022)
SQUAMOUS EPITHELIAL: 10 /HPF — ABNORMAL HIGH (ref 0–4)
WBC UA: 6 /HPF — ABNORMAL HIGH (ref 0–4)

## 2022-12-12 LAB — CBC W/ AUTO DIFF
BASOPHILS ABSOLUTE COUNT: 0.1 10*9/L (ref 0.0–0.4)
BASOPHILS RELATIVE PERCENT: 0.9 %
EOSINOPHILS ABSOLUTE COUNT: 0.2 10*9/L (ref 0.0–0.7)
EOSINOPHILS RELATIVE PERCENT: 1 %
HEMATOCRIT: 44.3 %
HEMOGLOBIN: 14.2 g/dL
IMMATURE GRANULOCYTES ABSOLUTE COUNT: 0.2 10*9/L (ref 0.0–0.6)
IMMATURE GRANULOCYTES RELATIVE PERCENT: 1.2 %
LYMPHOCYTES ABSOLUTE COUNT: 3.8 10*9/L — ABNORMAL HIGH (ref 1.2–3.4)
LYMPHOCYTES RELATIVE PERCENT: 24.5 %
MEAN CORPUSCULAR HEMOGLOBIN CONC: 32.1 g/dL (ref 31.0–35.5)
MEAN CORPUSCULAR HEMOGLOBIN: 26.9 pg (ref 26.0–34.0)
MEAN CORPUSCULAR VOLUME: 83.9 fL
MEAN PLATELET VOLUME: 10.5 fL — ABNORMAL HIGH (ref 7.0–10.0)
MONOCYTES ABSOLUTE COUNT: 1.1 10*9/L — ABNORMAL HIGH (ref 0.1–0.5)
MONOCYTES RELATIVE PERCENT: 7.1 %
NEUTROPHILS ABSOLUTE COUNT: 10.2 10*9/L — ABNORMAL HIGH (ref 1.4–6.5)
NEUTROPHILS RELATIVE PERCENT: 65.3 %
PLATELET COUNT: 377 10*9/L (ref 130–400)
RED BLOOD CELL COUNT: 5.28 10*12/L
RED CELL DISTRIBUTION WIDTH: 12.7 % (ref 11.5–15.5)
WBC ADJUSTED: 15.5 10*9/L — ABNORMAL HIGH (ref 4.8–10.8)

## 2022-12-12 LAB — HIGH SENSITIVITY TROPONIN I - SERIAL: HIGH SENSITIVITY TROPONIN I: <3 ng/L (ref <=37)

## 2022-12-12 LAB — LACTATE: LACTATE: 1.1 mmol/L (ref 0.5–2.2)

## 2022-12-12 LAB — HIGH SENSITIVITY TROPONIN I - 2 HOUR SERIAL
HIGH SENSITIVITY TROPONIN - DELTA (0-2H): 0 ng/L — ABNORMAL LOW (ref 5–15)
HIGH-SENSITIVITY TROPONIN I - 2 HOUR: <3 ng/L (ref <=37)

## 2022-12-12 LAB — LIPASE: LIPASE: 12 U/L (ref 11–82)

## 2022-12-12 MED ADMIN — famotidine (PEPCID) IVPB 20 mg: 20 mg | INTRAVENOUS | @ 21:00:00 | Stop: 2022-12-12

## 2022-12-12 MED ADMIN — aluminum-magnesium hydroxide-simethicone (MAALOX MAX) 80-80-8 mg/mL oral suspension: 10 mL | ORAL | @ 21:00:00 | Stop: 2022-12-12

## 2022-12-12 MED ADMIN — sodium chloride 0.9% (NS) bolus 1,000 mL: 1000 mL | INTRAVENOUS | @ 22:00:00 | Stop: 2022-12-12

## 2022-12-12 MED ADMIN — sucralfate (CARAFATE) tablet 1 g: 1 g | ORAL | @ 22:00:00 | Stop: 2022-12-12

## 2022-12-12 MED ADMIN — morphine 4 mg/mL injection 4 mg: 4 mg | INTRAVENOUS | @ 23:00:00 | Stop: 2022-12-12

## 2022-12-12 NOTE — Unmapped (Signed)
C/o left lower/upper abdomen pain x 2 weeks, pt states she keeps coming to ED for same symptoms an can't find out what's wrong, c/o nausea an vomiting an fever highest 99. Denies chest pain or sob. States had MRI done recently and didn't show anything

## 2022-12-12 NOTE — Unmapped (Signed)
Called patient as received message she was in pain. Recently seen at ED last week. 12/2 potassium low. 12/4 potassium corrected. Abdominal imaging wnl. She is noting nerve pain. Advised her that we would need to see her in person. Recently prescribed prednisone taper which she is still taking. She canceled appointment in November 2023 with Carlus Pavlov [facility]. She was offered earlier today appointment today but declined stating conflict with court visit. Offered her visit on Thursday Dec 14 at 10 am. She agrees. Will ask administrative staff to schedule. She appreciated phone call.

## 2022-12-13 ENCOUNTER — Ambulatory Visit: Admit: 2022-12-13 | Payer: PRIVATE HEALTH INSURANCE

## 2022-12-13 NOTE — Unmapped (Cosign Needed)
EMERGENCY MEDICINE        CHIEF COMPLAINT:  Chief Complaint   Patient presents with    Abdominal Pain     States LLQ and LUQ Onset x 2 weeks         41 year old female presents emergency department with complaints of left upper quad abdominal pain, epigastric pain times several weeks.  Patient reports she has been to the emergency department several times for this complaint and had workups and no one is finding out what is wrong with her.  Patient also reports she has not followed up with a GI doctor.  Patient states her PCP did put her on some acid medication that she has been taking for the past 6 days with no improvement.  Patient states when she takes pain medication she does get some improvement but it does not last long.  Patient also does report having some nausea with pain episodes.  Patient denies any shortness of breath, chest pain, vomiting, urinary symptoms, vaginal discharge.      Abdominal Pain  Associated symptoms: nausea    Associated symptoms: no vomiting                  PAST MEDICAL HISTORY:  Past Medical History:   Diagnosis Date    ADHD (attention deficit hyperactivity disorder)     Allergic 2007    Allergic to shrimp    Anemia     Asthma     Chronic pain     Disorder of skin or subcutaneous tissue 2001    Hidradenitis suprative    Fibromyalgia     Hidradenitis suppurativa     Hypertension     Leukemia (CMS-HCC)     Lung disease 2017    COPD    Lupus (systemic lupus erythematosus) (CMS-HCC)     Migraines     Osteoarthritis     Scoliosis        PAST SURGICAL HISTORY:  Past Surgical History:   Procedure Laterality Date    CESAREAN SECTION      LAPAROSCOPIC ENDOMETRIOSIS FULGURATION      PARTIAL HYSTERECTOMY         MEDICATIONS:  Prior to Admission medications    Medication Sig Start Date End Date Taking? Authorizing Provider   albuterol (PROVENTIL HFA;VENTOLIN HFA) 90 mcg/actuation inhaler Inhale 2 puffs every six (6) hours as needed. 05/17/22   Wilfred Curtis, FNP   amLODIPine (NORVASC) 10 MG tablet Take 1 tablet (10 mg total) by mouth daily. 07/26/22 07/26/23  Artist Beach Hammonds, FNP   aspirin-acetaminophen-caffeine (EXCEDRIN MIGRAINE) 250-250-65 mg per tablet Take 2 tablets by mouth.    [provider]   beclomethasone (QVAR) 40 mcg/actuation inhaler Inhale 2 puffs daily as needed. 03/09/15   [provider]   benzonatate (TESSALON) 200 MG capsule Take 1 capsule (200 mg total) by mouth Three (3) times a day as needed for cough. 09/28/21   Chari Manning, DNP   cloNIDine HCL (CATAPRES) 0.1 MG tablet Take 1 tablet (0.1 mg total) by mouth Two (2) times a day. 07/26/22   Artist Beach Hammonds, FNP   cyclobenzaprine (FLEXERIL) 10 MG tablet Take 1 tablet (10 mg total) by mouth two (2) times a day as needed for muscle spasms. 04/22/22   Salvadore Farber, DO   dexlansoprazole [facility]) 60 mg capsule Take 1 capsule (60 mg total) by mouth daily. 12/06/22   Artist Beach Hammonds, FNP   diclofenac sodium (VOLTAREN) 1 % gel Apply  2 g topically four (4) times a day. 08/30/22 08/30/23  Gretel Acre, PA   empty container (SHARPS [place]) Misc Use as directed 09/29/20   Liz Malady, MD   empty container Misc Use as directed 11/08/21   Ardith Dark, MD   empty container Misc Use as directed 03/12/22   Ardith Dark, MD   EPINEPHrine [facility]) 0.3 mg/0.3 mL injection Inject 0.3 mL (0.3 mg total) into the muscle. 07/08/15   [provider]   escitalopram oxalate (LEXAPRO) 10 MG tablet Take 1 tablet (10 mg total) by mouth daily. 04/29/18   [provider]   fluticasone propionate (FLOVENT HFA) 220 mcg/actuation inhaler 2 puffs. 11/05/17   [provider]   gabapentin (NEURONTIN) 600 MG tablet Take 1 tablet (600 mg total) by mouth Three (3) times a day. 07/26/22 07/26/23  Artist Beach Hammonds, FNP   HUMIRA PEN CITRATE FREE 40 MG/0.4 ML Inject the contents of 1 pen (40 mg total) under the skin every seven (7) days. 08/09/22   Elsie Stain, MD   hydroCHLOROthiazide (HYDRODIURIL) 50 MG tablet Take 1 tablet (50 mg total) by mouth daily. 07/26/22 07/26/23  Artist Beach Hammonds, FNP   HYDROcodone-acetaminophen (NORCO 10-325) 10-325 mg per tablet Take 1 tablet by mouth 5 times daily as needed for pain 10/20/19   [provider]   hydroxychloroquine (PLAQUENIL) 200 mg tablet Take 2 tablets (400 mg total) by mouth daily. 06/25/22   Ethlyn Gallery, MD   [facility] ER 20 mg TP24 Take 1 tablet by mouth every 24 hours 10/01/19   [provider]   ipratropium (ATROVENT) 0.03 % nasal spray 2 sprays into each nostril.    [provider]   lisinopriL (PRINIVIL,ZESTRIL) 20 MG tablet Take 1 tablet (20 mg total) by mouth daily. 07/26/22 02/21/23  Artist Beach Hammonds, FNP   metFORMIN (GLUCOPHAGE) 500 MG tablet TAKE 1 TABLET BY MOUTH 2 TIMES DAILY WITH MEALS 01/19/20   Kirke Shaggy, MD   MITIGARE 0.6 mg cap capsule Take 1 capsule (0.6 mg total) by mouth daily. 12/22/18   [provider]   montelukast (SINGULAIR) 10 mg tablet Take 1 tablet (10 mg total) by mouth nightly. 09/28/21   Chari Manning, DNP   NARCAN 4 mg/actuation nasal spray spray (4 mg) in 1 nostril may repeat dose every 2-3 minutes as needed alternating nostrils with each dose 08/19/19   [provider]   ondansetron (ZOFRAN-ODT) 4 MG disintegrating tablet Take 1 tablet (4 mg total) by mouth every eight (8) hours as needed for nausea for up to 7 doses. 12/01/22   Westly Pam, DO   predniSONE (DELTASONE) 5 MG tablet Take 4 tablets (20 mg total) by mouth daily for 7 days, THEN 3 tablets (15 mg total) daily for 7 days, THEN 2 tablets (10 mg total) daily for 7 days, THEN 1 tablet (5 mg total) daily for 7 days, THEN 0.5 tablets (2.5 mg total) daily for 7 days. 11/29/22 01/03/23  Ishizawar, Jaci Lazier, MD   promethazine (PHENERGAN) 25 MG tablet Take 1 tablet (25 mg total) by mouth every six (6) hours as needed for nausea. 10/29/22 Ishizawar, Jaci Lazier, MD   propranoloL (INDERAL) 40 MG tablet Take 1 tablet (40 mg total) by mouth Two (2) times a day. 07/26/22   Artist Beach Hammonds, FNP   rifAMPin (RIFADIN) 300 MG capsule Take 1 capsule (300 mg total) by mouth two (2) times a day. 10/10/21  Ardith Dark, MD   spironolactone (ALDACTONE) 25 MG tablet Take 1 tablet (25 mg total) by mouth daily. 07/26/22   Artist Beach Hammonds, FNP   syringe with needle (BD TUBERCULIN SYRINGE) 1 mL 25 gauge x 5/8 Syrg Use 1 syringe once a week for methotrexate injections. 03/01/21   Ishizawar, Jaci Lazier, MD   topiramate (TOPAMAX) 50 MG tablet TAKE 1 TABLET BY MOUTH 2 TIMES DAILY 04/29/18   [provider]   traZODone (DESYREL) 50 MG tablet Take 1 tablet (50 mg total) by mouth. 09/26/17   [provider]   VYVANSE 30 mg capsule Take 1 capsule (30 mg total) by mouth two (2) times a day. 04/24/18   [provider]       ALLERGIES:  Allergies   Allergen Reactions    Other Hives, Itching and Other (See Comments)     High fever. Hives and itching all over the body.    Shellfish Containing Products Anaphylaxis    Sulfasalazine Hives    Toradol [Ketorolac] Hives    Venom-Honey Bee Anaphylaxis    Opioids - Morphine Analogues      Other reaction(s): Other  Other reaction(s): Other (See Comments)  Causes migraine  Headache    Tramadol Nausea And Vomiting    Hydromorphone Hcl      migraine    Naproxen      Grits teeth       FAMILY HISTORY:  Family History   Problem Relation Age of Onset    Thyroid disease Mother     Diabetes Mother     Lupus Father         My fathers nephew and sister    Diabetes Father     Cancer Sister     Diabetes Brother     Cancer Maternal Uncle     Osteoarthritis Paternal Aunt     Stroke Paternal Uncle     Cancer Maternal Grandmother     Basal cell carcinoma Neg Hx     Melanoma Neg Hx     Squamous cell carcinoma Neg Hx     Glaucoma Neg Hx     Macular degeneration Neg Hx        SOCIAL HISTORY:  Social History Tobacco Use    Smoking status: Every Day     Current packs/day: 1.00     Average packs/day: 1 pack/day for 15.0 years (15.0 ttl pk-yrs)     Types: Cigarettes    Smokeless tobacco: Never   Substance Use Topics    Alcohol use: No       REVIEW OF SYMPTOMS:  Review of Systems   Constitutional: Negative.    HENT: Negative.     Respiratory: Negative.     Cardiovascular: Negative.    Gastrointestinal:  Positive for abdominal pain and nausea. Negative for vomiting.   Genitourinary: Negative.    Skin: Negative.    Neurological: Negative.    Psychiatric/Behavioral: Negative.       All other systems reviewed and are negative        PHYSICAL EXAM:  Vitals:    12/12/22 1423 12/12/22 1424 12/12/22 1852 12/12/22 1934   BP:  166/128 141/93 145/89   Pulse: 103  107 92   Resp: 18  18 18    Temp: 37 ??C (98.6 ??F)   37 ??C (98.6 ??F)   TempSrc: Oral   Oral   SpO2: 99%  95% 96%   Weight: 72.6 kg (160 lb)  Height: 154.9 cm (5' 1)          Physical Exam  Vitals and nursing note reviewed.   Constitutional:       Appearance: Normal appearance. She is normal weight.   HENT:      Head: Normocephalic.      Right Ear: External ear normal.      Left Ear: External ear normal.   Eyes:      Extraocular Movements: Extraocular movements intact.      Pupils: Pupils are equal, round, and reactive to light.   Cardiovascular:      Rate and Rhythm: Normal rate and regular rhythm.   Pulmonary:      Effort: Pulmonary effort is normal. No respiratory distress.      Breath sounds: Normal breath sounds. No wheezing or rhonchi.   Abdominal:      General: Abdomen is flat. Bowel sounds are normal. There is no distension.      Palpations: Abdomen is soft.      Tenderness: There is abdominal tenderness in the epigastric area and left upper quadrant. There is no right CVA tenderness or left CVA tenderness. Negative signs include Murphy's sign.   Musculoskeletal:         General: Normal range of motion.      Cervical back: Normal range of motion and neck supple. Skin:     General: Skin is warm and dry.   Neurological:      General: No focal deficit present.      Mental Status: She is alert and oriented to person, place, and time. Mental status is at baseline.   Psychiatric:         Mood and Affect: Mood normal.         Behavior: Behavior normal.         Thought Content: Thought content normal.         Judgment: Judgment normal.                DIAGNOSTICS:   Imaging:  No orders to display       Labs:  Labs Reviewed   URINALYSIS WITH MICROSCOPY - Abnormal; Notable for the following components:       Result Value    Clarity, UA Hazy (*)     Specific Gravity, UA 1.026 (*)     Protein, UA 1+ (*)     Urobilinogen, UA 2+ (*)     WBC, UA 6 (*)     Squam Epithel, UA 10 (*)     Bacteria, UA Few (*)     Mucus, UA Moderate (*)     All other components within normal limits   COMPREHENSIVE METABOLIC PANEL - Abnormal; Notable for the following components:    Sodium 134 (*)     Chloride 96 (*)     Calcium 8.3 (*)     AST 8 (*)     All other components within normal limits   HIGH SENSITIVITY TROPONIN I - 2 HOUR SERIAL - Abnormal; Notable for the following components:    delta hsTroponin I 0 (*)     All other components within normal limits   CBC W/ AUTO DIFF - Abnormal; Notable for the following components:    WBC 15.5 (*)     MPV 10.5 (*)     Absolute Neutrophils 10.2 (*)     Absolute Lymphocytes 3.8 (*)     Absolute Monocytes 1.1 (*)     All other components  within normal limits   LIPASE - Normal   LACTATE - Normal   HIGH SENSITIVITY TROPONIN I - SERIAL - Normal   CBC W/ DIFFERENTIAL    Narrative:     The following orders were created for panel order CBC w/ Differential.  Procedure                               Abnormality         Status                     ---------                               -----------         ------                     CBC w/ Differential[[id number]]         Abnormal            Final result                 Please view results for these tests on the individual orders.   PREGNANCY, URINE       Procedures:  Procedures      ED COURSE AND MEDICAL DECISION MAKING:       Old records reviewed.    Orders Placed This Encounter   Procedures    Urinalysis with Microscopy    Comprehensive Metabolic Panel    CBC w/ Differential    Lipase    Urine, Pregnancy Qualitatve    Lactic Acid    hsTroponin I (serial 0-2-6H w/ delta)    hsTroponin I - 2 Hour    hsTroponin I - 6 Hour    ECG 12 Lead    Saline lock IV       Medications   aluminum-magnesium hydroxide-simethicone (MAALOX MAX) 80-80-8 mg/mL oral suspension (10 mL Oral Given 12/12/22 1621)   famotidine (PEPCID) IVPB 20 mg (0 mg Intravenous Stopped 12/12/22 1805)   sodium chloride 0.9% (NS) bolus 1,000 mL (0 mL Intravenous Stopped 12/12/22 1851)   sucralfate (CARAFATE) tablet 1 g (1 g Oral Given 12/12/22 1704)   morphine 4 mg/mL injection 4 mg (4 mg Intravenous Given 12/12/22 1807)       ED Course as of 12/12/22 1952   Wed Dec 12, 2022   1757 Patient reports she is still continue to have pain, recommend given her morphine IV.  Patient states she has had it in the past and her allergy to it is a headache.  But patient states she is currently on morphine patches that were provided to her by her PCP.  Patient states she would like to have it at this time.             EKG  Encounter Date: 12/12/22   ECG 12 Lead   Result Value    EKG Systolic BP     EKG Diastolic BP     EKG Ventricular Rate 99    EKG Atrial Rate 99    EKG P-R Interval 130    EKG QRS Duration 108    EKG Q-T Interval 366    EKG QTC Calculation 469    EKG Calculated P Axis 55    EKG Calculated R Axis 32    EKG Calculated T Axis  27    QTC Fredericia 432    Narrative    Normal sinus rhythm  Incomplete right bundle branch block  Borderline ECG  No previous ECGs available         FINAL IMPRESSION:  Final diagnoses:   Left upper quadrant abdominal pain (Primary)         PLAN:       Rx:   Medications Prescribed Today               sucralfate (CARAFATE) 1 gram tablet Take 1 tablet (1 g total) by mouth four (4) times a day.    famotidine (PEPCID) 20 MG tablet Take 1 tablet (20 mg total) by mouth two (2) times a day for 15 days.              Medical Decision Making:  Medical Decision Making  41 year old female presents emergency department with complaints of left upper quad abdominal pain, epigastric pain times several weeks.  Patient reports she has been to the emergency department several times for this complaint and had workups and no one is finding out what is wrong with her.  Patient also reports she has not followed up with a GI doctor.  Patient states her PCP did put her on some acid medication that she has been taking for the past 6 days with no improvement.  Patient states when she takes pain medication she does get some improvement but it does not last long.  Patient also does report having some nausea with pain episodes.  Patient denies any shortness of breath, chest pain, vomiting, urinary symptoms, vaginal discharge.    Upon evaluation, patient found in no acute distress, afebrile, and with stable vitals. Physical exam notable for tenderness in the left upper quad. Lab examination sodium 134 which was corrected with normal saline 1 L bolus.  Troponin levels were normal.  No imaging performed at this time, patient has had multiple CT scans of abdomen pelvis over the last couple weeks and also MRI performed which were all negative.  EKG was normal.  Patient received patient received GI cocktail Carafate, morphine while in the ED. pain was controlled after use of morphine.  Discussed with patient that she needs to follow-up with a gastroenterologist that this could be a ulcer related therefore she may need a scope.  At this time, patient stable for discharge. Patient encouraged to follow up with their PCP, take all medications as directed, and given strict return protocols.                Mercer Pod, FNP, participated in the evaluation, management and treatment of this patient. The orders and chart were reviewed and approved by me.       Theodora Blow, FNP  12/12/22 [phone]

## 2022-12-20 ENCOUNTER — Ambulatory Visit: Admit: 2022-12-20 | Discharge: 2022-12-21 | Payer: PRIVATE HEALTH INSURANCE | Attending: Family | Primary: Family

## 2022-12-20 DIAGNOSIS — L97912 Non-pressure chronic ulcer of unspecified part of right lower leg with fat layer exposed: Principal | ICD-10-CM

## 2022-12-20 NOTE — Unmapped (Signed)
Cleanse wound with saline, apply Prisma, cover with bordered foam dsg. Change in 1 week. RTC in 1 week
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
## 2022-12-27 ENCOUNTER — Ambulatory Visit: Admit: 2022-12-27 | Discharge: 2022-12-28 | Payer: PRIVATE HEALTH INSURANCE | Attending: Family | Primary: Family

## 2022-12-27 NOTE — Unmapped (Signed)
Cleanse wound with saline, apply Prisma, cover with bordered foam dsg. Change in 1 week. RTC in 1 week
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
## 2023-01-02 ENCOUNTER — Ambulatory Visit: Admit: 2023-01-02 | Payer: PRIVATE HEALTH INSURANCE

## 2023-01-03 ENCOUNTER — Ambulatory Visit: Admit: 2023-01-03 | Discharge: 2023-01-04 | Payer: PRIVATE HEALTH INSURANCE | Attending: Family | Primary: Family

## 2023-01-03 DIAGNOSIS — L97912 Non-pressure chronic ulcer of unspecified part of right lower leg with fat layer exposed: Principal | ICD-10-CM

## 2023-01-03 NOTE — Unmapped (Signed)
Patient did not show for their rheumatology appointment today.
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
## 2023-01-23 ENCOUNTER — Encounter: Admit: 2023-01-23 | Discharge: 2023-01-23 | Payer: PRIVATE HEALTH INSURANCE

## 2023-01-23 ENCOUNTER — Ambulatory Visit: Admit: 2023-01-23 | Discharge: 2023-01-23 | Payer: PRIVATE HEALTH INSURANCE

## 2023-01-23 LAB — DRUG SCREEN, URINE
AMPHETAMINE SCREEN URINE: NEGATIVE
BARBITURATE SCREEN URINE: NEGATIVE
BENZODIAZEPINE SCREEN, URINE: NEGATIVE
CANNABINOID SCREEN URINE: POSITIVE — AB
COCAINE(METAB.)SCREEN, URINE: NEGATIVE
FENTANYL SCREEN, URINE: NEGATIVE
METHADONE METABOLITE: NEGATIVE
METHAQUALONE SCREEN URINE: NEGATIVE
OPIATE SCREEN URINE: POSITIVE — AB
OXYCODONE SCREEN URINE: NEGATIVE
PCP SCREEN, URINE: NEGATIVE

## 2023-01-23 MED ORDER — DEXLANSOPRAZOLE 60 MG CAPSULE,BIPHASE DELAYED RELEASE
ORAL_CAPSULE | Freq: Every day | ORAL | 5 refills | 30 days | Status: CP
Start: 2023-01-23 — End: 2023-07-22

## 2023-01-23 MED ADMIN — lidocaine (PF) (XYLOCAINE-MPF) 20 mg/mL (2 %) injection: INTRAVENOUS | @ 20:00:00 | Stop: 2023-01-23

## 2023-01-23 MED ADMIN — lactated Ringers infusion: 30 mL/h | INTRAVENOUS | @ 20:00:00 | Stop: 2023-01-23

## 2023-01-23 MED ADMIN — Propofol (DIPRIVAN) injection: INTRAVENOUS | @ 20:00:00 | Stop: 2023-01-23

## 2023-01-23 MED ADMIN — lactated Ringers infusion: 30 mL/h | INTRAVENOUS | @ 18:00:00 | Stop: 2023-01-23

## 2023-02-03 DIAGNOSIS — L732 Hidradenitis suppurativa: Principal | ICD-10-CM

## 2023-02-03 DIAGNOSIS — M359 Systemic involvement of connective tissue, unspecified: Principal | ICD-10-CM

## 2023-02-03 MED ORDER — PREDNISONE 5 MG TABLET
ORAL_TABLET | 0 refills | 0 days
Start: 2023-02-03 — End: ?

## 2023-02-04 MED ORDER — PREDNISONE 5 MG TABLET
ORAL_TABLET | 0 refills | 0 days
Start: 2023-02-04 — End: ?

## 2023-02-04 NOTE — Unmapped (Signed)
Discontinued 1/24

## 2023-02-18 ENCOUNTER — Ambulatory Visit
Admit: 2023-02-18 | Discharge: 2023-02-19 | Disposition: A | Discharge status: Home / Self Care | Payer: PRIVATE HEALTH INSURANCE

## 2023-02-18 ENCOUNTER — Emergency Department
Admit: 2023-02-18 | Discharge: 2023-02-19 | Disposition: A | Discharge status: Home / Self Care | Payer: PRIVATE HEALTH INSURANCE

## 2023-02-18 DIAGNOSIS — R1084 Generalized abdominal pain: Principal | ICD-10-CM

## 2023-02-18 LAB — CBC W/ AUTO DIFF
BASOPHILS ABSOLUTE COUNT: 0.1 10*9/L (ref 0.0–0.4)
BASOPHILS RELATIVE PERCENT: 0.8 %
EOSINOPHILS ABSOLUTE COUNT: 0.3 10*9/L (ref 0.0–0.7)
EOSINOPHILS RELATIVE PERCENT: 2.1 %
HEMATOCRIT: 40.7 %
HEMOGLOBIN: 12.8 g/dL
IMMATURE GRANULOCYTES ABSOLUTE COUNT: 0.1 10*9/L (ref 0.0–0.6)
IMMATURE GRANULOCYTES RELATIVE PERCENT: 0.6 %
LYMPHOCYTES ABSOLUTE COUNT: 2.3 10*9/L (ref 1.2–3.4)
LYMPHOCYTES RELATIVE PERCENT: 18.5 %
MEAN CORPUSCULAR HEMOGLOBIN CONC: 31.4 g/dL (ref 31.0–35.5)
MEAN CORPUSCULAR HEMOGLOBIN: 25.7 pg — ABNORMAL LOW (ref 26.0–34.0)
MEAN CORPUSCULAR VOLUME: 81.6 fL
MEAN PLATELET VOLUME: 10.3 fL — ABNORMAL HIGH (ref 7.0–10.0)
MONOCYTES ABSOLUTE COUNT: 0.9 10*9/L — ABNORMAL HIGH (ref 0.1–0.5)
MONOCYTES RELATIVE PERCENT: 7 %
NEUTROPHILS ABSOLUTE COUNT: 8.7 10*9/L — ABNORMAL HIGH (ref 1.4–6.5)
NEUTROPHILS RELATIVE PERCENT: 71 %
PLATELET COUNT: 477 10*9/L — ABNORMAL HIGH (ref 130–400)
RED BLOOD CELL COUNT: 4.99 10*12/L
RED CELL DISTRIBUTION WIDTH: 14 % (ref 11.5–15.5)
WBC ADJUSTED: 12.3 10*9/L — ABNORMAL HIGH (ref 4.8–10.8)

## 2023-02-18 LAB — COMPREHENSIVE METABOLIC PANEL
ALBUMIN/GLOBULIN RATIO: 1.1
ALBUMIN: 3.2 g/dL (ref 3.1–4.7)
ALKALINE PHOSPHATASE: 88 U/L (ref 33–120)
ALT (SGPT): 4 U/L
ANION GAP: 8 mmol/L (ref 5–16)
AST (SGOT): 7 U/L — ABNORMAL LOW (ref 10–40)
BILIRUBIN TOTAL: 0.5 mg/dL (ref 0.2–1.2)
BLOOD UREA NITROGEN: 3 mg/dL — ABNORMAL LOW (ref 8–21)
BUN / CREAT RATIO: 8 (ref 6–20)
CALCIUM: 8.6 mg/dL (ref 8.5–10.4)
CHLORIDE: 100 mmol/L (ref 98–110)
CO2: 26 mmol/L (ref 24.0–31.0)
CREATININE: 0.4 mg/dL — ABNORMAL LOW (ref 0.50–1.40)
EGFR CKD-EPI (2021) FEMALE: >90 mL/min/{1.73_m2} (ref >=60)
GLOBULIN, TOTAL: 2.8 g/dL
GLUCOSE RANDOM: 95 mg/dL (ref 75–110)
OSMOLALITY CALCULATED: 265 mosm/kg
POTASSIUM: 3.6 mmol/L (ref 3.5–5.3)
PROTEIN TOTAL: 6 g/dL (ref 6.0–8.0)
SODIUM: 134 mmol/L — ABNORMAL LOW (ref 135–153)

## 2023-02-18 LAB — URINALYSIS WITH MICROSCOPY
BILIRUBIN UA: NEGATIVE
BLOOD UA: NEGATIVE
GLUCOSE UA: NEGATIVE
KETONES UA: NEGATIVE
LEUKOCYTE ESTERASE UA: NEGATIVE
NITRITE UA: NEGATIVE
PH UA: 8 (ref 5.0–9.0)
PROTEIN UA: NEGATIVE
RBC UA: 1 /HPF (ref 0–2)
SPECIFIC GRAVITY UA: 1.012 — ABNORMAL LOW (ref 1.016–1.022)
SQUAMOUS EPITHELIAL: <1 /HPF (ref 0–4)
UROBILINOGEN UA: NORMAL
WBC UA: 2 /HPF (ref 0–4)

## 2023-02-18 LAB — DRUG SCREEN, URINE
AMPHETAMINE SCREEN URINE: NEGATIVE
BARBITURATE SCREEN URINE: NEGATIVE
BENZODIAZEPINE SCREEN, URINE: NEGATIVE
CANNABINOID SCREEN URINE: NEGATIVE
COCAINE(METAB.)SCREEN, URINE: NEGATIVE
FENTANYL SCREEN, URINE: NEGATIVE
METHADONE METABOLITE: NEGATIVE
METHAQUALONE SCREEN URINE: NEGATIVE
OPIATE SCREEN URINE: POSITIVE — AB
OXYCODONE SCREEN URINE: NEGATIVE
PCP SCREEN, URINE: NEGATIVE

## 2023-02-18 LAB — LIPASE: LIPASE: 29 U/L (ref 11–82)

## 2023-02-18 MED ADMIN — dicyclomine (BENTYL) capsule 20 mg: 20 mg | ORAL | @ 23:00:00 | Stop: 2023-02-18

## 2023-02-18 MED ADMIN — acetaminophen (TYLENOL) tablet 975 mg: 1000 mg | ORAL | @ 23:00:00 | Stop: 2023-02-18

## 2023-02-18 NOTE — Unmapped (Signed)
Bed: 28  Expected date:   Expected time:   Means of arrival:   Comments:  ems
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
## 2023-02-19 NOTE — Unmapped (Signed)
EMERGENCY MEDICINE        CHIEF COMPLAINT:  Chief Complaint   Patient presents with    Headache Recurrent or Known Dx Migraines    Numbness    Abdominal Pain    Nausea         Kim Charles is a 42 y.o. female patient who presents to the ED with a chief complaint of abdominal pain/cramping and headache. Patient reports being recently admitted to [facility] for similar issues and diagnosed with an inflamed pancreas. Patient underwent an EGD on 01/23/2023 with no esophageal lesions or bleeding ulcers identified. Patient denies fever, dizziness, weakness, chest pain, shortness of breath, vomiting, or diarrhea at this time.                    PAST MEDICAL HISTORY:  Past Medical History:   Diagnosis Date    ADHD (attention deficit hyperactivity disorder)     Allergic 2007    Allergic to shrimp    Anemia     Asthma     Chronic pain     Disorder of skin or subcutaneous tissue 2001    Hidradenitis suprative    Fibromyalgia     Hidradenitis suppurativa     Hypertension     Leukemia (CMS-HCC)     Lung disease 2017    COPD    Lupus (systemic lupus erythematosus) (CMS-HCC)     Migraines     Osteoarthritis     Scoliosis        PAST SURGICAL HISTORY:  Past Surgical History:   Procedure Laterality Date    CESAREAN SECTION      LAPAROSCOPIC ENDOMETRIOSIS FULGURATION      PARTIAL HYSTERECTOMY      PR UPPER GI ENDOSCOPY,DIAGNOSIS N/A 01/23/2023    Procedure: EGD;  Surgeon: Dory Peru, MD;  Location: ENDO OR [facility];  Service: Gastroenterology       MEDICATIONS:  Prior to Admission medications    Medication Sig Start Date End Date Taking? Authorizing Provider   albuterol (PROVENTIL HFA;VENTOLIN HFA) 90 mcg/actuation inhaler Inhale 2 puffs every six (6) hours as needed. 05/17/22   Wilfred Curtis, FNP   amLODIPine (NORVASC) 10 MG tablet Take 1 tablet (10 mg total) by mouth daily. 07/26/22 07/26/23  Artist Beach Hammonds, FNP   aspirin-acetaminophen-caffeine (EXCEDRIN MIGRAINE) 250-250-65 mg per tablet Take 2 tablets by mouth.    [provider]   beclomethasone (QVAR) 40 mcg/actuation inhaler Inhale 2 puffs daily as needed. 03/09/15   [provider]   benzonatate (TESSALON) 200 MG capsule Take 1 capsule (200 mg total) by mouth Three (3) times a day as needed for cough. 09/28/21   Chari Manning, DNP   cloNIDine HCL (CATAPRES) 0.1 MG tablet Take 1 tablet (0.1 mg total) by mouth Two (2) times a day. 07/26/22   Artist Beach Hammonds, FNP   cyclobenzaprine (FLEXERIL) 10 MG tablet Take 1 tablet (10 mg total) by mouth two (2) times a day as needed for muscle spasms. 04/22/22   Salvadore Farber, DO   dexlansoprazole [facility]) 60 mg capsule Take 1 capsule (60 mg total) by mouth daily. 01/23/23 07/22/23  Dory Peru, MD   diclofenac sodium (VOLTAREN) 1 % gel Apply 2 g topically four (4) times a day. 08/30/22 08/30/23  Gretel Acre, PA   empty container (SHARPS [place]) Misc Use as directed 09/29/20   Liz Malady, MD   empty container Misc Use as directed 11/08/21   Alvina Filbert,  Jackelyn Poling, MD   empty container Misc Use as directed 03/12/22   Ardith Dark, MD   EPINEPHrine [facility]) 0.3 mg/0.3 mL injection Inject 0.3 mL (0.3 mg total) into the muscle. 07/08/15   [provider]   escitalopram oxalate (LEXAPRO) 10 MG tablet Take 1 tablet (10 mg total) by mouth daily. 04/29/18   [provider]   famotidine (PEPCID) 20 MG tablet Take 1 tablet (20 mg total) by mouth two (2) times a day for 15 days. 12/12/22 12/27/22  Theodora Blow, FNP   fluticasone propionate (FLOVENT HFA) 220 mcg/actuation inhaler 2 puffs. 11/05/17   [provider]   gabapentin (NEURONTIN) 600 MG tablet Take 1 tablet (600 mg total) by mouth Three (3) times a day. 07/26/22 07/26/23  Artist Beach Hammonds, FNP   HUMIRA PEN CITRATE FREE 40 MG/0.4 ML Inject the contents of 1 pen (40 mg total) under the skin every seven (7) days. 08/09/22   Elsie Stain, MD   hydroCHLOROthiazide (HYDRODIURIL) 50 MG tablet Take 1 tablet (50 mg total) by mouth daily. 07/26/22 07/26/23  Artist Beach Hammonds, FNP   HYDROcodone-acetaminophen (NORCO 10-325) 10-325 mg per tablet Take 1 tablet by mouth 5 times daily as needed for pain 10/20/19   [provider]   hydroxychloroquine (PLAQUENIL) 200 mg tablet Take 2 tablets (400 mg total) by mouth daily. 06/25/22   Ethlyn Gallery, MD   [facility] ER 20 mg TP24 Take 1 tablet by mouth every 24 hours 10/01/19   [provider]   ipratropium (ATROVENT) 0.03 % nasal spray 2 sprays into each nostril.    [provider]   lisinopriL (PRINIVIL,ZESTRIL) 20 MG tablet Take 1 tablet (20 mg total) by mouth daily. 07/26/22 02/21/23  Artist Beach Hammonds, FNP   metFORMIN (GLUCOPHAGE) 500 MG tablet TAKE 1 TABLET BY MOUTH 2 TIMES DAILY WITH MEALS 01/19/20   Kirke Shaggy, MD   MITIGARE 0.6 mg cap capsule Take 1 capsule (0.6 mg total) by mouth daily. 12/22/18   [provider]   montelukast (SINGULAIR) 10 mg tablet Take 1 tablet (10 mg total) by mouth nightly. 09/28/21   Chari Manning, DNP   NARCAN 4 mg/actuation nasal spray spray (4 mg) in 1 nostril may repeat dose every 2-3 minutes as needed alternating nostrils with each dose 08/19/19   [provider]   ondansetron (ZOFRAN-ODT) 4 MG disintegrating tablet Take 1 tablet (4 mg total) by mouth every eight (8) hours as needed for nausea for up to 7 doses. 12/01/22   Westly Pam, DO   promethazine (PHENERGAN) 25 MG tablet Take 1 tablet (25 mg total) by mouth every six (6) hours as needed for nausea. 10/29/22   Ishizawar, Jaci Lazier, MD   propranoloL (INDERAL) 40 MG tablet Take 1 tablet (40 mg total) by mouth Two (2) times a day. 07/26/22   Artist Beach Hammonds, FNP   rifAMPin (RIFADIN) 300 MG capsule Take 1 capsule (300 mg total) by mouth two (2) times a day. 10/10/21   Ardith Dark, MD   spironolactone (ALDACTONE) 25 MG tablet Take 1 tablet (25 mg total) by mouth daily. 07/26/22   Artist Beach Hammonds, FNP   sucralfate (CARAFATE) 1 gram tablet Take 1 tablet (1 g total) by mouth four (4) times a day. 01/23/23 02/22/23  Dory Peru, MD   syringe with needle (BD TUBERCULIN SYRINGE) 1 mL 25 gauge x 5/8 Syrg Use 1 syringe once a week for methotrexate injections. 03/01/21  Ishizawar, Jaci Lazier, MD   topiramate (TOPAMAX) 50 MG tablet TAKE 1 TABLET BY MOUTH 2 TIMES DAILY 04/29/18   [provider]   traZODone (DESYREL) 50 MG tablet Take 1 tablet (50 mg total) by mouth. 09/26/17   [provider]   VYVANSE 30 mg capsule Take 1 capsule (30 mg total) by mouth two (2) times a day. 04/24/18   [provider]       ALLERGIES:  Allergies   Allergen Reactions    Other Hives, Itching and Other (See Comments)     High fever. Hives and itching all over the body.    Shellfish Containing Products Anaphylaxis    Sulfasalazine Hives    Toradol [Ketorolac] Hives    Venom-Honey Bee Anaphylaxis    Opioids - Morphine Analogues      Other reaction(s): Other  Other reaction(s): Other (See Comments)  Causes migraine  Headache    Tramadol Nausea And Vomiting    Hydromorphone Hcl      migraine    Naproxen      Grits teeth       FAMILY HISTORY:  Family History   Problem Relation Age of Onset    Thyroid disease Mother     Diabetes Mother     Lupus Father         My fathers nephew and sister    Diabetes Father     Cancer Sister     Diabetes Brother     Cancer Maternal Uncle     Osteoarthritis Paternal Aunt     Stroke Paternal Uncle     Cancer Maternal Grandmother     Basal cell carcinoma Neg Hx     Melanoma Neg Hx     Squamous cell carcinoma Neg Hx     Glaucoma Neg Hx     Macular degeneration Neg Hx        SOCIAL HISTORY:  Social History     Tobacco Use    Smoking status: Every Day     Current packs/day: 1.00     Average packs/day: 1 pack/day for 15.0 years (15.0 ttl pk-yrs)     Types: Cigarettes    Smokeless tobacco: Never   Substance Use Topics    Alcohol use: No REVIEW OF SYMPTOMS:  Review of Systems   Constitutional: Negative.    HENT: Negative.     Eyes: Negative.    Respiratory:  Negative for cough, chest tightness and shortness of breath.    Cardiovascular:  Negative for chest pain.   Gastrointestinal:  Positive for abdominal pain and nausea. Negative for constipation, diarrhea and vomiting.   Endocrine: Negative.    Genitourinary: Negative.    Musculoskeletal: Negative.    Skin: Negative.    Allergic/Immunologic: Negative.    Neurological:  Positive for headaches.   Hematological: Negative.    Psychiatric/Behavioral: Negative.       All other systems reviewed and are negative        PHYSICAL EXAM:  Vitals:    02/18/23 1622 02/18/23 1714   BP: 190/129 177/113   Pulse: 110 107   Resp: 20 18   Temp: 36.8 ??C (98.3 ??F)    TempSrc: Oral    SpO2: 96% 98%   Weight: 64.4 kg (142 lb)    Height: 154.9 cm (5' 1)        Physical Exam  Vitals and nursing note reviewed.   Constitutional:       General: She is not in acute  distress.     Appearance: Normal appearance. She is not ill-appearing or toxic-appearing.   Cardiovascular:      Rate and Rhythm: Normal rate and regular rhythm.      Pulses: Normal pulses.      Heart sounds: Normal heart sounds.   Pulmonary:      Effort: Pulmonary effort is normal. No respiratory distress.      Breath sounds: Normal breath sounds.   Abdominal:      General: Abdomen is flat.      Palpations: Abdomen is soft.      Tenderness: There is generalized abdominal tenderness. There is no right CVA tenderness, left CVA tenderness, guarding or rebound.   Musculoskeletal:         General: Normal range of motion.   Skin:     General: Skin is warm and dry.      Capillary Refill: Capillary refill takes less than 2 seconds.   Neurological:      General: No focal deficit present.      Mental Status: She is alert and oriented to person, place, and time.      GCS: GCS eye subscore is 4. GCS verbal subscore is 5. GCS motor subscore is 6.      Cranial Nerves: Cranial nerves 2-12 are intact. No cranial nerve deficit.      Sensory: Sensation is intact. No sensory deficit.      Motor: Motor function is intact. No weakness.      Coordination: Coordination is intact.      Gait: Gait is intact.   Psychiatric:         Mood and Affect: Mood normal.         Behavior: Behavior normal.                DIAGNOSTICS:   Imaging:  XR Chest 2 views   Final Result     No acute pulmonary disease.   Electronically signed by:  Joesphine Bare MD  02/18/2023 05:33 PM EST Workstation: [id number]      CT Head Wo Contrast   Final Result   No acute intracranial hemorrhage is identified. With a history of headaches, also consider contrast enhanced CT scan or MRI for increased sensitivity as deemed clinically appropriate.   Electronically signed by:  Abigail Miyamoto MD  02/18/2023 04:50 PM EST Workstation: [id number]          Labs:  Labs Reviewed   COMPREHENSIVE METABOLIC PANEL - Abnormal; Notable for the following components:       Result Value    Sodium 134 (*)     BUN 3 (*)     Creatinine 0.40 (*)     AST 7 (*)     All other components within normal limits   URINALYSIS WITH MICROSCOPY - Abnormal; Notable for the following components:    Specific Gravity, UA 1.012 (*)     All other components within normal limits   DRUG SCREEN, URINE - Abnormal; Notable for the following components:    Opiates Screen, Ur Positive (*)     All other components within normal limits    Narrative:     Drug of Abuse for medical purposes only. The positive results have not been confirmed by GC/MS.    Drug Class Screening Cut off Limits  Amphetamine            <1000 ng/ml   Barbiturate            <200 ng/ml  Benzodiazepine         <200 ng/ml   Cannibinoid (THC)      <50 ng/ml   Cocaine                <300 ng/ml   Methadone              <300 ng/ml   Methaqualone           <300 ng/ml  Opiate                 <300 ng/ml  Oxycodone              <100 ng/ml  Phencyclidine (PCP)    <25 ng/ml   Fentanyl               <1 ng/ml   CBC W/ AUTO DIFF - Abnormal; Notable for the following components:    WBC 12.3 (*)     MCH 25.7 (*)     MPV 10.3 (*)     Platelet 477 (*)     Absolute Neutrophils 8.7 (*)     Absolute Monocytes 0.9 (*)     All other components within normal limits   LIPASE - Normal   CBC W/ DIFFERENTIAL    Narrative:     The following orders were created for panel order CBC w/ Differential.  Procedure                               Abnormality         Status                     ---------                               -----------         ------                     CBC w/ [id number]]         Abnormal            Final result                 Please view results for these tests on the individual orders.       Procedures:  Procedures      ED COURSE AND MEDICAL DECISION MAKING:       Old records reviewed.    Orders Placed This Encounter   Procedures    CT Head Wo Contrast    XR Chest 2 views    CBC w/ Differential    Comprehensive Metabolic Panel    Urinalysis with Microscopy    Drug Screen, Urine    Lipase       Medications   acetaminophen (TYLENOL) tablet 975 mg (975 mg Oral Given 02/18/23 1804)   dicyclomine (BENTYL) capsule 20 mg (20 mg Oral Given 02/18/23 1804)       ED Course as of 02/18/23 1911   Mon Feb 18, 2023   1639 WBC(!): 12.3  No infectious process identified, likely reactive to pain   1639 Absolute Neutrophils(!): 8.7   1639 Platelet(!): 477   1706 CT Head Wo Contrast  No acute intracranial hemorrhage is identified. With a history of headaches, also consider contrast enhanced CT scan or MRI for increased sensitivity as deemed clinically appropriate.  1739 XR Chest 2 views    No acute pulmonary disease.             EKG  Encounter Date: 12/12/22   ECG 12 Lead   Result Value    EKG Systolic BP     EKG Diastolic BP     EKG Ventricular Rate 99    EKG Atrial Rate 99    EKG P-R Interval 130    EKG QRS Duration 108    EKG Q-T Interval 366    EKG QTC Calculation 469    EKG Calculated P Axis 55    EKG Calculated R Axis 32 EKG Calculated T Axis 27    QTC Fredericia 432    Narrative    Normal sinus rhythm  Incomplete right bundle branch block  Borderline ECG  No previous ECGs available  Confirmed by Deretha Emory [phone]) on 12/13/2022 2:58:00 PM         FINAL IMPRESSION:  Final diagnoses:   Generalized abdominal pain (Primary)         Rx:   Medications Prescribed Today               dicyclomine (BENTYL) 20 mg tablet Take 1 tablet (20 mg total) by mouth two (2) times a day for 10 days.    ondansetron (ZOFRAN-ODT) 4 MG disintegrating tablet Take 1 tablet (4 mg total) by mouth every eight (8) hours as needed for nausea for up to 7 days.              Medical Decision Making:  Medical Decision Making  Upon evaluation, patient found in no acute distress, afebrile, hypertensive with otherwise stable vitals. Physical exam notable for generalized abdominal tenderness to palpation with no guarding or rebound. Lungs clear to auscultation bilaterally, heart sounds normal. Non focal neuro exam with CN II-XII grossly intact. Patient ambulatory with steady gait. Lab examination notable for mild leukocytosis, no infectious process identified therefore likely reactive. Metabolic panel without major electrolyte derangements and with stable renal function. Lipase WNL. Urinalysis without evidence of infection. CXR reveals no acute intrathoracic process. Non contrast head CT reveals no intracranial process. Patient received acetaminophen and dicyclomine while in the ED. At this time, patient stable for discharge and prescribed dicyclomine. Patient encouraged to follow up with gastroenterology, their PCP, take all medications as directed, and given strict return protocols.      Amount and/or Complexity of Data Reviewed  Labs:  Decision-making details documented in ED Course.  Radiology:  Decision-making details documented in ED Course.    Risk  OTC drugs.  Prescription drug management.            Jerelyn Scott, PA, participated in the evaluation, management and treatment of this patient. The orders and chart were reviewed and approved by me.       Addison Naegeli, [place]  02/18/23 1911

## 2023-02-19 NOTE — Unmapped (Signed)
Pt requesting pain medication for gastritis and headache. Provider made aware.

## 2023-02-21 DIAGNOSIS — K56609 Unspecified intestinal obstruction, unspecified as to partial versus complete obstruction: Principal | ICD-10-CM

## 2023-02-21 DIAGNOSIS — K279 Peptic ulcer, site unspecified, unspecified as acute or chronic, without hemorrhage or perforation: Principal | ICD-10-CM

## 2023-03-05 ENCOUNTER — Encounter: Admit: 2023-03-05 | Discharge: 2023-03-05 | Payer: PRIVATE HEALTH INSURANCE | Attending: Family | Primary: Family

## 2023-03-07 NOTE — Unmapped (Signed)
This encounter was created in error - please disregard.

## 2023-03-13 ENCOUNTER — Encounter: Admit: 2023-03-13 | Discharge: 2023-03-13 | Payer: PRIVATE HEALTH INSURANCE | Attending: Family | Primary: Family

## 2023-03-20 ENCOUNTER — Ambulatory Visit: Admit: 2023-03-20 | Discharge: 2023-03-21 | Payer: PRIVATE HEALTH INSURANCE

## 2023-03-20 DIAGNOSIS — K279 Peptic ulcer, site unspecified, unspecified as acute or chronic, without hemorrhage or perforation: Principal | ICD-10-CM

## 2023-03-20 NOTE — Unmapped (Signed)
Consult Note      Date:           03/20/2023   Provider:    Deniece Ree, MD  Patient:      Kim Charles   DOB:        12/03/1981  AGE:          42 y.o.   SEX:          female      PCP: Iran Planas, FNP    Subjective:     Chief Complaint:   Chief Complaint   Patient presents with    Peptic ulcer without hemorrhage or perforation but with obs       HPI:  Problem List Items Addressed This Visit    None  Visit Diagnoses       Peptic ulcer disease    -  Primary    Relevant Orders    Ambulatory referral to General Surgery          Kim Charles is a 42 y.o., year old female, whom presents for evaluation of gastric ulcers.  Patient has past medical history of lupus, asthma, fibromyalgia, hidradenitis suppurativa, hypertension, leukemia, and COVID-19. She is on Humira, Plaquenil and hydrocodone.  Patient reports intermittent left upper quadrant and epigastric pain since November 2023. Pain is post-prandial and occurs after eating certain foods including fried food and spicy food. Pain is associated with nausea. No vomiting.  She underwent upper endoscopy by Dr. Blanchard Mane gastroenterologist on 01/23/2023 that showed 2 non-bleeding linear and superficial gastric ulcers in the antrum largest measuring 25 mm, gastric antral erosions and gastritis. No obstruction. Able to advance scope in the duodenum. Pathology showed superficial chronic gastritis and H. pylori negative.  Patient was taking Excedrin before endoscopy which she quit taking after she was diagnosed with gastric ulcers. She is taking Dexilant daily since her upper endoscopy, she is not sure about the dose and reports it does not help.  She is a current smoker and smokes half to 1 pack a day.  Patient states that she had 6 bleeding ulcers in the stomach at age 78 and those ulcers went away with treatment.    Her CT abdomen and pelvis on 12/03/2022 reported as no acute findings of the abdomen or pelvis.    ROS:  Review of Systems   Constitutional:  Negative for fever.   Respiratory:  Positive for cough. Negative for shortness of breath.    Cardiovascular:  Negative for chest pain.   Gastrointestinal:  Positive for abdominal pain. Negative for vomiting.   Genitourinary:  Negative for difficulty urinating.   Psychiatric/Behavioral:  Negative for agitation.           Medication History:   Current Outpatient Medications Ordered in Epic   Medication Sig Dispense Refill    albuterol (PROVENTIL HFA;VENTOLIN HFA) 90 mcg/actuation inhaler Inhale 2 puffs every six (6) hours as needed. 18 g 3    beclomethasone (QVAR) 40 mcg/actuation inhaler Inhale 2 puffs daily as needed.      cloNIDine HCL (CATAPRES) 0.1 MG tablet Take 1 tablet (0.1 mg total) by mouth two (2) times a day. 180 tablet 1    dexlansoprazole (DEXILANT) 60 mg capsule Take 1 capsule (60 mg total) by mouth daily. 30 capsule 5    diclofenac sodium (VOLTAREN) 1 % gel Apply 2 g topically four (4) times a day. 100 g 0    escitalopram oxalate (LEXAPRO) 10 MG tablet Take 1 tablet (10 mg  total) by mouth daily.  3    famotidine (PEPCID) 20 MG tablet Take 1 tablet (20 mg total) by mouth two (2) times a day for 15 days. 30 tablet 0    fluticasone propionate (FLOVENT HFA) 220 mcg/actuation inhaler 2 puffs.      gabapentin (NEURONTIN) 600 MG tablet Take 1 tablet (600 mg total) by mouth Three (3) times a day. 270 tablet 3    HUMIRA PEN CITRATE FREE 40 MG/0.4 ML Inject the contents of 1 pen (40 mg total) under the skin every seven (7) days. 4 each 4    hydroCHLOROthiazide (HYDRODIURIL) 50 MG tablet Take 1 tablet (50 mg total) by mouth daily. 90 tablet 0    HYDROcodone-acetaminophen (NORCO 10-325) 10-325 mg per tablet Take 1 tablet by mouth 5 times daily as needed for pain      hydroxychloroquine (PLAQUENIL) 200 mg tablet Take 2 tablets (400 mg total) by mouth daily. 180 tablet 3    HYSINGLA ER 20 mg TP24 Take 1 tablet by mouth every 24 hours      lisinopril (PRINIVIL,ZESTRIL) 20 MG tablet Take 1 tablet (20 mg total) by mouth daily. 90 tablet 1    metFORMIN (GLUCOPHAGE) 500 MG tablet TAKE 1 TABLET BY MOUTH 2 TIMES DAILY WITH MEALS 180 tablet 3    MITIGARE 0.6 mg cap capsule Take 1 capsule (0.6 mg total) by mouth daily.      montelukast (SINGULAIR) 10 mg tablet Take 1 tablet (10 mg total) by mouth nightly. 90 tablet 1    ondansetron (ZOFRAN-ODT) 4 MG disintegrating tablet Take 1 tablet (4 mg total) by mouth every eight (8) hours as needed for nausea for up to 7 doses. 7 tablet 0    potassium chloride 10 MEQ ER tablet       promethazine (PHENERGAN) 25 MG tablet Take 1 tablet (25 mg total) by mouth every six (6) hours as needed for nausea. 30 tablet 1    propranolol (INDERAL) 40 MG tablet Take 1 tablet (40 mg total) by mouth two (2) times a day. 180 tablet 1    rifAMPin (RIFADIN) 300 MG capsule Take 1 capsule (300 mg total) by mouth two (2) times a day. 60 capsule 2    spironolactone (ALDACTONE) 25 MG tablet Take 1 tablet (25 mg total) by mouth daily. 90 tablet 1    sucralfate (CARAFATE) 1 gram tablet Take 1 tablet (1 g total) by mouth four (4) times a day. 120 tablet 0    topiramate (TOPAMAX) 50 MG tablet TAKE 1 TABLET BY MOUTH 2 TIMES DAILY  5    traZODone (DESYREL) 50 MG tablet Take 1 tablet (50 mg total) by mouth.      VYVANSE 30 mg capsule Take 1 capsule (30 mg total) by mouth two (2) times a day.  0    amlodipine (NORVASC) 10 MG tablet Take 1 tablet (10 mg total) by mouth daily. 90 tablet 1    aspirin-acetaminophen-caffeine (EXCEDRIN MIGRAINE) 250-250-65 mg per tablet Take 2 tablets by mouth. (Patient not taking: Reported on 03/05/2023)      benzonatate (TESSALON) 200 MG capsule Take 1 capsule (200 mg total) by mouth Three (3) times a day as needed for cough. (Patient not taking: Reported on 03/20/2023) 20 capsule 0    cyclobenzaprine (FLEXERIL) 10 MG tablet Take 1 tablet (10 mg total) by mouth two (2) times a day as needed for muscle spasms. (Patient not taking: Reported on 03/05/2023) 20 tablet 0    dicyclomine (  BENTYL) 20 mg tablet Take 1 tablet (20 mg total) by mouth two (2) times a day. 180 tablet 1    doxycycline (VIBRAMYCIN) 100 MG capsule Take 1 capsule (100 mg total) by mouth two (2) times a day. (Patient not taking: Reported on 03/05/2023) 14 capsule 0    empty container (SHARPS CONTAINER) Misc Use as directed (Patient not taking: Reported on 03/20/2023) 1 each 2    empty container Misc Use as directed (Patient not taking: Reported on 03/20/2023) 1 each 0    empty container Misc Use as directed (Patient not taking: Reported on 03/20/2023) 1 each 4    EPINEPHrine (EPIPEN) 0.3 mg/0.3 mL injection Inject 0.3 mL (0.3 mg total) into the muscle. (Patient not taking: Reported on 03/20/2023)      ipratropium (ATROVENT) 0.03 % nasal spray 2 sprays into each nostril. (Patient not taking: Reported on 03/20/2023)      NARCAN 4 mg/actuation nasal spray spray (4 mg) in 1 nostril may repeat dose every 2-3 minutes as needed alternating nostrils with each dose (Patient not taking: Reported on 03/20/2023)      syringe with needle (BD TUBERCULIN SYRINGE) 1 mL 25 gauge x 5/8 Syrg Use 1 syringe once a week for methotrexate injections. (Patient not taking: Reported on 03/20/2023) 50 each 7     No current Epic-ordered facility-administered medications on file.       Allergies:    Allergies   Allergen Reactions    Other Hives, Itching and Other (See Comments)     High fever. Hives and itching all over the body.    Shellfish Containing Products Anaphylaxis    Sulfasalazine Hives    Toradol [Ketorolac] Hives    Venom-Honey Bee Anaphylaxis    Opioids - Morphine Analogues      Other reaction(s): Other  Other reaction(s): Other (See Comments)  Causes migraine  Headache    Tramadol Nausea And Vomiting    Hydromorphone Hcl      migraine    Naproxen      Grits teeth       Surgical History:    Past Surgical History:   Procedure Laterality Date    CESAREAN SECTION      LAPAROSCOPIC ENDOMETRIOSIS FULGURATION      PARTIAL HYSTERECTOMY      PR UPPER GI ENDOSCOPY,DIAGNOSIS N/A 01/23/2023 Procedure: EGD;  Surgeon: Dory Peru, MD;  Location: ENDO OR [facility];  Service: Gastroenterology       Family History:   Family History   Problem Relation Age of Onset    Thyroid disease Mother     Diabetes Mother     Lupus Father         My fathers nephew and sister    Diabetes Father     Cancer Sister     Diabetes Brother     Cancer Maternal Uncle     Osteoarthritis Paternal Aunt     Stroke Paternal Uncle     Cancer Maternal Grandmother     Basal cell carcinoma Neg Hx     Melanoma Neg Hx     Squamous cell carcinoma Neg Hx     Glaucoma Neg Hx     Macular degeneration Neg Hx        Social History:  reports that she has been smoking cigarettes. She has a 15 pack-year smoking history. She has never used smokeless tobacco. She reports that she does not drink alcohol and does not use drugs.    Physical Exam  Vitals:    03/20/23 1433   BP: 124/90   BP Site: L Arm   BP Position: Sitting   BP Cuff Size: Large   Pulse: 113   Temp: 36.8 ??C (98.2 ??F)   TempSrc: Temporal   SpO2: 97%   Weight: 64 kg (141 lb 3.2 oz)   Height: 154.9 cm (5' 1)        Physical Exam  Constitutional:       General: She is not in acute distress.     Appearance: She is not toxic-appearing.   Cardiovascular:      Rate and Rhythm: Normal rate.   Pulmonary:      Effort: Pulmonary effort is normal. No respiratory distress.      Breath sounds: No stridor.   Abdominal:      General: There is no distension.      Palpations: Abdomen is soft.      Tenderness: There is no abdominal tenderness. There is no guarding or rebound.   Neurological:      Mental Status: She is alert and oriented to person, place, and time.         Assessment:   Problem List Items Addressed This Visit    None  Visit Diagnoses       Peptic ulcer disease    -  Primary    Relevant Orders    Ambulatory referral to General Surgery          Plan:  1. Peptic ulcer disease  Continue treatment of peptic ulcer disease as per gastroenterology. I strongly recommended smoking cessation. Advised patient to follow-up with PCP if she needs assistance with smoking cessation. No surgical intervention recommended at this time.  Patient states that she goes to [facility] for her rheumatology issues and would like to obtain a second opinion from [facility] about her peptic ulcer disease. Referral to Dr. Leona Carry placed in epic. Follow-up with me as needed. Advised patient to continue follow-up with her gastroenterologist.  - Ambulatory referral to General Surgery; Future      Follow-up:   As needed      Deniece Ree, MD  03/20/2023  3:36 PM

## 2023-03-29 NOTE — Unmapped (Signed)
[facility] Clinical Assessment & Refill Coordination Note    Kim Charles, DOB: Apr 04, 1981  Phone: [phone] (home)     All above HIPAA information was verified with patient.     Was a Nurse, learning disability used for this call? No    Specialty Medication(s):   Inflammatory Disorders: Humira     Current Outpatient Medications   Medication Sig Dispense Refill    albuterol (PROVENTIL HFA;VENTOLIN HFA) 90 mcg/actuation inhaler Inhale 2 puffs every six (6) hours as needed. 18 g 3    amlodipine (NORVASC) 10 MG tablet Take 1 tablet (10 mg total) by mouth daily. 90 tablet 1    aspirin-acetaminophen-caffeine (EXCEDRIN MIGRAINE) 250-250-65 mg per tablet Take 2 tablets by mouth. (Patient not taking: Reported on 03/05/2023)      beclomethasone (QVAR) 40 mcg/actuation inhaler Inhale 2 puffs daily as needed.      benzonatate (TESSALON) 200 MG capsule Take 1 capsule (200 mg total) by mouth Three (3) times a day as needed for cough. (Patient not taking: Reported on 03/20/2023) 20 capsule 0    cloNIDine HCL (CATAPRES) 0.1 MG tablet Take 1 tablet (0.1 mg total) by mouth two (2) times a day. 180 tablet 1    cyclobenzaprine (FLEXERIL) 10 MG tablet Take 1 tablet (10 mg total) by mouth two (2) times a day as needed for muscle spasms. (Patient not taking: Reported on 03/05/2023) 20 tablet 0    dexlansoprazole (DEXILANT) 60 mg capsule Take 1 capsule (60 mg total) by mouth daily. 30 capsule 5    diclofenac sodium (VOLTAREN) 1 % gel Apply 2 g topically four (4) times a day. 100 g 0    dicyclomine (BENTYL) 20 mg tablet Take 1 tablet (20 mg total) by mouth two (2) times a day. 180 tablet 1    doxycycline (VIBRAMYCIN) 100 MG capsule Take 1 capsule (100 mg total) by mouth two (2) times a day. (Patient not taking: Reported on 03/05/2023) 14 capsule 0    empty container (SHARPS CONTAINER) Misc Use as directed (Patient not taking: Reported on 03/20/2023) 1 each 2    empty container Misc Use as directed (Patient not taking: Reported on 03/20/2023) 1 each 0    empty container Misc Use as directed (Patient not taking: Reported on 03/20/2023) 1 each 4    EPINEPHrine (EPIPEN) 0.3 mg/0.3 mL injection Inject 0.3 mL (0.3 mg total) into the muscle. (Patient not taking: Reported on 03/20/2023)      escitalopram oxalate (LEXAPRO) 10 MG tablet Take 1 tablet (10 mg total) by mouth daily.  3    famotidine (PEPCID) 20 MG tablet Take 1 tablet (20 mg total) by mouth two (2) times a day for 15 days. 30 tablet 0    fluticasone propionate (FLOVENT HFA) 220 mcg/actuation inhaler 2 puffs.      gabapentin (NEURONTIN) 600 MG tablet Take 1 tablet (600 mg total) by mouth Three (3) times a day. 270 tablet 3    HUMIRA PEN CITRATE FREE 40 MG/0.4 ML Inject the contents of 1 pen (40 mg total) under the skin every seven (7) days. 4 each 4    hydroCHLOROthiazide (HYDRODIURIL) 50 MG tablet Take 1 tablet (50 mg total) by mouth daily. 90 tablet 0    HYDROcodone-acetaminophen (NORCO 10-325) 10-325 mg per tablet Take 1 tablet by mouth 5 times daily as needed for pain      hydroxychloroquine (PLAQUENIL) 200 mg tablet Take 2 tablets (400 mg total) by mouth daily. 180 tablet 3  HYSINGLA ER 20 mg TP24 Take 1 tablet by mouth every 24 hours      ipratropium (ATROVENT) 0.03 % nasal spray 2 sprays into each nostril. (Patient not taking: Reported on 03/20/2023)      lisinopril (PRINIVIL,ZESTRIL) 20 MG tablet Take 1 tablet (20 mg total) by mouth daily. 90 tablet 1    metFORMIN (GLUCOPHAGE) 500 MG tablet TAKE 1 TABLET BY MOUTH 2 TIMES DAILY WITH MEALS 180 tablet 3    MITIGARE 0.6 mg cap capsule Take 1 capsule (0.6 mg total) by mouth daily.      montelukast (SINGULAIR) 10 mg tablet Take 1 tablet (10 mg total) by mouth nightly. 90 tablet 1    NARCAN 4 mg/actuation nasal spray spray (4 mg) in 1 nostril may repeat dose every 2-3 minutes as needed alternating nostrils with each dose (Patient not taking: Reported on 03/20/2023)      ondansetron (ZOFRAN-ODT) 4 MG disintegrating tablet Take 1 tablet (4 mg total) by mouth every eight (8) hours as needed for nausea for up to 7 doses. 7 tablet 0    potassium chloride 10 MEQ ER tablet       promethazine (PHENERGAN) 25 MG tablet Take 1 tablet (25 mg total) by mouth every six (6) hours as needed for nausea. 30 tablet 1    propranolol (INDERAL) 40 MG tablet Take 1 tablet (40 mg total) by mouth two (2) times a day. 180 tablet 1    rifAMPin (RIFADIN) 300 MG capsule Take 1 capsule (300 mg total) by mouth two (2) times a day. 60 capsule 2    spironolactone (ALDACTONE) 25 MG tablet Take 1 tablet (25 mg total) by mouth daily. 90 tablet 1    syringe with needle (BD TUBERCULIN SYRINGE) 1 mL 25 gauge x 5/8 Syrg Use 1 syringe once a week for methotrexate injections. (Patient not taking: Reported on 03/20/2023) 50 each 7    topiramate (TOPAMAX) 50 MG tablet TAKE 1 TABLET BY MOUTH 2 TIMES DAILY  5    traZODone (DESYREL) 50 MG tablet Take 1 tablet (50 mg total) by mouth.      VYVANSE 30 mg capsule Take 1 capsule (30 mg total) by mouth two (2) times a day.  0     No current facility-administered medications for this visit.        Changes to medications: Kim Charles reports no changes at this time.    Allergies   Allergen Reactions    Other Hives, Itching and Other (See Comments)     High fever. Hives and itching all over the body.    Shellfish Containing Products Anaphylaxis    Sulfasalazine Hives    Toradol [Ketorolac] Hives    Venom-Honey Bee Anaphylaxis    Opioids - Morphine Analogues      Other reaction(s): Other  Other reaction(s): Other (See Comments)  Causes migraine  Headache    Tramadol Nausea And Vomiting    Hydromorphone Hcl      migraine    Naproxen      Grits teeth       Changes to allergies: No    SPECIALTY MEDICATION ADHERENCE     Humira 40  mg/0.8mL : 0 days of medicine on hand     Medication Adherence    Patient reported X missed doses in the last month: >5  Specialty Medication: Humira 40 mg/0.92mL Q7d  Patient is on additional specialty medications: No  Patient is on more than two specialty medications: No  Any gaps  in refill history greater than 2 weeks in the last 3 months: no  Demonstrates understanding of importance of adherence: yes  Informant: patient          Specialty medication(s) dose(s) confirmed: Regimen is correct and unchanged.     Are there any concerns with adherence? Yes: Kim Charles reports missing multiple doses of Humira due to being hospitalized 6 times within the last few months for worsening adrenal insufficiency (lost 60 pounds) and infection treatment (COVID/pneumonia)    Adherence counseling provided? Not needed     CLINICAL MANAGEMENT AND INTERVENTION      Clinical Benefit Assessment:    Do you feel the medicine is effective or helping your condition? Yes    Clinical Benefit counseling provided? Not needed    Adverse Effects Assessment:    Are you experiencing any side effects? No    Are you experiencing difficulty administering your medicine? No    Quality of Life Assessment:    Quality of Life    Rheumatology  Oncology  Dermatology  Cystic Fibrosis          How many days over the past month did your HS  keep you from your normal activities? For example, brushing your teeth or getting up in the morning. Kim Charles reports missing multiple doses of Humira due to being hospitalized 6 times within the last few months for worsening adrenal insufficiency (lost 60 pounds) and infection treatment (COVID/pneumonia). She now requires a walker to help get around.     Have you discussed this with your provider?  Kim Charles agreed to call dermatologist to get an appt scheduled for follow-up    Acute Infection Status:    Acute infections noted within Epic:  No active infections  Patient reported infection: None    Therapy Appropriateness:    Is therapy appropriate and patient progressing towards therapeutic goals? Yes, therapy is appropriate and should be continued    DISEASE/MEDICATION-SPECIFIC INFORMATION      For patients on injectable medications: Patient currently has 0 doses left.  Next injection is scheduled for 04/08/23.    Chronic Inflammatory Diseases: Have you experienced any flares in the last month? Yes, last flare was a month ago  Has this been reported to your provider? No - She did not contact provider since lesion drained on its own. But she feels another once coming soon and agreed to call dermatologist to get an appt scheduled for follow-up.    PATIENT SPECIFIC NEEDS     Does the patient have any physical, cognitive, or cultural barriers? No    Is the patient high risk? No    Did the patient require a clinical intervention? No    Does the patient require physician intervention or other additional services (i.e., nutrition, smoking cessation, social work)? No    SOCIAL DETERMINANTS OF HEALTH     At the [facility], we have learned that life circumstances - like trouble affording food, housing, utilities, or transportation can affect the health of many of our patients.   That is why we wanted to ask: are you currently experiencing any life circumstances that are negatively impacting your health and/or quality of life? Patient declined to answer    Social Determinants of Health     Financial Resource Strain: Not on file   Internet Connectivity: Not on file   Food Insecurity: Not on file   Tobacco Use: High Risk (03/20/2023)    Patient History     Smoking  Tobacco Use: Every Day     Smokeless Tobacco Use: Never     Passive Exposure: Not on file   Housing/Utilities: Not on file   Alcohol Use: Not on file   Transportation Needs: No Transportation Needs (10/26/2022)    Received from [facility]    PRAPARE - Transportation     Lack of Transportation (Medical): No     Lack of Transportation (Non-Medical): No   Substance Use: Not on file   Health Literacy: Not on file   Physical Activity: Not on file   Interpersonal Safety: Not on file   Stress: Not on file   Intimate Partner Violence: Not on file   Depression: Not at risk (10/26/2022)    Received from [facility]    PHQ-2     Patient Health Questionnaire-2 Score: 0   Social Connections: Not on file       Would you be willing to receive help with any of the needs that you have identified today? Not applicable       SHIPPING     Specialty Medication(s) to be Shipped:   Inflammatory Disorders: Humira    Other medication(s) to be shipped:  hydroxychloroquine 200mg  and promethazine 25mg      Changes to insurance: No    Patient was informed of new phone menu: No    Delivery Scheduled: Yes, Expected medication delivery date: 04/03/23.     Medication will be delivered via UPS to the confirmed prescription address in [facility].    The patient will receive a drug information handout for each medication shipped and additional FDA Medication Guides as required.  Verified that patient has previously received a Conservation officer, historic buildings and a Surveyor, mining.    The patient or caregiver noted above participated in the development of this care plan and knows that they can request review of or adjustments to the care plan at any time.      All of the patient's questions and concerns have been addressed.    Oliva Bustard, PharmD   [facility] Specialty Pharmacist

## 2023-04-01 DIAGNOSIS — L732 Hidradenitis suppurativa: Principal | ICD-10-CM

## 2023-04-02 NOTE — Unmapped (Signed)
Kim Charles 's HUMIRA(CF) PEN 40 mg/0.4 mL injection (adalimumab) shipment will be delayed as a result of prior authorization being required by the patient's insurance.     I have reached out to the patient  at [phone] and left a voicemail message.  We will call the patient back to reschedule the delivery upon resolution. We have not confirmed the new delivery date.

## 2023-04-04 MED FILL — HYDROXYCHLOROQUINE 200 MG TABLET: ORAL | 90 days supply | Qty: 180 | Fill #2

## 2023-04-04 MED FILL — HUMIRA PEN CITRATE FREE 40 MG/0.4 ML: SUBCUTANEOUS | 28 days supply | Qty: 4 | Fill #3

## 2023-04-04 MED FILL — PROMETHAZINE 25 MG TABLET: ORAL | 8 days supply | Qty: 30 | Fill #1

## 2023-04-04 NOTE — Unmapped (Signed)
Murray Hodgkins 's Humira shipment will be sent out  as a result of prior authorization now approved.     I have reached out to the patient  at [phone] and communicated the delivery change. We will reschedule the medication for the delivery date that the patient agreed upon.  We have confirmed the delivery date as 04/05/23, via ups.

## 2023-04-08 ENCOUNTER — Ambulatory Visit: Admit: 2023-04-08 | Discharge: 2023-04-09 | Payer: PRIVATE HEALTH INSURANCE | Attending: Neurology | Primary: Neurology

## 2023-04-08 DIAGNOSIS — R269 Unspecified abnormalities of gait and mobility: Principal | ICD-10-CM

## 2023-04-08 DIAGNOSIS — R200 Anesthesia of skin: Principal | ICD-10-CM

## 2023-04-08 NOTE — Unmapped (Signed)
[facility]  Clinical Neurophysiology Laboratory  [place], [place]         Patient: Kim Charles    Date of Birth: May 30, 1981  [facility] #: [id number]   Handedness: Right  Sex:  Female      Visit Date:  04/08/2023 2:48 PM  Age:   42 Years  Attending:  Jamelle Rushing, MD   Req Provider: Krystal Clark, MD  EMG Tech:  Francee Gentile, CNCT   Current Height: 5 feet 1 inch    [name] is a 42 y.o. woman presenting with two months of left arm and leg numbness and gait imbalance.       Motor NCS      Nerve / Sites Muscle Latency Ref. Amplitude Ref. Dur. Distance Lat Diff Velocity Ref.     ms ms mV mV ms cm ms m/s m/s   L Median - APB      Wrist APB 2.77 ?4.40 13.7 ?4.2 6.19 8         Elbow APB 6.35  13.1  6.77 19 3.58 53.0 ?49.0   R Median - APB      Wrist APB 2.79 ?4.40 11.5 ?4.2 6.33 8         Elbow APB 6.54  11.5  8.38 20.5 3.75 54.7 ?49.0   L Ulnar - ADM      Wrist ADM 2.65 ?3.50 9.7 ?5.6 7.23 8         B.Elbow ADM 5.25  9.4  7.31 14.5 2.60 55.7 ?49.0      A.Elbow ADM 7.56  8.9  7.50 12 2.31 51.9 ?49.0   L COMMON FIBULAR (PERON) - EDB      Ankle EDB 4.15 ?5.60 8.2 ?2.2 5.87 7.5         B. Fib Head EDB 9.77  7.9  6.10 32 5.63 56.9 ?41.0      A. Fib Head EDB 11.58  8.0  6.08 10 1.81 55.2 ?41.0   L Tibial - AH      Ankle AH 4.06 ?5.70 15.7 ?2.8 7.25 7.5         Knee AH 12.19  13.3  7.90 39 8.13 48.0 ?41.0       F  Wave      Nerve F min Ref.    ms ms   L Median - APB 23.2 ?31.0   L Ulnar - ADM 23.5 ?31.0   R Median - APB 23.3 ?31.0   L COMMON FIBULAR (PERON) - EDB 45.6 ?56.0   L Tibial - AH 45.1 ?56.0       Sensory NCS      Nerve / Sites Rec. Site Peak Lat Ref. PP Amp Ref. Distance Vel.     ms ms ??V ??V cm m/s   L Median - Dig II (Antidromic)      Wrist Index 3.04 ?3.80 60.5 ?10.0 14 59   R Median - Dig II (Antidromic)      Wrist Index 3.31 ?3.80 54.2 ?10.0 14 52   L Ulnar - Dig V (Antidromic)      Wrist Dig V 3.04 ?3.80 73.1 ?10.0 14 57   L Radial - Superficial (Antidromic)      Forearm Wrist 2.25 ?2.80 74.0  10 57   L Sural - (Antidromic)      Calf Ankle 3.77 ?4.20 13.6 ?5.0 14 48   L SUP FIBULAR (PERON) - Ankle      Lat leg Ankle 2.85 ?3.40 16.6 ?  5.0 10 51       EMG Summary Table     Spontaneous MUP Recruitment Comment   Muscle Nerve Roots Fib PSW Fasc Other Amp Dur. PPP Pattern Other   L. Tibialis anterior Deep peroneal (Fibular) L4-L5 None None None None N N N Normal None   L. Gastrocnemius (Medial head) Tibial S1-S2 None None None None N N N Normal None   L. Vastus lateralis Femoral L2-L4 None None None None N N N Normal None   L. First dorsal interosseous Ulnar C8-T1 None None None None N N N Normal None   L. Biceps brachii Musculocutaneous C5-C6 None None None None N N N Normal None   L. Triceps brachii Radial C6-C8 None None None None N N N Normal None       Summary:  After identifying the patient in the waiting room and reviewing all appropriately available medical records, the patient was taken back to the examination room where the procedure was explained, the sites of examination were noted, the patient's questions were answered, and the patient's verbal consent for the procedure was obtained. Studies were performed at [place] using a radiant warmer and [employer] Synergy EMG system.    Motor nerve conduction studies of the left ulnar, fibular, tibial and bilateral median nerves show normal distal motor latency, CMAP amplitude and conduction velocity. Minimal F-wave latencies are normal in all tested nerves.    Sensory nerve conduction studies of the left ulnar, radial, sural, superficial fibular and bilateral median nerves show normal peak latency and SNAP amplitude.    Needle EMG of selected muscles in the left arm and leg show no abnormal spontaneous activity, normal MUP form and normal recruitment.     Conclusions:  Normal study. There is no electrodiagnostic evidence of large fiber polyneuropathy or entrapment neuropathy in the left leg or either arm.    As the attending physician, my signature affirms that I personally participated in the clinical assessment of this patient, performed or reviewed all electrodiagnostic procedures, and prepared or reviewed the conclusions of this report.    - - - - - - - - - - - -  Attending Electromyographer  Jamelle Rushing, MD  Associate Professor  Department of Neurology  [facility]

## 2023-04-08 NOTE — Unmapped (Signed)
04/08/23    Seen today for EMG for left sided numbness - normal  She reports acute onset left arm and leg sensory loss, gait imbalance and bowel incontinence that has persistent since but not progressed    Exam with extensor plantar response on the left and asymmetric left sided hyperreflexia    We discussed urgent outpatient MRI brain and cspine vs emergency dept - since not progressing will do outpatient    Follow up with her rheumatologist and primary care    Jamelle Rushing, MD  Associate Professor  Department of Neurology  [place] [facility]

## 2023-04-18 ENCOUNTER — Ambulatory Visit: Admit: 2023-04-18 | Discharge: 2023-04-18 | Payer: PRIVATE HEALTH INSURANCE

## 2023-04-18 DIAGNOSIS — L732 Hidradenitis suppurativa: Principal | ICD-10-CM

## 2023-04-18 DIAGNOSIS — Z79899 Other long term (current) drug therapy: Principal | ICD-10-CM

## 2023-04-18 DIAGNOSIS — M359 Systemic involvement of connective tissue, unspecified: Principal | ICD-10-CM

## 2023-04-18 LAB — CBC W/ AUTO DIFF
BASOPHILS ABSOLUTE COUNT: 0.1 10*9/L (ref 0.0–0.1)
BASOPHILS RELATIVE PERCENT: 1.7 %
EOSINOPHILS ABSOLUTE COUNT: 0.2 10*9/L (ref 0.0–0.5)
EOSINOPHILS RELATIVE PERCENT: 2.4 %
HEMATOCRIT: 41.6 % (ref 34.0–44.0)
HEMOGLOBIN: 13.3 g/dL (ref 11.3–14.9)
LYMPHOCYTES ABSOLUTE COUNT: 2.9 10*9/L (ref 1.1–3.6)
LYMPHOCYTES RELATIVE PERCENT: 34.9 %
MEAN CORPUSCULAR HEMOGLOBIN CONC: 32 g/dL (ref 32.0–36.0)
MEAN CORPUSCULAR HEMOGLOBIN: 25.4 pg — ABNORMAL LOW (ref 25.9–32.4)
MEAN CORPUSCULAR VOLUME: 79.3 fL (ref 77.6–95.7)
MEAN PLATELET VOLUME: 8.5 fL (ref 6.8–10.7)
MONOCYTES ABSOLUTE COUNT: 0.5 10*9/L (ref 0.3–0.8)
MONOCYTES RELATIVE PERCENT: 6.4 %
NEUTROPHILS ABSOLUTE COUNT: 4.6 10*9/L (ref 1.8–7.8)
NEUTROPHILS RELATIVE PERCENT: 54.6 %
PLATELET COUNT: 400 10*9/L (ref 150–450)
RED BLOOD CELL COUNT: 5.25 10*12/L — ABNORMAL HIGH (ref 3.95–5.13)
RED CELL DISTRIBUTION WIDTH: 15.7 % — ABNORMAL HIGH (ref 12.2–15.2)
WBC ADJUSTED: 8.4 10*9/L (ref 3.6–11.2)

## 2023-04-18 LAB — URINALYSIS WITH MICROSCOPY WITH CULTURE REFLEX
BACTERIA: NONE SEEN /HPF
BILIRUBIN UA: NEGATIVE
BLOOD UA: NEGATIVE
GLUCOSE UA: NEGATIVE
KETONES UA: NEGATIVE
NITRITE UA: POSITIVE — AB
PH UA: 6 (ref 5.0–9.0)
PROTEIN UA: 100 — AB
RBC UA: <1 /HPF (ref <=4)
SPECIFIC GRAVITY UA: 1.029 (ref 1.003–1.030)
SQUAMOUS EPITHELIAL: <1 /HPF (ref 0–5)
UROBILINOGEN UA: <2
WBC UA: <1 /HPF (ref 0–5)

## 2023-04-18 LAB — C4 COMPLEMENT: C4 COMPLEMENT: 16.4 mg/dL (ref 12.0–36.0)

## 2023-04-18 LAB — ALT: ALT (SGPT): <7 U/L — ABNORMAL LOW (ref 10–49)

## 2023-04-18 LAB — PROTEIN / CREATININE RATIO, URINE
CREATININE, URINE: 350.6 mg/dL
PROTEIN URINE: 131.3 mg/dL
PROTEIN/CREAT RATIO, URINE: 0.375

## 2023-04-18 LAB — CREATININE
CREATININE: 0.55 mg/dL
EGFR CKD-EPI (2021) FEMALE: >90 mL/min/{1.73_m2} (ref >=60)

## 2023-04-18 LAB — AST: AST (SGOT): <8 U/L (ref <=34)

## 2023-04-18 LAB — C3 COMPLEMENT: C3 COMPLEMENT: 109 mg/dL (ref 90–170)

## 2023-04-18 MED ORDER — HYDROXYCHLOROQUINE 200 MG TABLET
ORAL_TABLET | Freq: Every day | ORAL | 3 refills | 90 days | Status: CP
Start: 2023-04-18 — End: ?

## 2023-04-18 NOTE — Unmapped (Addendum)
You are overdue for eye exam and need for screening for hydroxychloroquine (plaquenil). Please arrange and complete eye exam. If not by next visit, will need to discontinue medication.     No signs for lupus. You probable Sjogren's with the dry eyes and dry mouth symptoms although exam shows good salivary pooling and moist eyes.    Continue hydroxychloroquine.     Will get labs.
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
## 2023-04-23 ENCOUNTER — Encounter: Admit: 2023-04-23 | Discharge: 2023-04-23 | Payer: PRIVATE HEALTH INSURANCE

## 2023-04-23 LAB — ANTI-DNA ANTIBODY, DOUBLE-STRANDED: DSDNA ANTIBODY: NEGATIVE

## 2023-04-29 NOTE — Unmapped (Signed)
[facility] Refill Coordination Note    Specialty Medication(s) to be Shipped:   Inflammatory Disorders: Humira    Other medication(s) to be shipped: No additional medications requested for fill at this time     Kim Charles, DOB: February 17, 1981  Phone: [phone] (home)       All above HIPAA information was verified with patient.     Was a Nurse, learning disability used for this call? No    Completed refill call assessment today to schedule patient's medication shipment from the [facility] [phone]).  All relevant notes have been reviewed.     Specialty medication(s) and dose(s) confirmed: Regimen is correct and unchanged.   Changes to medications: Cheyne reports no changes at this time.  Changes to insurance: No  New side effects reported not previously addressed with a pharmacist or physician: None reported  Questions for the pharmacist: No    Confirmed patient received a Conservation officer, historic buildings and a Surveyor, mining with first shipment. The patient will receive a drug information handout for each medication shipped and additional FDA Medication Guides as required.       DISEASE/MEDICATION-SPECIFIC INFORMATION        For patients on injectable medications: Patient currently has 1 doses left.  Next injection is scheduled for 04/30/23.    SPECIALTY MEDICATION ADHERENCE     Medication Adherence    Specialty Medication: HUMIRA PEN CITRATE FREE 40 MG/0.4 ML  Patient is on additional specialty medications: No  Informant: patient              Were doses missed due to medication being on hold? No    Humira  40 mg/ml: 8 days of medicine on hand       REFERRAL TO PHARMACIST     Referral to the pharmacist: Not needed      [facility]     Shipping address confirmed in Epic.       Delivery Scheduled: Yes, Expected medication delivery date: 05/02/23.     Medication will be delivered via UPS to the prescription address in Epic WAM.    Sherral Hammers, PharmD   [facility] Specialty Pharmacist

## 2023-05-01 MED FILL — HUMIRA PEN CITRATE FREE 40 MG/0.4 ML: SUBCUTANEOUS | 28 days supply | Qty: 4 | Fill #4

## 2023-05-21 DIAGNOSIS — L732 Hidradenitis suppurativa: Principal | ICD-10-CM

## 2023-05-21 MED ORDER — HUMIRA PEN CITRATE FREE 40 MG/0.4 ML
SUBCUTANEOUS | 4 refills | 28 days
Start: 2023-05-21 — End: ?

## 2023-05-22 MED ORDER — HUMIRA PEN CITRATE FREE 40 MG/0.4 ML
SUBCUTANEOUS | 4 refills | 28.00000 days | Status: CP
Start: 2023-05-22 — End: ?
  Filled 2023-06-05: qty 4, 28d supply, fill #0

## 2023-06-04 NOTE — Unmapped (Signed)
[facility] Refill Coordination Note    Specialty Medication(s) to be Shipped:   Inflammatory Disorders: Humira    Other medication(s) to be shipped: No additional medications requested for fill at this time     Kim Charles, DOB: 01/30/1981  Phone: [phone] (home)       All above HIPAA information was verified with patient.     Was a Nurse, learning disability used for this call? No    Completed refill call assessment today to schedule patient's medication shipment from the [facility] [phone]).  All relevant notes have been reviewed.     Specialty medication(s) and dose(s) confirmed: Regimen is correct and unchanged.   Changes to medications: [name] reports starting the following medications: for adrenal glands  Changes to insurance: No  New side effects reported not previously addressed with a pharmacist or physician: None reported  Questions for the pharmacist: No    Confirmed patient received a Conservation officer, historic buildings and a Surveyor, mining with first shipment. The patient will receive a drug information handout for each medication shipped and additional FDA Medication Guides as required.       DISEASE/MEDICATION-SPECIFIC INFORMATION        For patients on injectable medications: Patient currently has 0 doses left.  Next injection is scheduled for 06/04/23.    SPECIALTY MEDICATION ADHERENCE     Medication Adherence    Specialty Medication: HUMIRA PEN CITRATE FREE 40 MG/0.4 ML              Were doses missed due to medication being on hold? No    Humira  40 mg/ml: 0 days of medicine on hand       REFERRAL TO PHARMACIST     Referral to the pharmacist: Not needed      [facility]     Shipping address confirmed in Epic.       Delivery Scheduled: Yes, Expected medication delivery date: 06/06/23.     Medication will be delivered via UPS to the prescription address in Epic WAM.    Gaspar Cola [facility] Specialty Pharmacist

## 2023-08-18 ENCOUNTER — Encounter (HOSPITAL_COMMUNITY): Payer: Self-pay

## 2023-08-18 ENCOUNTER — Emergency Department (HOSPITAL_COMMUNITY)
Admission: EM | Admit: 2023-08-18 | Discharge: 2023-08-18 | Disposition: A | Discharge status: Home / Self Care | Payer: Medicaid Other | Attending: Emergency Medicine | Admitting: Emergency Medicine

## 2023-08-18 ENCOUNTER — Other Ambulatory Visit: Payer: Self-pay

## 2023-08-18 ENCOUNTER — Emergency Department (HOSPITAL_COMMUNITY): Payer: Medicaid Other

## 2023-08-18 DIAGNOSIS — R062 Wheezing: Secondary | ICD-10-CM | POA: Diagnosis not present

## 2023-08-18 DIAGNOSIS — I10 Essential (primary) hypertension: Secondary | ICD-10-CM

## 2023-08-18 DIAGNOSIS — Z79899 Other long term (current) drug therapy: Secondary | ICD-10-CM | POA: Insufficient documentation

## 2023-08-18 DIAGNOSIS — Z7982 Long term (current) use of aspirin: Secondary | ICD-10-CM | POA: Insufficient documentation

## 2023-08-18 DIAGNOSIS — R509 Fever, unspecified: Secondary | ICD-10-CM | POA: Diagnosis present

## 2023-08-18 DIAGNOSIS — Z20822 Contact with and (suspected) exposure to covid-19: Secondary | ICD-10-CM | POA: Insufficient documentation

## 2023-08-18 DIAGNOSIS — D72829 Elevated white blood cell count, unspecified: Secondary | ICD-10-CM | POA: Diagnosis not present

## 2023-08-18 DIAGNOSIS — B349 Viral infection, unspecified: Secondary | ICD-10-CM | POA: Diagnosis not present

## 2023-08-18 DIAGNOSIS — Z7984 Long term (current) use of oral hypoglycemic drugs: Secondary | ICD-10-CM | POA: Diagnosis not present

## 2023-08-18 DIAGNOSIS — E876 Hypokalemia: Secondary | ICD-10-CM | POA: Insufficient documentation

## 2023-08-18 LAB — COMPREHENSIVE METABOLIC PANEL
ALT: 6 U/L (ref 0–44)
AST: 9 U/L — ABNORMAL LOW (ref 15–41)
Albumin: 3.3 g/dL — ABNORMAL LOW (ref 3.5–5.0)
Alkaline Phosphatase: 75 U/L (ref 38–126)
Anion gap: 10 (ref 5–15)
BUN: 7 mg/dL (ref 6–20)
CO2: 28 mmol/L (ref 22–32)
Calcium: 8.4 mg/dL — ABNORMAL LOW (ref 8.9–10.3)
Chloride: 98 mmol/L (ref 98–111)
Creatinine, Ser: 0.61 mg/dL (ref 0.44–1.00)
GFR, Estimated: >60 mL/min (ref >60)
Glucose, Bld: 96 mg/dL (ref 70–99)
Potassium: 2.7 mmol/L — CL (ref 3.5–5.1)
Sodium: 136 mmol/L (ref 135–145)
Total Bilirubin: 1.8 mg/dL — ABNORMAL HIGH (ref 0.3–1.2)
Total Protein: 6.8 g/dL (ref 6.5–8.1)

## 2023-08-18 LAB — CBC WITH DIFFERENTIAL/PLATELET
Abs Immature Granulocytes: 0.06 10*3/uL (ref 0.00–0.07)
Basophils Absolute: 0.1 10*3/uL (ref 0.0–0.1)
Basophils Relative: 1 %
Eosinophils Absolute: 0.1 10*3/uL (ref 0.0–0.5)
Eosinophils Relative: 1 %
HCT: 44.5 % (ref 36.0–46.0)
Hemoglobin: 13.6 g/dL (ref 12.0–15.0)
Immature Granulocytes: 1 %
Lymphocytes Relative: 20 %
Lymphs Abs: 2.2 10*3/uL (ref 0.7–4.0)
MCH: 25.6 pg — ABNORMAL LOW (ref 26.0–34.0)
MCHC: 30.6 g/dL (ref 30.0–36.0)
MCV: 83.8 fL (ref 80.0–100.0)
Monocytes Absolute: 0.6 10*3/uL (ref 0.1–1.0)
Monocytes Relative: 6 %
Neutro Abs: 8 10*3/uL — ABNORMAL HIGH (ref 1.7–7.7)
Neutrophils Relative %: 71 %
Platelets: 342 10*3/uL (ref 150–400)
RBC: 5.31 MIL/uL — ABNORMAL HIGH (ref 3.87–5.11)
RDW: 14.7 % (ref 11.5–15.5)
WBC: 11.1 10*3/uL — ABNORMAL HIGH (ref 4.0–10.5)
nRBC: 0 % (ref 0.0–0.2)

## 2023-08-18 LAB — TROPONIN I (HIGH SENSITIVITY)
Troponin I (High Sensitivity): 6 ng/L (ref <18)
Troponin I (High Sensitivity): 4 ng/L (ref <18)

## 2023-08-18 LAB — LIPASE, BLOOD: Lipase: 15 U/L (ref 11–51)

## 2023-08-18 LAB — SARS CORONAVIRUS 2 BY RT PCR: SARS Coronavirus 2 by RT PCR: NEGATIVE

## 2023-08-18 LAB — BRAIN NATRIURETIC PEPTIDE: B Natriuretic Peptide: 219.7 pg/mL — ABNORMAL HIGH (ref 0.0–100.0)

## 2023-08-18 LAB — LACTIC ACID, PLASMA: Lactic Acid, Venous: 1 mmol/L (ref 0.5–1.9)

## 2023-08-18 LAB — MAGNESIUM: Magnesium: 1.8 mg/dL (ref 1.7–2.4)

## 2023-08-18 MED ORDER — ONDANSETRON 4 MG PO TBDP: 4.0000 mg | ORAL_TABLET | Freq: Three times a day (TID) | ORAL | 0 refills | Status: AC | PRN

## 2023-08-18 MED ORDER — ONDANSETRON HCL 4 MG/2ML IJ SOLN
4.0000 mg | Freq: Once | INTRAMUSCULAR | Status: AC
  Administered 2023-08-18: 4 mg via INTRAVENOUS
  Filled 2023-08-18: qty 2

## 2023-08-18 MED ORDER — POTASSIUM CHLORIDE 10 MEQ/100ML IV SOLN
10.0000 meq | INTRAVENOUS | Status: AC
  Administered 2023-08-18 (×3): 10 meq via INTRAVENOUS
  Filled 2023-08-18 (×3): qty 100

## 2023-08-18 MED ORDER — SODIUM CHLORIDE 0.9 % IV BOLUS
1000.0000 mL | Freq: Once | INTRAVENOUS | Status: AC
  Administered 2023-08-18: 1000 mL via INTRAVENOUS

## 2023-08-18 MED ORDER — METHYLPREDNISOLONE SODIUM SUCC 125 MG IJ SOLR
125.0000 mg | Freq: Once | INTRAMUSCULAR | Status: AC
  Administered 2023-08-18: 125 mg via INTRAVENOUS
  Filled 2023-08-18: qty 2

## 2023-08-18 MED ORDER — IPRATROPIUM-ALBUTEROL 0.5-2.5 (3) MG/3ML IN SOLN
3.0000 mL | Freq: Once | RESPIRATORY_TRACT | Status: AC
  Administered 2023-08-18: 3 mL via RESPIRATORY_TRACT
  Filled 2023-08-18: qty 3

## 2023-08-18 MED ORDER — CLONIDINE HCL 0.1 MG PO TABS
0.1000 mg | ORAL_TABLET | Freq: Once | ORAL | Status: AC
  Administered 2023-08-18: 0.1 mg via ORAL
  Filled 2023-08-18: qty 1
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
## 2023-08-18 NOTE — Discharge Instructions (Addendum)
As we discussed, your potassium is a little low today.  This is likely due to the nausea and vomiting you have been having.  This is likely due to a viral illness.  I have given you a prescription for Zofran for you to take as prescribed as needed for any residual nausea or vomiting.  Your blood pressure was also a little high and we have given you another dose of your medication.  Please keep a close watch on your blood pressure readings at home and follow-up closely with your primary doctor for continued evaluation and management as well as a recheck of your labs to ensure that your potassium has normalized.  Return if development of any new or worsening symptoms.

## 2023-08-18 NOTE — ED Notes (Signed)
Date and time results received: 08/18/23 12:57 PM   (use smartphrase ".now" to insert current time)  Test: K=  Critical Value: 2.7  Name of Provider Notified: Dr. Jacqulyn Bath

## 2023-08-18 NOTE — ED Provider Notes (Signed)
Care assumed from [facility], PA-C at shift change. Please see their note for further evaluation.   Briefly: Patient with headache, chills, abdominal pain, nausea, vomiting. Son has COVID who the patient has been around.   Plan: Labs reassuring, COVID negative, lactic WNL. Potassium 2.7, repleated via IV. Did have some wheezing originally, given nebs as well. Plan for delta trop and mag to result. Previous provider suspects symptoms likely viral. Patient was also mildly hypertensive, given dose of her htn meds here today. After labs result, if WNL suspect patient will be able to go home.  6:30PM: Patients delta troponin and magnesium WNL.  Upon reassessment after meds, patient states she is feeling much better and ready to go home per previous providers recommendations.  Will give zofran for nausea/vomiting. Evaluation and diagnostic testing in the emergency department does not suggest an emergent condition requiring admission or immediate intervention beyond what has been performed at this time.  Plan for discharge with close PCP follow-up.  Patient is understanding and amenable with plan, educated on red flag symptoms that would prompt immediate return.  Patient discharged in stable condition.    Vear Clock 08/18/23 1837    Wynetta Fines, MD 08/19/23 [phone]

## 2023-08-18 NOTE — ED Triage Notes (Signed)
BIBA for flu like symptoms that started lastnight. Exposed to COVID. Has been taking Tylenol, no relief. PA at bedside

## 2023-08-23 LAB — CULTURE, BLOOD (ROUTINE X 2)
Culture: NO GROWTH
Culture: NO GROWTH
Special Requests: ADEQUATE
Special Requests: ADEQUATE

## 2023-08-29 ENCOUNTER — Encounter: Admit: 2023-08-29 | Discharge: 2023-08-29 | Payer: PRIVATE HEALTH INSURANCE | Attending: Family | Primary: Family

## 2023-09-17 ENCOUNTER — Ambulatory Visit
Admit: 2023-09-17 | Discharge: 2023-09-18 | Disposition: A | Discharge status: Home / Self Care | Payer: PRIVATE HEALTH INSURANCE | Attending: Emergency Medicine

## 2023-09-17 LAB — COMPREHENSIVE METABOLIC PANEL
ALBUMIN/GLOBULIN RATIO: 1.2
ALBUMIN: 3.3 g/dL (ref 3.1–4.7)
ALKALINE PHOSPHATASE: 71 U/L (ref 33–120)
ALT (SGPT): <3 U/L — ABNORMAL LOW
ANION GAP: 8 mmol/L (ref 5–16)
AST (SGOT): 6 U/L — ABNORMAL LOW (ref 10–40)
BILIRUBIN TOTAL: 1.2 mg/dL (ref 0.2–1.2)
BLOOD UREA NITROGEN: 6 mg/dL — ABNORMAL LOW (ref 8–21)
BUN / CREAT RATIO: 9 (ref 6–20)
CALCIUM: 8.6 mg/dL (ref 8.5–10.4)
CHLORIDE: 94 mmol/L — ABNORMAL LOW (ref 98–110)
CO2: 38 mmol/L — ABNORMAL HIGH (ref 24.0–31.0)
CREATININE: 0.7 mg/dL (ref 0.50–1.40)
EGFR CKD-EPI (2021) FEMALE: >90 mL/min/{1.73_m2} (ref >=60)
GLOBULIN, TOTAL: 2.7 g/dL
GLUCOSE RANDOM: 105 mg/dL (ref 75–110)
OSMOLALITY CALCULATED: 277 mosm/kg
POTASSIUM: 2.7 mmol/L — ABNORMAL LOW (ref 3.5–5.3)
PROTEIN TOTAL: 6 g/dL (ref 6.0–8.0)
SODIUM: 140 mmol/L (ref 135–153)

## 2023-09-17 LAB — DRUG SCREEN, URINE
AMPHETAMINE SCREEN URINE: NEGATIVE
BARBITURATE SCREEN URINE: NEGATIVE
BENZODIAZEPINE SCREEN, URINE: NEGATIVE
CANNABINOID SCREEN URINE: NEGATIVE
COCAINE(METAB.)SCREEN, URINE: NEGATIVE
FENTANYL SCREEN, URINE: NEGATIVE
METHADONE METABOLITE: NEGATIVE
METHAQUALONE SCREEN URINE: NEGATIVE
OPIATE SCREEN URINE: POSITIVE — AB
OXYCODONE SCREEN URINE: POSITIVE — AB
PCP SCREEN, URINE: NEGATIVE

## 2023-09-17 LAB — URINALYSIS WITH MICROSCOPY
BACTERIA: NONE SEEN /HPF
BILIRUBIN UA: NEGATIVE
BLOOD UA: NEGATIVE
GLUCOSE UA: NEGATIVE
KETONES UA: NEGATIVE
LEUKOCYTE ESTERASE UA: NEGATIVE
NITRITE UA: NEGATIVE
PH UA: 7.5 (ref 5.0–9.0)
RBC UA: 1 /HPF (ref 0–2)
SPECIFIC GRAVITY UA: 1.013 — ABNORMAL LOW (ref 1.016–1.022)
SQUAMOUS EPITHELIAL: 5 /HPF — ABNORMAL HIGH (ref 0–4)
WBC UA: 3 /HPF (ref 0–4)

## 2023-09-17 LAB — CBC W/ AUTO DIFF
BASOPHILS ABSOLUTE COUNT: 0.1 10*9/L (ref 0.0–0.4)
BASOPHILS RELATIVE PERCENT: 0.5 %
EOSINOPHILS ABSOLUTE COUNT: 0.1 10*9/L (ref 0.0–0.7)
EOSINOPHILS RELATIVE PERCENT: 0.9 %
HEMATOCRIT: 41.3 %
HEMOGLOBIN: 13.4 g/dL
IMMATURE GRANULOCYTES ABSOLUTE COUNT: 0.1 10*9/L (ref 0.0–0.6)
IMMATURE GRANULOCYTES RELATIVE PERCENT: 0.4 %
LYMPHOCYTES ABSOLUTE COUNT: 2.6 10*9/L (ref 1.2–3.4)
LYMPHOCYTES RELATIVE PERCENT: 22.4 %
MEAN CORPUSCULAR HEMOGLOBIN CONC: 32.4 g/dL (ref 31.0–35.5)
MEAN CORPUSCULAR HEMOGLOBIN: 26.6 pg (ref 26.0–34.0)
MEAN CORPUSCULAR VOLUME: 81.9 fL
MEAN PLATELET VOLUME: 11.2 fL — ABNORMAL HIGH (ref 7.0–10.0)
MONOCYTES ABSOLUTE COUNT: 0.8 10*9/L — ABNORMAL HIGH (ref 0.1–0.5)
MONOCYTES RELATIVE PERCENT: 7.1 %
NEUTROPHILS ABSOLUTE COUNT: 8.1 10*9/L — ABNORMAL HIGH (ref 1.4–6.5)
NEUTROPHILS RELATIVE PERCENT: 68.7 %
PLATELET COUNT: 328 10*9/L (ref 130–400)
RED BLOOD CELL COUNT: 5.04 10*12/L
RED CELL DISTRIBUTION WIDTH: 15.1 % (ref 11.5–15.5)
WBC ADJUSTED: 11.8 10*9/L — ABNORMAL HIGH (ref 4.8–10.8)

## 2023-09-17 LAB — HIGH SENSITIVITY TROPONIN I - SINGLE: HIGH SENSITIVITY TROPONIN I: 6 ng/L (ref <=37)

## 2023-09-17 LAB — LIPASE: LIPASE: 3 U/L — ABNORMAL LOW (ref 11–82)

## 2023-09-18 MED ORDER — PROMETHAZINE 25 MG TABLET
ORAL_TABLET | Freq: Four times a day (QID) | ORAL | 1 refills | 8 days | Status: CP | PRN
Start: 2023-09-18 — End: ?

## 2023-09-18 MED ADMIN — sodium chloride 0.9% (NS) bolus 1,000 mL: 1000 mL | INTRAVENOUS | @ 01:00:00 | Stop: 2023-09-17

## 2023-09-18 MED ADMIN — potassium chloride 10 mEq in 100 mL IVPB: 10 meq | INTRAVENOUS | @ 01:00:00 | Stop: 2023-09-17

## 2023-09-18 MED ADMIN — droperidol (INAPSINE) injection 1.25 mg: 1.25 mg | INTRAVENOUS | @ 05:00:00 | Stop: 2023-09-18

## 2023-09-18 MED ADMIN — potassium chloride oral solution: 40 meq | ORAL | @ 01:00:00 | Stop: 2023-09-17

## 2023-09-18 MED ADMIN — potassium chloride 10 mEq in 100 mL IVPB: 10 meq | INTRAVENOUS | @ 02:00:00 | Stop: 2023-09-17

## 2023-09-18 NOTE — Unmapped (Shared)
 EMERGENCY MEDICINE    Chief Complaint   Patient presents with    Emesis     Patient reports vomiting x yesterday.        HISTORY OF PRESENT ILLNESS:    Emesis  Associated symptoms: abdominal pain    Associated symptoms: no chills, no cough, no diarrhea, no fever, no headaches and no sore throat      Kim Charles is a 42 y.o. female who presents to the ED via car with a Pmhx of hypertension, Leukemia. Lupus, 2 cesarean sections, and a partial hysterectomy with complaint of emesis and LUQ abdominal pain. Per patient, she has a bleeding ulcer located on the left side of her abdomen. She adds that she has also been experiencing nausea and vomiting that started yesterday. The patient denies chest pain and mentions that she takes a medication everyday but is unable to specify what kind. The patient is alert and oriented and has no other complaints at this time.     REVIEW OF SYSTEMS:  Review of Systems   Constitutional:  Negative for appetite change, chills, diaphoresis and fever.   HENT:  Negative for congestion, sneezing and sore throat.    Eyes:  Negative for visual disturbance.   Respiratory:  Negative for cough, chest tightness and shortness of breath.    Cardiovascular:  Negative for chest pain.   Gastrointestinal:  Positive for abdominal pain, nausea and vomiting. Negative for constipation and diarrhea.   Genitourinary:  Negative for difficulty urinating, dysuria and hematuria.   Musculoskeletal:  Negative for back pain.   Neurological:  Negative for dizziness, syncope, light-headedness, numbness and headaches.       PAST HISTORY:   Past Medical History:   Diagnosis Date    ADHD (attention deficit hyperactivity disorder)     Allergic 2007    Allergic to shrimp    Anemia     Asthma     Chronic pain     Disorder of skin or subcutaneous tissue 2001    Hidradenitis suprative    Fibromyalgia     Hidradenitis suppurativa     Hypertension     Leukemia (CMS-HCC)     Lung disease 2017    COPD    Lupus (systemic lupus erythematosus) (CMS-HCC)     Migraines     Osteoarthritis     Scoliosis      Past Surgical History:   Procedure Laterality Date    CESAREAN SECTION      LAPAROSCOPIC ENDOMETRIOSIS FULGURATION      PARTIAL HYSTERECTOMY      PR UPPER GI ENDOSCOPY,DIAGNOSIS N/A 01/23/2023    Procedure: EGD;  Surgeon: Dory Peru, MD;  Location: ENDO OR [facility];  Service: Gastroenterology     Family History   Problem Relation Age of Onset    Thyroid disease Mother     Diabetes Mother     Lupus Father         My fathers nephew and sister    Diabetes Father     Cancer Sister     Diabetes Brother     Cancer Maternal Uncle     Osteoarthritis Paternal Aunt     Stroke Paternal Uncle     Cancer Maternal Grandmother     Basal cell carcinoma Neg Hx     Melanoma Neg Hx     Squamous cell carcinoma Neg Hx     Glaucoma Neg Hx     Macular degeneration Neg Hx      Social  History     Tobacco Use    Smoking status: Every Day     Current packs/day: 1.00     Average packs/day: 1 pack/day for 15.0 years (15.0 ttl pk-yrs)     Types: Cigarettes    Smokeless tobacco: Never   Substance Use Topics    Alcohol use: No     Patient's Medications   New Prescriptions    No medications on file   Previous Medications    ALBUTEROL (PROVENTIL HFA;VENTOLIN HFA) 90 MCG/ACTUATION INHALER    Inhale 2 puffs every six (6) hours as needed.    AMLODIPINE (NORVASC) 10 MG TABLET    Take 1 tablet (10 mg total) by mouth daily.    BECLOMETHASONE (QVAR) 40 MCG/ACTUATION INHALER    Inhale 2 puffs daily as needed.    CICLOPIROX (LOPROX) 0.77 % CREAM    Apply topically two (2) times a day. Gently massage into affected areas and surrounding skin    CLONIDINE HCL (CATAPRES) 0.1 MG TABLET    Take 1 tablet (0.1 mg total) by mouth two (2) times a day.    DICYCLOMINE (BENTYL) 20 MG TABLET    Take 1 tablet (20 mg total) by mouth two (2) times a day.    EMPTY CONTAINER (SHARPS CONTAINER) MISC    Use as directed    EMPTY CONTAINER MISC    Use as directed    EMPTY CONTAINER MISC    Use as directed    EPINEPHRINE (EPIPEN) 0.3 MG/0.3 ML INJECTION    Inject 0.3 mL (0.3 mg total) into the muscle.    ESCITALOPRAM OXALATE (LEXAPRO) 10 MG TABLET    Take 1 tablet (10 mg total) by mouth daily.    FLUTICASONE PROPIONATE (FLOVENT HFA) 220 MCG/ACTUATION INHALER    2 puffs.    GABAPENTIN (NEURONTIN) 600 MG TABLET    Take 1 tablet (600 mg total) by mouth Three (3) times a day.    HUMIRA PEN CITRATE FREE 40 MG/0.4 ML    Inject the contents of 1 pen (40 mg total) under the skin every seven (7) days.    HYDROCHLOROTHIAZIDE (HYDRODIURIL) 50 MG TABLET    Take 1 tablet (50 mg total) by mouth daily.    HYDROCODONE-ACETAMINOPHEN (NORCO 10-325) 10-325 MG PER TABLET    Take 1 tablet by mouth 5 times daily as needed for pain    HYDROXYCHLOROQUINE (PLAQUENIL) 200 MG TABLET    Take 2 tablets (400 mg total) by mouth daily.    HYSINGLA ER 20 MG TP24    Take 1 tablet by mouth every 24 hours    IPRATROPIUM (ATROVENT) 0.03 % NASAL SPRAY    2 sprays into each nostril.    LISINOPRIL (PRINIVIL,ZESTRIL) 30 MG TABLET    Take 1 tablet (30 mg total) by mouth daily.    METFORMIN (GLUCOPHAGE) 500 MG TABLET    TAKE 1 TABLET BY MOUTH 2 TIMES DAILY WITH MEALS    MITIGARE 0.6 MG CAP CAPSULE    Take 1 capsule (0.6 mg total) by mouth daily.    MONTELUKAST (SINGULAIR) 10 MG TABLET    Take 1 tablet (10 mg total) by mouth nightly.    NARCAN 4 MG/ACTUATION NASAL SPRAY    spray (4 mg) in 1 nostril may repeat dose every 2-3 minutes as needed alternating nostrils with each dose    ONDANSETRON (ZOFRAN-ODT) 4 MG DISINTEGRATING TABLET    Take 1 tablet (4 mg total) by mouth every eight (8) hours as needed for  nausea for up to 7 doses.    POTASSIUM CHLORIDE 10 MEQ ER TABLET        PROMETHAZINE (PHENERGAN) 25 MG TABLET    Take 1 tablet (25 mg total) by mouth every six (6) hours as needed for nausea.    PROPRANOLOL (INDERAL) 40 MG TABLET    Take 1 tablet (40 mg total) by mouth two (2) times a day.    RIFAMPIN (RIFADIN) 300 MG CAPSULE    Take 1 capsule (300 mg total) by mouth two (2) times a day.    SPIRONOLACTONE (ALDACTONE) 25 MG TABLET    Take 1 tablet (25 mg total) by mouth daily.    SYRINGE WITH NEEDLE (BD TUBERCULIN SYRINGE) 1 ML 25 GAUGE X 5/8 SYRG    Use 1 syringe once a week for methotrexate injections.    TOPIRAMATE (TOPAMAX) 50 MG TABLET    TAKE 1 TABLET BY MOUTH 2 TIMES DAILY    TRAZODONE (DESYREL) 50 MG TABLET    Take 1 tablet (50 mg total) by mouth.    VARENICLINE (CHANTIX PAK) 0.5 MG (11)- 1 MG (42) TABLET    Take one 0.5mg  tab once daily for 3 days,then increase to one 0.5mg  tab twice daily for 4 days,then increase to one 1mg  tab twice daily.    VYVANSE 30 MG CAPSULE    Take 1 capsule (30 mg total) by mouth two (2) times a day.   Modified Medications    No medications on file   Discontinued Medications    No medications on file     Allergies   Allergen Reactions    Other Hives, Itching and Other (See Comments)     High fever. Hives and itching all over the body.    Shellfish Containing Products Anaphylaxis    Sulfa (Sulfonamide Antibiotics) Hives, Itching and Rash     Other Reaction(s): Unknown    High fever. Hives and itching all over the body.    Sulfasalazine Hives    Toradol [Ketorolac] Hives    Venom-Honey Bee Anaphylaxis    Morphine Other (See Comments) and Rash     Other Reaction(s): GI intolerance    Other reaction(s): Other (See Comments)   Causes migraine   Headache   Causes migraine    Causes migraine    Opioids - Morphine Analogues      Other reaction(s): Other  Other reaction(s): Other (See Comments)  Causes migraine  Headache    Tramadol Nausea And Vomiting    Hydromorphone Hcl      migraine    Naproxen      Grits teeth    Capsaicin-Menthol Anxiety       PHYSICAL EXAM:  Vitals:    09/17/23 1743   BP: (S) 216/137   Pulse: 83   Resp: 16   Temp: 36.9 ??C (98.4 ??F)   SpO2: 96%     Physical Exam  Constitutional:       General: She is awake. She is not in acute distress.     Appearance: Normal appearance. She is not ill-appearing.   HENT: Head: Normocephalic and atraumatic.      Nose: Nose normal.      Mouth/Throat:      Pharynx: Oropharynx is clear.   Eyes:      Extraocular Movements: Extraocular movements intact.      Conjunctiva/sclera: Conjunctivae normal.      Pupils: Pupils are equal, round, and reactive to light.   Cardiovascular:  Rate and Rhythm: Normal rate and regular rhythm.      Heart sounds: Normal heart sounds.   Pulmonary:      Breath sounds: Normal breath sounds and air entry.   Abdominal:      General: Bowel sounds are normal.      Tenderness: There is no abdominal tenderness. There is no guarding or rebound.   Musculoskeletal:         General: Normal range of motion.      Cervical back: Full passive range of motion without pain and normal range of motion.   Skin:     General: Skin is warm and dry.   Neurological:      General: No focal deficit present.      Mental Status: She is alert and oriented to person, place, and time.   Psychiatric:         Attention and Perception: Attention and perception normal.         Behavior: Behavior is cooperative.         DIAGNOSTICS:   Pulse Ox: Pulse oximetry 96% on RA indicating adequate oxygenation.    Imaging:  No orders to display     Labs:  Labs Reviewed   COMPREHENSIVE METABOLIC PANEL - Abnormal; Notable for the following components:       Result Value    Potassium 2.7 (*)     Chloride 94 (*)     CO2 38.0 (*)     BUN 6 (*)     AST 6 (*)     ALT <3 (*)     All other components within normal limits   URINALYSIS WITH MICROSCOPY - Abnormal; Notable for the following components:    Specific Gravity, UA 1.013 (*)     Protein, UA 1+ (*)     Urobilinogen, UA 1+ (*)     Squam Epithel, UA 5 (*)     All other components within normal limits   DRUG SCREEN, URINE - Abnormal; Notable for the following components:    Opiates Screen, Ur Positive (*)     Oxycodone Screen, Ur Positive (*)     All other components within normal limits    Narrative:     Drug of Abuse for medical purposes only. The positive results have not been confirmed by GC/MS.    Drug Class Screening Cut off Limits  Amphetamine            <1000 ng/ml   Barbiturate            <200 ng/ml   Benzodiazepine         <200 ng/ml   Cannibinoid (THC)      <50 ng/ml   Cocaine                <300 ng/ml   Methadone              <300 ng/ml   Methaqualone           <300 ng/ml  Opiate                 <300 ng/ml  Oxycodone              <100 ng/ml  Phencyclidine (PCP)    <25 ng/ml   Fentanyl               <1 ng/ml   LIPASE - Abnormal; Notable for the following components:    Lipase 3 (*)  All other components within normal limits   CBC W/ AUTO DIFF - Abnormal; Notable for the following components:    WBC 11.8 (*)     MPV 11.2 (*)     Absolute Neutrophils 8.1 (*)     Absolute Monocytes 0.8 (*)     All other components within normal limits   HIGH SENSITIVITY TROPONIN I - SINGLE - Normal   CBC W/ DIFFERENTIAL    Narrative:     The following orders were created for panel order CBC w/ Differential.  Procedure                               Abnormality         Status                     ---------                               -----------         ------                     CBC w/ [id number]]         Abnormal            Final result                 Please view results for these tests on the individual orders.       ED COURSE AND TREATMENT:   Patient???s condition remained stable during Emergency Department evaluation.     External Medical Records Reviewed: The patient visited [facility] on 09/05/23 for a blood pressure check.     Orders written.    Diagnostics reviewed.     Patient was administered the following  Medications   sodium chloride 0.9% (NS) bolus 1,000 mL (has no administration in time range)   potassium chloride 10 mEq in 100 mL IVPB (has no administration in time range)   potassium chloride 10 mEq in 100 mL IVPB (has no administration in time range)   potassium chloride oral solution (has no administration in time range)       BP (S) 216/137  - Pulse 83  - Temp 36.9 ??C (98.4 ??F) (Oral)  - Resp 16  - SpO2 96%     Procedures  Encounter Date: 09/17/23   ECG 12 Lead   Result Value    EKG Systolic BP     EKG Diastolic BP     EKG Ventricular Rate 79    EKG Atrial Rate 79    EKG P-R Interval 126    EKG QRS Duration 106    EKG Q-T Interval 466    EKG QTC Calculation 534    EKG Calculated P Axis 62    EKG Calculated R Axis -21    EKG Calculated T Axis 11    QTC Fredericia 511    Narrative    Normal sinus rhythm  Possible Left atrial enlargement  Incomplete right bundle branch block  Anterior infarct , age undetermined  Prolonged QT  Abnormal ECG  When compared with ECG of 12-Dec-2022 16:02,  QT has lengthened           CLINICAL IMPRESSION:  No diagnosis found.    CONDITION ON DISPOSITION:   {DISCHARGE CONDITION:19696}    PLAN AND FOLLOW-UP:  Patient instructed to follow up with pcp in 5-6 days. Strict return precautions discussed. Patient instructed to return to the ED for persistent or worsening symptoms. Patient expresses understanding and is agreeable with plan.     PRESCRIPTIONS:      Your Medication List        ASK your doctor about these medications      albuterol 90 mcg/actuation inhaler  Commonly known as: PROVENTIL HFA;VENTOLIN HFA  Inhale 2 puffs every six (6) hours as needed.     amlodipine 10 MG tablet  Commonly known as: NORVASC  Take 1 tablet (10 mg total) by mouth daily.     BD TUBERCULIN SYRINGE 1 mL 25 gauge x 5/8 Syrg  Generic drug: syringe with needle  Use 1 syringe once a week for methotrexate injections.     beclomethasone 40 mcg/actuation inhaler  Commonly known as: QVAR  Inhale 2 puffs daily as needed.     ciclopirox 0.77 % cream  Commonly known as: LOPROX  Apply topically two (2) times a day. Gently massage into affected areas and surrounding skin     cloNIDine HCL 0.1 MG tablet  Commonly known as: CATAPRES  Take 1 tablet (0.1 mg total) by mouth two (2) times a day.     diclofenac sodium 1 % gel  Commonly known as: VOLTAREN  Apply 2 g topically four (4) times a day.  Ask about: Should I take this medication?     dicyclomine 20 mg tablet  Commonly known as: BENTYL  Take 1 tablet (20 mg total) by mouth two (2) times a day.     empty container Misc  Commonly known as: sharps container  Use as directed     empty container Misc  Use as directed     empty container Misc  Use as directed     EPINEPHrine 0.3 mg/0.3 mL injection  Commonly known as: EPIPEN  Inject 0.3 mL (0.3 mg total) into the muscle.     escitalopram oxalate 10 MG tablet  Commonly known as: LEXAPRO  Take 1 tablet (10 mg total) by mouth daily.     FLOVENT HFA 220 mcg/actuation inhaler  Generic drug: fluticasone propionate  2 puffs.     gabapentin 600 MG tablet  Commonly known as: NEURONTIN  Take 1 tablet (600 mg total) by mouth Three (3) times a day.     HUMIRA(CF) PEN 40 mg/0.4 mL injection  Generic drug: adalimumab  Inject the contents of 1 pen (40 mg total) under the skin every seven (7) days.     hydroCHLOROthiazide 50 MG tablet  Commonly known as: HYDRODIURIL  Take 1 tablet (50 mg total) by mouth daily.     HYDROcodone-acetaminophen 10-325 mg per tablet  Commonly known as: NORCO 10-325  Take 1 tablet by mouth 5 times daily as needed for pain     hydroxychloroquine 200 mg tablet  Commonly known as: PLAQUENIL  Take 2 tablets (400 mg total) by mouth daily.     HYSINGLA ER 20 mg Tp24  Generic drug: HYDROcodone bitartrate  Take 1 tablet by mouth every 24 hours     ipratropium 21 mcg (0.03 %) nasal spray  Commonly known as: ATROVENT  2 sprays into each nostril.     lisinopril 30 MG tablet  Commonly known as: PRINIVIL,ZESTRIL  Take 1 tablet (30 mg total) by mouth daily.     metFORMIN 500 MG tablet  Commonly known as: GLUCOPHAGE  TAKE 1 TABLET BY MOUTH 2 TIMES  DAILY WITH MEALS     MITIGARE 0.6 mg Cap capsule  Generic drug: colchicine  Take 1 capsule (0.6 mg total) by mouth daily.     montelukast 10 mg tablet  Commonly known as: SINGULAIR  Take 1 tablet (10 mg total) by mouth nightly.     NARCAN 4 mg/actuation nasal spray  Generic drug: naloxone  spray (4 mg) in 1 nostril may repeat dose every 2-3 minutes as needed alternating nostrils with each dose     ondansetron 4 MG disintegrating tablet  Commonly known as: ZOFRAN-ODT  Take 1 tablet (4 mg total) by mouth every eight (8) hours as needed for nausea for up to 7 doses.     potassium chloride 10 MEQ ER tablet     promethazine 25 MG tablet  Commonly known as: PHENERGAN  Take 1 tablet (25 mg total) by mouth every six (6) hours as needed for nausea.     propranolol 40 MG tablet  Commonly known as: INDERAL  Take 1 tablet (40 mg total) by mouth two (2) times a day.     rifAMPin 300 MG capsule  Commonly known as: RIFADIN  Take 1 capsule (300 mg total) by mouth two (2) times a day.     spironolactone 25 MG tablet  Commonly known as: ALDACTONE  Take 1 tablet (25 mg total) by mouth daily.     topiramate 50 MG tablet  Commonly known as: Topamax  TAKE 1 TABLET BY MOUTH 2 TIMES DAILY     traZODone 50 MG tablet  Commonly known as: DESYREL  Take 1 tablet (50 mg total) by mouth.     varenicline 0.5 mg (11)- 1 mg (42) tablet  Commonly known as: CHANTIX PAK  Take one 0.5mg  tab once daily for 3 days,then increase to one 0.5mg  tab twice daily for 4 days,then increase to one 1mg  tab twice daily.     VYVANSE 30 mg Cap capsule  Generic drug: lisdexamfetamine  Take 1 capsule (30 mg total) by mouth two (2) times a day.              MEDICAL DECISION MAKING::  Medical Decision Making      I, Silena Wyss scribed for Mingo Amber, MD on 09/17/23.    This documentation accurately reflects the service I, Mingo Amber, MD, performed and the decisions I made.

## 2023-09-18 NOTE — Unmapped (Cosign Needed)
Medical Screening Exam:  The patient complains of nausea and vomit since last night. Denies any diarrhea. No fever.   VS - Reviewed and Stable   Vitals:    09/17/23 1743   BP: (S) 216/137   Pulse: 83   Resp: 16   Temp: 36.9 ??C (98.4 ??F)   TempSrc: Oral   SpO2: 96%     PE- GEN - A&0 x3, in NAD  Problem Focused Exam Key Findings - VSS, NAD, BP elevated in triage   Appropriate labs/tests/interventions ordered  Patient awaiting bed assignment

## 2023-09-18 NOTE — Unmapped (Signed)
Pt up and ambulatory to restroom, steady gait noted.

## 2023-09-18 NOTE — Unmapped (Signed)
Discharge education reviewed with pt, pt voices understanding. Pt ambulatory to lobby at this time with visitor, steady gait noted.

## 2023-09-18 NOTE — Unmapped (Signed)
Pt up and ambulatory to restroom. Pt back in bed. Denies any needs.

## 2023-09-20 ENCOUNTER — Ambulatory Visit: Admit: 2023-09-20 | Discharge: 2023-09-21 | Payer: PRIVATE HEALTH INSURANCE

## 2023-09-20 DIAGNOSIS — M797 Fibromyalgia: Principal | ICD-10-CM

## 2023-09-20 DIAGNOSIS — I10 Essential (primary) hypertension: Principal | ICD-10-CM

## 2023-09-20 DIAGNOSIS — L732 Hidradenitis suppurativa: Principal | ICD-10-CM

## 2023-09-20 DIAGNOSIS — M359 Systemic involvement of connective tissue, unspecified: Principal | ICD-10-CM

## 2023-09-20 LAB — CBC W/ AUTO DIFF
BASOPHILS ABSOLUTE COUNT: 0 10*9/L (ref 0.0–0.1)
BASOPHILS RELATIVE PERCENT: 0.4 %
EOSINOPHILS ABSOLUTE COUNT: 0.2 10*9/L (ref 0.0–0.5)
EOSINOPHILS RELATIVE PERCENT: 1.7 %
HEMATOCRIT: 40.1 % (ref 34.0–44.0)
HEMOGLOBIN: 13 g/dL (ref 11.3–14.9)
LYMPHOCYTES ABSOLUTE COUNT: 3.3 10*9/L (ref 1.1–3.6)
LYMPHOCYTES RELATIVE PERCENT: 31.1 %
MEAN CORPUSCULAR HEMOGLOBIN CONC: 32.4 g/dL (ref 32.0–36.0)
MEAN CORPUSCULAR HEMOGLOBIN: 26 pg (ref 25.9–32.4)
MEAN CORPUSCULAR VOLUME: 80.4 fL (ref 77.6–95.7)
MEAN PLATELET VOLUME: 9.7 fL (ref 6.8–10.7)
MONOCYTES ABSOLUTE COUNT: 0.8 10*9/L (ref 0.3–0.8)
MONOCYTES RELATIVE PERCENT: 7.8 %
NEUTROPHILS ABSOLUTE COUNT: 6.2 10*9/L (ref 1.8–7.8)
NEUTROPHILS RELATIVE PERCENT: 59 %
PLATELET COUNT: 344 10*9/L (ref 150–450)
RED BLOOD CELL COUNT: 4.99 10*12/L (ref 3.95–5.13)
RED CELL DISTRIBUTION WIDTH: 16 % — ABNORMAL HIGH (ref 12.2–15.2)
WBC ADJUSTED: 10.5 10*9/L (ref 3.6–11.2)

## 2023-09-20 LAB — C4 COMPLEMENT: C4 COMPLEMENT: 17.5 mg/dL (ref 12.0–36.0)

## 2023-09-20 LAB — URINALYSIS WITH MICROSCOPY WITH CULTURE REFLEX PERFORMABLE
BACTERIA: NONE SEEN /HPF
BILIRUBIN UA: NEGATIVE
BLOOD UA: NEGATIVE
HYALINE CASTS: 86 /LPF — ABNORMAL HIGH (ref 0–1)
KETONES UA: NEGATIVE
LEUKOCYTE ESTERASE UA: NEGATIVE
NITRITE UA: NEGATIVE
PH UA: 6 (ref 5.0–9.0)
PROTEIN UA: 100 — AB
RBC UA: 1 /HPF (ref <=4)
SPECIFIC GRAVITY UA: 1.028 (ref 1.003–1.030)
SQUAMOUS EPITHELIAL: 11 /HPF — ABNORMAL HIGH (ref 0–5)
UROBILINOGEN UA: 3 — AB
WBC UA: 5 /HPF (ref 0–5)

## 2023-09-20 LAB — PROTEIN / CREATININE RATIO, URINE
CREATININE, URINE: 383.9 mg/dL
PROTEIN URINE: 184.2 mg/dL
PROTEIN/CREAT RATIO, URINE: 0.48

## 2023-09-20 LAB — BUN: BLOOD UREA NITROGEN: 9 mg/dL (ref 9–23)

## 2023-09-20 LAB — C3 COMPLEMENT: C3 COMPLEMENT: 87 mg/dL — ABNORMAL LOW (ref 90–170)

## 2023-09-20 LAB — CREATININE
CREATININE: 0.75 mg/dL
EGFR CKD-EPI (2021) FEMALE: >90 mL/min/{1.73_m2} (ref >=60)

## 2023-09-20 NOTE — Unmapped (Signed)
Reduce plaquenil to 1 pill daily for 2 weeks, then stop.
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
## 2023-09-25 LAB — ANTI-DNA ANTIBODY, DOUBLE-STRANDED: DSDNA ANTIBODY: NEGATIVE

## 2023-09-30 ENCOUNTER — Ambulatory Visit: Admit: 2023-09-30 | Discharge: 2023-10-01 | Payer: PRIVATE HEALTH INSURANCE | Attending: Family | Primary: Family

## 2023-09-30 DIAGNOSIS — Z760 Encounter for issue of repeat prescription: Principal | ICD-10-CM

## 2023-09-30 DIAGNOSIS — R0609 Other forms of dyspnea: Principal | ICD-10-CM

## 2023-09-30 DIAGNOSIS — R9431 Abnormal electrocardiogram [ECG] [EKG]: Principal | ICD-10-CM

## 2023-09-30 DIAGNOSIS — R072 Precordial pain: Principal | ICD-10-CM

## 2023-09-30 DIAGNOSIS — I10 Essential (primary) hypertension: Principal | ICD-10-CM

## 2023-09-30 MED ORDER — SPIRONOLACTONE 50 MG TABLET
ORAL_TABLET | Freq: Every day | ORAL | 11 refills | 30 days | Status: CP
Start: 2023-09-30 — End: ?

## 2023-09-30 MED ORDER — LISINOPRIL 40 MG TABLET
ORAL_TABLET | Freq: Every day | ORAL | 11 refills | 30 days | Status: CP
Start: 2023-09-30 — End: ?

## 2023-09-30 NOTE — Unmapped (Signed)
Reason for Visit: Chest pressure    Cardiovascular History:   none    History of Present Illness:   Kim Charles is a pleasant 42 y.o. female who has been referred by their PCP,  Iran Planas, FNP, to the [facility] for evaluation of chest pain. Patient has a past medical history signficant for Lupus, DM type II, HTN, fibromyalgia, Asthma and Addison's disease.  Patient presents with complaints of chest pain which she described as a pressure sensation over the past month.  She also complains of numbness in her left arm as well as dyspnea on exertion.  Chest pain is associated with shortness of breath however she denies any nausea, vomiting or diaphoresis.  Patient underwent an echocardiogram in 2021 which showed EF greater than 55% with no wall motion abnormalities.  She also went a nuclear stress test in 2021 which showed normal myocardial perfusion.  Patient complains of generalized bodyaches.  She denies any orthopnea, PND, dizziness or syncope.      ROS: Review of Systems   Constitutional:  Negative for fatigue.   HENT: Negative.     Eyes: Negative.    Respiratory:  Positive for shortness of breath.    Cardiovascular:  Positive for chest pain. Negative for palpitations and leg swelling.   Gastrointestinal: Negative.    Endocrine: Negative.    Genitourinary: Negative.    Musculoskeletal: Negative.    Skin: Negative.    Allergic/Immunologic: Negative.    Neurological:  Negative for dizziness, syncope and light-headedness.   Hematological: Negative.    Psychiatric/Behavioral: Negative.           Past Medical History:  Past Medical History:   Diagnosis Date    ADHD (attention deficit hyperactivity disorder)     Allergic 2007    Allergic to shrimp    Anemia     Asthma     Chronic pain     Disorder of skin or subcutaneous tissue 2001    Hidradenitis suprative    Fibromyalgia     Gastric ulcer     Hidradenitis suppurativa     Hypertension     Leukemia (CMS-HCC)     Lung disease 2017    COPD    Migraines     Osteoarthritis     Scoliosis     Undifferentiated connective tissue disease (CMS-HCC)        Social History:  Social History     Tobacco Use    Smoking status: Every Day     Current packs/day: 1.00     Average packs/day: 1 pack/day for 15.0 years (15.0 ttl pk-yrs)     Types: Cigarettes    Smokeless tobacco: Never   Vaping Use    Vaping status: Never Used   Substance Use Topics    Alcohol use: No    Drug use: No       Family History:  Family History   Problem Relation Age of Onset    Thyroid disease Mother     Diabetes Mother     Lupus Father         My fathers nephew and sister    Diabetes Father     Cancer Sister     Diabetes Brother     Cancer Maternal Uncle     Osteoarthritis Paternal Aunt     Stroke Paternal Uncle     Cancer Maternal Grandmother     Basal cell carcinoma Neg Hx     Melanoma Neg  Hx     Squamous cell carcinoma Neg Hx     Glaucoma Neg Hx     Macular degeneration Neg Hx      + CAD in mother    Allergies:  Other, Shellfish containing products, Sulfa (sulfonamide antibiotics), Sulfasalazine, Toradol [ketorolac], Venom-honey bee, Morphine, Opioids - morphine analogues, Tramadol, Hydromorphone hcl, Naproxen, and Capsaicin-menthol    Medications:  Current Outpatient Medications   Medication Sig Dispense Refill    albuterol (PROVENTIL HFA;VENTOLIN HFA) 90 mcg/actuation inhaler Inhale 2 puffs every six (6) hours as needed. 18 g 3    amlodipine (NORVASC) 10 MG tablet Take 1 tablet (10 mg total) by mouth daily. 90 tablet 1    beclomethasone (QVAR) 40 mcg/actuation inhaler Inhale 2 puffs daily as needed.      ciclopirox (LOPROX) 0.77 % cream Apply topically two (2) times a day. Gently massage into affected areas and surrounding skin 15 g 0    cloNIDine HCL (CATAPRES) 0.1 MG tablet Take 1 tablet (0.1 mg total) by mouth two (2) times a day. 180 tablet 1    dicyclomine (BENTYL) 20 mg tablet Take 1 tablet (20 mg total) by mouth two (2) times a day. 180 tablet 1    EPINEPHrine (EPIPEN) 0.3 mg/0.3 mL injection Inject 0.3 mL (0.3 mg total) into the muscle.      fluticasone propionate (FLOVENT HFA) 220 mcg/actuation inhaler 2 puffs.      gabapentin (NEURONTIN) 600 MG tablet Take 1 tablet (600 mg total) by mouth Three (3) times a day. 270 tablet 3    hydroxychloroquine (PLAQUENIL) 200 mg tablet Take 2 tablets (400 mg total) by mouth daily. 180 tablet 3    HYSINGLA ER 20 mg TP24 Take 1 tablet by mouth every 24 hours      ipratropium (ATROVENT) 0.03 % nasal spray 2 sprays into each nostril.      KORLYM 300 mg Tab       lisinopril (PRINIVIL,ZESTRIL) 30 MG tablet Take 1 tablet (30 mg total) by mouth daily. 90 tablet 1    metFORMIN (GLUCOPHAGE) 500 MG tablet TAKE 1 TABLET BY MOUTH 2 TIMES DAILY WITH MEALS 180 tablet 3    MITIGARE 0.6 mg cap capsule Take 1 capsule (0.6 mg total) by mouth daily.      montelukast (SINGULAIR) 10 mg tablet Take 1 tablet (10 mg total) by mouth nightly. 90 tablet 1    ondansetron (ZOFRAN-ODT) 4 MG disintegrating tablet Take 1 tablet (4 mg total) by mouth every eight (8) hours as needed for nausea for up to 7 doses. 7 tablet 0    oxyCODONE (ROXICODONE) 10 mg immediate release tablet Take 1 tablet (10 mg total) by mouth every six (6) hours as needed for pain.      potassium chloride 20 mEq TbER 20 mEq daily.      promethazine (PHENERGAN) 25 MG tablet Take 1 tablet (25 mg total) by mouth every six (6) hours as needed for nausea. 30 tablet 1    propranolol (INDERAL) 40 MG tablet Take 1 tablet (40 mg total) by mouth two (2) times a day. 180 tablet 1    rifAMPin (RIFADIN) 300 MG capsule Take 1 capsule (300 mg total) by mouth two (2) times a day. 60 capsule 2    spironolactone (ALDACTONE) 25 MG tablet Take 1 tablet (25 mg total) by mouth daily. 90 tablet 1    topiramate (TOPAMAX) 50 MG tablet TAKE 1 TABLET BY MOUTH 2 TIMES DAILY  5  traZODone (DESYREL) 50 MG tablet Take 1 tablet (50 mg total) by mouth.      varenicline (CHANTIX PAK) 0.5 mg (11)- 1 mg (42) tablet Take one 0.5mg  tab once daily for 3 days,then increase to one 0.5mg  tab twice daily for 4 days,then increase to one 1mg  tab twice daily. 1 each 0    empty container Misc Use as directed (Patient not taking: Reported on 03/20/2023) 1 each 4    escitalopram oxalate (LEXAPRO) 10 MG tablet Take 1 tablet (10 mg total) by mouth daily. (Patient not taking: Reported on 09/30/2023)  3    HUMIRA PEN CITRATE FREE 40 MG/0.4 ML Inject the contents of 1 pen (40 mg total) under the skin every seven (7) days. (Patient not taking: Reported on 09/30/2023) 4 each 4    hydroCHLOROthiazide (HYDRODIURIL) 50 MG tablet Take 1 tablet (50 mg total) by mouth daily. (Patient not taking: Reported on 09/30/2023) 90 tablet 0    NARCAN 4 mg/actuation nasal spray spray (4 mg) in 1 nostril may repeat dose every 2-3 minutes as needed alternating nostrils with each dose (Patient not taking: Reported on 03/20/2023)      syringe with needle (BD TUBERCULIN SYRINGE) 1 mL 25 gauge x 5/8 Syrg Use 1 syringe once a week for methotrexate injections. (Patient not taking: Reported on 03/20/2023) 50 each 7     No current facility-administered medications for this visit.       Vital Signs:   BP 197/120 (BP Site: L Arm, BP Position: Sitting)  - Pulse 65  - Wt 54.3 kg (119 lb 12.8 oz)  - SpO2 100%  - BMI 22.64 kg/m??     Exam: Physical Exam  Constitutional:       Appearance: Normal appearance.   HENT:      Head: Normocephalic and atraumatic.      Nose: Nose normal.   Eyes:      Extraocular Movements: Extraocular movements intact.      Conjunctiva/sclera: Conjunctivae normal.   Cardiovascular:      Rate and Rhythm: Normal rate and regular rhythm.      Pulses: Normal pulses.      Heart sounds: Normal heart sounds.   Pulmonary:      Effort: Pulmonary effort is normal.      Breath sounds: Normal breath sounds.   Abdominal:      General: Abdomen is flat. Bowel sounds are normal.      Palpations: Abdomen is soft.   Musculoskeletal:         General: Normal range of motion.      Cervical back: Normal range of motion and neck supple.   Skin:     General: Skin is warm and dry.   Neurological:      General: No focal deficit present.      Mental Status: She is alert and oriented to person, place, and time.   Psychiatric:         Mood and Affect: Mood normal.         Behavior: Behavior normal.         EKG: normal sinus rhythm, right axis deviation .      Impression.Plan:      Uncontrolled HTN  -Patient's blood pressure at today's visit is 197/120, recheck 180/120  -Would recommend increasing lisinopril to 40 mg daily and increasing spironolactone to 50 mg daily  -Continue on amlodipine 10 mg daily and clonidine 0.1 mg twice daily  -If patient blood pressure continues to  be elevated at nurse visit would recommend discontinuing propranolol and starting patient on carvedilol  -Patient should follow-up in 2 weeks for a nurse visit for blood pressure check  -Patient is encouraged to monitor blood pressure at home and keep a log and bring to next visit.  -Low-sodium diet plan    Chest pain  -EKG in office today shows normal sinus rhythm with right axis deviation and incomplete right bundle branch block, personally reviewed by myself  -Would recommend patient to go nuclear pharmacological stress test to evaluate myocardial perfusion.  Patient is unable to walk safely on the treadmill due to lupus  -Continue on beta-blocker as prescribed  -Further recommendations will depend on results of nuclear stress test.    Dyspnea on exertion   -Would recommend patient to go an echocardiogram to evaluate for any structural abnormalities      Follow up:   6 weeks      Portions of this note were dictated using Dragon software.  It has been reviewed for accuracy, but may contain grammatical and clerical errors.    Vianne Bulls, FNP

## 2023-10-04 ENCOUNTER — Encounter: Admit: 2023-10-04 | Discharge: 2023-10-04

## 2023-10-09 ENCOUNTER — Institutional Professional Consult (permissible substitution): Admit: 2023-10-09 | Discharge: 2023-10-10

## 2023-10-15 NOTE — Unmapped (Signed)
Patient in today for a nurse visit to assess bp. Pt is alert and oriented x 3. Pt denies chest pain or shortness of breath. No acute distress noted. On patients last visit with Evalee Mutton, NP Lisinopril was increased to 40 mg daily and Spironolactone to 50 mg daily due to htn and edema. Today BP has improved, could be better. Pt states she is stressed out from arguing with her bp and is in pain, as well as had recently been taken off her Plaquenil. Edema noted in bilateral feet at this time. Pt educated to wear compression socks and elevate feet as much as possible. Pt educated that echo is ordered to assess function of her heart and she requests to reschedule it due to appt conflict. Pt educated to continue medications at this time.

## 2023-10-21 DIAGNOSIS — R072 Precordial pain: Principal | ICD-10-CM

## 2023-10-29 ENCOUNTER — Ambulatory Visit: Admit: 2023-10-29 | Discharge: 2023-10-30

## 2023-10-30 ENCOUNTER — Encounter: Admit: 2023-10-30 | Discharge: 2023-10-30 | Attending: Family | Primary: Family

## 2023-11-12 ENCOUNTER — Ambulatory Visit: Admit: 2023-11-12 | Discharge: 2023-11-13 | Payer: PRIVATE HEALTH INSURANCE

## 2023-11-19 NOTE — Unmapped (Signed)
-----   Message from Evalee Mutton, FNP sent at 11/14/2023 12:26 PM EST -----  EF is normal, moderate MR

## 2023-11-19 NOTE — Unmapped (Signed)
Pt verbalized understandings

## 2023-11-19 NOTE — Unmapped (Signed)
-----   Message from Evalee Mutton, FNP sent at 11/14/2023 12:26 PM EST -----  Suboptimal test due to unable to reach target HR, No acute ischemic changes on EKG. Patient should follow-up in 4 weeks, if still having complaints recommend walking lexi

## 2023-12-10 ENCOUNTER — Ambulatory Visit
Admit: 2023-12-10 | Discharge: 2023-12-11 | Payer: PRIVATE HEALTH INSURANCE | Attending: Adult Health | Primary: Adult Health

## 2023-12-10 DIAGNOSIS — I10 Essential (primary) hypertension: Principal | ICD-10-CM

## 2023-12-10 DIAGNOSIS — Z760 Encounter for issue of repeat prescription: Principal | ICD-10-CM

## 2023-12-10 MED ORDER — CLONIDINE HCL 0.1 MG TABLET
ORAL_TABLET | Freq: Two times a day (BID) | ORAL | 11 refills | 30.00 days
Start: 2023-12-10 — End: ?

## 2023-12-10 MED ORDER — LISINOPRIL 40 MG TABLET
ORAL_TABLET | Freq: Every day | ORAL | 11 refills | 30.00 days
Start: 2023-12-10 — End: ?

## 2023-12-10 MED ORDER — PROPRANOLOL 40 MG TABLET
ORAL_TABLET | Freq: Two times a day (BID) | ORAL | 11 refills | 30.00 days
Start: 2023-12-10 — End: ?

## 2023-12-10 MED ORDER — SPIRONOLACTONE 50 MG TABLET
ORAL_TABLET | Freq: Every day | ORAL | 11 refills | 30.00 days
Start: 2023-12-10 — End: ?

## 2023-12-10 MED ORDER — AMLODIPINE 10 MG TABLET
ORAL_TABLET | Freq: Every day | ORAL | 11 refills | 30.00 days
Start: 2023-12-10 — End: 2024-06-07

## 2023-12-10 MED ADMIN — cloNIDine HCL (CATAPRES) tablet 0.1 mg: .1 mg | ORAL | @ 20:00:00 | Stop: 2023-12-10

## 2023-12-10 NOTE — Unmapped (Signed)
Reason for Visit:  Follow up     Cardiac History:   None    History of Present Illness:   Kim Charles is a pleasant 42 y.o. female who presents to [facility] cardiology clinic for follow-up.  Patient previously seen by Evalee Mutton, NP.  Patient has a past medical history signficant for Lupus, DM type II, HTN, fibromyalgia, Asthma and Addison's disease.  At previous visit, patient presented with complaints of chest pain which she described as a pressure sensation over the past month. She also complained of numbness in her left arm as well as dyspnea on exertion. Chest pain was associated with shortness of breath however she denied any nausea, vomiting or diaphoresis. Patient underwent an echocardiogram in 2021 which showed EF greater than 55% with no wall motion abnormalities. She also went a nuclear stress test in 2021 which showed normal myocardial perfusion.     Today, she presents with her mother that joins in discussion.  Patient reports that she has not been taking any of her medications and upon initial check of blood pressure in office blood pressure was 167/113.  She did report some shortness of breath but denied any chest pain, chest pressure, or tightness.  Patient reported that she was under a lot of stress with her boyfriend and family.  She denies any dyspnea, palpitations, dizziness, unusual bleeding, orthopnea, PND, bilateral lower extremity edema, syncope, or syncopal episodes.  She does continue to smoke 1 pack of cigarettes per day but denies any illegal drug use or marijuana use.    ROS:   A 10 - point review of systems has been performed and found negative except for what was already stated in the HPI/ Current Assessment.    Past Medical History:  Past Medical History:   Diagnosis Date    ADHD (attention deficit hyperactivity disorder)     Allergic 2007    Allergic to shrimp    Anemia     Asthma     Chronic pain     Disorder of skin or subcutaneous tissue 2001    Hidradenitis suprative Fibromyalgia     Gastric ulcer     Hidradenitis suppurativa     Hypertension     Leukemia (CMS-HCC)     Lung disease 2017    COPD    Migraines     Osteoarthritis     Scoliosis     Undifferentiated connective tissue disease (CMS-HCC)        Social History:  Social History     Tobacco Use    Smoking status: Every Day     Current packs/day: 1.00     Average packs/day: 1 pack/day for 15.0 years (15.0 ttl pk-yrs)     Types: Cigarettes    Smokeless tobacco: Never   Vaping Use    Vaping status: Never Used   Substance Use Topics    Alcohol use: No    Drug use: No       Family History:  Family History   Problem Relation Age of Onset    Thyroid disease Mother     Diabetes Mother     Lupus Father         My fathers nephew and sister    Diabetes Father     Cancer Sister     Diabetes Brother     Cancer Maternal Uncle     Osteoarthritis Paternal Aunt     Stroke Paternal Uncle     Cancer Maternal Grandmother  Basal cell carcinoma Neg Hx     Melanoma Neg Hx     Squamous cell carcinoma Neg Hx     Glaucoma Neg Hx     Macular degeneration Neg Hx        Allergies:  Other, Shellfish containing products, Sulfa (sulfonamide antibiotics), Sulfasalazine, Toradol [ketorolac], Venom-honey bee, Morphine, Opioids - morphine analogues, Tramadol, Hydromorphone hcl, Naproxen, and Capsaicin-menthol    Medications:  Current Outpatient Medications   Medication Sig Dispense Refill    aspirin 81 MG chewable tablet Chew 1 tablet (81 mg total).      cloNIDine HCL (CATAPRES) 0.1 MG tablet Take 1 tablet (0.1 mg total) by mouth two (2) times a day. 180 tablet 1    gabapentin (NEURONTIN) 600 MG tablet Take 1 tablet (600 mg total) by mouth Three (3) times a day. 270 tablet 3    HYDROcodone-acetaminophen (NORCO) 5-325 mg per tablet Take 1 tablet by mouth two (2) times a day.      lisinopril (PRINIVIL,ZESTRIL) 40 MG tablet Take 1 tablet (40 mg total) by mouth daily. 30 tablet 11    ondansetron (ZOFRAN-ODT) 4 MG disintegrating tablet Take 1 tablet (4 mg total) by mouth every eight (8) hours as needed for nausea for up to 7 doses. 7 tablet 0    promethazine (PHENERGAN) 25 MG tablet Take 1 tablet (25 mg total) by mouth every six (6) hours as needed for nausea. 30 tablet 1    propranolol (INDERAL) 40 MG tablet Take 1 tablet (40 mg total) by mouth two (2) times a day. 180 tablet 1    albuterol (PROVENTIL HFA;VENTOLIN HFA) 90 mcg/actuation inhaler Inhale 2 puffs every six (6) hours as needed. (Patient not taking: Reported on 12/10/2023) 18 g 3    amlodipine (NORVASC) 10 MG tablet Take 1 tablet (10 mg total) by mouth daily. (Patient not taking: Reported on 12/10/2023) 90 tablet 1    beclomethasone (QVAR) 40 mcg/actuation inhaler Inhale 2 puffs daily as needed. (Patient not taking: Reported on 12/10/2023)      ciclopirox (LOPROX) 0.77 % cream Apply topically two (2) times a day. Gently massage into affected areas and surrounding skin (Patient not taking: Reported on 12/10/2023) 15 g 0    dicyclomine (BENTYL) 20 mg tablet Take 1 tablet (20 mg total) by mouth two (2) times a day. (Patient not taking: Reported on 12/10/2023) 180 tablet 1    empty container Misc Use as directed 1 each 4    EPINEPHrine (EPIPEN) 0.3 mg/0.3 mL injection Inject 0.3 mL (0.3 mg total) into the muscle.      fluticasone propionate (FLOVENT HFA) 220 mcg/actuation inhaler 2 puffs. (Patient not taking: Reported on 12/10/2023)      hydroxychloroquine (PLAQUENIL) 200 mg tablet Take 2 tablets (400 mg total) by mouth daily. (Patient not taking: Reported on 12/10/2023) 180 tablet 3    HYSINGLA ER 20 mg TP24 Take 1 tablet by mouth every 24 hours (Patient not taking: Reported on 12/10/2023)      ipratropium (ATROVENT) 0.03 % nasal spray 2 sprays into each nostril. (Patient not taking: Reported on 12/10/2023)      KORLYM 300 mg Tab  (Patient not taking: Reported on 12/10/2023)      metFORMIN (GLUCOPHAGE) 500 MG tablet TAKE 1 TABLET BY MOUTH 2 TIMES DAILY WITH MEALS (Patient not taking: Reported on 12/10/2023) 180 tablet 3    MITIGARE 0.6 mg cap capsule Take 1 capsule (0.6 mg total) by mouth daily. (Patient not taking: Reported on 12/10/2023)  montelukast (SINGULAIR) 10 mg tablet Take 1 tablet (10 mg total) by mouth nightly. (Patient not taking: Reported on 12/10/2023) 90 tablet 1    NARCAN 4 mg/actuation nasal spray       oxyCODONE (ROXICODONE) 10 mg immediate release tablet Take 1 tablet (10 mg total) by mouth every six (6) hours as needed for pain.      potassium chloride 20 mEq TbER 20 mEq daily. (Patient not taking: Reported on 12/10/2023)      rifAMPin (RIFADIN) 300 MG capsule Take 1 capsule (300 mg total) by mouth two (2) times a day. (Patient not taking: Reported on 12/10/2023) 60 capsule 2    spironolactone (ALDACTONE) 50 MG tablet Take 1 tablet (50 mg total) by mouth daily. (Patient not taking: Reported on 12/10/2023) 30 tablet 11    syringe with needle (BD TUBERCULIN SYRINGE) 1 mL 25 gauge x 5/8 Syrg Use 1 syringe once a week for methotrexate injections. 50 each 7    topiramate (TOPAMAX) 50 MG tablet TAKE 1 TABLET BY MOUTH 2 TIMES DAILY (Patient not taking: Reported on 12/10/2023)  5    traZODone (DESYREL) 50 MG tablet Take 1 tablet (50 mg total) by mouth. (Patient not taking: Reported on 12/10/2023)       No current facility-administered medications for this visit.       Vital Signs:   BP 167/113 (BP Site: R Arm, BP Position: Sitting)  - Wt 62.1 kg (137 lb)  - SpO2 99%  - BMI 25.89 kg/m??     Constitutional:    Appearance: Well-developed. BMI 25.89  ENT:   Ears are symmetrical. Hearing intact.   No nasal deformities noted.  Mucous membranes moist.   Head:   Normocephalic.   Eyes:   Pupils: Pupils are equal, round, and reactive to light.   Neck:   Vascular: No JVD.   Cardiovascular:   Rate and Rhythm: Normal rate and regular rhythm.   Heart sounds: Normal heart sounds. No murmur heard.   No friction rub. No gallop.  No edema present.   Pulmonary:   Effort: Pulmonary effort is normal.   Breath sounds: Normal breath sounds.   Abdominal:   General: Bowel sounds are normal.   Palpations: Abdomen is soft.   Musculoskeletal:      General: Normal range of motion.   Cervical back: Normal range of motion.   Skin:  General: Skin is warm and dry.  Neurological:   Mental Status: Alert and oriented to person, place, and time.   Psychiatric:      Behavior: Behavior normal.     Patient appears euvolemic on exam and hemodynamically stable.     ECG:   Normal sinus rhythm  Possible Left atrial enlargement  Incomplete right bundle branch block  Anterior infarct , age undetermined  Prolonged QT  Abnormal ECG  When compared with ECG of 12-Dec-2022 16:02,  QT has lengthened  Confirmed by Sherrilyn Rist ([id number]) on 09/17/2023 9:40:02 PM    Echocardiogram: 11/2023  Summary    1. The left ventricle is normal in size with upper normal wall thickness.    2. The left ventricular systolic function is borderline, LVEF is visually  estimated at 50-55%.    3. The mitral valve leaflets are normal with reduced leaflet mobility.    4. There is moderate mitral valve regurgitation.    5. The left atrium is mildly to moderately dilated in size.    6. The right ventricle is normal in size, with normal  systolic function.    7. There is moderate tricuspid regurgitation.    8. LV global longitudinal strain: -18.1 %.    9. The left ventricular ejection fraction was quantified (3D) at 52 %.    Cardiac Stress/PET: 11/2023  Summary    1. The patient was tested using the Bruce protocol for a duration of 06:18  mm:ss and achieved 7.1 METs. A maximum heart rate of 131 bpm with a target  predicted heart rate of 86% was obtained at 05:50. A maximum systolic blood  pressure of 144/82 was obtained at 02:14 and a maximum diastolic blood  pressure of 140/90 was obtained at 06:37. Appropriate heart rate and blood  pressure response to exercise. Unable to reach target HR, patient not able to  continue walking due to leg and knee pain. No chest pain or shortness of  breath noted during the test.    2. Normal stress test and stress ECG but sub-optimal heart rate response.    3. Exercise capacity fair to good at 6-10 METS.    Past Medical History:   Diagnosis Date    ADHD (attention deficit hyperactivity disorder)     Allergic 2007    Allergic to shrimp    Anemia     Asthma     Chronic pain     Disorder of skin or subcutaneous tissue 2001    Hidradenitis suprative    Fibromyalgia     Gastric ulcer     Hidradenitis suppurativa     Hypertension     Leukemia (CMS-HCC)     Lung disease 2017    COPD    Migraines     Osteoarthritis     Scoliosis     Undifferentiated connective tissue disease (CMS-HCC)      Impression/Plan:   # Uncontrolled hypertension/medication refill  -Blood pressure today in office 167/113, recheck 15 minutes after 168/120, recheck 15 minutes post Clonidine 0.1 mg x 1 tablet 160/103  -Patient advised to monitor blood pressure and heart rate at home closely  -Reports that she has not been taken her blood pressure medication as prescribed   -Advised to continue lisinopril 40 mg daily, Spironolactone 50 mg daily, Amlodipine 10 mg daily and Clonidine 0.1 mg twice daily, propranolol 40 mg twice daily  -If patient blood pressure continues to be elevated at nurse visit would recommend discontinuing propranolol and starting patient on carvedilol   -Reiterated the importance of aggressive cardiac risk factor modifications and education provided to patient regarding compliance with AHA diet, daily sodium restriction and 30 minutes per day of physical activity/ walking. The importance of adhering to a heart healthy diet, with treatment goals to include blood pressure less than 130/80 mmHg, and LDL cholesterol less than 70 mg/dL. Patient encouraged to wear compression stockings as directed for any bilateral lower extremity edema. Encouraged to elevate legs preferably above the level of the heart when resting at home. Encouraged to lose weight and/or maintain ideal body weight. #Dyspnea on exertion  -Encourage patient to stop smoking  -Echocardiogram from November 2024 indicated LVEF 50 to 55%, moderate mitral valve regurg, moderate tricuspid regurg.  -Repeat echocardiogram in 6 months    # Tobacco use 1 pack of cigarettes per day  -Patient continues to smoke   -Discussed the impact of tobacco use on health (especially the cardiovascular detrimental effects including heart attack and death), benefits of tobacco cessation, support resources including 1 800 quit now, text QUIT to 231-213-8023, web-based resources (like smokefree.gov/build-your-quit-plan;Ucanquit2.org), MTF tobacco  cessation program, medication options and quit strategies with patient.  Nicotine alternatives such as patches were offered to patient.     No changes in medication regimen were prescribed.    Refills on all cardiac medications sent to pharmacy.    Patient has routine follow-ups with PCP for ongoing chronic care management/lab work.    I spent 40 minutes reviewing past medical records, radiological studies, lab reports, and evaluating the patient for medical determination related to assessment and plan.    Follow up:                       SN visit in 2 weeks/ 3 months with provider    or as needed. Optimal heart health and diet information provided to patient.  Patient voiced understanding, all questions and concerns addressed. We would expect patient to contact the office or report to the nearest ED with any chest pain, arrhythmias or changes to cardiac status.     This note for cardiology evaluation/assessment of this patient was dictated using Dragon software which is a [employer] consultant which frequently causes incorrect words to be placed in the dictation thus possibly changing the meaning of the note.  Every attempt has been made to ensure that the note is correct, but some of these errors may be missed inadvertently.  If the meaning of the note is found to be ambiguous or in error due to the software choosing an incorrect word for the dictation, please notify the Thereasa Parkin of the incorrect note so that an appropriate correction may be made if needed.     Shanon Brow, DNP  [facility] Cardiology and Cardiovascular  [phone]

## 2023-12-11 MED ORDER — CLONIDINE HCL 0.1 MG TABLET
ORAL_TABLET | Freq: Two times a day (BID) | ORAL | 11 refills | 30 days | Status: CP
Start: 2023-12-11 — End: ?

## 2023-12-11 MED ORDER — SPIRONOLACTONE 50 MG TABLET
ORAL_TABLET | Freq: Every day | ORAL | 11 refills | 30.00 days | Status: CP
Start: 2023-12-11 — End: ?

## 2023-12-11 MED ORDER — LISINOPRIL 40 MG TABLET
ORAL_TABLET | Freq: Every day | ORAL | 11 refills | 30.00 days | Status: CP
Start: 2023-12-11 — End: ?

## 2023-12-11 MED ORDER — AMLODIPINE 10 MG TABLET
ORAL_TABLET | Freq: Every day | ORAL | 11 refills | 30 days | Status: CP
Start: 2023-12-11 — End: 2024-06-08

## 2023-12-11 MED ORDER — PROPRANOLOL 40 MG TABLET
ORAL_TABLET | Freq: Two times a day (BID) | ORAL | 11 refills | 30.00 days | Status: CP
Start: 2023-12-11 — End: ?

## 2024-01-02 ENCOUNTER — Ambulatory Visit: Admit: 2024-01-02 | Payer: PRIVATE HEALTH INSURANCE

## 2024-01-28 ENCOUNTER — Inpatient Hospital Stay: Admit: 2024-01-28 | Discharge: 2024-01-29 | Payer: PRIVATE HEALTH INSURANCE

## 2024-01-29 ENCOUNTER — Inpatient Hospital Stay: Admit: 2024-01-29 | Discharge: 2024-01-30 | Payer: PRIVATE HEALTH INSURANCE

## 2024-02-03 NOTE — Unmapped (Signed)
The [facility] has made a second and final attempt to reach this patient to refill the following medication: Humira.      We have left voicemails on the following phone numbers: [phone], have been unable to leave messages on the following phone numbers: [phone]  (Number not in service), and have sent a MyChart message.    Dates contacted: 6/25, 7/3  Last scheduled delivery: 06/05/23    The patient may be at risk of non-compliance with this medication. The patient should call the [facility] at [phone]  Option 4, then Option 2: Dermatology, Gastroenterology, Rheumatology to refill medication.    Wilburt Messina [facility] Specialty Pharmacist        Specialty Medication(s): Humira    Ms.Panas has been dis-enrolled from the [facility] specialty pharmacy services due to multiple unsuccessful outreach attempts by the pharmacy/appears patient may have stopped therapy.    Additional information provided to the patient: per Rheum visit notes 09/2023, patient wished to stop Humira because HS had been stable.     Maegan Buller [facility] Specialty Pharmacist

## 2024-02-20 ENCOUNTER — Encounter: Admit: 2024-02-20 | Discharge: 2024-02-21 | Payer: PRIVATE HEALTH INSURANCE

## 2024-02-20 DIAGNOSIS — I639 Cerebral infarction, unspecified: Principal | ICD-10-CM

## 2024-02-20 DIAGNOSIS — M359 Systemic involvement of connective tissue, unspecified: Principal | ICD-10-CM

## 2024-02-20 DIAGNOSIS — R7989 Other specified abnormal findings of blood chemistry: Principal | ICD-10-CM

## 2024-02-26 ENCOUNTER — Ambulatory Visit: Admit: 2024-02-26 | Discharge: 2024-02-26 | Payer: PRIVATE HEALTH INSURANCE

## 2024-02-26 DIAGNOSIS — I639 Cerebral infarction, unspecified: Principal | ICD-10-CM

## 2024-02-26 DIAGNOSIS — R7989 Other specified abnormal findings of blood chemistry: Principal | ICD-10-CM

## 2024-02-26 DIAGNOSIS — M359 Systemic involvement of connective tissue, unspecified: Principal | ICD-10-CM

## 2024-03-10 ENCOUNTER — Ambulatory Visit
Admit: 2024-03-10 | Discharge: 2024-03-11 | Payer: PRIVATE HEALTH INSURANCE | Attending: Adult Health | Primary: Adult Health

## 2024-03-10 DIAGNOSIS — E782 Mixed hyperlipidemia: Principal | ICD-10-CM

## 2024-03-10 DIAGNOSIS — R9431 Abnormal electrocardiogram [ECG] [EKG]: Principal | ICD-10-CM

## 2024-03-10 DIAGNOSIS — Z720 Tobacco use: Principal | ICD-10-CM

## 2024-03-10 DIAGNOSIS — I10 Essential (primary) hypertension: Principal | ICD-10-CM

## 2024-03-10 MED ORDER — ASPIRIN 81 MG CHEWABLE TABLET
ORAL_TABLET | Freq: Every day | ORAL | 11 refills | 30.00 days | Status: CP
Start: 2024-03-10 — End: 2025-03-10

## 2024-03-10 MED ORDER — NITROGLYCERIN 0.4 MG SUBLINGUAL TABLET
ORAL_TABLET | SUBLINGUAL | 0 refills | 1.00 days | Status: CP | PRN
Start: 2024-03-10 — End: 2025-03-10

## 2024-03-12 ENCOUNTER — Ambulatory Visit
Admit: 2024-03-12 | Discharge: 2024-03-13 | Payer: PRIVATE HEALTH INSURANCE | Attending: Student in an Organized Health Care Education/Training Program | Primary: Student in an Organized Health Care Education/Training Program

## 2024-03-12 DIAGNOSIS — Z8673 Personal history of transient ischemic attack (TIA), and cerebral infarction without residual deficits: Principal | ICD-10-CM

## 2024-03-12 MED ORDER — NICOTINE 21 MG/24 HR DAILY TRANSDERMAL PATCH
MEDICATED_PATCH | TRANSDERMAL | 0 refills | 28.00 days | Status: CP
Start: 2024-03-12 — End: 2024-04-09

## 2024-04-01 ENCOUNTER — Inpatient Hospital Stay: Admit: 2024-04-01 | Discharge: 2024-04-02

## 2024-04-06 ENCOUNTER — Ambulatory Visit: Admit: 2024-04-06 | Discharge: 2024-04-07

## 2024-04-06 DIAGNOSIS — Z8673 Personal history of transient ischemic attack (TIA), and cerebral infarction without residual deficits: Principal | ICD-10-CM

## 2024-04-21 DIAGNOSIS — Z8673 Personal history of transient ischemic attack (TIA), and cerebral infarction without residual deficits: Principal | ICD-10-CM

## 2024-04-22 ENCOUNTER — Inpatient Hospital Stay: Admit: 2024-04-22 | Discharge: 2024-04-23 | Payer: Medicaid (Managed Care)

## 2024-04-27 ENCOUNTER — Encounter: Admit: 2024-04-27 | Discharge: 2024-04-27 | Payer: Medicaid (Managed Care) | Attending: Family | Primary: Family

## 2024-04-28 ENCOUNTER — Encounter: Admit: 2024-04-28 | Discharge: 2024-04-28 | Payer: Medicaid (Managed Care) | Attending: Family | Primary: Family

## 2024-05-04 DIAGNOSIS — M19011 Primary osteoarthritis, right shoulder: Principal | ICD-10-CM

## 2024-05-04 DIAGNOSIS — M25511 Pain in right shoulder: Principal | ICD-10-CM

## 2024-06-23 ENCOUNTER — Encounter: Admit: 2024-06-23 | Discharge: 2024-06-24 | Payer: Medicaid (Managed Care)

## 2024-06-23 ENCOUNTER — Ambulatory Visit: Admit: 2024-06-23 | Discharge: 2024-06-24 | Payer: Medicaid (Managed Care)

## 2024-06-23 DIAGNOSIS — B360 Pityriasis versicolor: Principal | ICD-10-CM

## 2024-06-23 DIAGNOSIS — M359 Systemic involvement of connective tissue, unspecified: Principal | ICD-10-CM

## 2024-06-25 DIAGNOSIS — N3000 Acute cystitis without hematuria: Principal | ICD-10-CM

## 2024-06-25 MED ORDER — NITROFURANTOIN MONOHYDRATE/MACROCRYSTALS 100 MG CAPSULE
ORAL_CAPSULE | Freq: Two times a day (BID) | ORAL | 0 refills | 7.00000 days | Status: CP
Start: 2024-06-25 — End: 2024-07-02

## 2024-06-30 MED ORDER — HYDROXYCHLOROQUINE 200 MG TABLET
ORAL_TABLET | Freq: Every day | ORAL | 11 refills | 30.00000 days | Status: CP
Start: 2024-06-30 — End: 2025-06-30

## 2024-07-20 DIAGNOSIS — N390 Urinary tract infection, site not specified: Principal | ICD-10-CM

## 2024-07-20 DIAGNOSIS — M359 Systemic involvement of connective tissue, unspecified: Principal | ICD-10-CM

## 2024-07-20 DIAGNOSIS — Z79899 Other long term (current) drug therapy: Principal | ICD-10-CM

## 2024-07-30 ENCOUNTER — Encounter: Admit: 2024-07-30 | Discharge: 2024-07-30 | Payer: Medicaid (Managed Care)

## 2024-07-30 DIAGNOSIS — N390 Urinary tract infection, site not specified: Principal | ICD-10-CM

## 2024-07-30 DIAGNOSIS — N393 Stress incontinence (female) (male): Principal | ICD-10-CM

## 2024-07-30 MED ORDER — NITROFURANTOIN MONOHYDRATE/MACROCRYSTALS 100 MG CAPSULE
ORAL_CAPSULE | Freq: Every day | ORAL | 2 refills | 30.00000 days | Status: CP
Start: 2024-07-30 — End: 2024-08-06

## 2024-09-15 DIAGNOSIS — E782 Mixed hyperlipidemia: Principal | ICD-10-CM

## 2024-09-15 DIAGNOSIS — F17200 Nicotine dependence, unspecified, uncomplicated: Principal | ICD-10-CM

## 2024-09-15 DIAGNOSIS — R0789 Other chest pain: Principal | ICD-10-CM

## 2024-09-15 DIAGNOSIS — I10 Essential (primary) hypertension: Principal | ICD-10-CM

## 2024-09-15 DIAGNOSIS — R0609 Other forms of dyspnea: Principal | ICD-10-CM

## 2024-10-12 ENCOUNTER — Ambulatory Visit: Admit: 2024-10-12 | Discharge: 2024-10-12 | Payer: Medicaid (Managed Care)

## 2024-10-12 ENCOUNTER — Ambulatory Visit: Admit: 2024-10-12 | Discharge: 2024-10-12 | Payer: Medicaid (Managed Care) | Attending: Family | Primary: Family

## 2024-10-12 DIAGNOSIS — R202 Paresthesia of skin: Principal | ICD-10-CM

## 2024-10-12 DIAGNOSIS — R29898 Other symptoms and signs involving the musculoskeletal system: Principal | ICD-10-CM

## 2024-10-12 DIAGNOSIS — R531 Weakness: Principal | ICD-10-CM

## 2024-10-12 DIAGNOSIS — M545 Low back pain, unspecified back pain laterality, unspecified chronicity, unspecified whether sciatica present: Principal | ICD-10-CM

## 2024-10-12 DIAGNOSIS — R296 Repeated falls: Principal | ICD-10-CM

## 2024-10-12 DIAGNOSIS — R200 Anesthesia of skin: Principal | ICD-10-CM

## 2024-10-12 DIAGNOSIS — R519 Left facial pain: Principal | ICD-10-CM

## 2024-10-12 DIAGNOSIS — Z8673 Personal history of transient ischemic attack (TIA), and cerebral infarction without residual deficits: Principal | ICD-10-CM

## 2024-10-12 MED ORDER — BACLOFEN 10 MG TABLET
ORAL_TABLET | Freq: Once | ORAL | 0 refills | 0.00000 days | Status: CP | PRN
Start: 2024-10-12 — End: ?

## 2024-10-21 ENCOUNTER — Ambulatory Visit: Admit: 2024-10-21 | Discharge: 2024-10-21 | Payer: Medicaid (Managed Care)

## 2024-10-21 DIAGNOSIS — T50905A Adverse effect of unspecified drugs, medicaments and biological substances, initial encounter: Principal | ICD-10-CM

## 2024-10-21 DIAGNOSIS — L732 Hidradenitis suppurativa: Principal | ICD-10-CM

## 2024-10-21 DIAGNOSIS — M359 Systemic involvement of connective tissue, unspecified: Principal | ICD-10-CM

## 2024-10-21 DIAGNOSIS — L932 Other local lupus erythematosus: Principal | ICD-10-CM

## 2024-10-21 DIAGNOSIS — E559 Vitamin D deficiency, unspecified: Principal | ICD-10-CM

## 2024-10-21 MED ORDER — HYDROXYCHLOROQUINE 200 MG TABLET
ORAL_TABLET | Freq: Every day | ORAL | 11 refills | 30.00000 days | Status: CP
Start: 2024-10-21 — End: 2025-10-21

## 2024-10-21 MED ORDER — ERGOCALCIFEROL (VITAMIN D2) 1,250 MCG (50,000 UNIT) CAPSULE
ORAL_CAPSULE | ORAL | 2 refills | 28.00000 days | Status: CP
Start: 2024-10-21 — End: 2025-01-07

## 2024-10-27 ENCOUNTER — Inpatient Hospital Stay: Admit: 2024-10-27 | Discharge: 2024-10-28 | Payer: Medicaid (Managed Care)

## 2024-10-29 ENCOUNTER — Ambulatory Visit: Admit: 2024-10-29 | Discharge: 2024-10-30 | Payer: Medicaid (Managed Care)

## 2024-10-29 DIAGNOSIS — E279 Disorder of adrenal gland, unspecified: Principal | ICD-10-CM

## 2024-10-29 DIAGNOSIS — N390 Urinary tract infection, site not specified: Principal | ICD-10-CM

## 2024-10-29 DIAGNOSIS — N393 Stress incontinence (female) (male): Principal | ICD-10-CM

## 2024-10-30 ENCOUNTER — Ambulatory Visit: Admit: 2024-10-30 | Discharge: 2024-10-31 | Payer: Medicaid (Managed Care)

## 2024-10-30 DIAGNOSIS — E279 Disorder of adrenal gland, unspecified: Principal | ICD-10-CM

## 2024-11-16 ENCOUNTER — Ambulatory Visit: Admit: 2024-11-16 | Payer: Medicaid (Managed Care)

## 2024-11-16 ENCOUNTER — Ambulatory Visit: Admit: 2024-11-16 | Discharge: 2024-12-15 | Payer: Medicaid (Managed Care)

## 2024-11-16 DIAGNOSIS — R531 Weakness: Principal | ICD-10-CM

## 2024-11-16 DIAGNOSIS — M545 Low back pain, unspecified back pain laterality, unspecified chronicity, unspecified whether sciatica present: Principal | ICD-10-CM

## 2024-11-16 DIAGNOSIS — R202 Paresthesia of skin: Principal | ICD-10-CM

## 2024-11-16 DIAGNOSIS — R296 Repeated falls: Principal | ICD-10-CM

## 2024-11-16 DIAGNOSIS — R29898 Other symptoms and signs involving the musculoskeletal system: Principal | ICD-10-CM

## 2024-11-16 DIAGNOSIS — R200 Anesthesia of skin: Principal | ICD-10-CM

## 2024-11-18 DIAGNOSIS — R202 Paresthesia of skin: Principal | ICD-10-CM

## 2024-11-18 DIAGNOSIS — R200 Anesthesia of skin: Principal | ICD-10-CM

## 2024-11-23 DIAGNOSIS — R296 Repeated falls: Principal | ICD-10-CM

## 2024-11-23 DIAGNOSIS — R531 Weakness: Principal | ICD-10-CM

## 2024-11-30 DIAGNOSIS — R200 Anesthesia of skin: Principal | ICD-10-CM

## 2024-11-30 DIAGNOSIS — R296 Repeated falls: Principal | ICD-10-CM

## 2024-11-30 DIAGNOSIS — R202 Paresthesia of skin: Principal | ICD-10-CM

## 2024-11-30 DIAGNOSIS — R531 Weakness: Principal | ICD-10-CM

## 2024-12-02 DIAGNOSIS — R531 Weakness: Principal | ICD-10-CM

## 2024-12-02 DIAGNOSIS — R296 Repeated falls: Principal | ICD-10-CM

## 2024-12-03 ENCOUNTER — Inpatient Hospital Stay: Admit: 2024-12-03 | Discharge: 2024-12-03 | Payer: Medicaid (Managed Care)

## 2024-12-07 DIAGNOSIS — R531 Weakness: Principal | ICD-10-CM

## 2024-12-07 DIAGNOSIS — R202 Paresthesia of skin: Principal | ICD-10-CM

## 2024-12-07 DIAGNOSIS — R200 Anesthesia of skin: Principal | ICD-10-CM

## 2024-12-09 DIAGNOSIS — R531 Weakness: Principal | ICD-10-CM

## 2024-12-09 DIAGNOSIS — R296 Repeated falls: Principal | ICD-10-CM

## 2024-12-14 DIAGNOSIS — R296 Repeated falls: Principal | ICD-10-CM

## 2024-12-14 DIAGNOSIS — R200 Anesthesia of skin: Principal | ICD-10-CM

## 2024-12-14 DIAGNOSIS — R531 Weakness: Principal | ICD-10-CM

## 2024-12-14 DIAGNOSIS — R202 Paresthesia of skin: Principal | ICD-10-CM

## 2024-12-16 ENCOUNTER — Ambulatory Visit: Admit: 2024-12-16 | Payer: Medicaid (Managed Care)

## 2024-12-16 DIAGNOSIS — R296 Repeated falls: Principal | ICD-10-CM

## 2024-12-16 DIAGNOSIS — R531 Weakness: Principal | ICD-10-CM

## 2024-12-16 DIAGNOSIS — R200 Anesthesia of skin: Principal | ICD-10-CM

## 2024-12-16 DIAGNOSIS — R202 Paresthesia of skin: Principal | ICD-10-CM

## 2024-12-28 DIAGNOSIS — R531 Weakness: Principal | ICD-10-CM

## 2024-12-28 DIAGNOSIS — R296 Repeated falls: Principal | ICD-10-CM

## 2024-12-28 DIAGNOSIS — R202 Paresthesia of skin: Principal | ICD-10-CM

## 2024-12-28 DIAGNOSIS — R200 Anesthesia of skin: Principal | ICD-10-CM

## 2024-12-30 DIAGNOSIS — R531 Weakness: Principal | ICD-10-CM

## 2024-12-30 DIAGNOSIS — R296 Repeated falls: Principal | ICD-10-CM

## 2024-12-30 DIAGNOSIS — R200 Anesthesia of skin: Principal | ICD-10-CM

## 2024-12-30 DIAGNOSIS — R202 Paresthesia of skin: Principal | ICD-10-CM

## 2025-01-22 DIAGNOSIS — N393 Stress incontinence (female) (male): Secondary | ICD-10-CM

## 2025-01-22 DIAGNOSIS — R339 Retention of urine, unspecified: Principal | ICD-10-CM

## 2025-02-01 DIAGNOSIS — N393 Stress incontinence (female) (male): Secondary | ICD-10-CM

## 2025-02-01 DIAGNOSIS — R339 Retention of urine, unspecified: Principal | ICD-10-CM

## 2025-02-03 ENCOUNTER — Encounter
Admit: 2025-02-03 | Discharge: 2025-02-04 | Payer: Medicaid (Managed Care) | Attending: Internal Medicine | Primary: Internal Medicine

## 2025-02-03 DIAGNOSIS — T50905A Adverse effect of unspecified drugs, medicaments and biological substances, initial encounter: Secondary | ICD-10-CM

## 2025-02-03 DIAGNOSIS — L732 Hidradenitis suppurativa: Secondary | ICD-10-CM

## 2025-02-03 DIAGNOSIS — Z79899 Other long term (current) drug therapy: Principal | ICD-10-CM

## 2025-02-03 DIAGNOSIS — L932 Other local lupus erythematosus: Secondary | ICD-10-CM

## 2025-02-03 DIAGNOSIS — E538 Deficiency of other specified B group vitamins: Secondary | ICD-10-CM

## 2025-02-03 DIAGNOSIS — E539 Vitamin B deficiency, unspecified: Secondary | ICD-10-CM

## 2025-02-03 DIAGNOSIS — E559 Vitamin D deficiency, unspecified: Secondary | ICD-10-CM

## 2025-02-03 MED ORDER — VITAMIN B COMPLEX CAPSULE
ORAL_CAPSULE | Freq: Every day | ORAL | 3 refills | 90.00000 days | Status: CP
Start: 2025-02-03 — End: 2026-02-03
